# Patient Record
Sex: Female | Born: 1945 | Race: White | Hispanic: No | Marital: Married | Smoking: Former smoker
Health system: Southern US, Community
[De-identification: ages and names within clinical notes are randomized; demographics above are authoritative.]

## PROBLEM LIST (undated history)

## (undated) DIAGNOSIS — E119 Type 2 diabetes mellitus without complications: Secondary | ICD-10-CM

## (undated) DIAGNOSIS — I10 Essential (primary) hypertension: Secondary | ICD-10-CM

## (undated) DIAGNOSIS — Z9289 Personal history of other medical treatment: Secondary | ICD-10-CM

## (undated) DIAGNOSIS — R079 Chest pain, unspecified: Secondary | ICD-10-CM

## (undated) DIAGNOSIS — I447 Left bundle-branch block, unspecified: Secondary | ICD-10-CM

## (undated) DIAGNOSIS — E785 Hyperlipidemia, unspecified: Secondary | ICD-10-CM

## (undated) HISTORY — DX: Essential (primary) hypertension: I10

## (undated) HISTORY — DX: Type 2 diabetes mellitus without complications: E11.9

## (undated) HISTORY — PX: OTHER SURGICAL HISTORY: SHX169

## (undated) HISTORY — DX: Hyperlipidemia, unspecified: E78.5

---

## 2004-11-02 ENCOUNTER — Ambulatory Visit: Payer: Self-pay | Admitting: Internal Medicine

## 2005-05-23 ENCOUNTER — Ambulatory Visit: Payer: Self-pay | Admitting: Internal Medicine

## 2006-04-21 ENCOUNTER — Ambulatory Visit: Payer: Self-pay | Admitting: Gastroenterology

## 2006-05-26 ENCOUNTER — Ambulatory Visit: Payer: Self-pay | Admitting: Internal Medicine

## 2007-05-20 ENCOUNTER — Ambulatory Visit: Payer: Self-pay | Admitting: Cardiovascular Disease

## 2007-05-21 ENCOUNTER — Ambulatory Visit: Payer: Self-pay | Admitting: Cardiovascular Disease

## 2009-07-26 ENCOUNTER — Ambulatory Visit: Payer: Self-pay | Admitting: Internal Medicine

## 2010-08-17 ENCOUNTER — Ambulatory Visit: Payer: Self-pay | Admitting: Internal Medicine

## 2011-07-12 ENCOUNTER — Ambulatory Visit: Payer: Self-pay | Admitting: Dermatology

## 2011-08-20 ENCOUNTER — Ambulatory Visit: Payer: Self-pay | Admitting: Internal Medicine

## 2012-08-25 ENCOUNTER — Ambulatory Visit: Payer: Self-pay | Admitting: Internal Medicine

## 2013-06-23 ENCOUNTER — Encounter: Payer: Self-pay | Admitting: Cardiology

## 2013-06-24 NOTE — Progress Notes (Deleted)
   Patient ID: [name], female    DOB: 1946-04-28, 67 y.o.   MRN: [id number]  HPI    Review of Systems    Physical Exam

## 2013-06-24 NOTE — Progress Notes (Signed)
Patient cancelled, erroneous encounter This encounter was created in error - please disregard.

## 2013-07-09 ENCOUNTER — Other Ambulatory Visit: Payer: Medicare Other | Admitting: Cardiology

## 2013-07-27 ENCOUNTER — Other Ambulatory Visit (INDEPENDENT_AMBULATORY_CARE_PROVIDER_SITE_OTHER): Payer: Medicare Other

## 2013-07-27 ENCOUNTER — Other Ambulatory Visit: Payer: Self-pay

## 2013-07-27 DIAGNOSIS — R0602 Shortness of breath: Secondary | ICD-10-CM

## 2013-07-27 DIAGNOSIS — R079 Chest pain, unspecified: Secondary | ICD-10-CM

## 2013-07-29 ENCOUNTER — Telehealth: Payer: Self-pay | Admitting: Cardiovascular Disease

## 2013-08-02 NOTE — Telephone Encounter (Signed)
Echo Report Faxed 10/23 Dr . Dario Guardian office

## 2013-08-03 ENCOUNTER — Telehealth: Payer: Self-pay

## 2013-08-03 NOTE — Telephone Encounter (Signed)
Message copied by Marilynne Halsted on Tue Aug 03, 2013 11:37 AM ------      Message from: [name]      Created: Mon Aug 02, 2013  5:55 PM       Essentially normal echocardiogram, mild abnormalities       nothing serious ------

## 2013-08-04 NOTE — Telephone Encounter (Signed)
Message copied by Marilynne Halsted on Wed Aug 04, 2013  1:58 PM ------      Message from: Antonieta Iba      Created: Mon Aug 02, 2013  5:55 PM       Essentially normal echocardiogram, mild abnormalities       nothing serious ------

## 2013-08-04 NOTE — Telephone Encounter (Signed)
lmom 

## 2014-09-27 ENCOUNTER — Ambulatory Visit: Payer: Self-pay | Admitting: Internal Medicine

## 2015-01-31 ENCOUNTER — Ambulatory Visit
Admit: 2015-01-31 | Disposition: A | Discharge status: Home / Self Care | Payer: Self-pay | Attending: Internal Medicine | Admitting: Internal Medicine

## 2015-11-06 ENCOUNTER — Encounter: Payer: Medicare HMO | Attending: Surgery | Admitting: Surgery

## 2015-11-06 DIAGNOSIS — G629 Polyneuropathy, unspecified: Secondary | ICD-10-CM | POA: Insufficient documentation

## 2015-11-06 DIAGNOSIS — E785 Hyperlipidemia, unspecified: Secondary | ICD-10-CM | POA: Diagnosis not present

## 2015-11-06 DIAGNOSIS — E559 Vitamin D deficiency, unspecified: Secondary | ICD-10-CM | POA: Diagnosis not present

## 2015-11-06 DIAGNOSIS — I501 Left ventricular failure: Secondary | ICD-10-CM | POA: Diagnosis not present

## 2015-11-06 DIAGNOSIS — E1165 Type 2 diabetes mellitus with hyperglycemia: Secondary | ICD-10-CM | POA: Insufficient documentation

## 2015-11-06 DIAGNOSIS — I10 Essential (primary) hypertension: Secondary | ICD-10-CM | POA: Insufficient documentation

## 2015-11-06 DIAGNOSIS — I87311 Chronic venous hypertension (idiopathic) with ulcer of right lower extremity: Secondary | ICD-10-CM | POA: Insufficient documentation

## 2015-11-06 DIAGNOSIS — Z87891 Personal history of nicotine dependence: Secondary | ICD-10-CM | POA: Insufficient documentation

## 2015-11-06 DIAGNOSIS — E11622 Type 2 diabetes mellitus with other skin ulcer: Secondary | ICD-10-CM | POA: Insufficient documentation

## 2015-11-06 DIAGNOSIS — L97312 Non-pressure chronic ulcer of right ankle with fat layer exposed: Secondary | ICD-10-CM | POA: Insufficient documentation

## 2015-11-07 NOTE — Progress Notes (Signed)
[name], [name] ([id number]) Visit Report for 11/06/2015 Allergy List Details Patient Name: [name], [name]. Date of Service: 11/06/2015 9:30 AM Medical Record Number: [id number] Patient Account Number: [id number] Date of Birth/Sex: 09/01/1946 (70 y.o. Female) Treating RN: Leonard Downing Primary Care Physician: Sherrie Mustache Other Clinician: Referring Physician: Sherrie Mustache Treating Physician/Extender: Rudene Re in Treatment: 0 Allergies Active Allergies no known allergies Allergy Notes Electronic Signature(s) Signed: 11/06/2015 4:43:04 PM By: Lucrezia Starch, RN, Sendra Entered By: Lucrezia Starch RN, Sendra on 11/06/2015 09:24:39 Nierman, [name] ([id number]) -------------------------------------------------------------------------------- Arrival Information Details Patient Name: [name] [name]. Date of Service: 11/06/2015 9:30 AM Medical Record Number: [id number] Patient Account Number: [id number] Date of Birth/Sex: 1946-04-25 (70 y.o. Female) Treating RN: Leonard Downing Primary Care Physician: Sherrie Mustache Other Clinician: Referring Physician: Sherrie Mustache Treating Physician/Extender: Rudene Re in Treatment: 0 Visit Information Patient Arrived: Ambulatory Arrival Time: 09:37 Accompanied By: self Transfer Assistance: None Patient Identification Verified: Yes Secondary Verification Process Yes Completed: Patient Requires Transmission- No Based Precautions: Patient Has Alerts: Yes Patient Alerts: 1/30 ABI (R) 0.93 Electronic Signature(s) Signed: 11/06/2015 4:43:04 PM By: Lucrezia Starch, RN, Sendra Entered By: Lucrezia Starch RN, Sendra on 11/06/2015 10:08:38 Stevenson Clinch ([id number]) -------------------------------------------------------------------------------- Clinic Level of Care Assessment Details Patient Name: [name] [name]. Date of Service: 11/06/2015 9:30 AM Medical Record Number: [id number] Patient Account Number: [id number] Date of Birth/Sex: April 30, 1946 (70  y.o. Female) Treating RN: Leonard Downing Primary Care Physician: Sherrie Mustache Other Clinician: Referring Physician: Sherrie Mustache Treating Physician/Extender: Rudene Re in Treatment: 0 Clinic Level of Care Assessment Items TOOL 1 Quantity Score X - Use when EandM and Procedure is performed on INITIAL visit 1 0 ASSESSMENTS - Nursing Assessment / Reassessment X - General Physical Exam (combine w/ comprehensive assessment (listed just 1 20 below) when performed on new pt. evals) X - Comprehensive Assessment (HX, ROS, Risk Assessments, Wounds Hx, etc.) 1 25 ASSESSMENTS - Wound and Skin Assessment / Reassessment []  - Dermatologic / Skin Assessment (not related to wound area) 0 ASSESSMENTS - Ostomy and/or Continence Assessment and Care []  - Incontinence Assessment and Management 0 []  - Ostomy Care Assessment and Management (repouching, etc.) 0 PROCESS - Coordination of Care X - Simple Patient / Family Education for ongoing care 1 15 []  - Complex (extensive) Patient / Family Education for ongoing care 0 X - Staff obtains Chiropractor, Records, Test Results / Process Orders 1 10 X - Staff telephones HHA, Nursing Homes / Clarify orders / etc 1 10 []  - Routine Transfer to another Facility (non-emergent condition) 0 []  - Routine Hospital Admission (non-emergent condition) 0 []  - New Admissions / Manufacturing engineer / Ordering NPWT, Apligraf, etc. 0 []  - Emergency Hospital Admission (emergent condition) 0 PROCESS - Special Needs []  - Pediatric / Minor Patient Management 0 []  - Isolation Patient Management 0 [name], [name]. ([id number]) []  - Hearing / Language / Visual special needs 0 []  - Assessment of Community assistance (transportation, D/C planning, etc.) 0 []  - Additional assistance / Altered mentation 0 []  - Support Surface(s) Assessment (bed, cushion, seat, etc.) 0 INTERVENTIONS - Miscellaneous []  - External ear exam 0 []  - Patient Transfer (multiple staff / Water quality scientist / Similar devices) 0 []  - Simple Staple / Suture removal (25 or less) 0 []  - Complex Staple / Suture removal (26 or more) 0 []  - Hypo/Hyperglycemic Management (do not check if billed separately) 0 X - Ankle / Brachial Index (ABI) - do not check if billed separately 1 15 Has the patient  been seen at the hospital within the last three years: Yes Total Score: 95 Level Of Care: New/Established - Level 3 Electronic Signature(s) Signed: 11/06/2015 4:43:04 PM By: Lucrezia Starch, RN, Sendra Entered By: Lucrezia Starch RN, Sendra on 11/06/2015 11:34:39 [name], [name] ([id number]) -------------------------------------------------------------------------------- Encounter Discharge Information Details Patient Name: [name] [name]. Date of Service: 11/06/2015 9:30 AM Medical Record Number: [id number] Patient Account Number: [id number] Date of Birth/Sex: April 21, 1946 (70 y.o. Female) Treating RN: Leonard Downing Primary Care Physician: Sherrie Mustache Other Clinician: Referring Physician: Sherrie Mustache Treating Physician/Extender: Rudene Re in Treatment: 0 Encounter Discharge Information Items Discharge Pain Level: 4 Discharge Condition: Stable Ambulatory Status: Ambulatory Discharge Destination: Home Transportation: Private Auto Accompanied By: self Schedule Follow-up Appointment: Yes Medication Reconciliation completed and provided to Patient/Care No Tyneshia Stivers: Provided on Clinical Summary of Care: 11/06/2015 Form Type Recipient Paper Patient SP Electronic Signature(s) Signed: 11/06/2015 4:43:04 PM By: Lucrezia Starch RN, Sendra Previous Signature: 11/06/2015 10:37:37 AM Version By: Gwenlyn Perking Entered By: Lucrezia Starch RN, Sendra on 11/06/2015 10:43:28 [name], [name] ([id number]) -------------------------------------------------------------------------------- Lower Extremity Assessment Details Patient Name: [name] [name]. Date of Service: 11/06/2015 9:30 AM Medical Record Number:  [id number] Patient Account Number: [id number] Date of Birth/Sex: August 24, 1946 (70 y.o. Female) Treating RN: Leonard Downing Primary Care Physician: Sherrie Mustache Other Clinician: Referring Physician: Sherrie Mustache Treating Physician/Extender: Rudene Re in Treatment: 0 Edema Assessment Assessed: [Left: No] [Right: No] Edema: [Left: Ye] [Right: s] Calf Left: Right: Point of Measurement: 36 cm From Medial Instep cm 41 cm Ankle Left: Right: Point of Measurement: 10 cm From Medial Instep cm 22.5 cm Vascular Assessment Pulses: Posterior Tibial Palpable: [Right:Yes] Dorsalis Pedis Palpable: [Right:Yes] Extremity colors, hair growth, and conditions: Extremity Color: [Right:Mottled] Hair Growth on Extremity: [Right:Yes] Temperature of Extremity: [Right:Warm] Capillary Refill: [Right:< 3 seconds] Dependent Rubor: [Right:No] Blanched when Elevated: [Right:No] Lipodermatosclerosis: [Right:No] Blood Pressure: Brachial: [Right:150] Dorsalis Pedis: [Left:Dorsalis Pedis: 140] Ankle: Posterior Tibial: [Left:Posterior Tibial:] [Right:0.93] Toe Nail Assessment Left: Right: Thick: No Discolored: No Deformed: No [name], [name]. ([id number]) Improper Length and Hygiene: No Electronic Signature(s) Signed: 11/06/2015 4:43:04 PM By: Lucrezia Starch, RN, Sendra Entered By: Lucrezia Starch RN, Sendra on 11/06/2015 10:17:34 [name], [name] ([id number]) -------------------------------------------------------------------------------- Multi Wound Chart Details Patient Name: [name] [name]. Date of Service: 11/06/2015 9:30 AM Medical Record Number: [id number] Patient Account Number: [id number] Date of Birth/Sex: Jul 17, 1946 (70 y.o. Female) Treating RN: Leonard Downing Primary Care Physician: Sherrie Mustache Other Clinician: Referring Physician: Sherrie Mustache Treating Physician/Extender: Rudene Re in Treatment: 0 Vital Signs Height(in): 70 Pulse(bpm): 87 Weight(lbs): 188 Blood  Pressure 150/62 (mmHg): Body Mass Index(BMI): 27 Temperature(F): 97.8 Respiratory Rate (breaths/min): Photos: [1:No Photos] [N/[name]:N/[name]] Wound Location: [1:Lower Leg - Lateral] [N/[name]:N/[name]] Wounding Event: [1:Gradually Appeared] [N/[name]:N/[name]] Primary Etiology: [1:Venous Leg Ulcer] [N/[name]:N/[name]] Secondary Etiology: [1:Diabetic Wound/Ulcer of the Lower Extremity] [N/[name]:N/[name]] Comorbid History: [1:Hypertension, Type II Diabetes, Neuropathy] [N/[name]:N/[name]] Date Acquired: [1:04/07/2015] [N/[name]:N/[name]] Weeks of Treatment: [1:0] [N/[name]:N/[name]] Wound Status: [1:Open] [N/[name]:N/[name]] Measurements L x W x D 1x0.5x0.1 [N/[name]:N/[name]] (cm) Area (cm) : [1:0.393] [N/[name]:N/[name]] Volume (cm) : [1:0.039] [N/[name]:N/[name]] Classification: [1:Partial Thickness] [N/[name]:N/[name]] HBO Classification: [1:Grade 1] [N/[name]:N/[name]] Exudate Amount: [1:Medium] [N/[name]:N/[name]] Exudate Type: [1:Serosanguineous] [N/[name]:N/[name]] Exudate Color: [1:red, brown] [N/[name]:N/[name]] Wound Margin: [1:Flat and Intact] [N/[name]:N/[name]] Granulation Amount: [1:Small (1-33%)] [N/[name]:N/[name]] Granulation Quality: [1:Red] [N/[name]:N/[name]] Necrotic Amount: [1:Large (67-100%)] [N/[name]:N/[name]] Exposed Structures: [1:Fascia: No Fat: No Tendon: No Muscle: No Joint: No Bone: No] [N/[name]:N/[name]] Limited to Skin Breakdown Epithelialization: None N/[name] N/[name] Periwound Skin Texture: Edema: No N/[name] N/[name] Excoriation: No Induration: No Callus: No Crepitus: No Fluctuance:  No Friable: No Rash: No Scarring: No Periwound Skin Moist: Yes N/[name] N/[name] Moisture: Maceration: No Dry/Scaly: No Periwound Skin Color: Ecchymosis: Yes N/[name] N/[name] Erythema: Yes Atrophie Blanche: No Cyanosis: No Hemosiderin Staining: No Mottled: No Pallor: No Rubor: No Erythema Location: Circumferential N/[name] N/[name] Tenderness on Yes N/[name] N/[name] Palpation: Wound Preparation: Ulcer [name]: N/[name] N/[name] Rinsed/Irrigated with Saline Topical Anesthetic Applied: Other: lidocaine 4% Treatment Notes Electronic Signature(s) Signed: 11/06/2015 4:43:04 PM By: Lucrezia Starch, RN, Sendra Entered By:  Lucrezia Starch RN, Sendra on 11/06/2015 10:13:31 [name], [name] ([id number]) -------------------------------------------------------------------------------- Multi-Disciplinary Care Plan Details Patient Name: [name] [name]. Date of Service: 11/06/2015 9:30 AM Medical Record Number: [id number] Patient Account Number: [id number] Date of Birth/Sex: 1946/09/15 (70 y.o. Female) Treating RN: Leonard Downing Primary Care Physician: Sherrie Mustache Other Clinician: Referring Physician: Sherrie Mustache Treating Physician/Extender: Rudene Re in Treatment: 0 Active Inactive Orientation to the Wound Care Program Nursing Diagnoses: Knowledge deficit related to the wound healing center program Goals: Patient/caregiver will verbalize understanding of the Wound Healing Center Program Date Initiated: 11/06/2015 Goal Status: Active Interventions: Provide education on orientation to the wound center Notes: Venous Leg Ulcer Nursing Diagnoses: Potential for venous Insuffiency (use before diagnosis confirmed) Goals: Non-invasive venous studies are completed as ordered Date Initiated: 11/06/2015 Goal Status: Active Patient will maintain optimal edema control Date Initiated: 11/06/2015 Goal Status: Active Patient/caregiver will verbalize understanding of disease process and disease management Date Initiated: 11/06/2015 Goal Status: Active Verify adequate tissue perfusion prior to therapeutic compression application Date Initiated: 11/06/2015 Goal Status: Active Interventions: Assess peripheral edema status every visit. Compression as ordered [name], [name]. ([id number]) Provide education on venous insufficiency Treatment Activities: Non-invasive vascular studies : 11/06/2015 Test ordered outside of clinic : 11/06/2015 Therapeutic compression applied : 11/06/2015 Notes: Wound/Skin Impairment Nursing Diagnoses: Impaired tissue integrity Goals: Patient/caregiver will verbalize understanding of  skin care regimen Date Initiated: 11/06/2015 Goal Status: Active Ulcer/skin breakdown will have [name] volume reduction of 30% by week 4 Date Initiated: 11/06/2015 Goal Status: Active Ulcer/skin breakdown will have [name] volume reduction of 50% by week 8 Date Initiated: 11/06/2015 Goal Status: Active Ulcer/skin breakdown will have [name] volume reduction of 80% by week 12 Date Initiated: 11/06/2015 Goal Status: Active Ulcer/skin breakdown will heal within 14 weeks Date Initiated: 11/06/2015 Goal Status: Active Interventions: Assess patient/caregiver ability to perform ulcer/skin care regimen upon admission and as needed Assess ulceration(s) every visit Provide education on ulcer and skin care Notes: Electronic Signature(s) Signed: 11/06/2015 4:43:04 PM By: Lucrezia Starch, RN, Sendra Entered By: Lucrezia Starch RN, Sendra on 11/06/2015 10:13:17 [name], [name] ([id number]) -------------------------------------------------------------------------------- Pain Assessment Details Patient Name: [name] [name]. Date of Service: 11/06/2015 9:30 AM Medical Record Number: [id number] Patient Account Number: [id number] Date of Birth/Sex: 1945/11/03 (70 y.o. Female) Treating RN: Leonard Downing Primary Care Physician: Sherrie Mustache Other Clinician: Referring Physician: Sherrie Mustache Treating Physician/Extender: Rudene Re in Treatment: 0 Active Problems Location of Pain Severity and Description of Pain Patient Has Paino Yes Site Locations Pain Location: Pain in Ulcers With Dressing Change: Yes Duration of the Pain. Constant / Intermittento Intermittent Rate the pain. Current Pain Level: 10 Pain Management and Medication Current Pain Management: Electronic Signature(s) Signed: 11/06/2015 4:43:04 PM By: Lucrezia Starch, RN, Sendra Entered By: Lucrezia Starch RN, Sendra on 11/06/2015 09:37:48 [name], [name] ([id number]) -------------------------------------------------------------------------------- Patient/Caregiver  Education Details Patient Name: [name] [name]. Date of Service: 11/06/2015 9:30 AM Medical Record Number: [id number] Patient Account Number: [id number] Date of Birth/Gender: 12/08/1945 (70 y.o. Female) Treating RN: Leonard Downing Primary Care Physician: Dario Guardian,  [facility] Other Clinician: Referring Physician: Sherrie Mustache Treating Physician/Extender: Rudene Re in Treatment: 0 Education Assessment Education Provided To: Patient Education Topics Provided Venous: Handouts: Controlling Swelling with Compression Stockings , Other: AVV venous duplex Methods: Explain/Verbal Responses: State content correctly Welcome To The Wound Care Center: Handouts: Welcome To The Wound Care Center Methods: Explain/Verbal Responses: State content correctly Wound/Skin Impairment: Handouts: Caring for Your Ulcer, Skin Care Do's and Dont's Methods: Explain/Verbal Responses: State content correctly Electronic Signature(s) Signed: 11/06/2015 4:43:04 PM By: Lucrezia Starch, RN, Sendra Entered By: Lucrezia Starch RN, Sendra on 11/06/2015 10:43:59 [name], [name] ([id number]) -------------------------------------------------------------------------------- Wound Assessment Details Patient Name: [name] [name]. Date of Service: 11/06/2015 9:30 AM Medical Record Number: [id number] Patient Account Number: [id number] Date of Birth/Sex: 12-01-1945 (70 y.o. Female) Treating RN: Leonard Downing Primary Care Physician: Sherrie Mustache Other Clinician: Referring Physician: Sherrie Mustache Treating Physician/Extender: Rudene Re in Treatment: 0 Wound Status Wound Number: 1 Primary Etiology: Venous Leg Ulcer Wound Location: Lower Leg - Lateral Secondary Diabetic Wound/Ulcer of the Lower Etiology: Extremity Wounding Event: Gradually Appeared Wound Status: Open Date Acquired: 04/07/2015 Comorbid Hypertension, Type II Diabetes, Weeks Of Treatment: 0 History: Neuropathy Clustered Wound: No Photos Photo Uploaded  By: Lucrezia Starch, RN, Rosalio Macadamia on 11/06/2015 12:45:09 Wound Measurements Length: (cm) 1 Width: (cm) 0.5 Depth: (cm) 0.1 Area: (cm) 0.393 Volume: (cm) 0.039 % Reduction in Area: % Reduction in Volume: Epithelialization: None Tunneling: No Undermining: No Wound Description Classification: Partial Thickness Foul Odor Af Diabetic Severity (Wagner): Grade 1 Wound Margin: Flat and Intact Exudate Amount: Medium Exudate Type: Serosanguineous Exudate Color: red, brown [name]: No Wound Bed Granulation Amount: Small (1-33%) Exposed Structure Granulation Quality: Red Fascia Exposed: No Necrotic Amount: Large (67-100%) Fat Layer Exposed: No Kerins, [name]. ([id number]) Necrotic Quality: Adherent Slough Tendon Exposed: No Muscle Exposed: No Joint Exposed: No Bone Exposed: No Limited to Skin Breakdown Periwound Skin Texture Texture Color No Abnormalities Noted: No No Abnormalities Noted: No Callus: No Atrophie Blanche: No Crepitus: No Cyanosis: No Excoriation: No Ecchymosis: Yes Fluctuance: No Erythema: Yes Friable: No Erythema Location: Circumferential Induration: No Hemosiderin Staining: No Localized Edema: No Mottled: No Rash: No Pallor: No Scarring: No Rubor: No Moisture Temperature / Pain No Abnormalities Noted: No Tenderness on Palpation: Yes Dry / Scaly: No Maceration: No Moist: Yes Wound Preparation Ulcer [name]: Rinsed/Irrigated with Saline Topical Anesthetic Applied: Other: lidocaine 4%, Treatment Notes Wound #1 (Lateral Lower Leg) 1. Cleansed with: Clean wound with Normal Saline 2. Anesthetic Topical Lidocaine 4% cream to wound bed prior to debridement 4. Dressing Applied: Aquacel Ag 5. Secondary Dressing Applied Bordered Foam Dressing Notes AVV consult, obtain dermatology notes from Dr Adolphus Birchwood Electronic Signature(s) Signed: 11/06/2015 4:43:04 PM By: Lucrezia Starch RN, Sendra Entered By: Lucrezia Starch RN, Sendra on 11/06/2015 09:56:25 Ding,  [name] ([id number]) -------------------------------------------------------------------------------- Vitals Details Patient Name: [name] [name]. Date of Service: 11/06/2015 9:30 AM Medical Record Number: [id number] Patient Account Number: [id number] Date of Birth/Sex: 1945-12-20 (70 y.o. Female) Treating RN: Leonard Downing Primary Care Physician: Sherrie Mustache Other Clinician: Referring Physician: Sherrie Mustache Treating Physician/Extender: Rudene Re in Treatment: 0 Vital Signs Time Taken: 09:38 Temperature (F): 97.8 Height (in): 70 Pulse (bpm): 87 Source: Stated Blood Pressure (mmHg): 150/62 Weight (lbs): 188 Reference Range: 80 - 120 mg / dl Source: Stated Body Mass Index (BMI): 27 Notes Hemoglobin A1c checked on Friday, waiting on results Electronic Signature(s) Signed: 11/06/2015 4:43:04 PM By: Lucrezia Starch RN, Sendra Entered By: Lucrezia Starch RN, Sendra on 11/06/2015 09:41:04

## 2015-11-07 NOTE — Progress Notes (Addendum)
SHAKEYA, KERKMAN ([id number]) Visit Report for 11/06/2015 Chief Complaint Document Details Patient Name: [name], [name] Date of Service: 11/06/2015 9:30 AM Medical Record Number: [id number] Patient Account Number: [id number] Date of Birth/Sex: December 30, 1945 (70 y.o. Female) Treating RN: Leonard Downing Primary Care Physician: Sherrie Mustache Other Clinician: Referring Physician: Sherrie Mustache Treating Physician/Extender: Rudene Re in Treatment: 0 Information Obtained from: Patient Chief Complaint Patients presents for treatment of an open diabetic ulcer to the right lateral lower third of the leg which she's had for about 6 months Electronic Signature(s) Signed: 11/06/2015 10:53:32 AM By: Evlyn Kanner MD, FACS Entered By: Evlyn Kanner on 11/06/2015 10:53:32 [name], [name] ([id number]) -------------------------------------------------------------------------------- Debridement Details Patient Name: [name] A. Date of Service: 11/06/2015 9:30 AM Medical Record Number: [id number] Patient Account Number: [id number] Date of Birth/Sex: 03/01/1946 (70 y.o. Female) Treating RN: Leonard Downing Primary Care Physician: Sherrie Mustache Other Clinician: Referring Physician: Sherrie Mustache Treating Physician/Extender: Rudene Re in Treatment: 0 Debridement Performed for Wound #1 Lateral Lower Leg Assessment: Performed By: Physician Evlyn Kanner, MD Debridement: Debridement Pre-procedure Yes Verification/Time Out Taken: Start Time: 10:23 Pain Control: Lidocaine 4% Topical Solution Level: Skin/Subcutaneous Tissue Total Area Debrided (L x 1 (cm) x 0.5 (cm) = 0.5 (cm) W): Tissue and other Viable, Non-Viable, Fibrin/Slough, Skin, Subcutaneous material debrided: Instrument: Curette Bleeding: None End Time: 10:24 Procedural Pain: 4 Post Procedural Pain: 4 Response to Treatment: Procedure was tolerated well Post Debridement Measurements of Total Wound Length: (cm)  1 Width: (cm) 0.5 Depth: (cm) 0.1 Volume: (cm) 0.039 Post Procedure Diagnosis Same as Pre-procedure Electronic Signature(s) Signed: 11/06/2015 10:53:10 AM By: Evlyn Kanner MD, FACS Signed: 11/06/2015 4:43:04 PM By: Lucrezia Starch RN, Sendra Entered By: Evlyn Kanner on 11/06/2015 10:53:09 [name], [name] Kitchen ([id number]) -------------------------------------------------------------------------------- HPI Details Patient Name: [name] A. Date of Service: 11/06/2015 9:30 AM Medical Record Number: [id number] Patient Account Number: [id number] Date of Birth/Sex: 1946-03-28 (70 y.o. Female) Treating RN: Leonard Downing Primary Care Physician: Sherrie Mustache Other Clinician: Referring Physician: Sherrie Mustache Treating Physician/Extender: Rudene Re in Treatment: 0 History of Present Illness Location: right lateral lower leg Quality: Patient reports experiencing a dull pain to affected area(s). Severity: Patient states wound are getting worse. Duration: Patient has had the wound for > 6 months prior to seeking treatment at the wound center Timing: Pain in wound is Intermittent (comes and goes Context: The wound appeared gradually over time Modifying Factors: Other treatment(s) tried include: her dermatologist Dr. Adolphus Birchwood has been treating her with local care and some pentoxifylline Associated Signs and Symptoms: Patient reports having increase swelling. HPI Description: 70 year old patient with known history of uncontrolled type 2 diabetes mellitus also has a small ulcer on the right ankle and was referred to as by her PCP. 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 She has given up smoking over 15 years ago. the patient has been under the care of her dermatologist Dr. Adolphus Birchwood who has done a biopsy for her sometime in October  and said there was no cancer. We will try and get these reports from his office and the other workup and treatment he's been doing can be noted. Addendum: received the pathology report from Dr. 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 This atypia was considered reactive. Electronic Signature(s)  Signed: 11/06/2015 4:46:13 PM By: Evlyn Kanner MD, FACS Previous Signature: 11/06/2015 10:55:13 AM Version By: Evlyn Kanner MD, FACS Previous Signature: 11/06/2015 9:54:29 AM Version By: Evlyn Kanner MD, FACS Previous Signature: 11/06/2015 9:47:16 AM Version By: Evlyn Kanner MD, FACS Entered By: Evlyn Kanner on 11/06/2015 16:46:13 [name], [name] ([id number]) -------------------------------------------------------------------------------- Physical Exam Details Patient Name: [name] A. Date of Service: 11/06/2015 9:30 AM Medical Record Number: [id number] Patient Account Number: [id number] Date of Birth/Sex: 05-08-1946 (70 y.o. Female) Treating RN: Leonard Downing Primary Care Physician: Sherrie Mustache Other Clinician: Referring Physician: Sherrie Mustache Treating Physician/Extender: Rudene Re in Treatment: 0 Constitutional . Pulse regular. Respirations normal and unlabored. Afebrile. . Eyes Nonicteric. Reactive to light. Ears, Nose, Mouth, and Throat Lips, teeth, and gums WNL.[name] Kitchen Moist mucosa without lesions. Neck supple and nontender. No palpable supraclavicular or cervical adenopathy. Normal sized without goiter. Respiratory WNL. No retractions.. Cardiovascular Pedal Pulses WNL. ABI on the right lower extremity was 0.93. No clubbing, cyanosis or edema. Gastrointestinal (GI) Abdomen without masses or tenderness.. No liver or spleen enlargement or tenderness.. Lymphatic No adneopathy. No adenopathy. No  adenopathy. Musculoskeletal Adexa without tenderness or enlargement.. Digits and nails w/o clubbing, cyanosis, infection, petechiae, ischemia, or inflammatory conditions.. Integumentary (Hair, Skin) No suspicious lesions. No crepitus or fluctuance. No peri-wound warmth or erythema. No masses.[name] Kitchen Psychiatric Judgement and insight Intact.. No evidence of depression, anxiety, or agitation.. Notes she has an ulcerated area on the right lateral lower extremity about a few inches above her lateral malleolus. She has some raised skin and this appears to be a chronic skin condition which has previously been biopsied. Electronic Signature(s) Signed: 11/06/2015 10:56:11 AM By: Evlyn Kanner MD, FACS Entered By: Evlyn Kanner on 11/06/2015 10:56:11 [name], [name] ([id number]) -------------------------------------------------------------------------------- Physician Orders Details Patient Name: [name] A. Date of Service: 11/06/2015 9:30 AM Medical Record Number: [id number] Patient Account Number: [id number] Date of Birth/Sex: [birth year] (70 y.o. Female) Treating RN: Leonard Downing Primary Care Physician: Sherrie Mustache Other Clinician: Referring Physician: Sherrie Mustache Treating Physician/Extender: Rudene Re in Treatment: 0 Verbal / Phone Orders: Yes Clinician: Leonard Downing Read Back and Verified: No Diagnosis Coding Wound Cleansing Wound #1 Lateral Lower Leg o Cleanse wound with mild soap and water - antibacterial soap o May Shower, gently pat wound dry prior to applying new dressing. Primary Wound Dressing Wound #1 Lateral Lower Leg o Aquacel Ag Secondary Dressing Wound #1 Lateral Lower Leg o Boardered Foam Dressing Dressing Change Frequency Wound #1 Lateral Lower Leg o Change dressing every other day. Follow-up Appointments Wound #1 Lateral Lower Leg o Return Appointment in 1 week. Edema Control Wound #1 Lateral Lower Leg o Support Garment 20-30  mm/Hg pressure to: - patient to purchase Consults o Vascular - venous duplex at [facility] Notes obtain notes from Dr Adolphus Birchwood, dermatology Electronic Signature(s) Signed: 11/06/2015 4:26:31 PM By: Evlyn Kanner MD, FACS Signed: 11/06/2015 4:43:04 PM By: Lucrezia Starch RN, Laurence Ferrari, Alverta [name] Kitchen ([id number]) Entered By: Lucrezia Starch RN, Sendra on 11/06/2015 10:28:36 Mottram, [name] ([id number]) -------------------------------------------------------------------------------- Problem List Details Patient Name: [name] A. Date of Service: 11/06/2015 9:30 AM Medical Record Number: [id number] Patient Account Number: [id number] Date of Birth/Sex: [birth year] (70 y.o. Female) Treating RN: Leonard Downing Primary Care Physician: Sherrie Mustache Other Clinician: Referring Physician: Sherrie Mustache Treating Physician/Extender: Rudene Re in Treatment: 0 Active Problems ICD-10 Encounter Code Description Active Date Diagnosis E11.622 Type 2 diabetes mellitus with other skin ulcer 11/06/2015 Yes I87.311 Chronic venous hypertension (idiopathic) with ulcer of 11/06/2015 Yes right  lower extremity L97.312 Non-pressure chronic ulcer of right ankle with fat layer 11/06/2015 Yes exposed Inactive Problems Resolved Problems Electronic Signature(s) Signed: 11/06/2015 10:52:53 AM By: Evlyn Kanner MD, FACS Entered By: Evlyn Kanner on 11/06/2015 10:52:52 [place], [name] ([id number]) -------------------------------------------------------------------------------- Progress Note Details Patient Name: [name] A. Date of Service: 11/06/2015 9:30 AM Medical Record Number: [id number] Patient Account Number: [id number] Date of Birth/Sex: 1945/11/17 (70 y.o. Female) Treating RN: Leonard Downing Primary Care Physician: Sherrie Mustache Other Clinician: Referring Physician: Sherrie Mustache Treating Physician/Extender: Rudene Re in Treatment: 0 Subjective Chief Complaint Information obtained from  Patient Patients presents for treatment of an open diabetic ulcer to the right lateral lower third of the leg which she's had for about 6 months History of Present Illness (HPI) The following HPI elements were documented for the patient's wound: Location: right lateral lower leg Quality: Patient reports experiencing a dull pain to affected area(s). Severity: Patient states wound are getting worse. Duration: Patient has had the wound for > 6 months prior to seeking treatment at the wound center Timing: Pain in wound is Intermittent (comes and goes Context: The wound appeared gradually over time Modifying Factors: Other treatment(s) tried include: her dermatologist Dr. Adolphus Birchwood has been treating her with local care and some pentoxifylline Associated Signs and Symptoms: Patient reports having increase swelling. 70 year old patient with known history of uncontrolled type 2 diabetes mellitus also has a small ulcer on the right ankle and was referred to as by her PCP. 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 She has given up smoking over 15 years ago. the patient has been under the care of her dermatologist Dr. Adolphus Birchwood who has done a biopsy for her sometime in October and said there was no cancer. We will try and get these reports from his office and the other workup and treatment he's been doing can be noted. Addendum: received the pathology report from Dr. 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 This atypia was considered reactive. Patient History Information obtained from Patient, Chart. Allergies no known allergies Spickard, Areli A. ([id number]) Family History Cancer - Siblings,  Diabetes - Mother, Father, Siblings, Heart Disease - Father, Hypertension - Father, Stroke - Father, Mother, No family history of Kidney Disease, Lung Disease, Seizures, Thyroid Problems, Tuberculosis. Social History Former smoker - 10-15 years quit, Marital Status - Married, Alcohol Use - Never, Drug Use - No History, Caffeine Use - Daily. Medical History Eyes Denies history of Cataracts, Glaucoma, Optic Neuritis Ear/Nose/Mouth/Throat Denies history of Chronic sinus problems/congestion, Middle ear problems Hematologic/Lymphatic Denies history of Anemia, Hemophilia, Human Immunodeficiency Virus, Lymphedema, Sickle Cell Disease Respiratory Denies history of Aspiration, Asthma, Chronic Obstructive Pulmonary Disease (COPD), Pneumothorax, Sleep Apnea, Tuberculosis Cardiovascular Patient has history of Hypertension Denies history of Angina, Arrhythmia, Congestive Heart Failure, Coronary Artery Disease, Deep Vein Thrombosis, Hypotension, Myocardial Infarction, Peripheral Arterial Disease, Peripheral Venous Disease, Phlebitis, Vasculitis Gastrointestinal Denies history of Cirrhosis , Colitis, Crohn s, Hepatitis A, Hepatitis B, Hepatitis C Endocrine Patient has history of Type II Diabetes Genitourinary Denies history of End Stage Renal Disease Immunological Denies history of Lupus Erythematosus, Raynaud s, Scleroderma Integumentary (Skin) Denies history of History of Burn, History of pressure wounds Musculoskeletal Denies history of Gout, Rheumatoid Arthritis, Osteoarthritis, Osteomyelitis Neurologic Patient has history of  Neuropathy - peripheral Denies history of Dementia, Quadriplegia, Paraplegia, Seizure Disorder Oncologic Denies history of Received Chemotherapy, Received Radiation Psychiatric Denies history of Anorexia/bulimia, Confinement Anxiety Patient is treated with Oral Agents. Blood sugar is tested. Medical And Surgical History Notes Constitutional Symptoms (General  Health) vitamin d deficiency Respiratory nodule of lung [name], [name] ([id number]) Cardiovascular dyspnea 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, AVV Musculoskeletal arthritis Review of Systems (ROS) Eyes The patient has no complaints or symptoms. Ear/Nose/Mouth/Throat The patient has no complaints or symptoms. Hematologic/Lymphatic The patient has no complaints or symptoms. Respiratory The patient has no complaints or symptoms. Cardiovascular Complains or has symptoms of Chest pain - occassionally with Nitrogylcerin if needed. Gastrointestinal The patient has no complaints or symptoms. Endocrine The patient has no complaints or symptoms. Genitourinary The patient has no complaints or symptoms. Immunological The patient has no complaints or symptoms. Integumentary (Skin) Complains or has symptoms of Wounds - 1. Musculoskeletal The patient has no complaints or symptoms. Neurologic The patient has no complaints or symptoms. Oncologic The patient has no complaints or symptoms. Psychiatric The patient has no complaints or symptoms. Objective Constitutional Pulse regular. Respirations normal and unlabored. Afebrile. Vitals Time Taken: 9:38 AM, Height: 70 in, Source: Stated, Weight: 188 lbs, Source: Stated, BMI: 27, Temperature: 97.8 F, Pulse: 87 bpm, Blood Pressure: 150/62 mmHg. General Notes: Hemoglobin A1c checked on Friday, waiting on results [name], [name] ([id number]) Eyes Nonicteric. Reactive to light. Ears, Nose, Mouth, and Throat Lips, teeth, and gums WNL.[name] Kitchen Moist mucosa without lesions. Neck supple and nontender. No palpable supraclavicular or cervical adenopathy. Normal sized without goiter. Respiratory WNL. No retractions.. Cardiovascular Pedal Pulses WNL. ABI on the right lower extremity was 0.93. No clubbing, cyanosis or edema. Gastrointestinal (GI) Abdomen without masses or tenderness.. No liver or spleen enlargement or  tenderness.. Lymphatic No adneopathy. No adenopathy. No adenopathy. Musculoskeletal Adexa without tenderness or enlargement.. Digits and nails w/o clubbing, cyanosis, infection, petechiae, ischemia, or inflammatory conditions.[name] Kitchen Psychiatric Judgement and insight Intact.. No evidence of depression, anxiety, or agitation.. General Notes: she has an ulcerated area on the right lateral lower extremity about a few inches above her lateral malleolus. She has some raised skin and this appears to be a chronic skin condition which has previously been biopsied. Integumentary (Hair, Skin) No suspicious lesions. No crepitus or fluctuance. No peri-wound warmth or erythema. No masses.. Wound #1 status is Open. Original cause of wound was Gradually Appeared. The wound is located on the Lateral Lower Leg. The wound measures 1cm length x 0.5cm width x 0.1cm depth; 0.393cm^2 area and 0.039cm^3 volume. The wound is limited to skin breakdown. There is no tunneling or undermining noted. There is a medium amount of serosanguineous drainage noted. The wound margin is flat and intact. There is small (1-33%) red granulation within the wound bed. There is a large (67-100%) amount of necrotic tissue within the wound bed including Adherent Slough. The periwound skin appearance exhibited: Moist, Ecchymosis, Erythema. The periwound skin appearance did not exhibit: Callus, Crepitus, Excoriation, Fluctuance, Friable, Induration, Localized Edema, Rash, Scarring, Dry/Scaly, Maceration, Atrophie Blanche, Cyanosis, Hemosiderin Staining, Mottled, Pallor, Rubor. The surrounding wound skin color is noted with erythema which is circumferential. The periwound has tenderness on palpation. 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 In the past she has had a vascular workup done and possibly some intervention on the left lower extremity. She does have some stigmata of venous hypertension and varicose veins and I have recommended we get the venous duplex of her right lower extremity. I have also asked for the reports from her dermatology office including the pathology report. Local infiltrate with Aquacel Ag and a bordered foam and await her vascular workup prior to giving her compression. She has been urged to use compression stockings which she will get over-the-counter. Procedures Wound #1 Wound #1 is a Venous Leg Ulcer located on the Lateral Lower Leg . There was a Skin/Subcutaneous Tissue Debridement ([id number]) debridement with total area of 0.5 sq cm performed by Evlyn Kanner, MD. with the following instrument(s): Curette to remove Viable and Non-Viable tissue/material including Fibrin/Slough, Skin, and Subcutaneous after achieving pain control using Lidocaine 4% Topical Solution. A time out was conducted prior to the start of the procedure. There was no bleeding. The procedure was tolerated well with a pain level of 4 throughout and a pain level of 4 following the procedure. Post Debridement Measurements: 1cm length x 0.5cm width x 0.1cm depth; 0.039cm^3 volume. Post procedure Diagnosis Wound #1: Same as Pre-Procedure Plan Wound Cleansing: Wound #1 Lateral Lower Leg: [name], [name] ([id number]) Cleanse wound with mild soap and water - antibacterial soap May Shower, gently pat wound dry prior to applying new dressing. Primary Wound Dressing: Wound #1 Lateral Lower Leg: Aquacel Ag Secondary Dressing: Wound #1 Lateral Lower Leg: Boardered Foam Dressing Dressing Change Frequency: Wound #1 Lateral  Lower Leg: Change dressing every other day. Follow-up Appointments: Wound #1 Lateral Lower Leg: Return Appointment in 1 week. Edema Control: Wound #1 Lateral Lower Leg: Support Garment 20-30 mm/Hg pressure to: - patient to purchase Consults ordered were: Vascular - venous duplex at AVV General Notes: obtain notes from Dr Adolphus Birchwood, dermatology 70 year old patient whose had chronic dermatitis problems and has been treated by her dermatologist for over 6 months for this particular ulceration on her right lower extremity. In the past she has had a vascular workup done and possibly some intervention on the left lower extremity. She does have some stigmata of venous hypertension and varicose veins and I have recommended we get the venous duplex of her right lower extremity. I have also asked for the reports from her dermatology office including the pathology report. Local infiltrate with Aquacel Ag and a bordered foam and await her vascular workup prior to giving her compression. She has been urged to use compression stockings which she will get over-the-counter. Electronic Signature(s) Signed: 11/06/2015 4:46:27 PM By: Evlyn Kanner MD, FACS Previous Signature: 11/06/2015 10:57:58 AM Version By: Evlyn Kanner MD, FACS Entered By: Evlyn Kanner on 11/06/2015 16:46:27 [name], [name] ([id number]) -------------------------------------------------------------------------------- ROS/PFSH Details Patient Name: [name] A. Date of Service: 11/06/2015 9:30 AM Medical Record Number: [id number] Patient Account Number: [id number] Date of Birth/Sex: 26-Feb-1946 (70 y.o. Female) Treating RN: Leonard Downing Primary Care Physician: Sherrie Mustache Other Clinician: Referring Physician: Sherrie Mustache Treating Physician/Extender: Rudene Re in Treatment: 0 Information Obtained From Patient Chart Wound History Cardiovascular Complaints and Symptoms: Positive for: Chest pain - occassionally  with Nitrogylcerin if needed Medical History: Positive for: Hypertension Negative for: Angina; Arrhythmia; Congestive Heart Failure; Coronary Artery Disease; Deep Vein Thrombosis; Hypotension; Myocardial Infarction; Peripheral Arterial Disease; Peripheral Venous Disease; Phlebitis; Vasculitis  Past Medical History Notes: dyspnea 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, AVV Integumentary (Skin) Complaints and Symptoms: Positive for: Wounds - 1 Medical History: Negative for: History of Burn; History of pressure wounds Constitutional Symptoms (General Health) Medical History: Past Medical History Notes: vitamin d deficiency Eyes Complaints and Symptoms: No Complaints or Symptoms Medical History: Negative for: Cataracts; Glaucoma; Optic Neuritis Ear/Nose/Mouth/Throat [name], [name] ([id number]) Complaints and Symptoms: No Complaints or Symptoms Medical History: Negative for: Chronic sinus problems/congestion; Middle ear problems Hematologic/Lymphatic Complaints and Symptoms: No Complaints or Symptoms Medical History: Negative for: Anemia; Hemophilia; Human Immunodeficiency Virus; Lymphedema; Sickle Cell Disease Respiratory Complaints and Symptoms: No Complaints or Symptoms Medical History: Negative for: Aspiration; Asthma; Chronic Obstructive Pulmonary Disease (COPD); Pneumothorax; Sleep Apnea; Tuberculosis Past Medical History Notes: nodule of lung Gastrointestinal Complaints and Symptoms: No Complaints or Symptoms Medical History: Negative for: Cirrhosis ; Colitis; Crohnos; Hepatitis A; Hepatitis B; Hepatitis C Endocrine Complaints and Symptoms: No Complaints or Symptoms Medical History: Positive for: Type II Diabetes Time with diabetes: 10 years Treated with: Oral agents Blood sugar tested every day: Yes Tested : Genitourinary Complaints and Symptoms: No Complaints or Symptoms Medical History: [name], [name] ([id number]) Negative for: End Stage Renal  Disease Immunological Complaints and Symptoms: No Complaints or Symptoms Medical History: Negative for: Lupus Erythematosus; Raynaudos; Scleroderma Musculoskeletal Complaints and Symptoms: No Complaints or Symptoms Medical History: Negative for: Gout; Rheumatoid Arthritis; Osteoarthritis; Osteomyelitis Past Medical History Notes: arthritis Neurologic Complaints and Symptoms: No Complaints or Symptoms Medical History: Positive for: Neuropathy - peripheral Negative for: Dementia; Quadriplegia; Paraplegia; Seizure Disorder Oncologic Complaints and Symptoms: No Complaints or Symptoms Medical History: Negative for: Received Chemotherapy; Received Radiation Psychiatric Complaints and Symptoms: No Complaints or Symptoms Medical History: Negative for: Anorexia/bulimia; Confinement Anxiety Immunizations Immunization Notes: plan on flu shot Family and Social History [name], [name] ([id number]) Cancer: Yes - Siblings; Diabetes: Yes - Mother, Father, Siblings; Heart Disease: Yes - Father; Hypertension: Yes - Father; Kidney Disease: No; Lung Disease: No; Seizures: No; Stroke: Yes - Father, Mother; Thyroid Problems: No; Tuberculosis: No; Former smoker - 10-15 years quit; Marital Status - Married; Alcohol Use: Never; Drug Use: No History; Caffeine Use: Daily; Financial Concerns: No; Food, Clothing or Shelter Needs: No; Support System Lacking: No; Transportation Concerns: No; Advanced Directives: No; Living Will: No; Medical Power of Attorney: No Psychologist, prison and probation services) Signed: 11/06/2015 4:26:31 PM By: Evlyn Kanner MD, FACS Signed: 11/06/2015 4:43:04 PM By: Lucrezia Starch RN, Sendra Previous Signature: 11/06/2015 9:46:19 AM Version By: Evlyn Kanner MD, FACS Entered By: Lucrezia Starch RN, Sendra on 11/06/2015 09:48:05 [name], [name] ([id number]) -------------------------------------------------------------------------------- SuperBill Details Patient Name: [name] A. Date of Service:  11/06/2015 Medical Record Number: [id number] Patient Account Number: [id number] Date of Birth/Sex: [birth year] (70 y.o. 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 layer exposed Facility Procedures CPT4 Code Description: 24401027 99213 - WOUND CARE VISIT-LEV 3 EST PT Modifier: Quantity: 1 CPT4 Code Description: 25366440 11042 - DEB SUBQ TISSUE 20 SQ CM/< ICD-10 Description Diagnosis E11.622 Type 2 diabetes mellitus with other skin ulcer I87.311 Chronic venous hypertension (idiopathic) with ulcer L97.312 Non-pressure chronic ulcer of right  ankle with fat l Modifier: of right lowe ayer exposed Quantity: 1 r extremity  Physician Procedures CPT4 Code Description: 0454098 99204 - WC PHYS LEVEL 4 - NEW PT ICD-10 Description Diagnosis E11.622 Type 2 diabetes mellitus with other skin ulcer I87.311 Chronic venous hypertension (idiopathic) with ulcer L97.312 Non-pressure chronic ulcer of right  ankle with fat l Modifier: of right lower ayer exposed Quantity: 1 extremity CPT4 Code Description: 1191478 11042 - WC PHYS SUBQ TISS 20 SQ CM ICD-10 Description Diagnosis E11.622 Type 2 diabetes mellitus with other skin ulcer I87.311 Chronic venous hypertension (idiopathic) with ulcer L97.312 Non-pressure chronic ulcer of right  ankle with fat l Martha, Eavan A. ([id number]) Modifier: of right lower ayer exposed Quantity: 1 extremity Electronic Signature(s) Signed: 11/06/2015 4:26:31 PM By: Evlyn Kanner MD, FACS Signed: 11/06/2015 4:43:04 PM By: Lucrezia Starch RN, Sendra Previous Signature: 11/06/2015 10:58:35 AM Version By:  Evlyn Kanner MD, FACS Entered By: Lucrezia Starch RN, Rosalio Macadamia on 11/06/2015 11:34:48

## 2015-11-07 NOTE — Progress Notes (Signed)
ASHLEN, KIGER ([id number]) Visit Report for 11/06/2015 Abuse/Suicide Risk Screen Details Patient Name: [name], [name] Date of Service: 11/06/2015 9:30 AM Medical Record Number: [id number] Patient Account Number: [id number] Date of Birth/Sex: 09-12-1946 (70 y.o. Female) Treating RN: Leonard Downing Primary Care Physician: Sherrie Mustache Other Clinician: Referring Physician: Sherrie Mustache Treating Physician/Extender: Rudene Re in Treatment: 0 Abuse/Suicide Risk Screen Items Answer ABUSE/SUICIDE RISK SCREEN: Has anyone close to you tried to hurt or harm you recentlyo No Do you feel uncomfortable with anyone in your familyo No Has anyone forced you do things that you didnot want to doo No Do you have any thoughts of harming yourselfo No Patient displays signs or symptoms of abuse and/or neglect. No Electronic Signature(s) Signed: 11/06/2015 4:43:04 PM By: Lucrezia Starch RN, Sendra Entered By: Lucrezia Starch RN, Sendra on 11/06/2015 09:48:11 Amason, [name] ([id number]) -------------------------------------------------------------------------------- Activities of Daily Living Details Patient Name: Marcia [name] Date of Service: 11/06/2015 9:30 AM Medical Record Number: [id number] Patient Account Number: [id number] Date of Birth/Sex: 03-03-1946 (70 y.o. Female) Treating RN: Leonard Downing Primary Care Physician: Sherrie Mustache Other Clinician: Referring Physician: Sherrie Mustache Treating Physician/Extender: Rudene Re in Treatment: 0 Activities of Daily Living Items Answer Activities of Daily Living (Please select one for each item) Drive Automobile Completely Able Take Medications Completely Able Use Telephone Completely Able Care for Appearance Completely Able Use Toilet Completely Able Bath / Shower Completely Able Dress Self Completely Able Feed Self Completely Able Walk Completely Able Get In / Out Bed Completely Able Housework Completely Able Prepare Meals  Completely Able Handle Money Completely Able Shop for Self Completely Able Electronic Signature(s) Signed: 11/06/2015 4:43:04 PM By: Lucrezia Starch RN, Sendra Entered By: Lucrezia Starch RN, Sendra on 11/06/2015 09:48:27 Carbo, [name] ([id number]) -------------------------------------------------------------------------------- Education Assessment Details Patient Name: Marcia [name] Date of Service: 11/06/2015 9:30 AM Medical Record Number: [id number] Patient Account Number: [id number] Date of Birth/Sex: 10-Oct-1945 (70 y.o. Female) Treating RN: Leonard Downing Primary Care Physician: Sherrie Mustache Other Clinician: Referring Physician: Sherrie Mustache Treating Physician/Extender: Rudene Re in Treatment: 0 Primary Learner Assessed: Patient Learning Preferences/Education Level/Primary Language Learning Preference: Explanation Highest Education Level: College or Above Preferred Language: English Cognitive Barrier Assessment/Beliefs Language Barrier: No Translator Needed: No Memory Deficit: No Emotional Barrier: No Cultural/Religious Beliefs Affecting Medical No Care: Physical Barrier Assessment Impaired Vision: Yes Glasses Knowledge/Comprehension Assessment Knowledge Level: High Comprehension Level: High Ability to understand written High instructions: Ability to understand verbal High instructions: Motivation Assessment Anxiety Level: Calm Cooperation: Cooperative Education Importance: Acknowledges Need Interest in Health Problems: Asks Questions Perception: Coherent Willingness to Engage in Self- High Management Activities: Readiness to Engage in Self- High Management Activities: Electronic Signature(s) Signed: 11/06/2015 4:43:04 PM By: Lucrezia Starch, RN, Laurence Ferrari, [name] Kitchen ([id number]) Entered By: Lucrezia Starch RN, Sendra on 11/06/2015 09:49:02 Pricer, [name] ([id number]) -------------------------------------------------------------------------------- Fall Risk  Assessment Details Patient Name: Marcia [name] Date of Service: 11/06/2015 9:30 AM Medical Record Number: [id number] Patient Account Number: [id number] Date of Birth/Sex: 1946-06-11 (70 y.o. Female) Treating RN: Leonard Downing Primary Care Physician: Sherrie Mustache Other Clinician: Referring Physician: Sherrie Mustache Treating Physician/Extender: Rudene Re in Treatment: 0 Fall Risk Assessment Items Have you had 2 or more falls in the last 12 monthso 0 Yes Have you had any fall that resulted in injury in the last 12 monthso 0 No FALL RISK ASSESSMENT: History of falling - immediate or within 3 months 0 No Secondary diagnosis 15 Yes Ambulatory aid None/bed rest/wheelchair/nurse 0 Yes Crutches/cane/walker 0 No Furniture 0  No IV Access/Saline Lock 0 No Gait/Training Normal/bed rest/immobile 0 Yes Weak 0 No Impaired 0 No Mental Status Oriented to own ability 0 Yes Electronic Signature(s) Signed: 11/06/2015 4:43:04 PM By: Lucrezia Starch, RN, Sendra Entered By: Lucrezia Starch RN, Sendra on 11/06/2015 09:49:28 Rondon, [name] ([id number]) -------------------------------------------------------------------------------- Nutrition Risk Assessment Details Patient Name: Marcia [name] Date of Service: 11/06/2015 9:30 AM Medical Record Number: [id number] Patient Account Number: [id number] Date of Birth/Sex: Aug 15, 1946 (70 y.o. Female) Treating RN: Leonard Downing Primary Care Physician: Sherrie Mustache Other Clinician: Referring Physician: Sherrie Mustache Treating Physician/Extender: Rudene Re in Treatment: 0 Height (in): 70 Weight (lbs): 188 Body Mass Index (BMI): 27 Nutrition Risk Assessment Items NUTRITION RISK SCREEN: I have an illness or condition that made me change the kind and/or 0 No amount of food I eat I eat fewer than two meals per day 0 No I eat few fruits and vegetables, or milk products 0 No I have three or more drinks of beer, liquor or wine almost every day 0  No I have tooth or mouth problems that make it hard for me to eat 0 No I don't always have enough money to buy the food I need 0 No I eat alone most of the time 0 No I take three or more different prescribed or over-the-counter drugs a 1 Yes day Without wanting to, I have lost or gained 10 pounds in the last six 0 No months I am not always physically able to shop, cook and/or feed myself 0 No Nutrition Protocols Good Risk Protocol Provide education on elevated blood sugars Moderate Risk Protocol 0 and impact on wound healing, as applicable Electronic Signature(s) Signed: 11/06/2015 4:43:04 PM By: Lucrezia Starch RN, Sendra Entered By: Lucrezia Starch RN, Sendra on 11/06/2015 09:49:43

## 2015-11-13 ENCOUNTER — Encounter: Payer: Medicare HMO | Attending: Surgery | Admitting: Surgery

## 2015-11-13 DIAGNOSIS — E559 Vitamin D deficiency, unspecified: Secondary | ICD-10-CM | POA: Insufficient documentation

## 2015-11-13 DIAGNOSIS — E11622 Type 2 diabetes mellitus with other skin ulcer: Secondary | ICD-10-CM | POA: Insufficient documentation

## 2015-11-13 DIAGNOSIS — L97312 Non-pressure chronic ulcer of right ankle with fat layer exposed: Secondary | ICD-10-CM | POA: Diagnosis not present

## 2015-11-13 DIAGNOSIS — E1140 Type 2 diabetes mellitus with diabetic neuropathy, unspecified: Secondary | ICD-10-CM | POA: Diagnosis not present

## 2015-11-13 DIAGNOSIS — E785 Hyperlipidemia, unspecified: Secondary | ICD-10-CM | POA: Diagnosis not present

## 2015-11-13 DIAGNOSIS — I10 Essential (primary) hypertension: Secondary | ICD-10-CM | POA: Diagnosis not present

## 2015-11-13 DIAGNOSIS — I87311 Chronic venous hypertension (idiopathic) with ulcer of right lower extremity: Secondary | ICD-10-CM | POA: Diagnosis not present

## 2015-11-13 DIAGNOSIS — Z87891 Personal history of nicotine dependence: Secondary | ICD-10-CM | POA: Insufficient documentation

## 2015-11-14 NOTE — Progress Notes (Signed)
ALANII, PERUSKI ([id number]) Visit Report for 11/13/2015 Chief Complaint Document Details Patient Name: [name], [name] A. Date of Service: 11/13/2015 12:45 PM Medical Record Number: [id number] Patient Account Number: [id number] Date of Birth/Sex: 07/11/1946 (70 y.o. Female) Treating RN: Leonard Downing Primary Care Physician: Sherrie Mustache Other Clinician: Referring Physician: Sherrie Mustache Treating Physician/Extender: Rudene Re in Treatment: 1 Information Obtained from: Patient Chief Complaint Patients presents for treatment of an open diabetic ulcer to the right lateral lower third of the leg which she's had for about 6 months Electronic Signature(s) Signed: 11/13/2015 1:31:39 PM By: Evlyn Kanner MD, FACS Entered By: Evlyn Kanner on 11/13/2015 13:31:38 Hulsebus, [name] ([id number]) -------------------------------------------------------------------------------- HPI Details Patient Name: Marcia [name] Date of Service: 11/13/2015 12:45 PM Medical Record Number: [id number] Patient Account Number: [id number] Date of Birth/Sex: Oct 27, 1945 (70 y.o. Female) Treating RN: Leonard Downing Primary Care Physician: Sherrie Mustache Other Clinician: Referring Physician: Sherrie Mustache Treating Physician/Extender: Rudene Re in Treatment: 1 History of Present Illness Location: right lateral lower leg Quality: Patient reports experiencing a dull pain to affected area(s). Severity: Patient states wound are getting worse. Duration: Patient has had the wound for > 6 months prior to seeking treatment at the wound center Timing: Pain in wound is Intermittent (comes and goes Context: The wound appeared gradually over time Modifying Factors: Other treatment(s) tried include: her dermatologist Dr. Adolphus Birchwood has been treating her with local care and some pentoxifylline Associated Signs and Symptoms: Patient reports having increase swelling. HPI Description: 70 year old patient with known  history of uncontrolled type 2 diabetes mellitus also has a small ulcer on the right ankle and was referred to as by her PCP. 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 She has given up smoking over 15 years ago. the patient has been under the care of her dermatologist Dr. Adolphus Birchwood who has done a biopsy for her sometime in October and said there was no cancer. We will try and get these reports from his office and the other workup and treatment he's been doing can be noted. Addendum: received the pathology report from Dr. 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 This atypia was considered reactive. Electronic Signature(s) Signed: 11/13/2015 1:31:59 PM By: Evlyn Kanner MD, FACS Entered By: Evlyn Kanner on 11/13/2015 13:31:59 Mizell, [name] ([id number]) -------------------------------------------------------------------------------- Physical Exam Details Patient Name: Marcia [name] Date of Service: 11/13/2015 12:45 PM Medical Record Number: [id number] Patient Account Number: [id number] Date of Birth/Sex: 03-Jun-1946 (70 y.o. Female) Treating RN: Leonard Downing Primary Care Physician: Sherrie Mustache Other Clinician: Referring Physician: Sherrie Mustache Treating Physician/Extender: Rudene Re in Treatment: 1 Constitutional . Pulse regular. Respirations normal and unlabored. Afebrile. . Eyes Nonicteric. Reactive to light. Ears, Nose, Mouth, and Throat Lips, teeth, and gums WNL.[name] Kitchen Moist mucosa without lesions. Neck supple and nontender. No palpable supraclavicular or cervical adenopathy. Normal sized without goiter. Respiratory WNL. No retractions.. Cardiovascular Pedal  Pulses WNL. No clubbing, cyanosis or edema. Lymphatic No adneopathy. No adenopathy. No adenopathy. Musculoskeletal Adexa without tenderness or enlargement.. Digits and nails w/o clubbing, cyanosis, infection, petechiae, ischemia, or inflammatory conditions.. Integumentary (Hair, Skin) No suspicious lesions. No crepitus or fluctuance. No peri-wound warmth or erythema. No masses.[name] Kitchen Psychiatric Judgement and insight Intact.. No evidence of depression, anxiety, or agitation.. Notes The  ulcerated area on the right lateral lower extremity is now fairly superficial and the overall situation looks much cleaner than before. Electronic Signature(s) Signed: 11/13/2015 1:32:34 PM By: Evlyn Kanner MD, FACS Entered By: Evlyn Kanner on 11/13/2015 13:32:34 Engram, [name] ([id number]) -------------------------------------------------------------------------------- Physician Orders Details Patient Name: Marcia [name] Date of Service: 11/13/2015 12:45 PM Medical Record Number: [id number] Patient Account Number: [id number] Date of Birth/Sex: 08/05/1946 (70 y.o. Female) Treating RN: Leonard Downing Primary Care Physician: Sherrie Mustache Other Clinician: Referring Physician: Sherrie Mustache Treating Physician/Extender: Rudene Re in Treatment: 1 Verbal / Phone Orders: Yes Clinician: Leonard Downing Read Back and Verified: Yes Diagnosis Coding Wound Cleansing Wound #1 Lateral Lower Leg o Cleanse wound with mild soap and water - antibacterial soap o May Shower, gently pat wound dry prior to applying new dressing. Skin Barriers/Peri-Wound Care Wound #1 Lateral Lower Leg o Barrier cream Primary Wound Dressing Wound #1 Lateral Lower Leg o Aquacel Ag Secondary Dressing Wound #1 Lateral Lower Leg o Gauze and Kerlix/Conform Dressing Change Frequency Wound #1 Lateral Lower Leg o Change dressing every other day. Follow-up Appointments Wound #1 Lateral Lower Leg o Return  Appointment in 1 week. Edema Control Wound #1 Lateral Lower Leg o Support Garment 20-30 mm/Hg pressure to: - patient to purchase Electronic Signature(s) Signed: 11/13/2015 4:17:59 PM By: Evlyn Kanner MD, FACS Signed: 11/13/2015 4:31:21 PM By: Lucrezia Starch RN, Sendra Entered By: Lucrezia Starch RN, Sendra on 11/13/2015 13:15:36 Pulice, [name] ([id number]) Leveque, [name] ([id number]) -------------------------------------------------------------------------------- Problem List Details Patient Name: Marcia [name] Date of Service: 11/13/2015 12:45 PM Medical Record Number: [id number] Patient Account Number: [id number] Date of Birth/Sex: [birth year] (70 y.o. Female) Treating RN: Leonard Downing Primary Care Physician: Sherrie Mustache Other Clinician: Referring Physician: Sherrie Mustache Treating Physician/Extender: Rudene Re in Treatment: 1 Active Problems ICD-10 Encounter Code Description Active Date Diagnosis E11.622 Type 2 diabetes mellitus with other skin ulcer 11/06/2015 Yes I87.311 Chronic venous hypertension (idiopathic) with ulcer of 11/06/2015 Yes right lower extremity L97.312 Non-pressure chronic ulcer of right ankle with fat layer 11/06/2015 Yes exposed Inactive Problems Resolved Problems Electronic Signature(s) Signed: 11/13/2015 1:31:20 PM By: Evlyn Kanner MD, FACS Entered By: Evlyn Kanner on 11/13/2015 13:31:20 Butler, [name] ([id number]) -------------------------------------------------------------------------------- Progress Note Details Patient Name: Marcia [name] Date of Service: 11/13/2015 12:45 PM Medical Record Number: [id number] Patient Account Number: [id number] Date of Birth/Sex: [birth year] (70 y.o. Female) Treating RN: Leonard Downing Primary Care Physician: Sherrie Mustache Other Clinician: Referring Physician: Sherrie Mustache Treating Physician/Extender: Rudene Re in Treatment: 1 Subjective Chief Complaint Information obtained from  Patient Patients presents for treatment of an open diabetic ulcer to the right lateral lower third of the leg which she's had for about 6 months History of Present Illness (HPI) The following HPI elements were documented for the patient's wound: Location: right lateral lower leg Quality: Patient reports experiencing a dull pain to affected area(s). Severity: Patient states wound are getting worse. Duration: Patient has had the wound for > 6 months prior to seeking treatment at the wound center Timing: Pain in wound is Intermittent (comes and goes Context: The wound appeared gradually over time Modifying Factors: Other treatment(s) tried include: her dermatologist Dr. Adolphus Birchwood has been treating her with local care and some pentoxifylline Associated Signs and Symptoms: Patient reports having increase swelling. 70 year old patient with known history of uncontrolled type 2 diabetes mellitus also has a small ulcer on the right ankle and was referred to as by her PCP. 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 She has given up smoking over 15 years ago. the patient has been under the care of her dermatologist Dr. Adolphus Birchwood who has done a biopsy for her sometime in October and said there was no cancer. We will try and get these reports from his office and the other workup and treatment he's been doing can be noted. Addendum: received the pathology report from Dr. 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 This atypia was considered reactive. Objective Constitutional [name], [name] ([id number]) Pulse regular. Respirations normal and unlabored. Afebrile. Vitals Time Taken: 1:00 PM, Height:  70 in, Weight: 188 lbs, BMI: 27, Temperature: 97.8 F, Pulse: 87 bpm, Blood Pressure: 148/62 mmHg. Eyes Nonicteric. Reactive to light. Ears, Nose, Mouth, and Throat Lips, teeth, and gums WNL.[name] Kitchen Moist mucosa without lesions. Neck supple and nontender. No palpable supraclavicular or cervical adenopathy. Normal sized without goiter. Respiratory WNL. No retractions.. Cardiovascular Pedal Pulses WNL. No clubbing, cyanosis or edema. Lymphatic No adneopathy. No adenopathy. No adenopathy. Musculoskeletal Adexa without tenderness or enlargement.. Digits and nails w/o clubbing, cyanosis, infection, petechiae, ischemia, or inflammatory conditions.[name] Kitchen Psychiatric Judgement and insight Intact.. No evidence of depression, anxiety, or agitation.. General Notes: The ulcerated area on the right lateral lower extremity is now fairly superficial and the overall situation looks much cleaner than before. Integumentary (Hair, Skin) No suspicious lesions. No crepitus or fluctuance. No peri-wound warmth or erythema. No masses.. Wound #1 status is Open. Original cause of wound was Gradually Appeared. The wound is located on the Lateral Lower Leg. The wound measures 0.3cm length x 0.3cm width x 0.1cm depth; 0.071cm^2 area and 0.007cm^3 volume. The wound is limited to skin breakdown. There is no tunneling or undermining noted. There is a small amount of serosanguineous drainage noted. The wound margin is flat and intact. There is large (67-100%) red granulation within the wound bed. There is no necrotic tissue within the wound bed. The periwound skin appearance exhibited: Localized Edema, Dry/Scaly, Moist, Ecchymosis, Erythema. The periwound skin appearance did not exhibit: Callus, Crepitus, Excoriation, Fluctuance, Friable, Induration, Rash, Scarring, Maceration, Atrophie Blanche, Cyanosis, Hemosiderin Staining, Mottled, Pallor, Rubor. The surrounding wound skin color is noted with erythema which is  circumferential. The periwound has tenderness on palpation. 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 I have also discussed her pathology report which was received from her dermatologist's office.. Local care with Aquacel Ag and a light dressing with Kerlix and Coban and await her vascular workup prior to giving her compression. She has been urged to use compression stockings which she will get over-the-counter Plan Wound Cleansing: Wound #1 Lateral Lower Leg: Cleanse wound with mild soap and water - antibacterial soap May Shower, gently pat wound dry prior to applying new dressing. Skin Barriers/Peri-Wound Care: Wound #1 Lateral Lower Leg: Barrier cream Primary Wound Dressing: Wound #1 Lateral Lower Leg: Aquacel Ag Secondary Dressing: Wound #1 Lateral Lower Leg: Gauze and Kerlix/Conform Dressing Change Frequency: Wound #1 Lateral Lower  Leg: Change dressing every other day. Follow-up Appointments: Wound #1 Lateral Lower Leg: Return Appointment in 1 week. Edema Control: Wound #1 Lateral Lower Leg: [name], [name] ([id number]) Support Garment 20-30 mm/Hg pressure to: - patient to purchase I have recommended we get the venous duplex of her right lower extremity and this has been scheduled on February 17. I have also discussed her pathology report which was received from her dermatologist's office.. Local care with Aquacel Ag and a light dressing with Kerlix and Coban and await her vascular workup prior to giving her compression. She has been urged to use compression stockings which she will get over-the-counter Electronic  Signature(s) Signed: 11/13/2015 1:34:33 PM By: Evlyn Kanner MD, FACS Entered By: Evlyn Kanner on 11/13/2015 13:34:33 [name], [name] ([id number]) -------------------------------------------------------------------------------- SuperBill Details Patient Name: Marcia [name] Date of Service: 11/13/2015 Medical Record Number: [id number] Patient Account Number: [id number] Date of Birth/Sex: 1945/11/19 (70 y.o. Female) Treating RN: Leonard Downing Primary Care Physician: Sherrie Mustache Other Clinician: Referring Physician: Sherrie Mustache Treating Physician/Extender: Rudene Re in Treatment: 1 Diagnosis Coding ICD-10 Codes Code Description E11.622 Type 2 diabetes mellitus with other skin ulcer I87.311 Chronic venous hypertension (idiopathic) with ulcer of right lower extremity L97.312 Non-pressure chronic ulcer of right ankle with fat layer exposed Facility Procedures CPT4 Code: [id number] Description: 99213 - WOUND CARE VISIT-LEV 3 EST PT Modifier: Quantity: 1 Physician Procedures CPT4 Code Description: [id number] 99213 - WC PHYS LEVEL 3 - EST PT ICD-10 Description Diagnosis E11.622 Type 2 diabetes mellitus with other skin ulcer I87.311 Chronic venous hypertension (idiopathic) with ulcer o L97.312 Non-pressure chronic ulcer of right  ankle with fat la Modifier: f right lower yer exposed Quantity: 1 extremity Electronic Signature(s) Signed: 11/13/2015 4:17:59 PM By: Evlyn Kanner MD, FACS Signed: 11/13/2015 4:31:21 PM By: Lucrezia Starch RN, Sendra Previous Signature: 11/13/2015 1:34:44 PM Version By: Evlyn Kanner MD, FACS Entered By: Lucrezia Starch RN, Sendra on 11/13/2015 14:01:46

## 2015-11-14 NOTE — Progress Notes (Signed)
Marcia, [name] ([id number]) Visit Report for 11/13/2015 Arrival Information Details Patient Name: [name], [name] Date of Service: 11/13/2015 12:45 PM Medical Record Number: [id number] Patient Account Number: [id number] Date of Birth/Sex: [birth year] (70 y.o. Female) Treating RN: Leonard Downing Primary Care Physician: Sherrie Mustache Other Clinician: Referring Physician: Sherrie Mustache Treating Physician/Extender: Rudene Re in Treatment: 1 Visit Information History Since Last Visit All ordered tests and consults were completed: No Patient Arrived: Ambulatory Added or deleted any medications: No Arrival Time: 12:59 Any new allergies or adverse reactions: No Accompanied By: self Had a fall or experienced change in No Transfer Assistance: None activities of daily living that may affect Patient Requires Transmission-Based No risk of falls: Precautions: Signs or symptoms of abuse/neglect since last No Patient Has Alerts: Yes visito Hospitalized since last visit: No Has Dressing in Place as Prescribed: Yes Pain Present Now: No Electronic Signature(s) Signed: 11/13/2015 4:31:21 PM By: Lucrezia Starch, RN, Sendra Entered By: Lucrezia Starch RN, Sendra on 11/13/2015 13:00:08 Diffee, [name] ([id number]) -------------------------------------------------------------------------------- Clinic Level of Care Assessment Details Patient Name: Marcia [name] Date of Service: 11/13/2015 12:45 PM Medical Record Number: [id number] Patient Account Number: [id number] Date of Birth/Sex: 25-Jan-1946 (70 y.o. Female) Treating RN: Leonard Downing Primary Care Physician: Sherrie Mustache Other Clinician: Referring Physician: Sherrie Mustache Treating Physician/Extender: Rudene Re in Treatment: 1 Clinic Level of Care Assessment Items TOOL 4 Quantity Score X - Use when only an EandM is performed on FOLLOW-UP visit 1 0 ASSESSMENTS - Nursing Assessment / Reassessment X - Reassessment of Co-morbidities  (includes updates in patient status) 1 10 X - Reassessment of Adherence to Treatment Plan 1 5 ASSESSMENTS - Wound and Skin Assessment / Reassessment X - Simple Wound Assessment / Reassessment - one wound 1 5 []  - Complex Wound Assessment / Reassessment - multiple wounds 0 []  - Dermatologic / Skin Assessment (not related to wound area) 0 ASSESSMENTS - Focused Assessment X - Circumferential Edema Measurements - multi extremities 1 5 []  - Nutritional Assessment / Counseling / Intervention 0 X - Lower Extremity Assessment (monofilament, tuning fork, pulses) 1 5 []  - Peripheral Arterial Disease Assessment (using hand held doppler) 0 ASSESSMENTS - Ostomy and/or Continence Assessment and Care []  - Incontinence Assessment and Management 0 []  - Ostomy Care Assessment and Management (repouching, etc.) 0 PROCESS - Coordination of Care X - Simple Patient / Family Education for ongoing care 1 15 []  - Complex (extensive) Patient / Family Education for ongoing care 0 X - Staff obtains Chiropractor, Records, Test Results / Process Orders 1 10 X - Staff telephones HHA, Nursing Homes / Clarify orders / etc 1 10 []  - Routine Transfer to another Facility (non-emergent condition) 0 Breslin, Jerrilyn A. ([id number]) []  - Routine Hospital Admission (non-emergent condition) 0 []  - New Admissions / Manufacturing engineer / Ordering NPWT, Apligraf, etc. 0 []  - Emergency Hospital Admission (emergent condition) 0 X - Simple Discharge Coordination 1 10 []  - Complex (extensive) Discharge Coordination 0 PROCESS - Special Needs []  - Pediatric / Minor Patient Management 0 []  - Isolation Patient Management 0 []  - Hearing / Language / Visual special needs 0 []  - Assessment of Community assistance (transportation, D/C planning, etc.) 0 []  - Additional assistance / Altered mentation 0 []  - Support Surface(s) Assessment (bed, cushion, seat, etc.) 0 INTERVENTIONS - Wound Cleansing / Measurement X - Simple Wound Cleansing - one  wound 1 5 []  - Complex Wound Cleansing - multiple wounds 0 X - Wound Imaging (photographs - any number of  wounds) 1 5  - Wound Tracing (instead of photographs) 0 X - Simple Wound Measurement - one wound 1 5  - Complex Wound Measurement - multiple wounds 0 INTERVENTIONS - Wound Dressings X - Small Wound Dressing one or multiple wounds 1 10  - Medium Wound Dressing one or multiple wounds 0  - Large Wound Dressing one or multiple wounds 0 X - Application of Medications - topical 1 5  - Application of Medications - injection 0 INTERVENTIONS - Miscellaneous  - External ear exam 0 Wandel, Victoria A. ([id number])  - Specimen Collection (cultures, biopsies, blood, body fluids, etc.) 0  - Specimen(s) / Culture(s) sent or taken to Lab for analysis 0  - Patient Transfer (multiple staff / Michiel Sites Lift / Similar devices) 0  - Simple Staple / Suture removal (25 or less) 0  - Complex Staple / Suture removal (26 or more) 0  - Hypo / Hyperglycemic Management (close monitor of Blood Glucose) 0  - Ankle / Brachial Index (ABI) - do not check if billed separately 0 X - Vital Signs 1 5 Has the patient been seen at the hospital within the last three years: Yes Total Score: 110 Level Of Care: New/Established - Level 3 Electronic Signature(s) Signed: 11/13/2015 4:31:21 PM By: Lucrezia Starch, RN, Sendra Entered By: Lucrezia Starch RN, Sendra on 11/13/2015 14:01:27 Mcraney, [name] ([id number]) -------------------------------------------------------------------------------- Encounter Discharge Information Details Patient Name: Marcia [name] Date of Service: 11/13/2015 12:45 PM Medical Record Number: [id number] Patient Account Number: [id number] Date of Birth/Sex: 1946/07/11 (70 y.o. Female) Treating RN: Leonard Downing Primary Care Physician: Sherrie Mustache Other Clinician: Referring Physician: Sherrie Mustache Treating Physician/Extender: Rudene Re in Treatment: 1 Encounter Discharge  Information Items Discharge Pain Level: 0 Discharge Condition: Stable Ambulatory Status: Ambulatory Discharge Destination: Home Transportation: Private Auto Accompanied By: self Schedule Follow-up Appointment: Yes Medication Reconciliation completed and provided to Patient/Care Yes Damali Broadfoot: Provided on Clinical Summary of Care: 11/13/2015 Form Type Recipient Paper Patient SP Electronic Signature(s) Signed: 11/13/2015 1:26:05 PM By: Gwenlyn Perking Entered By: Gwenlyn Perking on 11/13/2015 13:26:05 Buening, Zakira AMarland Kitchen ([id number]) -------------------------------------------------------------------------------- Lower Extremity Assessment Details Patient Name: Marcia [name] Date of Service: 11/13/2015 12:45 PM Medical Record Number: [id number] Patient Account Number: [id number] Date of Birth/Sex: May 26, 1946 (70 y.o. Female) Treating RN: Leonard Downing Primary Care Physician: Sherrie Mustache Other Clinician: Referring Physician: Sherrie Mustache Treating Physician/Extender: Rudene Re in Treatment: 1 Edema Assessment Assessed: [Left: No] [Right: No] Edema: [Left: Ye] [Right: s] Calf Left: Right: Point of Measurement: 36 cm From Medial Instep cm 41.5 cm Ankle Left: Right: Point of Measurement: 10 cm From Medial Instep cm 23 cm Vascular Assessment Pulses: Posterior Tibial Dorsalis Pedis Palpable: [Right:Yes] Extremity colors, hair growth, and conditions: Extremity Color: [Right:Mottled] Hair Growth on Extremity: [Right:No] Temperature of Extremity: [Right:Warm] Capillary Refill: [Right:< 3 seconds] Toe Nail Assessment Left: Right: Thick: No Discolored: No Deformed: No Improper Length and Hygiene: No Electronic Signature(s) Signed: 11/13/2015 4:31:21 PM By: Lucrezia Starch, RN, Sendra Entered By: Lucrezia Starch RN, Sendra on 11/13/2015 13:05:29 Kreiter, [name] ([id number]) -------------------------------------------------------------------------------- Pain Assessment  Details Patient Name: Marcia [name] Date of Service: 11/13/2015 12:45 PM Medical Record Number: [id number] Patient Account Number: [id number] Date of Birth/Sex: 12-18-1945 (70 y.o. Female) Treating RN: Leonard Downing Primary Care Physician: Sherrie Mustache Other Clinician: Referring Physician: Sherrie Mustache Treating Physician/Extender: Rudene Re in Treatment: 1 Active Problems Location of Pain Severity and Description of Pain Patient Has Paino No Site Locations Rate the pain. Current Pain Level: 0 Pain Management  and Medication Current Pain Management: Electronic Signature(s) Signed: 11/13/2015 4:31:21 PM By: Lucrezia Starch, RN, Sendra Entered By: Lucrezia Starch RN, Sendra on 11/13/2015 13:00:16 Northup, [name] ([id number]) -------------------------------------------------------------------------------- Patient/Caregiver Education Details Patient Name: Marcia [name] Date of Service: 11/13/2015 12:45 PM Medical Record Number: [id number] Patient Account Number: [id number] Date of Birth/Gender: Jun 09, 1946 (70 y.o. Female) Treating RN: Leonard Downing Primary Care Physician: Sherrie Mustache Other Clinician: Referring Physician: Sherrie Mustache Treating Physician/Extender: Rudene Re in Treatment: 1 Education Assessment Education Provided To: Patient Education Topics Provided Venous: Handouts: Controlling Swelling with Compression Stockings Methods: Demonstration, Explain/Verbal Responses: State content correctly Electronic Signature(s) Signed: 11/13/2015 4:31:21 PM By: Lucrezia Starch, RN, Sendra Entered By: Lucrezia Starch RN, Sendra on 11/13/2015 13:25:03 Callaham, [name] ([id number]) -------------------------------------------------------------------------------- Wound Assessment Details Patient Name: Marcia [name] Date of Service: 11/13/2015 12:45 PM Medical Record Number: [id number] Patient Account Number: [id number] Date of Birth/Sex: 11-15-1945 (70 y.o. 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 Etiology: Lower Extremity Date Acquired: 04/07/2015 Wound Status: Open Weeks Of Treatment: 1 Comorbid Hypertension, Type II Diabetes, Clustered Wound: No History: Neuropathy Photos Photo Uploaded By: Lucrezia Starch RN, Rosalio Macadamia on 11/13/2015 16:25:48 Wound Measurements Length: (cm) 0.3 Width: (cm) 0.3 Depth: (cm) 0.1 Area: (cm) 0.071 Volume: (cm) 0.007 % Reduction in Area: 81.9% % Reduction in Volume: 82.1% Epithelialization: None Tunneling: No Undermining: No Wound Description Classification: Partial Thickness Diabetic Severity (Wagner): Grade 1 Wound Margin: Flat and Intact Exudate Amount: Small Exudate Type: Serosanguineous Exudate Color: red, brown Foul Odor After Cleansing: No Wound Bed Granulation Amount: Large (67-100%) Exposed Structure Granulation Quality: Red Fascia Exposed: No Necrotic Amount: None Present (0%) Fat Layer Exposed: No Philemon, Jaimya A. ([id number]) Tendon Exposed: No Muscle Exposed: No Joint Exposed: No Bone Exposed: No Limited to Skin Breakdown Periwound Skin Texture Texture Color No Abnormalities Noted: No No Abnormalities Noted: No Callus: No Atrophie Blanche: No Crepitus: No Cyanosis: No Excoriation: No Ecchymosis: Yes Fluctuance: No Erythema: Yes Friable: No Erythema Location: Circumferential Induration: No Hemosiderin Staining: No Localized Edema: Yes Mottled: No Rash: No Pallor: No Scarring: No Rubor: No Moisture Temperature / Pain No Abnormalities Noted: No Tenderness on Palpation: Yes Dry / Scaly: Yes Maceration: No Moist: Yes Wound Preparation Ulcer Cleansing:  Rinsed/Irrigated with Saline Topical Anesthetic Applied: Other: lidocaine 4%, Treatment Notes Wound #1 (Lateral Lower Leg) 1. Cleansed with: Clean wound with Normal Saline 2. Anesthetic Topical Lidocaine 4% cream to wound bed prior to debridement 3. Peri-wound Care: Barrier cream 4. Dressing Applied: Aquacel Ag 5. Secondary Dressing Applied Gauze and Kerlix/Conform Notes AVV venous duplex 11/24/15 Electronic Signature(s) Signed: 11/13/2015 4:31:21 PM By: Lucrezia Starch, RN, Sendra Entered By: Lucrezia Starch RN, Sendra on 11/13/2015 13:07:35 Nagy, [name] ([id number]) Scarpino, [name] ([id number]) -------------------------------------------------------------------------------- Vitals Details Patient Name: Marcia [name] Date of Service: 11/13/2015 12:45 PM Medical Record Number: [id number] Patient Account Number: [id number] Date of Birth/Sex: 08-21-1946 (70 y.o. Female) Treating RN: Leonard Downing Primary Care Physician: Sherrie Mustache Other Clinician: Referring Physician: Sherrie Mustache Treating Physician/Extender: Rudene Re in Treatment: 1 Vital Signs Time Taken: 13:00 Temperature (F): 97.8 Height (in): 70 Pulse (bpm): 87 Weight (lbs): 188 Blood Pressure (mmHg): 148/62 Body Mass Index (BMI): 27 Reference Range: 80 - 120 mg / dl Electronic Signature(s) Signed: 11/13/2015 4:31:21 PM By: Lucrezia Starch, RN,  Rosalio Macadamia Entered By: Lucrezia Starch, RN, Rosalio Macadamia on 11/13/2015 13:01:44

## 2015-11-20 ENCOUNTER — Encounter: Payer: Medicare HMO | Admitting: Surgery

## 2015-11-20 DIAGNOSIS — E11622 Type 2 diabetes mellitus with other skin ulcer: Secondary | ICD-10-CM | POA: Diagnosis not present

## 2015-11-21 NOTE — Progress Notes (Signed)
LEYSHA, DRILL ([id number]) Visit Report for 11/20/2015 Arrival Information Details Patient Name: [name], [name] Date of Service: 11/20/2015 12:45 PM Medical Record Number: [id number] Patient Account Number: [id number] Date of Birth/Sex: 28-Oct-1945 (70 y.o. Female) Treating RN: Leonard Downing Primary Care Physician: Sherrie Mustache Other Clinician: Referring Physician: Sherrie Mustache Treating Physician/Extender: Rudene Re in Treatment: 2 Visit Information History Since Last Visit All ordered tests and consults were completed: No Patient Arrived: Ambulatory Added or deleted any medications: No Arrival Time: 12:53 Any new allergies or adverse reactions: No Accompanied By: self Had a fall or experienced change in No Transfer Assistance: None activities of daily living that may affect Patient Identification Verified: Yes risk of falls: Secondary Verification Process Yes Signs or symptoms of abuse/neglect since last No Completed: visito Patient Requires Transmission-Based No Hospitalized since last visit: No Precautions: Has Dressing in Place as Prescribed: Yes Patient Has Alerts: Yes Pain Present Now: No Electronic Signature(s) Signed: 11/20/2015 4:28:54 PM By: Lucrezia Starch RN, Sendra Entered By: Lucrezia Starch RN, Sendra on 11/20/2015 12:54:15 Joo, [name] ([id number]) -------------------------------------------------------------------------------- Clinic Level of Care Assessment Details Patient Name: [name] A. Date of Service: 11/20/2015 12:45 PM Medical Record Number: [id number] Patient Account Number: [id number] Date of Birth/Sex: [birth year] (70 y.o. Female) Treating RN: Leonard Downing Primary Care Physician: Sherrie Mustache Other Clinician: Referring Physician: Sherrie Mustache Treating Physician/Extender: Rudene Re in Treatment: 2 Clinic Level of Care Assessment Items TOOL 4 Quantity Score X - Use when only an EandM is performed on FOLLOW-UP visit 1  0 ASSESSMENTS - Nursing Assessment / Reassessment X - Reassessment of Co-morbidities (includes updates in patient status) 1 10 X - Reassessment of Adherence to Treatment Plan 1 5 ASSESSMENTS - Wound and Skin Assessment / Reassessment X - Simple Wound Assessment / Reassessment - one wound 1 5 []  - Complex Wound Assessment / Reassessment - multiple wounds 0 []  - Dermatologic / Skin Assessment (not related to wound area) 0 ASSESSMENTS - Focused Assessment X - Circumferential Edema Measurements - multi extremities 1 5 []  - Nutritional Assessment / Counseling / Intervention 0 X - Lower Extremity Assessment (monofilament, tuning fork, pulses) 1 5 []  - Peripheral Arterial Disease Assessment (using hand held doppler) 0 ASSESSMENTS - Ostomy and/or Continence Assessment and Care []  - Incontinence Assessment and Management 0 []  - Ostomy Care Assessment and Management (repouching, etc.) 0 PROCESS - Coordination of Care X - Simple Patient / Family Education for ongoing care 1 15 []  - Complex (extensive) Patient / Family Education for ongoing care 0 X - Staff obtains Chiropractor, Records, Test Results / Process Orders 1 10 []  - Staff telephones HHA, Nursing Homes / Clarify orders / etc 0 []  - Routine Transfer to another Facility (non-emergent condition) 0 [name], [name] ([id number]) []  - Routine Hospital Admission (non-emergent condition) 0 []  - New Admissions / Manufacturing engineer / Ordering NPWT, Apligraf, etc. 0 []  - Emergency Hospital Admission (emergent condition) 0 X - Simple Discharge Coordination 1 10 []  - Complex (extensive) Discharge Coordination 0 PROCESS - Special Needs []  - Pediatric / Minor Patient Management 0 []  - Isolation Patient Management 0 []  - Hearing / Language / Visual special needs 0 []  - Assessment of Community assistance (transportation, D/C planning, etc.) 0 []  - Additional assistance / Altered mentation 0 []  - Support Surface(s) Assessment (bed, cushion, seat, etc.)  0 INTERVENTIONS - Wound Cleansing / Measurement X - Simple Wound Cleansing - one wound 1 5 []  - Complex Wound Cleansing - multiple wounds 0 X -  Wound Imaging (photographs - any number of wounds) 1 5 []  - Wound Tracing (instead of photographs) 0 X - Simple Wound Measurement - one wound 1 5 []  - Complex Wound Measurement - multiple wounds 0 INTERVENTIONS - Wound Dressings X - Small Wound Dressing one or multiple wounds 1 10 []  - Medium Wound Dressing one or multiple wounds 0 []  - Large Wound Dressing one or multiple wounds 0 []  - Application of Medications - topical 0 []  - Application of Medications - injection 0 INTERVENTIONS - Miscellaneous []  - External ear exam 0 [name], [name] ([id number]) []  - Specimen Collection (cultures, biopsies, blood, body fluids, etc.) 0 []  - Specimen(s) / Culture(s) sent or taken to Lab for analysis 0 []  - Patient Transfer (multiple staff / Michiel Sites Lift / Similar devices) 0 []  - Simple Staple / Suture removal (25 or less) 0 []  - Complex Staple / Suture removal (26 or more) 0 []  - Hypo / Hyperglycemic Management (close monitor of Blood Glucose) 0 []  - Ankle / Brachial Index (ABI) - do not check if billed separately 0 X - Vital Signs 1 5 Has the patient been seen at the hospital within the last three years: Yes Total Score: 95 Level Of Care: New/Established - Level 3 Electronic Signature(s) Signed: 11/20/2015 4:28:54 PM By: Lucrezia Starch, RN, Sendra Entered By: Lucrezia Starch RN, Sendra on 11/20/2015 14:43:36 [name], [name] ([id number]) -------------------------------------------------------------------------------- Encounter Discharge Information Details Patient Name: [name] A. Date of Service: 11/20/2015 12:45 PM Medical Record Number: [id number] Patient Account Number: [id number] Date of Birth/Sex: 02/26/1946 (70 y.o. Female) Treating RN: Leonard Downing Primary Care Physician: Sherrie Mustache Other Clinician: Referring Physician: Sherrie Mustache Treating  Physician/Extender: Rudene Re in Treatment: 2 Encounter Discharge Information Items Discharge Pain Level: 0 Discharge Condition: Stable Ambulatory Status: Ambulatory Discharge Destination: Home Private Transportation: Auto Accompanied By: self Schedule Follow-up Appointment: Yes Medication Reconciliation completed and Yes provided to Patient/Care Aime Carreras: Clinical Summary of Care: Electronic Signature(s) Signed: 11/20/2015 4:28:54 PM By: Lucrezia Starch RN, Sendra Entered By: Lucrezia Starch RN, Sendra on 11/20/2015 13:25:43 Rosetti, [name] ([id number]) -------------------------------------------------------------------------------- Lower Extremity Assessment Details Patient Name: [name] A. Date of Service: 11/20/2015 12:45 PM Medical Record Number: [id number] Patient Account Number: [id number] Date of Birth/Sex: Jun 02, 1946 (70 y.o. Female) Treating RN: Leonard Downing Primary Care Physician: Sherrie Mustache Other Clinician: Referring Physician: Sherrie Mustache Treating Physician/Extender: Rudene Re in Treatment: 2 Edema Assessment Assessed: [Left: No] [Right: No] E[Left: dema] [Right: :] Calf Left: Right: Point of Measurement: 36 cm From Medial Instep cm 41 cm Ankle Left: Right: Point of Measurement: 10 cm From Medial Instep cm 22 cm Vascular Assessment Pulses: Posterior Tibial Dorsalis Pedis Palpable: [Right:Yes] Extremity colors, hair growth, and conditions: Extremity Color: [Right:Mottled] Hair Growth on Extremity: [Right:Yes] Temperature of Extremity: [Right:Warm] Capillary Refill: [Right:< 3 seconds] Dependent Rubor: [Right:No] Blanched when Elevated: [Right:No] Lipodermatosclerosis: [Right:No] Toe Nail Assessment Left: Right: Thick: No Discolored: No Deformed: No Improper Length and Hygiene: No Electronic Signature(s) Signed: 11/20/2015 4:28:54 PM By: Lucrezia Starch, RN, Laurence Ferrari, Marcia AMarland Kitchen ([id number]) Entered By: Lucrezia Starch RN, Sendra on  11/20/2015 12:59:25 Dimperio, [name] ([id number]) -------------------------------------------------------------------------------- Pain Assessment Details Patient Name: [name] A. Date of Service: 11/20/2015 12:45 PM Medical Record Number: [id number] Patient Account Number: [id number] Date of Birth/Sex: 06/02/1946 (70 y.o. Female) Treating RN: Leonard Downing Primary Care Physician: Sherrie Mustache Other Clinician: Referring Physician: Sherrie Mustache Treating Physician/Extender: Rudene Re in Treatment: 2 Active Problems Location of Pain Severity and Description of Pain Patient Has Paino No  Site Locations Rate the pain. Current Pain Level: 1 Pain Management and Medication Current Pain Management: Electronic Signature(s) Signed: 11/20/2015 4:28:54 PM By: Lucrezia Starch RN, Sendra Entered By: Lucrezia Starch RN, Sendra on 11/20/2015 12:54:24 Minervini, [name] ([id number]) -------------------------------------------------------------------------------- Patient/Caregiver Education Details Patient Name: [name] A. Date of Service: 11/20/2015 12:45 PM Medical Record Number: [id number] Patient Account Number: [id number] Date of Birth/Gender: 11-Feb-1946 (70 y.o. Female) Treating RN: Leonard Downing Primary Care Physician: Sherrie Mustache Other Clinician: Referring Physician: Sherrie Mustache Treating Physician/Extender: Rudene Re in Treatment: 2 Education Assessment Education Provided To: Patient Education Topics Provided Wound/Skin Impairment: Handouts: Caring for Your Ulcer, Skin Care Do's and Dont's Methods: Explain/Verbal Responses: State content correctly Electronic Signature(s) Signed: 11/20/2015 4:28:54 PM By: Lucrezia Starch RN, Sendra Entered By: Lucrezia Starch RN, Sendra on 11/20/2015 13:25:52 Kneeland, [name] ([id number]) -------------------------------------------------------------------------------- Wound Assessment Details Patient Name: [name] A. Date of Service:  11/20/2015 12:45 PM Medical Record Number: [id number] Patient Account Number: [id number] Date of Birth/Sex: [birth year] (70 y.o. 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 Etiology: Lower Extremity Date Acquired: 04/07/2015 Wound Status: Open Weeks Of Treatment: 2 Comorbid Hypertension, Type II Diabetes, Clustered Wound: No History: Neuropathy Photos Photo Uploaded By: Alejandro Mulling on 11/20/2015 16:27:39 Wound Measurements Length: (cm) 0.3 Width: (cm) 0.3 Depth: (cm) 0.1 Area: (cm) 0.071 Volume: (cm) 0.007 % Reduction in Area: 81.9% % Reduction in Volume: 82.1% Epithelialization: None Tunneling: No Undermining: No Wound Description Classification: Partial Thickness Diabetic Severity (Wagner): Grade 1 Wound Margin: Flat and Intact Exudate Amount: Small Exudate Type: Serosanguineous Exudate Color: red, brown Foul Odor After Cleansing: No Wound Bed Granulation Amount: Large (67-100%) Exposed Structure Granulation Quality: Red Fascia Exposed: No Necrotic Amount: None Present (0%) Fat Layer Exposed: No [name], [name] ([id number]) Tendon Exposed: No Muscle Exposed: No Joint Exposed: No Bone Exposed: No Limited to Skin Breakdown Periwound Skin Texture Texture Color No Abnormalities Noted: No No Abnormalities Noted: No Callus: No Atrophie Blanche: No Crepitus: No Cyanosis: No Excoriation: No Ecchymosis: Yes Fluctuance: No Erythema: No Friable: No Hemosiderin Staining: No Induration: No Mottled: No Localized Edema: No Pallor: No Rash: No Rubor: No Scarring: No Temperature / Pain Moisture Tenderness on Palpation: Yes No  Abnormalities Noted: No Dry / Scaly: Yes Maceration: No Moist: No Wound Preparation Ulcer Cleansing: Rinsed/Irrigated with Saline Topical Anesthetic Applied: Other: lidocaine 4%, Treatment Notes Wound #1 (Lateral Lower Leg) 1. Cleansed with: Clean wound with Normal Saline Cleanse wound with antibacterial soap and water 2. Anesthetic Topical Lidocaine 4% cream to wound bed prior to debridement 3. Peri-wound Care: Antifungal powder Barrier cream 4. Dressing Applied: Aquacel Ag 5. Secondary Dressing Applied Gauze and Kerlix/Conform 7. Secured with Support Garment 20-30 mm/Hg pressure to: Notes AVV venous duplex 11/24/15 [name], [name] ([id number]) Electronic Signature(s) Signed: 11/20/2015 4:28:54 PM By: Lucrezia Starch RN, Sendra Entered By: Lucrezia Starch RN, Sendra on 11/20/2015 12:58:12 [name], [name] ([id number]) -------------------------------------------------------------------------------- Vitals Details Patient Name: [name] A. Date of Service: 11/20/2015 12:45 PM Medical Record Number: [id number] Patient Account Number: [id number] Date of Birth/Sex: 1946-03-30 (70 y.o. Female) Treating RN: Leonard Downing Primary Care Physician: Sherrie Mustache Other Clinician: Referring Physician: Sherrie Mustache Treating Physician/Extender: Rudene Re in Treatment: 2 Vital Signs Time Taken: 13:00 Temperature (F): 98.3 Height (in): 70 Pulse (bpm): 79  Weight (lbs): 188 Blood Pressure (mmHg): 150/62 Body Mass Index (BMI): 27 Reference Range: 80 - 120 mg / dl Electronic Signature(s) Signed: 11/20/2015 4:28:54 PM By: Lucrezia Starch RN, Sendra Entered By: Lucrezia Starch RN, Sendra on 11/20/2015 13:03:36

## 2015-11-21 NOTE — Progress Notes (Signed)
[name], [name] ([id number]) Visit Report for 11/20/2015 Chief Complaint Document Details Patient Name: [name], [name] Date of Service: 11/20/2015 12:45 PM Medical Record Number: [id number] Patient Account Number: [id number] Date of Birth/Sex: 30-May-1946 (70 y.o. Female) Treating RN: Leonard Downing Primary Care Physician: Sherrie Mustache Other Clinician: Referring Physician: Sherrie Mustache Treating Physician/Extender: Rudene Re in Treatment: 2 Information Obtained from: Patient Chief Complaint Patients presents for treatment of an open diabetic ulcer to the right lateral lower third of the leg which she's had for about 6 months Electronic Signature(s) Signed: 11/20/2015 1:29:52 PM By: Evlyn Kanner MD, FACS Entered By: Evlyn Kanner on 11/20/2015 13:29:52 [place], [name] ([id number]) -------------------------------------------------------------------------------- HPI Details Patient Name: Marcia [name] Date of Service: 11/20/2015 12:45 PM Medical Record Number: [id number] Patient Account Number: [id number] Date of Birth/Sex: 12/11/1945 (70 y.o. Female) Treating RN: Leonard Downing Primary Care Physician: Sherrie Mustache Other Clinician: Referring Physician: Sherrie Mustache Treating Physician/Extender: Rudene Re in Treatment: 2 History of Present Illness Location: right lateral lower leg Quality: Patient reports experiencing a dull pain to affected area(s). Severity: Patient states wound are getting worse. Duration: Patient has had the wound for > 6 months prior to seeking treatment at the wound center Timing: Pain in wound is Intermittent (comes and goes Context: The wound appeared gradually over time Modifying Factors: Other treatment(s) tried include: her dermatologist Dr. Adolphus Birchwood has been treating her with local care and some pentoxifylline Associated Signs and Symptoms: Patient reports having increase swelling. HPI Description: 70 year old patient with known  history of uncontrolled type 2 diabetes mellitus also has a small ulcer on the right ankle and was referred to as by her PCP. 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 She has given up smoking over 15 years ago. the patient has been under the care of her dermatologist Dr. Adolphus Birchwood who has done a biopsy for her sometime in October and said there was no cancer. We will try and get these reports from his office and the other workup and treatment he's been doing can be noted. Addendum: received the pathology report from Dr. 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 This atypia was considered reactive. Electronic Signature(s) Signed: 11/20/2015 1:29:58 PM By: Evlyn Kanner MD, FACS Entered By: Evlyn Kanner on 11/20/2015 13:29:58 [place], [name] ([id number]) -------------------------------------------------------------------------------- Physical Exam Details Patient Name: Marcia [name] Date of Service: 11/20/2015 12:45 PM Medical Record Number: [id number] Patient Account Number: [id number] Date of Birth/Sex: 1946/09/12 (70 y.o. Female) Treating RN: Leonard Downing Primary Care Physician: Sherrie Mustache Other Clinician: Referring Physician: Sherrie Mustache Treating Physician/Extender: Rudene Re in Treatment: 2 Constitutional . Pulse regular. Respirations normal and unlabored. Afebrile. . Eyes Nonicteric. Reactive to light. Ears, Nose, Mouth, and Throat Lips, teeth, and gums WNL.[name] Kitchen Moist mucosa without lesions. Neck supple and nontender. No palpable supraclavicular or cervical adenopathy. Normal sized without goiter. Respiratory WNL. No  retractions.. Cardiovascular Pedal Pulses WNL. No clubbing, cyanosis or edema. Lymphatic No adneopathy. No adenopathy. No adenopathy. Musculoskeletal Adexa without tenderness or enlargement.. Digits and nails w/o clubbing, cyanosis, infection, petechiae, ischemia, or inflammatory conditions.. Integumentary (Hair, Skin) No suspicious lesions. No crepitus or fluctuance. No peri-wound warmth or erythema. No masses.[name] Kitchen Psychiatric Judgement and insight Intact.. No evidence of depression, anxiety, or agitation.. Notes the  ulcerated area is now smaller and more superficial but surrounding that she has quite a bit of redness and the look of possibly fungal infection. Electronic Signature(s) Signed: 11/20/2015 1:30:30 PM By: Evlyn Kanner MD, FACS Entered By: Evlyn Kanner on 11/20/2015 13:30:30 [place], [name] ([id number]) -------------------------------------------------------------------------------- Physician Orders Details Patient Name: Marcia [name] Date of Service: 11/20/2015 12:45 PM Medical Record Number: [id number] Patient Account Number: [id number] Date of Birth/Sex: 1946/06/22 (70 y.o. Female) Treating RN: Leonard Downing Primary Care Physician: Sherrie Mustache Other Clinician: Referring Physician: Sherrie Mustache Treating Physician/Extender: Rudene Re in Treatment: 2 Verbal / Phone Orders: Yes Clinician: Leonard Downing Read Back and Verified: Yes Diagnosis Coding Wound Cleansing Wound #1 Lateral Lower Leg o Cleanse wound with mild soap and water - antibacterial soap o May Shower, gently pat wound dry prior to applying new dressing. Skin Barriers/Peri-Wound Care Wound #1 Lateral Lower Leg o Antifungal cream - lotrisone cream to area daily o Barrier cream - to periwound Primary Wound Dressing Wound #1 Lateral Lower Leg o Aquacel Ag Secondary Dressing Wound #1 Lateral Lower Leg o Gauze and Kerlix/Conform Dressing Change Frequency Wound #1 Lateral  Lower Leg o Change dressing every other day. Follow-up Appointments Wound #1 Lateral Lower Leg o Return Appointment in 1 week. Edema Control Wound #1 Lateral Lower Leg o Support Garment 20-30 mm/Hg pressure to: - patient to wear own compression Patient Medications Allergies: no known allergies Notifications Medication Indication Start End Lotrisone 11/20/2015 [name], [name] ([id number]) Notifications Medication Indication Start End DOSE topical 1 %-0.05 % cream - cream topical as directed daily Electronic Signature(s) Signed: 11/20/2015 1:29:30 PM By: Evlyn Kanner MD, FACS Entered By: Evlyn Kanner on 11/20/2015 13:29:30 [place], [name] ([id number]) -------------------------------------------------------------------------------- Prescription 11/20/2015 Patient Name: Marcia [name] Physician: Evlyn Kanner MD Date of Birth: 09-29-1946 NPI#: [id number] Sex: F DEA#: [id number] Phone #: [phone] License #: Patient Address: [facility] [address] [place], [place] [address] [facility] [address] [place], [place] [address] [phone] Allergies no known allergies Physician's Orders Antifungal cream - lotrisone cream to area daily Signature(s): Date(s): Electronic Signature(s) Signed: 11/20/2015 4:26:55 PM By: Evlyn Kanner MD, FACS Entered By: Evlyn Kanner on 11/20/2015 13:29:39 [place], [name] ([id number]) --------------------------------------------------------------------------------  Problem List Details Patient Name: Marcia [name] Date of Service: 11/20/2015 12:45 PM Medical Record Number: [id number] Patient Account Number: [id number] Date of Birth/Sex: October 18, 1945 (70 y.o. Female) Treating RN: Leonard Downing Primary Care Physician: Sherrie Mustache Other Clinician: Referring Physician: Sherrie Mustache Treating Physician/Extender: Rudene Re in Treatment: 2 Active  Problems ICD-10 Encounter Code Description Active Date Diagnosis E11.622 Type 2 diabetes mellitus with other skin ulcer 11/06/2015 Yes I87.311 Chronic venous hypertension (idiopathic) with ulcer of 11/06/2015 Yes right lower extremity L97.312 Non-pressure chronic ulcer of right ankle with fat layer 11/06/2015 Yes exposed Inactive Problems Resolved Problems Electronic Signature(s) Signed: 11/20/2015 1:28:17 PM By: Evlyn Kanner MD, FACS Entered By: Evlyn Kanner on 11/20/2015 13:28:17 [place], [name] ([id number]) -------------------------------------------------------------------------------- Progress Note Details Patient Name: Marcia [name] Date of Service: 11/20/2015 12:45 PM Medical Record Number: [id number] Patient Account Number: [id number] Date of Birth/Sex: July 28, 1946 (71 y.o. Female) Treating RN: Leonard Downing Primary Care Physician: Sherrie Mustache Other Clinician: Referring Physician: Sherrie Mustache Treating Physician/Extender: Rudene Re in Treatment: 2 Subjective Chief Complaint Information obtained from Patient Patients presents for treatment of an open diabetic ulcer to the right lateral lower third of the leg which she's had for about 6 months History  of Present Illness (HPI) The following HPI elements were documented for the patient's wound: Location: right lateral lower leg Quality: Patient reports experiencing a dull pain to affected area(s). Severity: Patient states wound are getting worse. Duration: Patient has had the wound for > 6 months prior to seeking treatment at the wound center Timing: Pain in wound is Intermittent (comes and goes Context: The wound appeared gradually over time Modifying Factors: Other treatment(s) tried include: her dermatologist Dr. Adolphus Birchwood has been treating her with local care and some pentoxifylline Associated Signs and Symptoms: Patient reports having increase swelling. 70 year old patient with known history of  uncontrolled type 2 diabetes mellitus also has a small ulcer on the right ankle and was referred to as by her PCP. 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 She has given up smoking over 15 years ago. the patient has been under the care of her dermatologist Dr. Adolphus Birchwood who has done a biopsy for her sometime in October and said there was no cancer. We will try and get these reports from his office and the other workup and treatment he's been doing can be noted. Addendum: received the pathology report from Dr. 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 This atypia was considered reactive. Objective Constitutional [name], [name] ([id number]) Pulse regular. Respirations normal and unlabored. Afebrile. Vitals Time Taken: 1:00 PM, Height: 70 in, Weight: 188 lbs, BMI: 27, Temperature: 98.3 F, Pulse: 79 bpm, Blood Pressure: 150/62 mmHg. Eyes Nonicteric. Reactive to light. Ears, Nose, Mouth, and Throat Lips, teeth, and gums WNL.[name] Kitchen Moist mucosa without lesions. Neck supple and nontender. No palpable supraclavicular or cervical adenopathy. Normal sized without goiter. Respiratory WNL. No retractions.. Cardiovascular Pedal Pulses WNL. No clubbing, cyanosis or edema. Lymphatic No adneopathy. No adenopathy. No adenopathy. Musculoskeletal Adexa without tenderness or enlargement.. Digits and nails w/o clubbing, cyanosis, infection, petechiae, ischemia, or inflammatory conditions.[name] Kitchen Psychiatric Judgement and insight Intact.. No evidence of depression, anxiety, or agitation.. General Notes: the ulcerated area is now smaller and more superficial but surrounding that she has quite a bit of  redness and the look of possibly fungal infection. Integumentary (Hair, Skin) No suspicious lesions. No crepitus or fluctuance. No peri-wound warmth or erythema. No masses.. Wound #1 status is Open. Original cause of wound was Gradually Appeared. The wound is located on the Lateral Lower Leg. The wound measures 0.3cm length x 0.3cm width x 0.1cm depth; 0.071cm^2 area and 0.007cm^3 volume. The wound is limited to skin breakdown. There is no tunneling or undermining noted. There is a small amount of serosanguineous drainage noted. The wound margin is flat and intact. There is large (67-100%) red granulation within the wound bed. There is no necrotic tissue within the wound bed. The periwound skin appearance exhibited: Dry/Scaly, Ecchymosis. The periwound skin appearance did not exhibit: Callus, Crepitus, Excoriation, Fluctuance, Friable, Induration, Localized Edema, Rash, Scarring, Maceration, Moist, Atrophie Blanche, Cyanosis, Hemosiderin Staining, Mottled, Pallor, Rubor, Erythema. The periwound has tenderness on palpation. 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 Skin Barriers/Peri-Wound Care: Wound #1 Lateral Lower Leg: Antifungal cream - lotrisone cream to area daily Barrier cream - to periwound Primary Wound Dressing: Wound #1 Lateral Lower Leg: Aquacel Ag Secondary Dressing: Wound #1 Lateral Lower Leg: Gauze and Kerlix/Conform Dressing Change Frequency: Wound #1 Lateral Lower Leg: Change dressing every other day. Follow-up Appointments: Wound #1 Lateral Lower  Leg: Return Appointment in 1 week. Edema Control: Wound #1 Lateral Lower Leg: Support Garment 20-30 mm/Hg pressure to: - patient to wear own compression The following medication(s) was prescribed: Lotrisone topical 1 %-0.05 % cream cream topical as directed daily starting 11/20/2015 [name], [name] ([id number]) Local care with Aquacel Ag and a light dressing with Kerlix and Coban and await her vascular workup prior to giving her compression. I have also recommended some Lotrisone ointment to be applied to the surrounding skin which may be a fungal infection. She has been urged to use compression stockings which she will get over-the-counter Electronic Signature(s) Signed: 11/20/2015 1:31:16 PM By: Evlyn Kanner MD, FACS Entered By: Evlyn Kanner on 11/20/2015 13:31:16 [place], [name] ([id number]) -------------------------------------------------------------------------------- SuperBill Details Patient Name: Marcia [name] Date of Service: 11/20/2015 Medical Record Number: [id number] Patient Account Number: [id number] Date of Birth/Sex: 12/01/1945 (70 y.o. Female) Treating RN: Leonard Downing Primary Care Physician: Sherrie Mustache Other Clinician: Referring Physician: Sherrie Mustache Treating Physician/Extender: Rudene Re in Treatment: 2 Diagnosis Coding ICD-10 Codes Code Description E11.622 Type 2 diabetes mellitus with other skin ulcer I87.311 Chronic venous hypertension (idiopathic) with ulcer of right lower extremity L97.312 Non-pressure chronic ulcer of right ankle with fat layer exposed Facility Procedures CPT4 Code: [id number] Description: 99213 - WOUND CARE VISIT-LEV 3 EST PT Modifier: Quantity: 1 Physician Procedures CPT4 Code Description: [id number] 99213 - WC PHYS LEVEL 3 - EST PT ICD-10 Description Diagnosis E11.622 Type 2 diabetes mellitus with other skin ulcer I87.311 Chronic venous hypertension (idiopathic) with ulcer o L97.312 Non-pressure chronic ulcer of right   ankle with fat la Modifier: f right lower yer exposed Quantity: 1 extremity Electronic Signature(s) Signed: 11/20/2015 4:26:55 PM By: Evlyn Kanner MD, FACS Signed: 11/20/2015 4:28:54 PM By: Lucrezia Starch RN, [place] Previous Signature: 11/20/2015 1:31:29 PM Version By: Evlyn Kanner MD, FACS Entered By: Lucrezia Starch RN, [place] on 11/20/2015 14:43:46

## 2015-11-27 ENCOUNTER — Encounter: Payer: Medicare HMO | Admitting: Surgery

## 2015-11-27 DIAGNOSIS — E11622 Type 2 diabetes mellitus with other skin ulcer: Secondary | ICD-10-CM | POA: Diagnosis not present

## 2015-11-28 NOTE — Progress Notes (Signed)
VERNEE, BAINES ([id number]) Visit Report for 11/27/2015 Arrival Information Details Patient Name: DIVYA, MUNSHI A. Date of Service: 11/27/2015 10:15 AM Medical Record Number: [id number] Patient Account Number: [id number] Date of Birth/Sex: 1946-05-29 (70 y.o. Female) Treating RN: Curtis Sites Primary Care Physician: Sherrie Mustache Other Clinician: Referring Physician: Sherrie Mustache Treating Physician/Extender: Rudene Re in Treatment: 3 Visit Information History Since Last Visit Added or deleted any medications: No Patient Arrived: Ambulatory Any new allergies or adverse reactions: No Arrival Time: 10:29 Had a fall or experienced change in No Accompanied By: self activities of daily living that may affect Transfer Assistance: None risk of falls: Patient Identification Verified: Yes Signs or symptoms of abuse/neglect since last No Secondary Verification Process Yes visito Completed: Hospitalized since last visit: No Patient Requires Transmission-Based No Pain Present Now: No Precautions: Patient Has Alerts: Yes Electronic Signature(s) Signed: 11/27/2015 5:40:21 PM By: Curtis Sites Entered By: Curtis Sites on 11/27/2015 10:30:09 Serda, [name] ([id number]) -------------------------------------------------------------------------------- Encounter Discharge Information Details Patient Name: [name] Date of Service: 11/27/2015 10:15 AM Medical Record Number: [id number] Patient Account Number: [id number] Date of Birth/Sex: 31-Dec-1945 (70 y.o. Female) Treating RN: Curtis Sites Primary Care Physician: Sherrie Mustache Other Clinician: Referring Physician: Sherrie Mustache Treating Physician/Extender: Rudene Re in Treatment: 3 Encounter Discharge Information Items Schedule Follow-up Appointment: No Medication Reconciliation completed No and provided to Patient/Care Zimri Brennen: Provided on Clinical Summary of Care: 11/27/2015 Form Type Recipient Paper  Patient SP Electronic Signature(s) Signed: 11/27/2015 10:47:50 AM By: Gwenlyn Perking Entered By: Gwenlyn Perking on 11/27/2015 10:47:50 Veley, [name] ([id number]) -------------------------------------------------------------------------------- Lower Extremity Assessment Details Patient Name: [name] Date of Service: 11/27/2015 10:15 AM Medical Record Number: [id number] Patient Account Number: [id number] Date of Birth/Sex: [birth year] (70 y.o. Female) Treating RN: Curtis Sites Primary Care Physician: Sherrie Mustache Other Clinician: Referring Physician: Sherrie Mustache Treating Physician/Extender: Rudene Re in Treatment: 3 Edema Assessment Assessed: [Left: No] [Right: No] Edema: [Left: Ye] [Right: s] Vascular Assessment Pulses: Posterior Tibial Dorsalis Pedis Palpable: [Right:Yes] Extremity colors, hair growth, and conditions: Extremity Color: [Right:Mottled] Hair Growth on Extremity: [Right:No] Temperature of Extremity: [Right:Warm] Capillary Refill: [Right:< 3 seconds] Electronic Signature(s) Signed: 11/27/2015 5:40:21 PM By: Curtis Sites Entered By: Curtis Sites on 11/27/2015 10:40:22 Saetern, Leylanie AMarland Kitchen ([id number]) -------------------------------------------------------------------------------- Multi Wound Chart Details Patient Name: [name] Date of Service: 11/27/2015 10:15 AM Medical Record Number: [id number] Patient Account Number: [id number] Date of Birth/Sex: [birth year] (70 y.o. Female) Treating RN: Curtis Sites Primary Care Physician: Sherrie Mustache Other Clinician: Referring Physician: Sherrie Mustache Treating Physician/Extender: Rudene Re in Treatment: 3 Vital Signs Height(in): 70 Pulse(bpm): 79 Weight(lbs): 188 Blood Pressure 127/60 (mmHg): Body Mass Index(BMI): 27 Temperature(F): 98.1 Respiratory Rate 18 (breaths/min): Photos: [1:No Photos] [N/A:N/A] Wound Location: [1:Lower Leg - Lateral] [N/A:N/A] Wounding Event:  [1:Gradually Appeared] [N/A:N/A] Primary Etiology: [1:Venous Leg Ulcer] [N/A:N/A] Secondary Etiology: [1:Diabetic Wound/Ulcer of the Lower Extremity] [N/A:N/A] Comorbid History: [1:Hypertension, Type II Diabetes, Neuropathy] [N/A:N/A] Date Acquired: [1:04/07/2015] [N/A:N/A] Weeks of Treatment: [1:3] [N/A:N/A] Wound Status: [1:Open] [N/A:N/A] Measurements L x W x D 0.1x0.1x0.1 [N/A:N/A] (cm) Area (cm) : [1:0.008] [N/A:N/A] Volume (cm) : [1:0.001] [N/A:N/A] % Reduction in Area: [1:98.00%] [N/A:N/A] % Reduction in Volume: 97.40% [N/A:N/A] Classification: [1:Partial Thickness] [N/A:N/A] HBO Classification: [1:Grade 1] [N/A:N/A] Exudate Amount: [1:Small] [N/A:N/A] Exudate Type: [1:Serosanguineous] [N/A:N/A] Exudate Color: [1:red, brown] [N/A:N/A] Wound Margin: [1:Flat and Intact] [N/A:N/A] Granulation Amount: [1:Medium (34-66%)] [N/A:N/A] Granulation Quality: [1:Red] [N/A:N/A] Necrotic Amount: [1:Medium (34-66%)] [N/A:N/A] Necrotic Tissue: [1:Eschar] [N/A:N/A] Exposed Structures: [1:Fascia: No Fat: No] [  N/A:N/A] Tendon: No Muscle: No Joint: No Bone: No Limited to Skin Breakdown Epithelialization: None N/A N/A Periwound Skin Texture: Edema: No N/A N/A Excoriation: No Induration: No Callus: No Crepitus: No Fluctuance: No Friable: No Rash: No Scarring: No Periwound Skin Dry/Scaly: Yes N/A N/A Moisture: Maceration: No Moist: No Periwound Skin Color: Ecchymosis: Yes N/A N/A Atrophie Blanche: No Cyanosis: No Erythema: No Hemosiderin Staining: No Mottled: No Pallor: No Rubor: No Tenderness on Yes N/A N/A Palpation: Wound Preparation: Ulcer Cleansing: N/A N/A Rinsed/Irrigated with Saline Topical Anesthetic Applied: Other: lidocaine 4% Treatment Notes Electronic Signature(s) Signed: 11/27/2015 5:40:21 PM By: Curtis Sites Entered By: Curtis Sites on 11/27/2015 10:40:47 Cumber, [name]  ([id number]) -------------------------------------------------------------------------------- Multi-Disciplinary Care Plan Details Patient Name: [name] Date of Service: 11/27/2015 10:15 AM Medical Record Number: [id number] Patient Account Number: [id number] Date of Birth/Sex: 12-16-1945 (70 y.o. Female) Treating RN: Curtis Sites Primary Care Physician: Sherrie Mustache Other Clinician: Referring Physician: Sherrie Mustache Treating Physician/Extender: Rudene Re in Treatment: 3 Active Inactive Orientation to the Wound Care Program Nursing Diagnoses: Knowledge deficit related to the wound healing center program Goals: Patient/caregiver will verbalize understanding of the Wound Healing Center Program Date Initiated: 11/06/2015 Goal Status: Active Interventions: Provide education on orientation to the wound center Notes: Venous Leg Ulcer Nursing Diagnoses: Potential for venous Insuffiency (use before diagnosis confirmed) Goals: Non-invasive venous studies are completed as ordered Date Initiated: 11/06/2015 Goal Status: Active Patient will maintain optimal edema control Date Initiated: 11/06/2015 Goal Status: Active Patient/caregiver will verbalize understanding of disease process and disease management Date Initiated: 11/06/2015 Goal Status: Active Verify adequate tissue perfusion prior to therapeutic compression application Date Initiated: 11/06/2015 Goal Status: Active Interventions: Assess peripheral edema status every visit. Compression as ordered Sieler, Kerrigan A. ([id number]) Provide education on venous insufficiency Treatment Activities: Non-invasive vascular studies : 11/27/2015 Test ordered outside of clinic : 11/27/2015 Therapeutic compression applied : 11/27/2015 Notes: Wound/Skin Impairment Nursing Diagnoses: Impaired tissue integrity Goals: Patient/caregiver will verbalize understanding of skin care regimen Date Initiated: 11/06/2015 Goal Status:  Active Ulcer/skin breakdown will have a volume reduction of 30% by week 4 Date Initiated: 11/06/2015 Goal Status: Active Ulcer/skin breakdown will have a volume reduction of 50% by week 8 Date Initiated: 11/06/2015 Goal Status: Active Ulcer/skin breakdown will have a volume reduction of 80% by week 12 Date Initiated: 11/06/2015 Goal Status: Active Ulcer/skin breakdown will heal within 14 weeks Date Initiated: 11/06/2015 Goal Status: Active Interventions: Assess patient/caregiver ability to perform ulcer/skin care regimen upon admission and as needed Assess ulceration(s) every visit Provide education on ulcer and skin care Notes: Electronic Signature(s) Signed: 11/27/2015 5:40:21 PM By: Curtis Sites Entered By: Curtis Sites on 11/27/2015 10:40:37 Mack, [name] ([id number]) -------------------------------------------------------------------------------- Wound Assessment Details Patient Name: [name] Date of Service: 11/27/2015 10:15 AM Medical Record Number: [id number] Patient Account Number: [id number] Date of Birth/Sex: 1946-02-23 (70 y.o. Female) Treating RN: Curtis Sites Primary Care Physician: Sherrie Mustache Other Clinician: Referring Physician: Sherrie Mustache Treating Physician/Extender: Rudene Re in Treatment: 3 Wound Status Wound Number: 1 Primary Etiology: Venous Leg Ulcer Wound Location: Lower Leg - Lateral Secondary Diabetic Wound/Ulcer of the Lower Etiology: Extremity Wounding Event: Gradually Appeared Wound Status: Open Date Acquired: 04/07/2015 Comorbid Hypertension, Type II Diabetes, Weeks Of Treatment: 3 History: Neuropathy Clustered Wound: No Photos Photo Uploaded By: Curtis Sites on 11/27/2015 12:44:30 Wound Measurements Length: (cm) 0.1 Width: (cm) 0.1 Depth: (cm) 0.1 Area: (cm) 0.008 Volume: (cm) 0.001 % Reduction in Area: 98% % Reduction in Volume: 97.4%  Epithelialization: None Tunneling: No Undermining: No Wound  Description Classification: Partial Thickness Foul Odor Af Diabetic Severity (Wagner): Grade 1 Wound Margin: Flat and Intact Exudate Amount: Small Exudate Type: Serosanguineous Exudate Color: red, brown ter Cleansing: No Wound Bed Granulation Amount: Medium (34-66%) Exposed Structure Granulation Quality: Red Fascia Exposed: No Necrotic Amount: Medium (34-66%) Fat Layer Exposed: No Reels, Aisley A. ([id number]) Necrotic Quality: Eschar Tendon Exposed: No Muscle Exposed: No Joint Exposed: No Bone Exposed: No Limited to Skin Breakdown Periwound Skin Texture Texture Color No Abnormalities Noted: No No Abnormalities Noted: No Callus: No Atrophie Blanche: No Crepitus: No Cyanosis: No Excoriation: No Ecchymosis: Yes Fluctuance: No Erythema: No Friable: No Hemosiderin Staining: No Induration: No Mottled: No Localized Edema: No Pallor: No Rash: No Rubor: No Scarring: No Temperature / Pain Moisture Tenderness on Palpation: Yes No Abnormalities Noted: No Dry / Scaly: Yes Maceration: No Moist: No Wound Preparation Ulcer Cleansing: Rinsed/Irrigated with Saline Topical Anesthetic Applied: Other: lidocaine 4%, Electronic Signature(s) Signed: 11/27/2015 5:40:21 PM By: Curtis Sites Entered By: Curtis Sites on 11/27/2015 10:39:26 Seamans, [name] ([id number]) -------------------------------------------------------------------------------- Vitals Details Patient Name: [name] Date of Service: 11/27/2015 10:15 AM Medical Record Number: [id number] Patient Account Number: [id number] Date of Birth/Sex: September 30, 1946 (70 y.o. Female) Treating RN: Curtis Sites Primary Care Physician: Sherrie Mustache Other Clinician: Referring Physician: Sherrie Mustache Treating Physician/Extender: Rudene Re in Treatment: 3 Vital Signs Time Taken: 10:30 Temperature (F): 98.1 Height (in): 70 Pulse (bpm): 79 Weight (lbs): 188 Respiratory Rate (breaths/min): 18 Body Mass Index  (BMI): 27 Blood Pressure (mmHg): 127/60 Reference Range: 80 - 120 mg / dl Electronic Signature(s) Signed: 11/27/2015 5:40:21 PM By: Curtis Sites Entered By: Curtis Sites on 11/27/2015 10:30:58

## 2015-11-28 NOTE — Progress Notes (Signed)
TAMICA, COVELL ([id number]) Visit Report for 11/27/2015 Chief Complaint Document Details Patient Name: [name], [name] Date of Service: 11/27/2015 10:15 AM Medical Record Number: [id number] Patient Account Number: [id number] Date of Birth/Sex: 04/20/1946 (70 y.o. Female) Treating RN: Curtis Sites Primary Care Physician: Sherrie Mustache Other Clinician: Referring Physician: Sherrie Mustache Treating Physician/Extender: Rudene Re in Treatment: 3 Information Obtained from: Patient Chief Complaint Patients presents for treatment of an open diabetic ulcer to the right lateral lower third of the leg which she's had for about 6 months Electronic Signature(s) Signed: 11/27/2015 10:45:48 AM By: Evlyn Kanner MD, FACS Entered By: Evlyn Kanner on 11/27/2015 10:45:48 [name], [name] ([id number]) -------------------------------------------------------------------------------- Debridement Details Patient Name: Marcia [name] Date of Service: 11/27/2015 10:15 AM Medical Record Number: [id number] Patient Account Number: [id number] Date of Birth/Sex: Mar 02, 1946 (70 y.o. Female) Treating RN: Curtis Sites Primary Care Physician: Sherrie Mustache Other Clinician: Referring Physician: Sherrie Mustache Treating Physician/Extender: Rudene Re in Treatment: 3 Debridement Performed for Wound #1 Lateral Lower Leg Assessment: Performed By: Physician Evlyn Kanner, MD Debridement: Debridement Pre-procedure Yes Verification/Time Out Taken: Start Time: 10:40 Pain Control: Lidocaine 4% Topical Solution Level: Skin/Subcutaneous Tissue Total Area Debrided (L x 0.1 (cm) x 0.1 (cm) = 0.01 (cm) W): Tissue and other Viable, Non-Viable, Exudate, Fibrin/Slough, Skin, Subcutaneous material debrided: Instrument: Forceps Bleeding: None End Time: 10:41 Procedural Pain: 0 Post Procedural Pain: 0 Response to Treatment: Procedure was tolerated well Post Debridement Measurements of Total  Wound Length: (cm) 0.2 Width: (cm) 0.2 Depth: (cm) 0.1 Volume: (cm) 0.003 Post Procedure Diagnosis Same as Pre-procedure Electronic Signature(s) Signed: 11/27/2015 10:43:55 AM By: Evlyn Kanner MD, FACS Signed: 11/27/2015 5:40:21 PM By: Curtis Sites Entered By: Evlyn Kanner on 11/27/2015 10:43:55 [name], [name] Kitchen ([id number]) -------------------------------------------------------------------------------- HPI Details Patient Name: Marcia [name] Date of Service: 11/27/2015 10:15 AM Medical Record Number: [id number] Patient Account Number: [id number] Date of Birth/Sex: Nov 25, 1945 (70 y.o. Female) Treating RN: Curtis Sites Primary Care Physician: Sherrie Mustache Other Clinician: Referring Physician: Sherrie Mustache Treating Physician/Extender: Rudene Re in Treatment: 3 History of Present Illness Location: right lateral lower leg Quality: Patient reports experiencing a dull pain to affected area(s). Severity: Patient states wound are getting worse. Duration: Patient has had the wound for > 6 months prior to seeking treatment at the wound center Timing: Pain in wound is Intermittent (comes and goes Context: The wound appeared gradually over time Modifying Factors: Other treatment(s) tried include: her dermatologist Dr. Adolphus Birchwood has been treating her with local care and some pentoxifylline Associated Signs and Symptoms: Patient reports having increase swelling. HPI Description: 70 year old patient with known history of uncontrolled type 2 diabetes mellitus also has a small ulcer on the right ankle and was referred to as by her PCP. 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 She has given up smoking over 15 years ago. the patient has been under the care of her dermatologist Dr. Adolphus Birchwood who has done a biopsy for  her sometime in October and said there was no cancer. We will try and get these reports from his office and the other workup and treatment he's been doing can be noted. Addendum: received the pathology report from Dr. 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 This atypia was considered reactive. 11/27/2015 --  she did have her venous workup done and saw Dr. dew and the official report is awaited. I understand from conversation with the patient that endovenous laser ablation will be planned for the future Electronic Signature(s) Signed: 11/27/2015 10:46:40 AM By: Evlyn Kanner MD, FACS Entered By: Evlyn Kanner on 11/27/2015 10:46:40 [name], [name] ([id number]) -------------------------------------------------------------------------------- Physical Exam Details Patient Name: Marcia [name] Date of Service: 11/27/2015 10:15 AM Medical Record Number: [id number] Patient Account Number: [id number] Date of Birth/Sex: Nov 07, 1945 (70 y.o. Female) Treating RN: Curtis Sites Primary Care Physician: Sherrie Mustache Other Clinician: Referring Physician: Sherrie Mustache Treating Physician/Extender: Rudene Re in Treatment: 3 Constitutional . Pulse regular. Respirations normal and unlabored. Afebrile. . Eyes Nonicteric. Reactive to light. Ears, Nose, Mouth, and Throat Lips, teeth, and gums WNL.[name] Kitchen Moist mucosa without lesions. Neck supple and nontender. No palpable supraclavicular or cervical adenopathy. Normal sized without goiter. Respiratory WNL. No retractions.. Cardiovascular Pedal Pulses WNL. No clubbing, cyanosis or edema. Chest Breasts symmetical and no nipple discharge.. Breast tissue WNL, no masses, lumps, or tenderness.. Lymphatic No adneopathy. No adenopathy. No adenopathy. Musculoskeletal Adexa without tenderness or  enlargement.. Digits and nails w/o clubbing, cyanosis, infection, petechiae, ischemia, or inflammatory conditions.. Integumentary (Hair, Skin) No suspicious lesions. No crepitus or fluctuance. No peri-wound warmth or erythema. No masses.[name] Kitchen Psychiatric Judgement and insight Intact.. No evidence of depression, anxiety, or agitation.. Notes the redness and inflammation has gone down markedly and this is possibly due to the antifungal treatment. The ulcerated area has some eschar and surrounding exudate and once this was removed subcutaneous debridement was done with a forcep and all the debris removed. Electronic Signature(s) Signed: 11/27/2015 10:47:34 AM By: Evlyn Kanner MD, FACS Entered By: Evlyn Kanner on 11/27/2015 10:47:33 [name], [name] ([id number]) -------------------------------------------------------------------------------- Physician Orders Details Patient Name: Marcia [name] Date of Service: 11/27/2015 10:15 AM Medical Record Number: [id number] Patient Account Number: [id number] Date of Birth/Sex: [birth year] (70 y.o. Female) Treating RN: Curtis Sites Primary Care Physician: Sherrie Mustache Other Clinician: Referring Physician: Sherrie Mustache Treating Physician/Extender: Rudene Re in Treatment: 3 Verbal / Phone Orders: Yes Clinician: Curtis Sites Read Back and Verified: Yes Diagnosis Coding Wound Cleansing Wound #1 Lateral Lower Leg o Cleanse wound with mild soap and water - antibacterial soap o May Shower, gently pat wound dry prior to applying new dressing. Skin Barriers/Peri-Wound Care Wound #1 Lateral Lower Leg o Antifungal cream - lotrisone cream to area daily o Barrier cream - to periwound Primary Wound Dressing Wound #1 Lateral Lower Leg o Aquacel Ag Secondary Dressing Wound #1 Lateral Lower Leg o Gauze and Kerlix/Conform Dressing Change Frequency Wound #1 Lateral Lower Leg o Change dressing every other day. Follow-up  Appointments Wound #1 Lateral Lower Leg o Return Appointment in 1 week. Edema Control Wound #1 Lateral Lower Leg o Support Garment 20-30 mm/Hg pressure to: - patient to wear own compression Electronic Signature(s) Signed: 11/27/2015 2:58:24 PM By: Evlyn Kanner MD, FACS Signed: 11/27/2015 5:40:21 PM By: Lyla Glassing, [name] ([id number]) Entered By: Curtis Sites on 11/27/2015 10:42:37 [name], [name] ([id number]) -------------------------------------------------------------------------------- Prescription 11/27/2015 Patient Name: Marcia [name] Physician: Evlyn Kanner MD Date of Birth: 06-20-1946 NPI#: [id number] Sex: F DEA#: [id number] Phone #: [phone] License #: Patient Address: [facility] [address] [place], [place] [address] [facility] [address] [place], [place] [address] [phone] Allergies no known allergies Physician's Orders Antifungal cream - lotrisone cream to area daily Signature(s): Date(s): Electronic Signature(s) Signed: 11/27/2015  2:58:24 PM By: Evlyn Kanner MD, FACS Signed: 11/27/2015 5:40:21 PM By: Curtis Sites Entered By: Curtis Sites on 11/27/2015 10:42:38 [name], [name] ([id number]) --------------------------------------------------------------------------------  Problem List Details Patient Name: Marcia [name] Date of Service: 11/27/2015 10:15 AM Medical Record Number: [id number] Patient Account Number: [id number] Date of Birth/Sex: 28-Oct-1945 (70 y.o. Female) Treating RN: Curtis Sites Primary Care Physician: Sherrie Mustache Other Clinician: Referring Physician: Sherrie Mustache Treating Physician/Extender: Rudene Re in Treatment: 3 Active Problems ICD-10 Encounter Code Description Active Date Diagnosis E11.622 Type 2 diabetes mellitus with other skin ulcer 11/06/2015 Yes I87.311 Chronic venous hypertension (idiopathic) with ulcer of 11/06/2015  Yes right lower extremity L97.312 Non-pressure chronic ulcer of right ankle with fat layer 11/06/2015 Yes exposed Inactive Problems Resolved Problems Electronic Signature(s) Signed: 11/27/2015 10:43:33 AM By: Evlyn Kanner MD, FACS Entered By: Evlyn Kanner on 11/27/2015 10:43:33 [name], [name] ([id number]) -------------------------------------------------------------------------------- Progress Note Details Patient Name: Marcia [name] Date of Service: 11/27/2015 10:15 AM Medical Record Number: [id number] Patient Account Number: [id number] Date of Birth/Sex: 05/06/1946 (70 y.o. Female) Treating RN: Curtis Sites Primary Care Physician: Sherrie Mustache Other Clinician: Referring Physician: Sherrie Mustache Treating Physician/Extender: Rudene Re in Treatment: 3 Subjective Chief Complaint Information obtained from Patient Patients presents for treatment of an open diabetic ulcer to the right lateral lower third of the leg which she's had for about 6 months History of Present Illness (HPI) The following HPI elements were documented for the patient's wound: Location: right lateral lower leg Quality: Patient reports experiencing a dull pain to affected area(s). Severity: Patient states wound are getting worse. Duration: Patient has had the wound for > 6 months prior to seeking treatment at the wound center Timing: Pain in wound is Intermittent (comes and goes Context: The wound appeared gradually over time Modifying Factors: Other treatment(s) tried include: her dermatologist Dr. Adolphus Birchwood has been treating her with local care and some pentoxifylline Associated Signs and Symptoms: Patient reports having increase swelling. 70 year old patient with known history of uncontrolled type 2 diabetes mellitus also has a small ulcer on the right ankle and was referred to as by her PCP. 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 She has given up smoking over 15 years ago. the patient has been under the care of her dermatologist Dr. Adolphus Birchwood who has done a biopsy for her sometime in October and said there was no cancer. We will try and get these reports from his office and the other workup and treatment he's been doing can be noted. Addendum: received the pathology report from Dr. 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 This atypia was considered reactive. 11/27/2015 -- she did have her venous workup done and saw Dr. dew and the official report is awaited. I understand from conversation with the patient that endovenous laser ablation will be planned for the future [name], [name] ([id number]) Objective Constitutional Pulse regular. Respirations normal and unlabored. Afebrile. Vitals Time Taken: 10:30 AM, Height: 70 in, Weight: 188 lbs, BMI: 27, Temperature: 98.1 F, Pulse: 79 bpm, Respiratory Rate: 18 breaths/min, Blood Pressure: 127/60 mmHg. Eyes Nonicteric. Reactive to light. Ears, Nose, Mouth, and Throat Lips, teeth, and gums WNL.[name] Kitchen Moist mucosa without lesions. Neck supple and nontender. No palpable supraclavicular or cervical adenopathy. Normal sized without  goiter. Respiratory WNL. No retractions.. Cardiovascular Pedal Pulses WNL. No clubbing, cyanosis or edema. Chest Breasts symmetical and no nipple discharge.. Breast tissue WNL, no masses, lumps, or tenderness.. Lymphatic No adneopathy. No adenopathy. No adenopathy. Musculoskeletal Adexa without tenderness or enlargement.. Digits and nails w/o clubbing, cyanosis, infection, petechiae, ischemia, or inflammatory conditions.[name] Kitchen Psychiatric Judgement and insight Intact.. No evidence of  depression, anxiety, or agitation.. General Notes: the redness and inflammation has gone down markedly and this is possibly due to the antifungal treatment. The ulcerated area has some eschar and surrounding exudate and once this was removed subcutaneous debridement was done with a forcep and all the debris removed. Integumentary (Hair, Skin) No suspicious lesions. No crepitus or fluctuance. No peri-wound warmth or erythema. No masses.. Wound #1 status is Open. Original cause of wound was Gradually Appeared. The wound is located on the Lateral Lower Leg. The wound measures 0.1cm length x 0.1cm width x 0.1cm depth; 0.008cm^2 area and 0.001cm^3 volume. The wound is limited to skin breakdown. There is no tunneling or undermining noted. There is a small amount of serosanguineous drainage noted. The wound margin is flat and intact. There is [name], [name] ([id number]) medium (34-66%) red granulation within the wound bed. There is a medium (34-66%) amount of necrotic tissue within the wound bed including Eschar. The periwound skin appearance exhibited: Dry/Scaly, Ecchymosis. The periwound skin appearance did not exhibit: Callus, Crepitus, Excoriation, Fluctuance, Friable, Induration, Localized Edema, Rash, Scarring, Maceration, Moist, Atrophie Blanche, Cyanosis, Hemosiderin Staining, Mottled, Pallor, Rubor, Erythema. The periwound has tenderness on palpation. Assessment Active Problems ICD-10 E11.622 - Type 2 diabetes mellitus with other skin ulcer I87.311 - Chronic venous hypertension (idiopathic) with ulcer of right lower extremity L97.312 - Non-pressure chronic ulcer of right ankle with fat layer exposed Procedures Wound #1 Wound #1 is a Venous Leg Ulcer located on the Lateral Lower Leg . There was a Skin/Subcutaneous Tissue Debridement ([id number]) debridement with total area of 0.01 sq cm performed by Evlyn Kanner, MD. with the following instrument(s): Forceps to remove Viable and  Non-Viable tissue/material including Exudate, Fibrin/Slough, Skin, and Subcutaneous after achieving pain control using Lidocaine 4% Topical Solution. A time out was conducted prior to the start of the procedure. There was no bleeding. The procedure was tolerated well with a pain level of 0 throughout and a pain level of 0 following the procedure. Post Debridement Measurements: 0.2cm length x 0.2cm width x 0.1cm depth; 0.003cm^3 volume. Post procedure Diagnosis Wound #1: Same as Pre-Procedure Plan Wound Cleansing: Wound #1 Lateral Lower Leg: Cleanse wound with mild soap and water - antibacterial soap May Shower, gently pat wound dry prior to applying new dressing. Skin Barriers/Peri-Wound Care: Wound #1 Lateral Lower Leg: Antifungal cream - lotrisone cream to area daily [name], [name] ([id number]) Barrier cream - to periwound Primary Wound Dressing: Wound #1 Lateral Lower Leg: Aquacel Ag Secondary Dressing: Wound #1 Lateral Lower Leg: Gauze and Kerlix/Conform Dressing Change Frequency: Wound #1 Lateral Lower Leg: Change dressing every other day. Follow-up Appointments: Wound #1 Lateral Lower Leg: Return Appointment in 1 week. Edema Control: Wound #1 Lateral Lower Leg: Support Garment 20-30 mm/Hg pressure to: - patient to wear own compression I have asked her to continue with silver alginate and she is wearing her compression stockings and hopefully will improve enough that we can discharge her next week. Electronic Signature(s) Signed: 11/27/2015 10:48:09 AM By: Evlyn Kanner MD, FACS Entered By: Evlyn Kanner on 11/27/2015 10:48:08 [name], [name] ([id number]) -------------------------------------------------------------------------------- SuperBill Details Patient Name: Glace,  Kenyonna A. Date of Service: 11/27/2015 Medical Record Number: [id number] Patient Account Number: [id number] Date of Birth/Sex: 05-17-1946 (70 y.o. Female) Treating RN: Curtis Sites Primary Care Physician:  Sherrie Mustache Other Clinician: Referring Physician: Sherrie Mustache Treating Physician/Extender: Rudene Re in Treatment: 3 Diagnosis Coding ICD-10 Codes Code Description E11.622 Type 2 diabetes mellitus with other skin ulcer I87.311 Chronic venous hypertension (idiopathic) with ulcer of right lower extremity L97.312 Non-pressure chronic ulcer of right ankle with fat layer exposed Facility Procedures CPT4 Code Description: [id number] 11042 - DEB SUBQ TISSUE 20 SQ CM/< ICD-10 Description Diagnosis E11.622 Type 2 diabetes mellitus with other skin ulcer I87.311 Chronic venous hypertension (idiopathic) with ulcer o L97.312 Non-pressure chronic ulcer of  right ankle with fat la Modifier: f right lowe yer exposed Quantity: 1 r extremity Physician Procedures CPT4 Code Description: [id number] 11042 - WC PHYS SUBQ TISS 20 SQ CM ICD-10 Description Diagnosis E11.622 Type 2 diabetes mellitus with other skin ulcer I87.311 Chronic venous hypertension (idiopathic) with ulcer o L97.312 Non-pressure chronic ulcer of  right ankle with fat la Modifier: f right lower yer exposed Quantity: 1 extremity Electronic Signature(s) Signed: 11/27/2015 10:48:17 AM By: Evlyn Kanner MD, FACS Entered By: Evlyn Kanner on 11/27/2015 10:48:17

## 2015-12-04 ENCOUNTER — Encounter: Payer: Medicare HMO | Admitting: Surgery

## 2015-12-04 DIAGNOSIS — E11622 Type 2 diabetes mellitus with other skin ulcer: Secondary | ICD-10-CM | POA: Diagnosis not present

## 2015-12-05 NOTE — Progress Notes (Signed)
REHAM, [name] ([id number]) Visit Report for 12/04/2015 Chief Complaint Document Details Patient Name: Marcia, [name] [name]. Date of Service: 12/04/2015 2:15 PM Medical Record Number: [id number] Patient Account Number: [id number] Date of Birth/Sex: 1946-04-10 (71 y.o. Female) Treating RN: Curtis Sites Primary Care Physician: Sherrie Mustache Other Clinician: Referring Physician: Sherrie Mustache Treating Physician/Extender: Rudene Re in Treatment: 4 Information Obtained from: Patient Chief Complaint Patients presents for treatment of an open diabetic ulcer to the right lateral lower third of the leg which she's had for about 6 months Electronic Signature(s) Signed: 12/04/2015 3:33:16 PM By: Evlyn Kanner MD, FACS Entered By: Evlyn Kanner on 12/04/2015 15:33:16 [place], [name] ([id number]) -------------------------------------------------------------------------------- HPI Details Patient Name: [name]. Date of Service: 12/04/2015 2:15 PM Medical Record Number: [id number] Patient Account Number: [id number] Date of Birth/Sex: 24-Apr-1946 (70 y.o. Female) Treating RN: Curtis Sites Primary Care Physician: Sherrie Mustache Other Clinician: Referring Physician: Sherrie Mustache Treating Physician/Extender: Rudene Re in Treatment: 4 History of Present Illness Location: right lateral lower leg Quality: Patient reports experiencing [name] to affected area(s). Severity: Patient states wound are getting worse. Duration: Patient has had the wound for > 6 months prior to seeking treatment at the wound center Timing: Pain in wound is Intermittent (comes and goes Context: The wound appeared gradually over time Modifying Factors: Other treatment(s) tried include: her dermatologist Dr. Adolphus Birchwood has been treating her with local care and some pentoxifylline Associated Signs and Symptoms: Patient reports having increase swelling. HPI Description: 70 year old patient with known history  of uncontrolled type 2 diabetes mellitus also has [name] on the right ankle and was referred to as by her PCP. 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 She has given up smoking over 15 years ago. the patient has been under the care of her dermatologist Dr. Adolphus Birchwood who has done [name] biopsy for her sometime in October and said there was no cancer. We will try and get these reports from his office and the other workup and treatment he's been doing can be noted. Addendum: received the pathology report from Dr. 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 This atypia was considered reactive. 11/27/2015 -- she did have her venous workup done and saw Dr. Wyn Quaker and the official report is awaited. I understand from conversation with the patient that endovenous laser ablation will be planned for the future 12/04/2015 -- I have received the note from the vascular office and she was seen by Dr. Wyn Quaker on 11/24/2015. The patient had [name] venous duplex which shows reflux within the residual right greater saphenous vein in the right lower leg as well as reflux and [name] large saphenous vein branches. He has recommended treatment of [name] venous disease with laser ablation to the residual right greater saphenous vein as well as the patient should have some sclerotherapy for the residual varicosities. He is going to schedule this in the near future. Electronic Signature(s) Signed: 12/04/2015 3:36:13 PM By: Evlyn Kanner MD, FACS Entered By: Evlyn Kanner on 12/04/2015 15:36:13 [name], [name] ([id number]) -------------------------------------------------------------------------------- Physical Exam  Details Patient Name: [name]. Date of Service: 12/04/2015 2:15 PM Medical Record Number: [id number] Patient Account Number: [id number] Date of Birth/Sex: [birth year] (70 y.o. Female) Treating RN: Curtis Sites Primary Care Physician:  Sherrie Mustache Other Clinician: Referring Physician: Sherrie Mustache Treating Physician/Extender: Rudene Re in Treatment: 4 Constitutional . Pulse regular. Respirations normal and unlabored. Afebrile. . Eyes Nonicteric. Reactive to light. Ears, Nose, Mouth, and Throat Lips, teeth, and gums WNL.[name] Kitchen Moist mucosa without lesions. Neck supple and nontender. No palpable supraclavicular or cervical adenopathy. Normal sized without goiter. Respiratory WNL. No retractions.. Cardiovascular Pedal Pulses WNL. No clubbing, cyanosis or edema. Chest Breasts symmetical and no nipple discharge.. Breast tissue WNL, no masses, lumps, or tenderness.. Lymphatic No adneopathy. No adenopathy. No adenopathy. Musculoskeletal Adexa without tenderness or enlargement.. Digits and nails w/o clubbing, cyanosis, infection, petechiae, ischemia, or inflammatory conditions.. Integumentary (Hair, Skin) No suspicious lesions. No crepitus or fluctuance. No peri-wound warmth or erythema. No masses.[name] Kitchen Psychiatric Judgement and insight Intact.. No evidence of depression, anxiety, or agitation.. Notes the inflammation is completely gone down and the ulcer is healed completely with no open ulceration visible. Electronic Signature(s) Signed: 12/04/2015 3:36:47 PM By: Evlyn Kanner MD, FACS Entered By: Evlyn Kanner on 12/04/2015 15:36:46 [name], [name] ([id number]) -------------------------------------------------------------------------------- Physician Orders Details Patient Name: [name]. Date of Service: 12/04/2015 2:15 PM Medical Record Number: [id number] Patient Account Number: [id number] Date of Birth/Sex: [birth year] (70 y.o. Female) Treating RN: Curtis Sites Primary Care Physician: Sherrie Mustache Other Clinician: Referring Physician: Sherrie Mustache Treating Physician/Extender: Rudene Re in Treatment: 4 Verbal / Phone Orders: Yes Clinician: Curtis Sites Read Back and Verified: Yes Diagnosis Coding Discharge From [facility] o Discharge from Wound Care Center - continue wearing compression hose from morning until bedtime unless showering Electronic Signature(s) Signed: 12/04/2015 4:45:18 PM By: Evlyn Kanner MD, FACS Signed: 12/04/2015 4:52:49 PM By: Curtis Sites Entered By: Curtis Sites on 12/04/2015 14:55:53 [name], Marcia AMarland Kitchen ([id number]) -------------------------------------------------------------------------------- Problem List Details Patient Name: [name]. Date of Service: 12/04/2015 2:15 PM Medical Record Number: [id number] Patient Account Number: [id number] Date of Birth/Sex: 1946/04/20 (70 y.o. Female) Treating RN: Curtis Sites Primary Care Physician: Sherrie Mustache Other Clinician: Referring Physician: Sherrie Mustache Treating Physician/Extender: Rudene Re in Treatment: 4 Active Problems ICD-10 Encounter Code Description Active Date Diagnosis E11.622 Type 2 diabetes mellitus with other skin ulcer 11/06/2015 Yes I87.311 Chronic venous hypertension (idiopathic) with ulcer of 11/06/2015 Yes right lower extremity L97.312 Non-pressure chronic ulcer of right ankle with fat layer 11/06/2015 Yes exposed Inactive Problems Resolved Problems Electronic Signature(s) Signed: 12/04/2015 3:33:11 PM By: Evlyn Kanner MD, FACS Entered By: Evlyn Kanner on 12/04/2015 15:33:11 [name], [name] ([id number]) -------------------------------------------------------------------------------- Progress Note Details Patient Name: [name]. Date of Service: 12/04/2015 2:15 PM Medical Record Number: [id number] Patient Account Number: [id number] Date of Birth/Sex: [birth year] (70 y.o. Female) Treating RN:  Curtis Sites Primary Care Physician: Sherrie Mustache Other Clinician: Referring Physician: Sherrie Mustache Treating Physician/Extender: Rudene Re in Treatment: 4 Subjective Chief Complaint Information obtained from Patient Patients presents for treatment of an open diabetic ulcer to the right lateral lower third of the leg which she's had for about 6 months History of Present Illness (HPI) The following HPI elements were documented for the patient's wound: Location: right lateral lower leg Quality: Patient reports experiencing [name] to affected area(s). Severity: Patient states wound are getting worse. Duration: Patient has had the wound for > 6 months prior to seeking treatment at the wound center Timing: Pain in wound is Intermittent (comes and goes Context: The wound appeared gradually over time Modifying Factors: Other treatment(s) tried include: her dermatologist Dr. Adolphus Birchwood has been treating her with local care and  some pentoxifylline Associated Signs and Symptoms: Patient reports having increase swelling. 70 year old patient with known history of uncontrolled type 2 diabetes mellitus also has [name] on the right ankle and was referred to as by her PCP. 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 She has given up smoking over 15 years ago. the patient has been under the care of her dermatologist Dr. Adolphus Birchwood who has done [name] biopsy for her sometime in October and said there was no cancer. We will try and get these reports from his office and the other workup and treatment he's been doing can be noted. Addendum: received the pathology report from Dr. Durene Cal office which shows on 07/13/2015 right ankle punch biopsy was done which shows ulcerating dermatitis with noninflammatory vasculopathy  consistent with atrophie blanche and squamous atypia. This atypia was considered reactive. 11/27/2015 -- she did have her venous workup done and saw Dr. Wyn Quaker and the official report is awaited. I understand from conversation with the patient that endovenous laser ablation will be planned for the future 12/04/2015 -- I have received the note from the vascular office and she was seen by Dr. Wyn Quaker on 11/24/2015. The patient had [name] venous duplex which shows reflux within the residual right greater saphenous vein in the right lower leg as well as reflux and [name] large saphenous vein branches. He has recommended treatment of [name] venous disease with laser ablation to the residual right greater saphenous vein as well as the patient should have some sclerotherapy for the residual varicosities. He is going to schedule this in the near future. Marcia, KRUG [name]. ([id number]) Objective Constitutional Pulse regular. Respirations normal and unlabored. Afebrile. Vitals Time Taken: 2:18 PM, Height: 70 in, Weight: 188 lbs, BMI: 27, Temperature: 98.5 F, Pulse: 72 bpm, Respiratory Rate: 18 breaths/min, Blood Pressure: 138/52 mmHg. Eyes Nonicteric. Reactive to light. Ears, Nose, Mouth, and Throat Lips, teeth, and gums WNL.[name] Kitchen Moist mucosa without lesions. Neck supple and nontender. No palpable supraclavicular or cervical adenopathy. Normal sized without goiter. Respiratory WNL. No retractions.. Cardiovascular Pedal Pulses WNL. No clubbing, cyanosis or edema. Chest Breasts symmetical and no nipple discharge.. Breast tissue WNL, no masses, lumps, or tenderness.. Lymphatic No adneopathy. No adenopathy. No adenopathy. Musculoskeletal Adexa without tenderness or enlargement.. Digits and nails w/o clubbing, cyanosis, infection, petechiae, ischemia, or inflammatory conditions.[name] Kitchen Psychiatric Judgement and insight Intact.. No evidence of depression, anxiety, or agitation.. General Notes: the inflammation is completely gone  down and the ulcer is healed completely with no open ulceration visible. Integumentary (Hair, Skin) No suspicious lesions. No crepitus or fluctuance. No peri-wound warmth or erythema. No masses.[name] Kitchen Marcia, EKE [name]. ([id number]) Wound #1 status is Healed - Epithelialized. Original cause of wound was Gradually Appeared. The wound is located on the Lateral Lower Leg. The wound measures 0cm length x 0cm width x 0cm depth; 0cm^2 area and 0cm^3 volume. The wound is limited to skin breakdown. There is no tunneling or undermining noted. There is [name] small amount of serosanguineous drainage noted. The wound margin is flat and intact. There is large (67-100%) red granulation within the wound bed. There is no necrotic tissue within the wound bed. The periwound skin appearance exhibited: Dry/Scaly, Ecchymosis. The periwound skin appearance did not exhibit: Callus, Crepitus, Excoriation, Fluctuance, Friable, Induration, Localized Edema, Rash, Scarring, Maceration, Moist, Atrophie Blanche, Cyanosis, Hemosiderin Staining, Mottled, Pallor, Rubor, Erythema. The periwound has tenderness  on palpation. 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 She has an appointment to see Dr. Wyn Quaker for endovenous ablation and sclerotherapy and she should keep this. She is discharged from the wound care services and will be seen back on an as-needed basis. Plan Discharge From [facility]: Discharge from Wound Care Center - continue wearing compression hose from morning until  bedtime unless showering [name], [name]. ([id number]) I have discussed with the patient that the wound is completely healed and that she should wear [name] protective foam pad over the newly formed scar and continue to wear her compression stockings of the 20-30 mm variety or day long except for the night. She has an appointment to see Dr. Wyn Quaker for endovenous ablation and sclerotherapy and she should keep this. She is discharged from the wound care services and will be seen back on an as-needed basis. Electronic Signature(s) Signed: 12/04/2015 3:37:50 PM By: Evlyn Kanner MD, FACS Entered By: Evlyn Kanner on 12/04/2015 15:37:49 [name], [name] ([id number]) -------------------------------------------------------------------------------- SuperBill Details Patient Name: [name]. Date of Service: 12/04/2015 Medical Record Number: [id number] Patient Account Number: [id number] Date of Birth/Sex: November 07, 1945 (70 y.o. Female) Treating RN: Curtis Sites Primary Care Physician: Sherrie Mustache Other Clinician: Referring Physician: Sherrie Mustache Treating Physician/Extender: Rudene Re in Treatment: 4 Diagnosis Coding ICD-10 Codes Code Description E11.622 Type 2 diabetes mellitus with other skin ulcer I87.311 Chronic venous hypertension (idiopathic) with ulcer of right lower extremity L97.312 Non-pressure chronic ulcer of right ankle with fat layer exposed Facility Procedures CPT4 Code: [id number] Description: [phone] - WOUND CARE VISIT-LEV 2 EST PT Modifier: Quantity: 1 Physician Procedures CPT4 Code Description: [id number] - WC PHYS LEVEL 3 - EST PT ICD-10 Description Diagnosis E11.622 Type 2 diabetes mellitus with other skin ulcer I87.311 Chronic venous hypertension (idiopathic) with ulcer o L97.312 Non-pressure chronic ulcer of right  ankle with fat la Modifier: f right lower yer exposed Quantity: 1 extremity Electronic Signature(s) Signed: 12/04/2015 3:38:00 PM By: Evlyn Kanner  MD, FACS Entered By: Evlyn Kanner on 12/04/2015 15:37:59

## 2015-12-05 NOTE — Progress Notes (Signed)
[name], [name] ([id number]) Visit Report for 12/04/2015 Arrival Information Details Patient Name: [name], [name]. Date of Service: 12/04/2015 2:15 PM Medical Record Number: [id number] Patient Account Number: [id number] Date of Birth/Sex: 01/27/1946 (70 y.o. Female) Treating RN: Curtis Sites Primary Care Physician: Sherrie Mustache Other Clinician: Referring Physician: Sherrie Mustache Treating Physician/Extender: Rudene Re in Treatment: 4 Visit Information History Since Last Visit Added or deleted any medications: No Patient Arrived: Ambulatory Any new allergies or adverse reactions: No Arrival Time: 14:16 Had [name] fall or experienced change in No Accompanied By: self activities of daily living that may affect Transfer Assistance: None risk of falls: Patient Identification Verified: Yes Signs or symptoms of abuse/neglect since last No Secondary Verification Process Yes visito Completed: Hospitalized since last visit: No Patient Requires Transmission-Based No Pain Present Now: No Precautions: Patient Has Alerts: Yes Electronic Signature(s) Signed: 12/04/2015 4:52:49 PM By: Curtis Sites Entered By: Curtis Sites on 12/04/2015 14:17:30 [name], [name] ([id number]) -------------------------------------------------------------------------------- Clinic Level of Care Assessment Details Patient Name: Marcia Bray [name]. Date of Service: 12/04/2015 2:15 PM Medical Record Number: [id number] Patient Account Number: [id number] Date of Birth/Sex: 1945-12-22 (70 y.o. Female) Treating RN: Curtis Sites Primary Care Physician: Sherrie Mustache Other Clinician: Referring Physician: Sherrie Mustache Treating Physician/Extender: Rudene Re in Treatment: 4 Clinic Level of Care Assessment Items TOOL 4 Quantity Score []  - Use when only an EandM is performed on FOLLOW-UP visit 0 ASSESSMENTS - Nursing Assessment / Reassessment X - Reassessment of Co-morbidities (includes updates in  patient status) 1 10 X - Reassessment of Adherence to Treatment Plan 1 5 ASSESSMENTS - Wound and Skin Assessment / Reassessment X - Simple Wound Assessment / Reassessment - one wound 1 5 []  - Complex Wound Assessment / Reassessment - multiple wounds 0 []  - Dermatologic / Skin Assessment (not related to wound area) 0 ASSESSMENTS - Focused Assessment []  - Circumferential Edema Measurements - multi extremities 0 []  - Nutritional Assessment / Counseling / Intervention 0 X - Lower Extremity Assessment (monofilament, tuning fork, pulses) 1 5 []  - Peripheral Arterial Disease Assessment (using hand held doppler) 0 ASSESSMENTS - Ostomy and/or Continence Assessment and Care []  - Incontinence Assessment and Management 0 []  - Ostomy Care Assessment and Management (repouching, etc.) 0 PROCESS - Coordination of Care X - Simple Patient / Family Education for ongoing care 1 15 []  - Complex (extensive) Patient / Family Education for ongoing care 0 []  - Staff obtains Chiropractor, Records, Test Results / Process Orders 0 []  - Staff telephones HHA, Nursing Homes / Clarify orders / etc 0 []  - Routine Transfer to another Facility (non-emergent condition) 0 [name], [name]. ([id number]) []  - Routine Hospital Admission (non-emergent condition) 0 []  - New Admissions / Manufacturing engineer / Ordering NPWT, Apligraf, etc. 0 []  - Emergency Hospital Admission (emergent condition) 0 X - Simple Discharge Coordination 1 10 []  - Complex (extensive) Discharge Coordination 0 PROCESS - Special Needs []  - Pediatric / Minor Patient Management 0 []  - Isolation Patient Management 0 []  - Hearing / Language / Visual special needs 0 []  - Assessment of Community assistance (transportation, D/C planning, etc.) 0 []  - Additional assistance / Altered mentation 0 []  - Support Surface(s) Assessment (bed, cushion, seat, etc.) 0 INTERVENTIONS - Wound Cleansing / Measurement X - Simple Wound Cleansing - one wound 1 5 []  - Complex  Wound Cleansing - multiple wounds 0 X - Wound Imaging (photographs - any number of wounds) 1 5 []  - Wound Tracing (instead of photographs) 0 X -  Simple Wound Measurement - one wound 1 5  - Complex Wound Measurement - multiple wounds 0 INTERVENTIONS - Wound Dressings  - Small Wound Dressing one or multiple wounds 0  - Medium Wound Dressing one or multiple wounds 0  - Large Wound Dressing one or multiple wounds 0  - Application of Medications - topical 0  - Application of Medications - injection 0 INTERVENTIONS - Miscellaneous  - External ear exam 0 [name], [name]. ([id number])  - Specimen Collection (cultures, biopsies, blood, body fluids, etc.) 0  - Specimen(s) / Culture(s) sent or taken to Lab for analysis 0  - Patient Transfer (multiple staff / Michiel Sites Lift / Similar devices) 0  - Simple Staple / Suture removal (25 or less) 0  - Complex Staple / Suture removal (26 or more) 0  - Hypo / Hyperglycemic Management (close monitor of Blood Glucose) 0  - Ankle / Brachial Index (ABI) - do not check if billed separately 0 X - Vital Signs 1 5 Has the patient been seen at the hospital within the last three years: Yes Total Score: 70 Level Of Care: New/Established - Level 2 Electronic Signature(s) Signed: 12/04/2015 4:52:49 PM By: Curtis Sites Entered By: Curtis Sites on 12/04/2015 14:56:29 [name], [name] ([id number]) -------------------------------------------------------------------------------- Encounter Discharge Information Details Patient Name: Marcia Bray [name]. Date of Service: 12/04/2015 2:15 PM Medical Record Number: [id number] Patient Account Number: [id number] Date of Birth/Sex: June 02, 1946 (70 y.o. Female) Treating RN: Curtis Sites Primary Care Physician: Sherrie Mustache Other Clinician: Referring Physician: Sherrie Mustache Treating Physician/Extender: Rudene Re in Treatment: 4 Encounter Discharge Information Items Discharge Pain Level:  0 Discharge Condition: Stable Ambulatory Status: Ambulatory Discharge Destination: Home Transportation: Private Auto Accompanied By: self Schedule Follow-up Appointment: No Medication Reconciliation completed and provided to Patient/Care No Ilma Achee: Provided on Clinical Summary of Care: 12/04/2015 Form Type Recipient Paper Patient SP Electronic Signature(s) Signed: 12/04/2015 3:04:03 PM By: Gwenlyn Perking Entered By: Gwenlyn Perking on 12/04/2015 15:04:03 [name], Marcia AMarland Kitchen ([id number]) -------------------------------------------------------------------------------- Lower Extremity Assessment Details Patient Name: Marcia Bray [name]. Date of Service: 12/04/2015 2:15 PM Medical Record Number: [id number] Patient Account Number: [id number] Date of Birth/Sex: Nov 22, 1945 (70 y.o. Female) Treating RN: Curtis Sites Primary Care Physician: Sherrie Mustache Other Clinician: Referring Physician: Sherrie Mustache Treating Physician/Extender: Rudene Re in Treatment: 4 Edema Assessment Assessed: [Left: No] [Right: No] Edema: [Left: Ye] [Right: s] Vascular Assessment Pulses: Posterior Tibial Dorsalis Pedis Palpable: [Right:Yes] Extremity colors, hair growth, and conditions: Extremity Color: [Right:Normal] Hair Growth on Extremity: [Right:No] Temperature of Extremity: [Right:Warm] Capillary Refill: [Right:< 3 seconds] Electronic Signature(s) Signed: 12/04/2015 4:52:49 PM By: Curtis Sites Entered By: Curtis Sites on 12/04/2015 14:21:27 [name], Marcia AMarland Kitchen ([id number]) -------------------------------------------------------------------------------- Multi Wound Chart Details Patient Name: Marcia Bray [name]. Date of Service: 12/04/2015 2:15 PM Medical Record Number: [id number] Patient Account Number: [id number] Date of Birth/Sex: [birth year] (70 y.o. Female) Treating RN: Curtis Sites Primary Care Physician: Sherrie Mustache Other Clinician: Referring Physician: Sherrie Mustache Treating  Physician/Extender: Rudene Re in Treatment: 4 Vital Signs Height(in): 70 Pulse(bpm): 72 Weight(lbs): 188 Blood Pressure 138/52 (mmHg): Body Mass Index(BMI): 27 Temperature(F): 98.5 Respiratory Rate 18 (breaths/min): Photos: [1:No Photos] [N/[name]:N/[name]] Wound Location: [1:Lateral Lower Leg] [N/[name]:N/[name]] Wounding Event: [1:Gradually Appeared] [N/[name]:N/[name]] Primary Etiology: [1:Venous Leg Ulcer] [N/[name]:N/[name]] Secondary Etiology: [1:Diabetic Wound/Ulcer of the Lower Extremity] [N/[name]:N/[name]] Comorbid History: [1:Hypertension, Type II Diabetes, Neuropathy] [N/[name]:N/[name]] Date Acquired: [1:04/07/2015] [N/[name]:N/[name]] Weeks of Treatment: [1:4] [N/[name]:N/[name]] Wound Status: [1:Healed - Epithelialized] [N/[name]:N/[name]] Measurements L x W x D 0x0x0 [N/[name]:N/[name]] (cm) Area (cm) : [  1:0] [N/[name]:N/[name]] Volume (cm) : [1:0] [N/[name]:N/[name]] % Reduction in Area: [1:100.00%] [N/[name]:N/[name]] % Reduction in Volume: 100.00% [N/[name]:N/[name]] Classification: [1:Partial Thickness] [N/[name]:N/[name]] HBO Classification: [1:Grade 1] [N/[name]:N/[name]] Exudate Amount: [1:Small] [N/[name]:N/[name]] Exudate Type: [1:Serosanguineous] [N/[name]:N/[name]] Exudate Color: [1:red, brown] [N/[name]:N/[name]] Wound Margin: [1:Flat and Intact] [N/[name]:N/[name]] Granulation Amount: [1:Large (67-100%)] [N/[name]:N/[name]] Granulation Quality: [1:Red] [N/[name]:N/[name]] Necrotic Amount: [1:None Present (0%)] [N/[name]:N/[name]] Exposed Structures: [1:Fascia: No Fat: No Tendon: No Muscle: No] [N/[name]:N/[name]] Joint: No Bone: No Limited to Skin Breakdown Epithelialization: Large (67-100%) N/[name] N/[name] Periwound Skin Texture: Edema: No N/[name] N/[name] Excoriation: No Induration: No Callus: No Crepitus: No Fluctuance: No Friable: No Rash: No Scarring: No Periwound Skin Dry/Scaly: Yes N/[name] N/[name] Moisture: Maceration: No Moist: No Periwound Skin Color: Ecchymosis: Yes N/[name] N/[name] Atrophie Blanche: No Cyanosis: No Erythema: No Hemosiderin Staining: No Mottled: No Pallor: No Rubor: No Tenderness on Yes N/[name] N/[name] Palpation: Wound Preparation: Ulcer Cleansing: N/[name]  N/[name] Rinsed/Irrigated with Saline Topical Anesthetic Applied: Other: lidocaine 4% Treatment Notes Electronic Signature(s) Signed: 12/04/2015 4:52:49 PM By: Curtis Sites Entered By: Curtis Sites on 12/04/2015 14:55:23 [name], [name] ([id number]) -------------------------------------------------------------------------------- Multi-Disciplinary Care Plan Details Patient Name: Marcia Bray [name]. Date of Service: 12/04/2015 2:15 PM Medical Record Number: [id number] Patient Account Number: [id number] Date of Birth/Sex: 1946/01/04 (70 y.o. Female) Treating RN: Curtis Sites Primary Care Physician: Sherrie Mustache Other Clinician: Referring Physician: Sherrie Mustache Treating Physician/Extender: Rudene Re in Treatment: 4 Active Inactive Electronic Signature(s) Signed: 12/04/2015 4:52:49 PM By: Curtis Sites Entered By: Curtis Sites on 12/04/2015 14:55:17 [name], [name] ([id number]) -------------------------------------------------------------------------------- Patient/Caregiver Education Details Patient Name: Marcia Bray [name]. Date of Service: 12/04/2015 2:15 PM Medical Record Number: [id number] Patient Account Number: [id number] Date of Birth/Gender: [birth year] (70 y.o. Female) Treating RN: Curtis Sites Primary Care Physician: Sherrie Mustache Other Clinician: Referring Physician: Sherrie Mustache Treating Physician/Extender: Rudene Re in Treatment: 4 Education Assessment Education Provided To: Patient Education Topics Provided Basic Hygiene: Handouts: Other: skin care of newly healed ulcer site Methods: Demonstration, Explain/Verbal Responses: State content correctly Electronic Signature(s) Signed: 12/04/2015 4:52:49 PM By: Curtis Sites Entered By: Curtis Sites on 12/04/2015 15:02:04 [name], [name]. ([id number]) -------------------------------------------------------------------------------- Wound Assessment Details Patient Name: Marcia Bray [name]. Date of  Service: 12/04/2015 2:15 PM Medical Record Number: [id number] Patient Account Number: [id number] Date of Birth/Sex: 11-Feb-1946 (70 y.o. Female) Treating RN: Curtis Sites Primary Care Physician: Sherrie Mustache Other Clinician: Referring Physician: Sherrie Mustache Treating Physician/Extender: Rudene Re in Treatment: 4 Wound Status Wound Number: 1 Primary Etiology: Venous Leg Ulcer Wound Location: Lateral Lower Leg Secondary Diabetic Wound/Ulcer of the Lower Etiology: Extremity Wounding Event: Gradually Appeared Wound Status: Healed - Epithelialized Date Acquired: 04/07/2015 Comorbid Hypertension, Type II Diabetes, Weeks Of Treatment: 4 History: Neuropathy Clustered Wound: No Photos Photo Uploaded By: Curtis Sites on 12/04/2015 15:11:17 Wound Measurements Length: (cm) 0 % Reduction Width: (cm) 0 % Reduction Depth: (cm) 0 Epitheliali Area: (cm) 0 Tunneling: Volume: (cm) 0 Underminin in Area: 100% in Volume: 100% zation: Large (67-100%) No g: No Wound Description Classification: Partial Thickness Foul Odor Diabetic Severity (Wagner): Grade 1 Wound Margin: Flat and Intact Exudate Amount: Small Exudate Type: Serosanguineous Exudate Color: red, brown After Cleansing: No Wound Bed Granulation Amount: Large (67-100%) Exposed Structure Granulation Quality: Red Fascia Exposed: No Necrotic Amount: None Present (0%) Fat Layer Exposed: No [name], [name]. ([id number]) Tendon Exposed: No Muscle Exposed: No Joint Exposed: No Bone Exposed: No Limited to Skin Breakdown Periwound Skin Texture Texture Color No Abnormalities Noted: No No Abnormalities Noted: No Callus: No Atrophie  Blanche: No Crepitus: No Cyanosis: No Excoriation: No Ecchymosis: Yes Fluctuance: No Erythema: No Friable: No Hemosiderin Staining: No Induration: No Mottled: No Localized Edema: No Pallor: No Rash: No Rubor: No Scarring: No Temperature / Pain Moisture Tenderness on Palpation:  Yes No Abnormalities Noted: No Dry / Scaly: Yes Maceration: No Moist: No Wound Preparation Ulcer Cleansing: Rinsed/Irrigated with Saline Topical Anesthetic Applied: Other: lidocaine 4%, Electronic Signature(s) Signed: 12/04/2015 4:52:49 PM By: Curtis Sites Entered By: Curtis Sites on 12/04/2015 14:54:57 [name], [name] ([id number]) -------------------------------------------------------------------------------- Vitals Details Patient Name: Marcia Bray [name]. Date of Service: 12/04/2015 2:15 PM Medical Record Number: [id number] Patient Account Number: [id number] Date of Birth/Sex: Oct 06, 1946 (70 y.o. Female) Treating RN: Curtis Sites Primary Care Physician: Sherrie Mustache Other Clinician: Referring Physician: Sherrie Mustache Treating Physician/Extender: Rudene Re in Treatment: 4 Vital Signs Time Taken: 14:18 Temperature (F): 98.5 Height (in): 70 Pulse (bpm): 72 Weight (lbs): 188 Respiratory Rate (breaths/min): 18 Body Mass Index (BMI): 27 Blood Pressure (mmHg): 138/52 Reference Range: 80 - 120 mg / dl Electronic Signature(s) Signed: 12/04/2015 4:52:49 PM By: Curtis Sites Entered By: Curtis Sites on 12/04/2015 14:18:59

## 2015-12-22 ENCOUNTER — Other Ambulatory Visit: Payer: Self-pay | Admitting: Internal Medicine

## 2015-12-22 DIAGNOSIS — Z1231 Encounter for screening mammogram for malignant neoplasm of breast: Secondary | ICD-10-CM

## 2015-12-28 ENCOUNTER — Other Ambulatory Visit: Payer: Self-pay | Admitting: Internal Medicine

## 2015-12-28 ENCOUNTER — Ambulatory Visit
Admission: RE | Admit: 2015-12-28 | Discharge: 2015-12-28 | Disposition: A | Discharge status: Home / Self Care | Payer: Medicare HMO | Source: Ambulatory Visit | Attending: Internal Medicine | Admitting: Internal Medicine

## 2015-12-28 DIAGNOSIS — Z1231 Encounter for screening mammogram for malignant neoplasm of breast: Secondary | ICD-10-CM

## 2016-10-22 ENCOUNTER — Ambulatory Visit (INDEPENDENT_AMBULATORY_CARE_PROVIDER_SITE_OTHER): Payer: Medicare HMO | Admitting: Vascular Surgery

## 2016-10-22 ENCOUNTER — Encounter (INDEPENDENT_AMBULATORY_CARE_PROVIDER_SITE_OTHER): Payer: Self-pay | Admitting: Vascular Surgery

## 2016-10-22 VITALS — BP 140/70 | HR 80 | Resp 16 | Ht 70.0 in | Wt 190.0 lb

## 2016-10-22 DIAGNOSIS — E119 Type 2 diabetes mellitus without complications: Secondary | ICD-10-CM | POA: Insufficient documentation

## 2016-10-22 DIAGNOSIS — M7989 Other specified soft tissue disorders: Secondary | ICD-10-CM | POA: Diagnosis not present

## 2016-10-22 DIAGNOSIS — M79604 Pain in right leg: Secondary | ICD-10-CM

## 2016-10-22 DIAGNOSIS — E118 Type 2 diabetes mellitus with unspecified complications: Secondary | ICD-10-CM | POA: Diagnosis not present

## 2016-10-22 DIAGNOSIS — I872 Venous insufficiency (chronic) (peripheral): Secondary | ICD-10-CM | POA: Diagnosis not present

## 2016-10-22 DIAGNOSIS — I10 Essential (primary) hypertension: Secondary | ICD-10-CM | POA: Insufficient documentation

## 2016-10-22 DIAGNOSIS — M79605 Pain in left leg: Secondary | ICD-10-CM

## 2016-10-22 DIAGNOSIS — M79609 Pain in unspecified limb: Secondary | ICD-10-CM | POA: Insufficient documentation

## 2016-10-22 NOTE — Assessment & Plan Note (Signed)
blood glucose control important in reducing the progression of atherosclerotic disease. Also, involved in wound healing. On appropriate medications.  

## 2016-10-22 NOTE — Assessment & Plan Note (Signed)
blood pressure control important in reducing the progression of atherosclerotic disease. On appropriate oral medications.  

## 2016-10-22 NOTE — Assessment & Plan Note (Signed)
The patient has a previous history of venous ablation on the right lower extremity. She has worsening varicosities on the right leg as well as the pain and swelling in the legs. It is possible that there are other issues such as lymphedema or autoimmune diseases, but given her previous venous history of think it is important to assess this with a venous reflux study. I discussed the pathophysiology and natural history of venous disease. I will see her back following her study. 

## 2016-10-22 NOTE — Assessment & Plan Note (Signed)
The patient has a previous history of venous ablation on the right lower extremity. She has worsening varicosities on the right leg as well as the pain and swelling in the legs. It is possible that there are other issues such as lymphedema or autoimmune diseases, but given her previous venous history of think it is important to assess this with a venous reflux study. I discussed the pathophysiology and natural history of venous disease. I will see her back following her study.

## 2016-10-22 NOTE — Assessment & Plan Note (Signed)
Likely multifactorial. Suspect there is a component of neuropathy as well as possible autoimmune/dermatologic disease that is very painful. Venous insufficiency may be contributing. See workup as planned above.

## 2016-10-22 NOTE — Progress Notes (Addendum)
MRN : [id number]  [name] is a 71 y.o. ([birth year]) female who presents with chief complaint of  Chief Complaint  Patient presents with  . Follow-up  .  History of Present Illness: Patient returns today in follow up of Lower extremity swelling, venous disease, and a worsening rash on the lower leg. Both lower extremities are affected and both have a noticeable, red, raised rash in the posterior and lateral portions of the lower leg. This has been steadily progressing despite care from a dermatologist. It is painful but she also has neuropathy. Neurontin has helped her neuropathy. Swelling is associated factor in his also been steadily progressing. She has previously had venous intervention on the right leg in the past but has not had this checked in some time. She does not have a history of DVT or superficial thrombophlebitis to her knowledge. She denies fever, chills, chest pain or shortness of breath.  Current Outpatient Prescriptions  Medication Sig Dispense Refill  . amLODipine-benazepril (LOTREL) 10-20 MG capsule Take by mouth.    [name] Kitchen aspirin EC 81 MG tablet Take 81 mg by mouth daily.    . benazepril (LOTENSIN) 20 MG tablet Take 20 mg by mouth daily.  0  . glipiZIDE (GLUCOTROL XL) 5 MG 24 hr tablet Take 5 mg by mouth 2 (two) times daily.  0  . hydrochlorothiazide (HYDRODIURIL) 25 MG tablet Take 25 mg by mouth 2 (two) times daily.  1  . KOMBIGLYZE XR 2.02-999 MG TB24 TAKE 1 TABLET 2 TIMES A DAY ORALLY  4  . metoprolol succinate (TOPROL-XL) 50 MG 24 hr tablet TAKE 1 TABLET ONCE A DAY ORALLY  0  . pentoxifylline (TRENTAL) 400 MG CR tablet Take 400 mg by mouth.    . Vitamin D, Ergocalciferol, (DRISDOL) 50000 units CAPS capsule Take 50,000 Units by mouth once a week.  3   No current facility-administered medications for this visit.     Past Medical History:  Diagnosis Date  . Diabetes mellitus without complication (HCC)   . Hyperlipidemia   . Hypertension     History reviewed.  No pertinent surgical history.  Social History Social History  Substance Use Topics  . Smoking status: Former Games developer  . Smokeless tobacco: Never Used  . Alcohol use No  No IV drug use  Family History Family History  Problem Relation Age of Onset  . Breast cancer Sister 74  No bleeding disorders, clotting disorders, or autoimmune diseases  No Known Allergies   REVIEW OF SYSTEMS (Negative unless checked)  Constitutional: [] Weight loss  [] Fever  [] Chills Cardiac: [] Chest pain   [] Chest pressure   [] Palpitations   [] Shortness of breath when laying flat   [] Shortness of breath at rest   [] Shortness of breath with exertion. Vascular:  [] Pain in legs with walking   [] Pain in legs at rest   [] Pain in legs when laying flat   [] Claudication   [] Pain in feet when walking  [] Pain in feet at rest  [] Pain in feet when laying flat   [] History of DVT   [] Phlebitis   [x] Swelling in legs   [x] Varicose veins   [] Non-healing ulcers Pulmonary:   [] Uses home oxygen   [] Productive cough   [] Hemoptysis   [] Wheeze  [] COPD   [] Asthma Neurologic:  [] Dizziness  [] Blackouts   [] Seizures   [] History of stroke   [] History of TIA  [] Aphasia   [] Temporary blindness   [] Dysphagia   [] Weakness or numbness in arms   [] Weakness or  numbness in legs Musculoskeletal:  [] Arthritis   [] Joint swelling   [] Joint pain   [] Low back pain Hematologic:  [] Easy bruising  [] Easy bleeding   [] Hypercoagulable state   [] Anemic   Gastrointestinal:  [] Blood in stool   [] Vomiting blood  [] Gastroesophageal reflux/heartburn   [] Abdominal pain Genitourinary:  [] Chronic kidney disease   [] Difficult urination  [] Frequent urination  [] Burning with urination   [] Hematuria Skin:  [] Rashes   [] Ulcers   [] Wounds Psychological:  [] History of anxiety   []  History of major depression.  Physical Examination  BP 140/70   Pulse 80   Resp 16   Ht 5\' 10"  (1.778 m)   Wt 190 lb (86.2 kg)   BMI 27.26 kg/m  Gen:  WD/WN, NAD Head: [place]/AT, No temporalis  wasting. Ear/Nose/Throat: Hearing grossly intact, nares w/o erythema or drainage, trachea midline Eyes: Conjunctiva clear. Sclera non-icteric Neck: Supple.  No JVD.  Pulmonary:  Good air movement, no use of accessory muscles.  Cardiac: RRR, normal S1, S2 Vascular:  Vessel Right Left  Radial Palpable Palpable  Ulnar Palpable Palpable  Brachial Palpable Palpable  Carotid Palpable, without bruit Palpable, without bruit  Aorta Not palpable N/A  Femoral Palpable Palpable  Popliteal Palpable Palpable  PT Palpable Palpable  DP Palpable Palpable   Gastrointestinal: soft, non-tender/non-distended. No guarding/reflex.  Musculoskeletal: M/S 5/5 throughout.  No deformity or atrophy. 1-2+ right lower extremity edema 1+ left lower extremity edema. Neurologic: Sensation grossly intact in extremities.  Symmetrical.  Speech is fluent.  Psychiatric: Judgment intact, Mood & affect appropriate for pt's clinical situation. Dermatologic: No ulcers noted.  No cellulitis or open wounds. A red somewhat raised rashes present more on the left posterior lower calf and lateral ankle area. Similar findings are seen on the right leg although the rash is not quite as large or red. Lymph : No Cervical, Axillary, or Inguinal lymphadenopathy.      Labs No results found for this or any previous visit (from the past 2160 hour(s)).  Radiology No results found.   Assessment/Plan  Essential hypertension, benign blood pressure control important in reducing the progression of atherosclerotic disease. On appropriate oral medications.   Diabetes (HCC) blood glucose control important in reducing the progression of atherosclerotic disease. Also, involved in wound healing. On appropriate medications.   Chronic venous insufficiency The patient has a previous history of venous ablation on the right lower extremity. She has worsening varicosities on the right leg as well as the pain and swelling in the legs. It is  possible that there are other issues such as lymphedema or autoimmune diseases, but given her previous venous history of think it is important to assess this with a venous reflux study. I discussed the pathophysiology and natural history of venous disease. I will see her back following her study.  Swelling of limb The patient has a previous history of venous ablation on the right lower extremity. She has worsening varicosities on the right leg as well as the pain and swelling in the legs. It is possible that there are other issues such as lymphedema or autoimmune diseases, but given her previous venous history of think it is important to assess this with a venous reflux study. I discussed the pathophysiology and natural history of venous disease. I will see her back following her study.  Pain in limb Likely multifactorial. Suspect there is a component of neuropathy as well as possible autoimmune/dermatologic disease that is very painful. Venous insufficiency may be contributing. See workup as  planned above.    Festus Barren, MD  10/22/2016 4:15 PM    This note was created with Dragon medical transcription system.  Any errors from dictation are purely unintentional

## 2016-10-22 NOTE — Patient Instructions (Signed)
Venous Stasis or Chronic Venous Insufficiency Chronic venous insufficiency, also called venous stasis, is a condition that affects the veins in the legs. The condition prevents blood from being pumped through these veins effectively. Blood may no longer be pumped effectively from the legs back to the heart. This condition can range from mild to severe. With proper treatment, you should be able to continue with an active life. CAUSES  Chronic venous insufficiency occurs when the vein walls become stretched, weakened, or damaged or when valves within the vein are damaged. Some common causes of this include:  High blood pressure inside the veins (venous hypertension).  Increased blood pressure in the leg veins from long periods of sitting or standing.  A blood clot that blocks blood flow in a vein (deep vein thrombosis).  Inflammation of a superficial vein (phlebitis) that causes a blood clot to form. RISK FACTORS Various things can make you more likely to develop chronic venous insufficiency, including:  Family history of this condition.  Obesity.  Pregnancy.  Sedentary lifestyle.  Smoking.  Jobs requiring long periods of standing or sitting in one place.  Being a certain age. Women in their 40s and 50s and men in their 70s are more likely to develop this condition. SIGNS AND SYMPTOMS  Symptoms may include:   Varicose veins.  Skin breakdown or ulcers.  Reddened or discolored skin on the leg.  Brown, smooth, tight, and painful skin just above the ankle, usually on the inside surface (lipodermatosclerosis).  Swelling. DIAGNOSIS  To diagnose this condition, your health care provider will take a medical history and do a physical exam. The following tests may be ordered to confirm the diagnosis:  Duplex ultrasound-A procedure that produces a picture of a blood vessel and nearby organs and also provides information on blood flow through the blood vessel.  Plethysmography-A  procedure that tests blood flow.  A venogram, or venography-A procedure used to look at the veins using X-ray and dye. TREATMENT The goals of treatment are to help you return to an active life and to minimize pain or disability. Treatment will depend on the severity of the condition. Medical procedures may be needed for severe cases. Treatment options may include:   Use of compression stockings. These can help with symptoms and lower the chances of the problem getting worse, but they do not cure the problem.  Sclerotherapy-A procedure involving an injection of a material that "dissolves" the damaged veins. Other veins in the network of blood vessels take over the function of the damaged veins.  Surgery to remove the vein or cut off blood flow through the vein (vein stripping or laser ablation surgery).  Surgery to repair a valve. HOME CARE INSTRUCTIONS   Wear compression stockings as directed by your health care provider.  Only take over-the-counter or prescription medicines for pain, discomfort, or fever as directed by your health care provider.  Follow up with your health care provider as directed. SEEK MEDICAL CARE IF:   You have redness, swelling, or increasing pain in the affected area.  You see a red streak or line that extends up or down from the affected area.  You have a breakdown or loss of skin in the affected area, even if the breakdown is small.  You have an injury to the affected area. SEEK IMMEDIATE MEDICAL CARE IF:   You have an injury and open wound in the affected area.  Your pain is severe and does not improve with medicine.  You have   sudden numbness or weakness in the foot or ankle below the affected area, or you have trouble moving your foot or ankle.  You have a fever or persistent symptoms for more than 2-3 days.  You have a fever and your symptoms suddenly get worse. MAKE SURE YOU:   Understand these instructions.  Will watch your condition.  Will  get help right away if you are not doing well or get worse. This information is not intended to replace advice given to you by your health care provider. Make sure you discuss any questions you have with your health care provider. Document Released: 01/27/2007 Document Revised: 07/14/2013 Document Reviewed: 05/31/2013 Elsevier Interactive Patient Education  2017 Elsevier Inc.  

## 2016-10-31 ENCOUNTER — Ambulatory Visit (INDEPENDENT_AMBULATORY_CARE_PROVIDER_SITE_OTHER): Payer: Medicare HMO | Admitting: Podiatry

## 2016-10-31 ENCOUNTER — Encounter: Payer: Self-pay | Admitting: Podiatry

## 2016-10-31 VITALS — BP 128/65 | HR 80 | Resp 18

## 2016-10-31 DIAGNOSIS — I872 Venous insufficiency (chronic) (peripheral): Secondary | ICD-10-CM | POA: Diagnosis not present

## 2016-10-31 DIAGNOSIS — M2042 Other hammer toe(s) (acquired), left foot: Secondary | ICD-10-CM | POA: Diagnosis not present

## 2016-10-31 DIAGNOSIS — E1149 Type 2 diabetes mellitus with other diabetic neurological complication: Secondary | ICD-10-CM | POA: Diagnosis not present

## 2016-10-31 DIAGNOSIS — M2041 Other hammer toe(s) (acquired), right foot: Secondary | ICD-10-CM | POA: Diagnosis not present

## 2016-10-31 MED ORDER — HYDROCORTISONE 2.5 % EX CREA: TOPICAL_CREAM | Freq: Two times a day (BID) | CUTANEOUS | 0 refills | Status: DC

## 2016-10-31 NOTE — Progress Notes (Signed)
   Subjective:    Patient ID: [name], female    DOB: [birth year], 72 y.o.   MRN: [id number]  HPI  71 year old female presents the office today for concerns of neuropathy, burning pain to both of her feet as well as tingling. She said that she is currently on gabapentin 300 mg 3 times a day. She still gets the symptoms mostly night. She also has stasis dermatitis and she has a rash on her leg at this time she is inquiring about possible treatment options. She also has a hammertoe which gets occasionally painful with pressure in shoes. She is requesting diabetic shoes today. She denies any recent injury or trauma. She has no other complaints today.    Review of Systems  All other systems reviewed and are negative.      Objective:   Physical Exam General: AAO x3, NAD  Dermatological: Venous stasis dermatitis rashes present bilateral legs. Skin is warm, dry and supple bilateral.  There are no open sores, no preulcerative lesions, no rash or signs of infection present.  Vascular: Dorsalis Pedis artery and Posterior Tibial artery pedal pulses are 2/4 bilateral with immedate capillary fill time. There is no pain with calf compression, swelling, warmth, erythema.   Neruologic: Sensation decreased with Dorann Ou monofilament, decreased vibratory sensation.  Musculoskeletal: Hammertoes are present. There is no area pinpoint bony tenderness or pain the vibratory sensation bilaterally. Muscular strength 5/5 in all groups tested bilateral.  Gait: Unassisted, Nonantalgic.      Assessment & Plan:  71 year old female with stasis dermatitis, hammertoe, diabetic neuropathy requesting diabetic shoes -Treatment options discussed including all alternatives, risks, and complications -Etiology of symptoms were discussed -Given her digital deformities as well as neuropathy I do believe that she would benefit from diabetic shoes. I completed paperwork today for precertification. -Offloading pads  for the hammertoes -Order compound cream through [facility] to add in conjunction with the gabapentin for neuropathy. Also discussed capsaicin cream. -Hydrocortisone cream for stasis dermatitis. If no improvement will switch Topicort   Ovid Curd, DPM

## 2016-11-13 ENCOUNTER — Ambulatory Visit (INDEPENDENT_AMBULATORY_CARE_PROVIDER_SITE_OTHER): Payer: Self-pay | Admitting: *Deleted

## 2016-11-13 DIAGNOSIS — E1149 Type 2 diabetes mellitus with other diabetic neurological complication: Secondary | ICD-10-CM

## 2016-11-13 DIAGNOSIS — M2041 Other hammer toe(s) (acquired), right foot: Secondary | ICD-10-CM

## 2016-11-13 DIAGNOSIS — I872 Venous insufficiency (chronic) (peripheral): Secondary | ICD-10-CM

## 2016-11-13 DIAGNOSIS — M2042 Other hammer toe(s) (acquired), left foot: Secondary | ICD-10-CM

## 2016-11-13 NOTE — Progress Notes (Signed)
Measured for diabetic shoes and insoles today. Will place order and notify pt on arrival. 

## 2016-12-10 ENCOUNTER — Ambulatory Visit (INDEPENDENT_AMBULATORY_CARE_PROVIDER_SITE_OTHER): Payer: Medicare HMO | Admitting: Vascular Surgery

## 2016-12-10 ENCOUNTER — Encounter (INDEPENDENT_AMBULATORY_CARE_PROVIDER_SITE_OTHER): Payer: Medicare HMO

## 2016-12-11 ENCOUNTER — Ambulatory Visit: Payer: Medicare HMO

## 2017-01-09 ENCOUNTER — Ambulatory Visit (INDEPENDENT_AMBULATORY_CARE_PROVIDER_SITE_OTHER): Payer: Medicare HMO | Admitting: Podiatry

## 2017-01-09 DIAGNOSIS — M2041 Other hammer toe(s) (acquired), right foot: Secondary | ICD-10-CM | POA: Diagnosis not present

## 2017-01-09 DIAGNOSIS — I872 Venous insufficiency (chronic) (peripheral): Secondary | ICD-10-CM

## 2017-01-09 DIAGNOSIS — M2042 Other hammer toe(s) (acquired), left foot: Secondary | ICD-10-CM

## 2017-01-09 DIAGNOSIS — E1149 Type 2 diabetes mellitus with other diabetic neurological complication: Secondary | ICD-10-CM | POA: Diagnosis not present

## 2017-01-09 NOTE — Progress Notes (Signed)
Ms [name] presents at [facility] to p/up diabetic shoes and custom inserts.   Ms. [name] donned the shoes with the inserts and found the fit to be acceptable.  There weren't any gaps, slippage, or areas of concern due to pressure.  Ms. [name] was advised to f/up if she noticed any skin irritation or other concerns.   She seemed pleased with the shoes and their fit and function.  Dr. Ardelle Anton...please drop [id number] x2 and [id number] x 6  Thanks.

## 2017-01-13 ENCOUNTER — Other Ambulatory Visit: Payer: Self-pay | Admitting: Internal Medicine

## 2017-01-13 DIAGNOSIS — Z1231 Encounter for screening mammogram for malignant neoplasm of breast: Secondary | ICD-10-CM

## 2017-01-27 ENCOUNTER — Ambulatory Visit (INDEPENDENT_AMBULATORY_CARE_PROVIDER_SITE_OTHER): Payer: Medicare HMO | Admitting: Podiatry

## 2017-01-27 DIAGNOSIS — M79676 Pain in unspecified toe(s): Secondary | ICD-10-CM

## 2017-01-27 DIAGNOSIS — E1149 Type 2 diabetes mellitus with other diabetic neurological complication: Secondary | ICD-10-CM

## 2017-01-27 DIAGNOSIS — B351 Tinea unguium: Secondary | ICD-10-CM

## 2017-01-27 NOTE — Progress Notes (Signed)
Complaint:  Visit Type: Patient returns to my office for continued preventative foot care services. Complaint: Patient states" my nails have grown long and thick and become painful to walk and wear shoes" Patient has been diagnosed with DM with neuropathy. The patient presents for preventative foot care services.   Podiatric Exam: Vascular: dorsalis pedis and posterior tibial pulses are palpable bilateral. Capillary return is immediate. Temperature gradient is WNL. Skin turgor WNL  Sensorium: Diminished  Semmes Weinstein monofilament test. Normal tactile sensation bilaterally. Nail Exam: Pt has thick disfigured discolored nails with subungual debris noted bilateral entire nail hallux through fifth toenails Ulcer Exam: There is no evidence of ulcer or pre-ulcerative changes or infection. Orthopedic Exam: Muscle tone and strength are WNL. No limitations in general ROM. No crepitus or effusions noted. Hammer toes  B/L. Skin: No Porokeratosis. No infection or ulcers  Diagnosis:  Onychomycosis, , Pain in right toe, pain in left toes  Treatment & Plan Procedures and Treatment: Consent by patient was obtained for treatment procedures. The patient understood the discussion of treatment and procedures well. All questions were answered thoroughly reviewed. Debridement of mycotic and hypertrophic toenails, 1 through 5 bilateral and clearing of subungual debris. No ulceration, no infection noted.  Return Visit-Office Procedure: Patient instructed to return to the office for a follow up visit 3 months for continued evaluation and treatment.    Helane Gunther DPM

## 2017-02-03 ENCOUNTER — Encounter (INDEPENDENT_AMBULATORY_CARE_PROVIDER_SITE_OTHER): Payer: Medicare HMO

## 2017-02-03 ENCOUNTER — Ambulatory Visit (INDEPENDENT_AMBULATORY_CARE_PROVIDER_SITE_OTHER): Payer: Medicare HMO | Admitting: Vascular Surgery

## 2017-02-04 ENCOUNTER — Ambulatory Visit
Admission: RE | Admit: 2017-02-04 | Discharge: 2017-02-04 | Disposition: A | Discharge status: Home / Self Care | Payer: Medicare HMO | Source: Ambulatory Visit | Attending: Internal Medicine | Admitting: Internal Medicine

## 2017-02-04 DIAGNOSIS — Z1231 Encounter for screening mammogram for malignant neoplasm of breast: Secondary | ICD-10-CM | POA: Insufficient documentation

## 2017-10-21 ENCOUNTER — Encounter (INDEPENDENT_AMBULATORY_CARE_PROVIDER_SITE_OTHER): Payer: Self-pay

## 2017-10-21 ENCOUNTER — Encounter: Payer: Self-pay | Admitting: Gastroenterology

## 2017-10-21 ENCOUNTER — Ambulatory Visit: Payer: Medicare HMO | Admitting: Gastroenterology

## 2017-10-21 VITALS — BP 137/65 | HR 73 | Ht 70.0 in | Wt 194.2 lb

## 2017-10-21 DIAGNOSIS — R748 Abnormal levels of other serum enzymes: Secondary | ICD-10-CM | POA: Diagnosis not present

## 2017-10-21 NOTE — Progress Notes (Signed)
Gastroenterology Consultation  Referring Provider:     Sherrie Mustache, MD Primary Care Physician:  Sherrie Mustache, MD Primary Gastroenterologist:  Dr. Servando Snare     Reason for Consultation:     Abnormal liver enzymes        HPI:   [name] is a 72 y.o. y/o female referred for consultation & management of Abnormal liver enzymes by Dr. Sherrie Mustache, MD.  This patient is here with a history of fatty liver and a nodular liver consistent with possible cirrhosis.  The patient denies a history of having abnormal liver enzymes.  She does report that her cholesterol has been high and her diabetes has not been well controlled.  She denies any abdominal pain nausea vomiting fevers or chills.  The patient also denies any high risk activity for abnormal liver enzymes.  She also denies ever being jaundiced. The patient has not had a workup for abnormal liver enzymes in the past. It appears that her last colonoscopy was approximately 11 years ago.  Past Medical History:  Diagnosis Date  . Diabetes mellitus without complication (HCC)   . Hyperlipidemia   . Hypertension     History reviewed. No pertinent surgical history.  Prior to Admission medications   Medication Sig Start Date End Date Taking? Authorizing Provider  amLODipine-benazepril (LOTREL) 10-20 MG capsule Take by mouth.   Yes [provider]  aspirin EC 81 MG tablet Take 81 mg by mouth daily.   Yes [provider]  benazepril (LOTENSIN) 20 MG tablet Take 20 mg by mouth daily. 07/29/16  Yes [provider]  gabapentin (NEURONTIN) 300 MG capsule Take 300 mg by mouth.   Yes [provider]  glipiZIDE (GLUCOTROL XL) 5 MG 24 hr tablet Take 5 mg by mouth 2 (two) times daily. 10/15/16  Yes [provider]  hydrochlorothiazide (HYDRODIURIL) 25 MG tablet Take 25 mg by mouth 2 (two) times daily. 10/15/16  Yes [provider]  hydrocortisone 2.5 % cream Apply topically 2 (two) times daily. 10/31/16   Yes Vivi Barrack, DPM  KOMBIGLYZE XR 2.02-999 MG TB24 TAKE 1 TABLET 2 TIMES A DAY ORALLY 10/16/16  Yes [provider]  metoprolol succinate (TOPROL-XL) 50 MG 24 hr tablet TAKE 1 TABLET ONCE A DAY ORALLY 10/01/16  Yes [provider]  Vitamin D, Ergocalciferol, (DRISDOL) 50000 units CAPS capsule Take 50,000 Units by mouth once a week. 10/16/16  Yes [provider]  pentoxifylline (TRENTAL) 400 MG CR tablet Take 400 mg by mouth.    [provider]    Family History  Problem Relation Age of Onset  . Breast cancer Sister 6  . Breast cancer Maternal Aunt      Social History   Tobacco Use  . Smoking status: Former Games developer  . Smokeless tobacco: Never Used  Substance Use Topics  . Alcohol use: No  . Drug use: No    Allergies as of 10/21/2017  . (No Known Allergies)    Review of Systems:    All systems reviewed and negative except where noted in HPI.   Physical Exam:  BP 137/65   Pulse 73   Ht 5\' 10"  (1.778 m)   Wt 194 lb 3.2 oz (88.1 kg)   BMI 27.86 kg/m  No LMP recorded. Patient is postmenopausal. Psych:  Alert and cooperative. Normal mood and affect. General:   Alert,  Well-developed, well-nourished, pleasant and cooperative in NAD Head:  Normocephalic and atraumatic. Eyes:  Sclera clear,  no icterus.   Conjunctiva pink. Ears:  Normal auditory acuity. Nose:  No deformity, discharge, or lesions. Mouth:  No deformity or lesions,oropharynx pink & moist. Neck:  Supple; no masses or thyromegaly. Lungs:  Respirations even and unlabored.  Clear throughout to auscultation.   No wheezes, crackles, or rhonchi. No acute distress. Heart:  Regular rate and rhythm; no murmurs, clicks, rubs, or gallops. Abdomen:  Normal bowel sounds.  No bruits.  Soft, non-tender and non-distended without masses, hepatosplenomegaly or hernias noted.  No guarding or rebound tenderness.  Negative Carnett sign.   Rectal:  Deferred.  Msk:  Symmetrical without gross  deformities.  Good, equal movement & strength bilaterally. Pulses:  Normal pulses noted. Extremities:  No clubbing or edema.  No cyanosis. Neurologic:  Alert and oriented x3;  grossly normal neurologically. Skin:  Intact without significant lesions or rashes.  No jaundice. Lymph Nodes:  No significant cervical adenopathy. Psych:  Alert and cooperative. Normal mood and affect.  Imaging Studies: No results found.  Assessment and Plan:   MERCEDIES [name] is a 72 y.o. y/o female with a finding of abnormal liver enzymes. The patient's AST was 43 with her ALT being 68. The patient triglycerides are also high.  The patient will have her labs sent off for other possible causes of abnormal liver enzymes including iron studies repeat liver function tests ferritin a and a acute hepatitis panel ceruloplasmin Alpha I antitrypsin a's and alpha-fetoprotein.  She will also be set up for a right upper quadrant ultrasound.  The patient will be informed of the results of her labs.  The patient has been explained the plan and agrees with it.  Midge Minium, MD. Clementeen Graham   Note: This dictation was prepared with Dragon dictation along with smaller phrase technology. Any transcriptional errors that result from this process are unintentional.

## 2017-10-21 NOTE — Patient Instructions (Signed)
You are scheduled for a RUQ abdominal US at [facility] on Friday, January 18th @ 9:00am. Please arrive at the medical mall registration desk at 8:45am. You cannot have anything to eat or drink 6 hours prior.   If you need to reschedule this appointment for any reason, please contact central scheduling at [phone].

## 2017-10-24 ENCOUNTER — Ambulatory Visit: Payer: Medicare HMO

## 2017-10-28 ENCOUNTER — Other Ambulatory Visit
Admission: RE | Admit: 2017-10-28 | Discharge: 2017-10-28 | Disposition: A | Discharge status: Home / Self Care | Payer: Medicare HMO | Source: Ambulatory Visit | Attending: Gastroenterology | Admitting: Gastroenterology

## 2017-10-28 ENCOUNTER — Ambulatory Visit
Admission: RE | Admit: 2017-10-28 | Discharge: 2017-10-28 | Disposition: A | Discharge status: Home / Self Care | Payer: Medicare HMO | Source: Ambulatory Visit | Attending: Gastroenterology | Admitting: Gastroenterology

## 2017-10-28 DIAGNOSIS — R748 Abnormal levels of other serum enzymes: Secondary | ICD-10-CM

## 2017-10-28 LAB — HEPATIC FUNCTION PANEL
ALBUMIN: 4.4 g/dL (ref 3.5–5.0)
ALK PHOS: 49 U/L (ref 38–126)
ALT: 56 U/L — AB (ref 14–54)
AST: 36 U/L (ref 15–41)
BILIRUBIN TOTAL: 0.7 mg/dL (ref 0.3–1.2)
Bilirubin, Direct: <0.1 mg/dL — ABNORMAL LOW (ref 0.1–0.5)
TOTAL PROTEIN: 7.4 g/dL (ref 6.5–8.1)

## 2017-10-28 LAB — IRON AND TIBC
Iron: 87 ug/dL (ref 28–170)
Saturation Ratios: 25 % (ref 10.4–31.8)
TIBC: 352 ug/dL (ref 250–450)
UIBC: 265 ug/dL

## 2017-10-28 LAB — FERRITIN: FERRITIN: 103 ng/mL (ref 11–307)

## 2017-10-29 LAB — AFP TUMOR MARKER: AFP, Serum, Tumor Marker: 4.7 ng/mL (ref 0.0–8.3)

## 2017-10-29 LAB — ANTI-SMOOTH MUSCLE ANTIBODY, IGG: F-Actin IgG: 10 Units (ref 0–19)

## 2017-10-29 LAB — HEPATITIS A ANTIBODY, TOTAL: Hep A Total Ab: NEGATIVE

## 2017-10-29 LAB — ALPHA-1-ANTITRYPSIN: A-1 Antitrypsin, Ser: 117 mg/dL (ref 90–200)

## 2017-10-29 LAB — MITOCHONDRIAL ANTIBODIES

## 2017-10-29 LAB — HEPATITIS B SURFACE ANTIBODY,QUALITATIVE: Hep B S Ab: NONREACTIVE

## 2017-10-29 LAB — CERULOPLASMIN: CERULOPLASMIN: 23.8 mg/dL (ref 19.0–39.0)

## 2017-10-29 LAB — HEPATITIS B SURFACE ANTIGEN: Hepatitis B Surface Ag: NEGATIVE

## 2017-10-31 ENCOUNTER — Telehealth: Payer: Self-pay

## 2017-10-31 NOTE — Telephone Encounter (Signed)
-----   Message from Midge Minium, MD sent at 10/28/2017 12:44 PM EST ----- Let the patient know that her ultrasound showed fatty liver and her liver enzymes are coming down. The patient should have her liver enzymes checked again in 3 months.

## 2017-10-31 NOTE — Telephone Encounter (Signed)
Pt notified of labs and US results.  

## 2017-10-31 NOTE — Telephone Encounter (Signed)
-----   Message from Marcia Minium, MD sent at 10/30/2017  6:09 PM EST ----- Let the patient know that the liver enzymes are better and that she needs a vaccination for hepatitis A and hepatitis B because she is not immune.  The patient should have her liver enzymes checked again in 3 months.

## 2018-02-13 ENCOUNTER — Other Ambulatory Visit: Payer: Self-pay | Admitting: Internal Medicine

## 2018-02-13 DIAGNOSIS — Z1231 Encounter for screening mammogram for malignant neoplasm of breast: Secondary | ICD-10-CM

## 2018-02-20 ENCOUNTER — Telehealth: Payer: Self-pay

## 2018-02-20 NOTE — Telephone Encounter (Signed)
Left vm letting pt know she is due for her repeat hepatic function.

## 2018-02-20 NOTE — Telephone Encounter (Signed)
-----   Message from Rayann Heman, CMA sent at 10/31/2017  3:53 PM EST ----- Pt needs repeat LFT's.

## 2018-03-04 ENCOUNTER — Ambulatory Visit
Admission: RE | Admit: 2018-03-04 | Discharge: 2018-03-04 | Disposition: A | Discharge status: Home / Self Care | Payer: Medicare HMO | Source: Ambulatory Visit | Attending: Internal Medicine | Admitting: Internal Medicine

## 2018-03-04 DIAGNOSIS — Z1231 Encounter for screening mammogram for malignant neoplasm of breast: Secondary | ICD-10-CM | POA: Insufficient documentation

## 2018-05-05 ENCOUNTER — Encounter: Payer: Self-pay | Admitting: Emergency Medicine

## 2018-05-05 ENCOUNTER — Inpatient Hospital Stay
Admission: EM | Admit: 2018-05-05 | Discharge: 2018-05-08 | DRG: 602 | Disposition: A | Discharge status: Home Health | Payer: Medicare HMO | Attending: Internal Medicine | Admitting: Internal Medicine

## 2018-05-05 ENCOUNTER — Other Ambulatory Visit: Payer: Self-pay

## 2018-05-05 DIAGNOSIS — Z7982 Long term (current) use of aspirin: Secondary | ICD-10-CM | POA: Diagnosis not present

## 2018-05-05 DIAGNOSIS — E785 Hyperlipidemia, unspecified: Secondary | ICD-10-CM | POA: Diagnosis present

## 2018-05-05 DIAGNOSIS — Z87891 Personal history of nicotine dependence: Secondary | ICD-10-CM

## 2018-05-05 DIAGNOSIS — I872 Venous insufficiency (chronic) (peripheral): Secondary | ICD-10-CM | POA: Diagnosis not present

## 2018-05-05 DIAGNOSIS — Z79899 Other long term (current) drug therapy: Secondary | ICD-10-CM | POA: Diagnosis not present

## 2018-05-05 DIAGNOSIS — L03115 Cellulitis of right lower limb: Secondary | ICD-10-CM | POA: Diagnosis present

## 2018-05-05 DIAGNOSIS — I10 Essential (primary) hypertension: Secondary | ICD-10-CM | POA: Diagnosis present

## 2018-05-05 DIAGNOSIS — L309 Dermatitis, unspecified: Secondary | ICD-10-CM | POA: Diagnosis present

## 2018-05-05 DIAGNOSIS — L0390 Cellulitis, unspecified: Secondary | ICD-10-CM

## 2018-05-05 DIAGNOSIS — Z7984 Long term (current) use of oral hypoglycemic drugs: Secondary | ICD-10-CM

## 2018-05-05 DIAGNOSIS — E1140 Type 2 diabetes mellitus with diabetic neuropathy, unspecified: Secondary | ICD-10-CM | POA: Diagnosis present

## 2018-05-05 DIAGNOSIS — I878 Other specified disorders of veins: Secondary | ICD-10-CM | POA: Diagnosis present

## 2018-05-05 DIAGNOSIS — L89513 Pressure ulcer of right ankle, stage 3: Secondary | ICD-10-CM | POA: Diagnosis present

## 2018-05-05 DIAGNOSIS — L97909 Non-pressure chronic ulcer of unspecified part of unspecified lower leg with unspecified severity: Secondary | ICD-10-CM

## 2018-05-05 DIAGNOSIS — M869 Osteomyelitis, unspecified: Secondary | ICD-10-CM

## 2018-05-05 DIAGNOSIS — M7989 Other specified soft tissue disorders: Secondary | ICD-10-CM | POA: Diagnosis present

## 2018-05-05 LAB — URINALYSIS, COMPLETE (UACMP) WITH MICROSCOPIC
BILIRUBIN URINE: NEGATIVE
Bacteria, UA: NONE SEEN
Glucose, UA: NEGATIVE mg/dL
Hgb urine dipstick: NEGATIVE
KETONES UR: NEGATIVE mg/dL
LEUKOCYTES UA: NEGATIVE
NITRITE: NEGATIVE
PH: 5 (ref 5.0–8.0)
Protein, ur: NEGATIVE mg/dL
SPECIFIC GRAVITY, URINE: 1.009 (ref 1.005–1.030)

## 2018-05-05 LAB — CBC
HEMATOCRIT: 38.1 % (ref 35.0–47.0)
Hemoglobin: 13.2 g/dL (ref 12.0–16.0)
MCH: 30.2 pg (ref 26.0–34.0)
MCHC: 34.8 g/dL (ref 32.0–36.0)
MCV: 86.8 fL (ref 80.0–100.0)
PLATELETS: 299 10*3/uL (ref 150–440)
RBC: 4.39 MIL/uL (ref 3.80–5.20)
RDW: 13 % (ref 11.5–14.5)
WBC: 9.1 10*3/uL (ref 3.6–11.0)

## 2018-05-05 LAB — BASIC METABOLIC PANEL
Anion gap: 11 (ref 5–15)
BUN: 18 mg/dL (ref 8–23)
CHLORIDE: 101 mmol/L (ref 98–111)
CO2: 26 mmol/L (ref 22–32)
CREATININE: 0.98 mg/dL (ref 0.44–1.00)
Calcium: 9.7 mg/dL (ref 8.9–10.3)
GFR, EST NON AFRICAN AMERICAN: 55 mL/min — AB (ref >60)
Glucose, Bld: 144 mg/dL — ABNORMAL HIGH (ref 70–99)
POTASSIUM: 3.7 mmol/L (ref 3.5–5.1)
SODIUM: 138 mmol/L (ref 135–145)

## 2018-05-05 LAB — HEMOGLOBIN A1C
HEMOGLOBIN A1C: 6.9 % — AB (ref 4.8–5.6)
Mean Plasma Glucose: 151.33 mg/dL

## 2018-05-05 LAB — GLUCOSE, CAPILLARY
GLUCOSE-CAPILLARY: 138 mg/dL — AB (ref 70–99)
Glucose-Capillary: 130 mg/dL — ABNORMAL HIGH (ref 70–99)
Glucose-Capillary: 140 mg/dL — ABNORMAL HIGH (ref 70–99)

## 2018-05-05 MED ORDER — AMLODIPINE BESYLATE 10 MG PO TABS
10.0000 mg | ORAL_TABLET | Freq: Every day | ORAL | Status: DC
  Administered 2018-05-06 – 2018-05-08 (×3): 10 mg via ORAL
  Filled 2018-05-05 (×3): qty 1

## 2018-05-05 MED ORDER — INSULIN ASPART 100 UNIT/ML ~~LOC~~ SOLN: 0.0000 [IU] | Freq: Every day | SUBCUTANEOUS | Status: DC

## 2018-05-05 MED ORDER — SODIUM CHLORIDE 0.9% FLUSH
3.0000 mL | INTRAVENOUS | Status: DC | PRN
  Administered 2018-05-05: 3 mL via INTRAVENOUS
  Filled 2018-05-05: qty 3

## 2018-05-05 MED ORDER — ONDANSETRON HCL 4 MG/2ML IJ SOLN
4.0000 mg | Freq: Once | INTRAMUSCULAR | Status: AC
  Administered 2018-05-05: 4 mg via INTRAVENOUS
  Filled 2018-05-05: qty 2

## 2018-05-05 MED ORDER — VANCOMYCIN HCL 10 G IV SOLR
1250.0000 mg | INTRAVENOUS | Status: DC
  Administered 2018-05-05 – 2018-05-07 (×4): 1250 mg via INTRAVENOUS
  Filled 2018-05-05 (×5): qty 1250

## 2018-05-05 MED ORDER — BISACODYL 5 MG PO TBEC: 5.0000 mg | DELAYED_RELEASE_TABLET | Freq: Every day | ORAL | Status: DC | PRN

## 2018-05-05 MED ORDER — FLEET ENEMA 7-19 GM/118ML RE ENEM: 1.0000 | ENEMA | Freq: Once | RECTAL | Status: DC | PRN

## 2018-05-05 MED ORDER — METOPROLOL SUCCINATE ER 50 MG PO TB24
50.0000 mg | ORAL_TABLET | Freq: Every day | ORAL | Status: DC
  Administered 2018-05-06 – 2018-05-08 (×3): 50 mg via ORAL
  Filled 2018-05-05 (×3): qty 1

## 2018-05-05 MED ORDER — SODIUM CHLORIDE 0.9 % IV SOLN: 250.0000 mL | INTRAVENOUS | Status: DC | PRN

## 2018-05-05 MED ORDER — ACETAMINOPHEN 650 MG RE SUPP: 650.0000 mg | Freq: Four times a day (QID) | RECTAL | Status: DC | PRN

## 2018-05-05 MED ORDER — ENOXAPARIN SODIUM 40 MG/0.4ML ~~LOC~~ SOLN
40.0000 mg | SUBCUTANEOUS | Status: DC
  Administered 2018-05-05 – 2018-05-07 (×3): 40 mg via SUBCUTANEOUS
  Filled 2018-05-05 (×3): qty 0.4

## 2018-05-05 MED ORDER — CELECOXIB 200 MG PO CAPS
200.0000 mg | ORAL_CAPSULE | Freq: Every day | ORAL | Status: DC
  Administered 2018-05-06 – 2018-05-08 (×3): 200 mg via ORAL
  Filled 2018-05-05 (×3): qty 1

## 2018-05-05 MED ORDER — ALBUTEROL SULFATE (2.5 MG/3ML) 0.083% IN NEBU: 2.5000 mg | INHALATION_SOLUTION | RESPIRATORY_TRACT | Status: DC | PRN

## 2018-05-05 MED ORDER — VANCOMYCIN HCL IN DEXTROSE 1-5 GM/200ML-% IV SOLN
1000.0000 mg | Freq: Once | INTRAVENOUS | Status: AC
  Administered 2018-05-05: 1000 mg via INTRAVENOUS
  Filled 2018-05-05: qty 200

## 2018-05-05 MED ORDER — VITAMIN D 1000 UNITS PO TABS
5000.0000 [IU] | ORAL_TABLET | Freq: Every day | ORAL | Status: DC
  Administered 2018-05-06 – 2018-05-08 (×3): 5000 [IU] via ORAL
  Filled 2018-05-05 (×3): qty 5

## 2018-05-05 MED ORDER — SODIUM CHLORIDE 0.9% FLUSH
3.0000 mL | Freq: Two times a day (BID) | INTRAVENOUS | Status: DC
  Administered 2018-05-05 – 2018-05-08 (×6): 3 mL via INTRAVENOUS

## 2018-05-05 MED ORDER — BENAZEPRIL HCL 20 MG PO TABS
20.0000 mg | ORAL_TABLET | Freq: Every day | ORAL | Status: DC
  Administered 2018-05-06 – 2018-05-08 (×3): 20 mg via ORAL
  Filled 2018-05-05 (×3): qty 1

## 2018-05-05 MED ORDER — KETOROLAC TROMETHAMINE 15 MG/ML IJ SOLN: 15.0000 mg | Freq: Four times a day (QID) | INTRAMUSCULAR | Status: DC | PRN

## 2018-05-05 MED ORDER — ASPIRIN EC 81 MG PO TBEC
81.0000 mg | DELAYED_RELEASE_TABLET | Freq: Every day | ORAL | Status: DC
  Administered 2018-05-06 – 2018-05-08 (×3): 81 mg via ORAL
  Filled 2018-05-05 (×3): qty 1

## 2018-05-05 MED ORDER — HYDROCODONE-ACETAMINOPHEN 5-325 MG PO TABS
1.0000 | ORAL_TABLET | ORAL | Status: DC | PRN
  Administered 2018-05-05: 2 via ORAL
  Administered 2018-05-05: 1 via ORAL
  Administered 2018-05-06 – 2018-05-08 (×7): 2 via ORAL
  Filled 2018-05-05 (×4): qty 2
  Filled 2018-05-05: qty 1
  Filled 2018-05-05 (×4): qty 2

## 2018-05-05 MED ORDER — COLLAGENASE 250 UNIT/GM EX OINT
TOPICAL_OINTMENT | Freq: Every day | CUTANEOUS | Status: DC
  Administered 2018-05-05 – 2018-05-08 (×4): via TOPICAL
  Filled 2018-05-05: qty 30

## 2018-05-05 MED ORDER — PIPERACILLIN-TAZOBACTAM 3.375 G IVPB
3.3750 g | Freq: Three times a day (TID) | INTRAVENOUS | Status: DC
  Administered 2018-05-05 – 2018-05-07 (×6): 3.375 g via INTRAVENOUS
  Filled 2018-05-05 (×6): qty 50

## 2018-05-05 MED ORDER — INSULIN ASPART 100 UNIT/ML ~~LOC~~ SOLN
0.0000 [IU] | Freq: Three times a day (TID) | SUBCUTANEOUS | Status: DC
  Administered 2018-05-06: 3 [IU] via SUBCUTANEOUS
  Administered 2018-05-06: 1 [IU] via SUBCUTANEOUS
  Administered 2018-05-06: 2 [IU] via SUBCUTANEOUS
  Administered 2018-05-07: 1 [IU] via SUBCUTANEOUS
  Administered 2018-05-07: 2 [IU] via SUBCUTANEOUS
  Administered 2018-05-07: 1 [IU] via SUBCUTANEOUS
  Administered 2018-05-08: 2 [IU] via SUBCUTANEOUS
  Filled 2018-05-05 (×5): qty 1

## 2018-05-05 MED ORDER — ACETAMINOPHEN 325 MG PO TABS: 650.0000 mg | ORAL_TABLET | Freq: Four times a day (QID) | ORAL | Status: DC | PRN

## 2018-05-05 MED ORDER — HYDROCHLOROTHIAZIDE 25 MG PO TABS
25.0000 mg | ORAL_TABLET | Freq: Two times a day (BID) | ORAL | Status: DC
  Administered 2018-05-06 – 2018-05-08 (×5): 25 mg via ORAL
  Filled 2018-05-05 (×5): qty 1

## 2018-05-05 MED ORDER — ONDANSETRON HCL 4 MG/2ML IJ SOLN: 4.0000 mg | Freq: Four times a day (QID) | INTRAMUSCULAR | Status: DC | PRN

## 2018-05-05 MED ORDER — ONDANSETRON HCL 4 MG PO TABS: 4.0000 mg | ORAL_TABLET | Freq: Four times a day (QID) | ORAL | Status: DC | PRN

## 2018-05-05 MED ORDER — SENNOSIDES-DOCUSATE SODIUM 8.6-50 MG PO TABS: 1.0000 | ORAL_TABLET | Freq: Every evening | ORAL | Status: DC | PRN

## 2018-05-05 MED ORDER — GABAPENTIN 300 MG PO CAPS
300.0000 mg | ORAL_CAPSULE | Freq: Three times a day (TID) | ORAL | Status: DC
  Administered 2018-05-05 – 2018-05-08 (×9): 300 mg via ORAL
  Filled 2018-05-05 (×9): qty 1

## 2018-05-05 MED ORDER — ROSUVASTATIN CALCIUM 10 MG PO TABS
10.0000 mg | ORAL_TABLET | Freq: Every day | ORAL | Status: DC
  Administered 2018-05-05 – 2018-05-07 (×3): 10 mg via ORAL
  Filled 2018-05-05 (×3): qty 1

## 2018-05-05 MED ORDER — MORPHINE SULFATE (PF) 4 MG/ML IV SOLN
4.0000 mg | Freq: Once | INTRAVENOUS | Status: AC
  Administered 2018-05-05: 4 mg via INTRAVENOUS
  Filled 2018-05-05: qty 1

## 2018-05-05 MED ORDER — PIPERACILLIN-TAZOBACTAM 3.375 G IVPB 30 MIN
3.3750 g | Freq: Once | INTRAVENOUS | Status: AC
  Administered 2018-05-05: 3.375 g via INTRAVENOUS
  Filled 2018-05-05: qty 50

## 2018-05-05 NOTE — Plan of Care (Signed)

## 2018-05-05 NOTE — ED Notes (Signed)
Inpt MD at bedside

## 2018-05-05 NOTE — H&P (Signed)
[facility] [place] [facility]   PATIENT NAME: [name]    MR#:  [id number]  DATE OF BIRTH:  01-Jan-1946  DATE OF ADMISSION:  05/05/2018  PRIMARY CARE PHYSICIAN: Sherrie Mustache, MD (Inactive)   REQUESTING/REFERRING PHYSICIAN: Dr. Pershing Proud.  CHIEF COMPLAINT:   Chief Complaint  Patient presents with  . Wound Infection    Right ankle and leg swelling, redness and tenderness for 2-3 weeks. HISTORY OF PRESENT ILLNESS:  [name]  is a 72 y.o. female with a known history of hypertension, diabetes and hyperlipidemia. history of dermatitis to the right lower extremity. Her right leg has been swelling, pain and ride for 2 to 3 weeks.  She was given Keflex by PCP without improvement.  Her symptoms has been worsening.  He also complains of right heel pain on walking. PAST MEDICAL HISTORY:   Past Medical History:  Diagnosis Date  . Diabetes mellitus without complication (HCC)   . Hyperlipidemia   . Hypertension     PAST SURGICAL HISTORY:  History reviewed. No pertinent surgical history.  SOCIAL HISTORY:   Social History   Tobacco Use  . Smoking status: Former Games developer  . Smokeless tobacco: Never Used  Substance Use Topics  . Alcohol use: No    FAMILY HISTORY:   Family History  Problem Relation Age of Onset  . Breast cancer Sister 34  . Breast cancer Maternal Aunt     DRUG ALLERGIES:  No Known Allergies  REVIEW OF SYSTEMS:   Review of Systems  Constitutional: Negative for chills, fever and malaise/fatigue.  HENT: Negative for sore throat.   Eyes: Negative for blurred vision and double vision.  Respiratory: Negative for cough, hemoptysis, shortness of breath, wheezing and stridor.   Cardiovascular: Negative for chest pain, palpitations, orthopnea and leg swelling.  Gastrointestinal: Negative for abdominal pain, blood in stool, diarrhea, melena, nausea and vomiting.  Genitourinary: Negative for dysuria, flank pain and hematuria.    Musculoskeletal: Negative for back pain and joint pain.        Right ankle and the leg swelling, erythema and tenderness. Right heel pain.  Skin: Negative for rash.  Neurological: Negative for dizziness, sensory change, focal weakness, seizures, loss of consciousness, weakness and headaches.  Endo/Heme/Allergies: Negative for polydipsia.  Psychiatric/Behavioral: Negative for depression. The patient is not nervous/anxious.     MEDICATIONS AT HOME:   Prior to Admission medications   Medication Sig Start Date End Date Taking? Authorizing Provider  amLODipine (NORVASC) 10 MG tablet Take 10 mg by mouth daily.   Yes [provider]  aspirin EC 81 MG tablet Take 81 mg by mouth daily.   Yes [provider]  benazepril (LOTENSIN) 20 MG tablet Take 20 mg by mouth daily. 07/29/16  Yes [provider]  celecoxib (CELEBREX) 200 MG capsule Take 200 mg by mouth daily.   Yes [provider]  Cholecalciferol (VITAMIN D-3) 5000 units TABS Take 5,000 Units by mouth daily.   Yes [provider]  gabapentin (NEURONTIN) 300 MG capsule Take 300 mg by mouth 3 (three) times daily.    Yes [provider]  glipiZIDE (GLUCOTROL XL) 5 MG 24 hr tablet Take 5 mg by mouth 2 (two) times daily. 10/15/16  Yes [provider]  hydrochlorothiazide (HYDRODIURIL) 25 MG tablet Take 25 mg by mouth 2 (two) times daily. 10/15/16  Yes [provider]  hydrocortisone 2.5 % cream Apply topically 2 (two) times daily. 10/31/16  Yes Vivi Barrack, DPM  KOMBIGLYZE XR 2.02-999 MG TB24 TAKE 1 TABLET 2 TIMES A DAY ORALLY 10/16/16  Yes [provider]  metoprolol succinate (TOPROL-XL) 50 MG 24 hr tablet TAKE 1 TABLET ONCE A DAY ORALLY 10/01/16  Yes [provider]  rosuvastatin (CRESTOR) 10 MG tablet Take 10 mg by mouth daily.   Yes [provider]      VITAL SIGNS:  Blood pressure (!) 125/58, pulse 65, temperature 97.8 F (36.6 C),  temperature source Oral, resp. rate 18, height 5\' 10"  (1.778 m), weight 190 lb (86.2 kg), SpO2 95 %.  PHYSICAL EXAMINATION:  Physical Exam  GENERAL:  72 y.o.-year-old patient lying in the bed with no acute distress.  EYES: Pupils equal, round, reactive to light and accommodation. No scleral icterus. Extraocular muscles intact.  HEENT: Head atraumatic, normocephalic. Oropharynx and nasopharynx clear.  NECK:  Supple, no jugular venous distention. No thyroid enlargement, no tenderness.  LUNGS: Normal breath sounds bilaterally, no wheezing, rales,rhonchi or crepitation. No use of accessory muscles of respiration.  CARDIOVASCULAR: S1, S2 normal. No murmurs, rubs, or gallops.  ABDOMEN: Soft, nontender, nondistended. Bowel sounds present. No organomegaly or mass.  EXTREMITIES: Right ankle and the leg swelling, erythema and tenderness.  Ulcer on right ankle with surrounding erythema, no cyanosis, or clubbing.  NEUROLOGIC: Cranial nerves II through XII are intact. Muscle strength 5/5 in all extremities. Sensation intact. Gait not checked.  PSYCHIATRIC: The patient is alert and oriented x 3.  SKIN: No obvious rash, lesion, or ulcer.   LABORATORY PANEL:   CBC Recent Labs  Lab 05/05/18 0954  WBC 9.1  HGB 13.2  HCT 38.1  PLT 299   ------------------------------------------------------------------------------------------------------------------  Chemistries  Recent Labs  Lab 05/05/18 0954  NA 138  K 3.7  CL 101  CO2 26  GLUCOSE 144*  BUN 18  CREATININE 0.98  CALCIUM 9.7   ------------------------------------------------------------------------------------------------------------------  Cardiac Enzymes No results for input(s): TROPONINI in the last 168 hours. ------------------------------------------------------------------------------------------------------------------  RADIOLOGY:  No results found.    IMPRESSION AND PLAN:   Right leg and ankle wound infection and  cellulitis. The patient will be admitted to medical floor. Continue vancomycin and Zosyn.  Hypertension.  Continue home hypertension medication.  Diabetes.  Hold home p.o. medication, start sliding scale, check hemoglobin A1c.  All the records are reviewed and case discussed with ED provider. Management plans discussed with the patient, her husband and they are in agreement.  CODE STATUS: Full code  TOTAL TIME TAKING CARE OF THIS PATIENT: 42 minutes.    Shaune Pollack M.D on 05/05/2018 at 11:33 AM  Between 7am to 6pm - Pager - [phone]  After 6pm go to [email or web address] - Social research officer, government  [facility] [place] [facility]  Office  [phone]  CC: Primary care physician; Sherrie Mustache, MD (Inactive)   Note: This dictation was prepared with Dragon dictation along with smaller phrase technology. Any transcriptional errors that result from this process are unin

## 2018-05-05 NOTE — Consult Note (Signed)
WOC Nurse wound consult note Reason for Consult: Consult requested for RLE.  Pt states the wound to her posterior calf has been present for several months.  She recently had increased edema and erythremia to right leg and foot and a full thickness wound developed. X-ray results are pending since she c/o pain to her right heel; there is no wound at this location. Wound type: Full thickness wound to inner ankle; 7X7X.2cm irregular shaped, 80% yellow, 20% red, small amt yellow drainage, no odor or fluctuance.  Right posterior calf with full thickness wound; 1X1X.1cm, 50% red, 50% yellow, small amt yellow drainage, no odor or fluctuance. Generalized edema and erythema surrounding. Dressing procedure/placement/frequency: Santyl ointment to provide enzymatic debridement of nonviable tissue.  Float heel to reduce pressure. Discussed plan of care with patient and she verbalized understanding. Please re-consult if further assistance is needed.  Thank-you,  Cammie Mcgee MSN, RN, CWOCN, [place], CNS [phone]

## 2018-05-05 NOTE — ED Notes (Signed)
Pt taken to inpt room with [facility] ED tech. ABCs intact. NAD.

## 2018-05-05 NOTE — Progress Notes (Signed)
Pt admitted to room. Oriented to safety precautions , bed in low position, call light in reach. Family at bedside.

## 2018-05-05 NOTE — ED Provider Notes (Signed)
[facility] Emergency Department Provider Note ____________________________________________   First MD Initiated Contact with Patient 05/05/18 1110     (approximate)  I have reviewed the triage vital signs and the nursing notes.   HISTORY  Chief Complaint Wound Infection  HPI [name] is a 72 y.o. female with a history of diabetes and hypertension as well as right leg cellulitis was presented to the emergency department today with increased pain to her right lower extremity.  She states that she has been on Keflex for erythema as well as suspected infection and has doses for both today and tomorrow.  However, the pain is increased.  Says that she also has felt that the skin is hardened to the left lower extremity.  No worsening of the erythema or swelling.  Patient states that she is a history of dermatitis to the right lower extremity which has become superinfected.  Past Medical History:  Diagnosis Date  . Diabetes mellitus without complication (HCC)   . Hyperlipidemia   . Hypertension     Patient Active Problem List   Diagnosis Date Noted  . Cellulitis of right leg 05/05/2018  . Essential hypertension, benign 10/22/2016  . Diabetes (HCC) 10/22/2016  . Chronic venous insufficiency 10/22/2016  . Swelling of limb 10/22/2016  . Pain in limb 10/22/2016    History reviewed. No pertinent surgical history.  Prior to Admission medications   Medication Sig Start Date End Date Taking? Authorizing Provider  amLODipine (NORVASC) 10 MG tablet Take 10 mg by mouth daily.   Yes [provider]  aspirin EC 81 MG tablet Take 81 mg by mouth daily.   Yes [provider]  benazepril (LOTENSIN) 20 MG tablet Take 20 mg by mouth daily. 07/29/16  Yes [provider]  celecoxib (CELEBREX) 200 MG capsule Take 200 mg by mouth daily.   Yes [provider]  Cholecalciferol (VITAMIN D-3) 5000 units TABS Take 5,000 Units by mouth daily.    Yes [provider]  gabapentin (NEURONTIN) 300 MG capsule Take 300 mg by mouth 3 (three) times daily.    Yes [provider]  glipiZIDE (GLUCOTROL XL) 5 MG 24 hr tablet Take 5 mg by mouth 2 (two) times daily. 10/15/16  Yes [provider]  hydrochlorothiazide (HYDRODIURIL) 25 MG tablet Take 25 mg by mouth 2 (two) times daily. 10/15/16  Yes [provider]  hydrocortisone 2.5 % cream Apply topically 2 (two) times daily. 10/31/16  Yes Vivi Barrack, DPM  KOMBIGLYZE XR 2.02-999 MG TB24 TAKE 1 TABLET 2 TIMES A DAY ORALLY 10/16/16  Yes [provider]  metoprolol succinate (TOPROL-XL) 50 MG 24 hr tablet TAKE 1 TABLET ONCE A DAY ORALLY 10/01/16  Yes [provider]  rosuvastatin (CRESTOR) 10 MG tablet Take 10 mg by mouth daily.   Yes [provider]    Allergies Patient has no known allergies.  Family History  Problem Relation Age of Onset  . Breast cancer Sister 68  . Breast cancer Maternal Aunt     Social History Social History   Tobacco Use  . Smoking status: Former Games developer  . Smokeless tobacco: Never Used  Substance Use Topics  . Alcohol use: No  . Drug use: No    Review of Systems  Constitutional: No fever/chills Eyes: No visual changes. ENT: No sore throat. Cardiovascular: Denies chest pain. Respiratory: Denies shortness of breath. Gastrointestinal: No abdominal pain.  No nausea, no vomiting.  No diarrhea.  No constipation.  Genitourinary: Negative for dysuria. Musculoskeletal: Negative for back pain. Skin: As above Neurological: Negative for headaches, focal weakness or numbness.   ____________________________________________   PHYSICAL EXAM:  VITAL SIGNS: ED Triage Vitals  Enc Vitals Group     BP 05/05/18 0925 (!) 151/67     Pulse Rate 05/05/18 0925 73     Resp 05/05/18 0925 18     Temp 05/05/18 0925 97.8 F (36.6 C)     Temp Source 05/05/18 0925 Oral     SpO2 05/05/18 0925 95 %     Weight  05/05/18 0926 190 lb (86.2 kg)     Height 05/05/18 0926 5\' 10"  (1.778 m)     Head Circumference --      Peak Flow --      Pain Score 05/05/18 0925 10     Pain Loc --      Pain Edu? --      Excl. in GC? --     Constitutional: Alert and oriented. Well appearing and in no acute distress. Eyes: Conjunctivae are normal.  Head: Atraumatic. Nose: No congestion/rhinnorhea. Mouth/Throat: Mucous membranes are moist.  Neck: No stridor.   Cardiovascular: Normal rate, regular rhythm. Grossly normal heart sounds.  Good peripheral circulation with equal and bilateral dorsalis pedis pulses. Respiratory: Normal respiratory effort.  No retractions. Lungs CTAB. Gastrointestinal: Soft and nontender. No distention. No CVA tenderness. Musculoskeletal: Right lower extremity is erythematous and warm from the proximal foot to the proximal calf.  There is ulceration laterally with a mild amount of exudate as well as medially.  The lateral ulceration is approximately 3 x 6 cm and the medial is approximately covering an area of 4 x 8 cm.  There appears to be granulation tissue that has filled in up to the level of the skin.  Mild tenderness to palpation.  Induration also palpated especially posterior on the calf. Neurologic:  Normal speech and language. No gross focal neurologic deficits are appreciated. Skin:  Skin is warm, dry and intact. No rash noted. Psychiatric: Mood and affect are normal. Speech and behavior are normal.  ____________________________________________   LABS (all labs ordered are listed, but only abnormal results are displayed)  Labs Reviewed  BASIC METABOLIC PANEL - Abnormal; Notable for the following components:      Result Value   Glucose, Bld 144 (*)    GFR calc non Af Amer 55 (*)    All other components within normal limits  CBC  URINALYSIS, COMPLETE (UACMP) WITH MICROSCOPIC  CBG MONITORING, ED    ____________________________________________  EKG   ____________________________________________  RADIOLOGY   ____________________________________________   PROCEDURES  Procedure(s) performed:   Procedures  Critical Care performed:   ____________________________________________   INITIAL IMPRESSION / ASSESSMENT AND PLAN / ED COURSE  Pertinent labs & imaging results that were available during my care of the patient were reviewed by me and considered in my medical decision making (see chart for details).  DDX: Dermatitis, cellulitis, DVT As part of my medical decision making, I reviewed the following data within the electronic MEDICAL RECORD NUMBEROutpatient records  ----------------------------------------- 11:28 AM on 05/05/2018 -----------------------------------------  Patient with reassuring lab work but likely with superinfection of her dermatitis.  She will be admitted to the hospital on IV antibiotics.  Appears to have failed Keflex.  She is understanding of the treatment plan as well as the diagnosis and willing to comply.  Signed out to Dr. Imogene Burn. ____________________________________________   FINAL CLINICAL IMPRESSION(S) / ED DIAGNOSES  Right lower  extremity cellulitis.  NEW MEDICATIONS STARTED DURING THIS VISIT:  New Prescriptions   No medications on file     Note:  This document was prepared using Dragon voice recognition software and may include unintentional dictation errors.     Myrna Blazer, MD 05/05/18 1128

## 2018-05-05 NOTE — Progress Notes (Signed)
Pharmacy Antibiotic Note  [name] is a 72 y.o. female admitted on 05/05/2018 with cellulitis.  Pharmacy has been consulted for Zosyn and vancomycin dosing.  Plan: 1. Zosyn 3.375 gm IV Q8H EI 2. Vancomycin 1 gm IV x 1 in ED followed in approximately 6 hours (stacked dosing) by vancomycin 1.25 gm IV Q18H, predicted trough 15 mcg/ml. Pharmacy will continue to follow and adjust as needed to maintain trough 10 to 15 mcg/ml.   Vd 52.7 L, Ke 0.054 hr-1, T1/2 12.8 hr  Height: 5\' 10"  (177.8 cm) Weight: 190 lb (86.2 kg) IBW/kg (Calculated) : 68.5  Temp (24hrs), Avg:97.8 F (36.6 C), Min:97.8 F (36.6 C), Max:97.8 F (36.6 C)  Recent Labs  Lab 05/05/18 0954  WBC 9.1  CREATININE 0.98    Estimated Creatinine Clearance: 60.1 mL/min (by C-G formula based on SCr of 0.98 mg/dL).    No Known Allergies  Antimicrobials this admission:   Dose adjustments this admission:   Microbiology results:  BCx:   UCx:    Sputum:    MRSA PCR:   Thank you for allowing pharmacy to be a part of this patient's care.  Carola Frost, Pharm.D., BCPS Clinical Pharmacist 05/05/2018 1:08 PM

## 2018-05-05 NOTE — ED Triage Notes (Addendum)
Pt arrives via POV with complaints of wounds on right foot and ankle. Lower right leg red, swollen and hot to touch. Has dermatitis. States it began on back of lower right leg with an itchy spot (dermatitis; hx of the same) and has gotten worse. Now has three areas: to medial right ankle, lateral distal lower leg and distal calf. Hx diabetes.  Dr. Adolphus Birchwood placed pt on Cephalexin for staph infection 7/22. Has also completed round of Doxycycline for infection. Next appointment was August 5.  Pulses palpable 1+.

## 2018-05-05 NOTE — ED Notes (Signed)
Corrie Dandy, RN notified patient en route to room 5.

## 2018-05-06 ENCOUNTER — Inpatient Hospital Stay: Payer: Medicare HMO

## 2018-05-06 LAB — BASIC METABOLIC PANEL
Anion gap: 7 (ref 5–15)
BUN: 19 mg/dL (ref 8–23)
CALCIUM: 9.1 mg/dL (ref 8.9–10.3)
CO2: 30 mmol/L (ref 22–32)
CREATININE: 1.06 mg/dL — AB (ref 0.44–1.00)
Chloride: 102 mmol/L (ref 98–111)
GFR calc non Af Amer: 50 mL/min — ABNORMAL LOW (ref >60)
GFR, EST AFRICAN AMERICAN: 58 mL/min — AB (ref >60)
Glucose, Bld: 141 mg/dL — ABNORMAL HIGH (ref 70–99)
Potassium: 3.6 mmol/L (ref 3.5–5.1)
SODIUM: 139 mmol/L (ref 135–145)

## 2018-05-06 LAB — GLUCOSE, CAPILLARY
GLUCOSE-CAPILLARY: 139 mg/dL — AB (ref 70–99)
GLUCOSE-CAPILLARY: 161 mg/dL — AB (ref 70–99)
Glucose-Capillary: 134 mg/dL — ABNORMAL HIGH (ref 70–99)
Glucose-Capillary: 174 mg/dL — ABNORMAL HIGH (ref 70–99)

## 2018-05-06 LAB — CBC
HCT: 35.1 % (ref 35.0–47.0)
Hemoglobin: 12.2 g/dL (ref 12.0–16.0)
MCH: 30.3 pg (ref 26.0–34.0)
MCHC: 34.8 g/dL (ref 32.0–36.0)
MCV: 87 fL (ref 80.0–100.0)
PLATELETS: 264 10*3/uL (ref 150–440)
RBC: 4.04 MIL/uL (ref 3.80–5.20)
RDW: 13.1 % (ref 11.5–14.5)
WBC: 6.4 10*3/uL (ref 3.6–11.0)

## 2018-05-06 NOTE — Progress Notes (Signed)
[facility]   PATIENT NAME: [name]    MR#:  [id number]  DATE OF BIRTH:  Apr 12, 1946  SUBJECTIVE:  CHIEF COMPLAINT:   Chief Complaint  Patient presents with  . Wound Infection  Patient seen and evaluated today No fever Decreased pain in the right leg   REVIEW OF SYSTEMS:    ROS  CONSTITUTIONAL: No documented fever. No fatigue, weakness. No weight gain, no weight loss.  EYES: No blurry or double vision.  ENT: No tinnitus. No postnasal drip. No redness of the oropharynx.  RESPIRATORY: No cough, no wheeze, no hemoptysis. No dyspnea.  CARDIOVASCULAR: No chest pain. No orthopnea. No palpitations. No syncope.  GASTROINTESTINAL: No nausea, no vomiting or diarrhea. No abdominal pain. No melena or hematochezia.  GENITOURINARY: No dysuria or hematuria.  ENDOCRINE: No polyuria or nocturia. No heat or cold intolerance.  HEMATOLOGY: No anemia. No bruising. No bleeding.  INTEGUMENTARY: No rashes. No lesions.  MUSCULOSKELETAL: No arthritis. No swelling. No gout.  Has right leg pain NEUROLOGIC: No numbness, tingling, or ataxia. No seizure-type activity.  PSYCHIATRIC: No anxiety. No insomnia. No ADD.   DRUG ALLERGIES:  No Known Allergies  VITALS:  Blood pressure (!) 150/61, pulse 70, temperature 98.5 F (36.9 C), temperature source Oral, resp. rate 18, height 5\' 10"  (1.778 m), weight 86.2 kg (190 lb), SpO2 94 %.  PHYSICAL EXAMINATION:   Physical Exam  GENERAL:  72 y.o.-year-old patient lying in the bed with no acute distress.  EYES: Pupils equal, round, reactive to light and accommodation. No scleral icterus. Extraocular muscles intact.  HEENT: Head atraumatic, normocephalic. Oropharynx and nasopharynx clear.  NECK:  Supple, no jugular venous distention. No thyroid enlargement, no tenderness.  LUNGS: Normal breath sounds bilaterally, no wheezing, rales, rhonchi. No use of accessory muscles of respiration.  CARDIOVASCULAR: S1, S2 normal. No  murmurs, rubs, or gallops.  ABDOMEN: Soft, nontender, nondistended. Bowel sounds present. No organomegaly or mass.  EXTREMITIES: No cyanosis, clubbing or edema b/l.    Bandage to right lower extremity. NEUROLOGIC: Cranial nerves II through XII are intact. No focal Motor or sensory deficits b/l.   PSYCHIATRIC: The patient is alert and oriented x 3.  SKIN: No obvious rash, lesion, or ulcer.   LABORATORY PANEL:   CBC Recent Labs  Lab 05/06/18 0303  WBC 6.4  HGB 12.2  HCT 35.1  PLT 264   ------------------------------------------------------------------------------------------------------------------ Chemistries  Recent Labs  Lab 05/06/18 0303  NA 139  K 3.6  CL 102  CO2 30  GLUCOSE 141*  BUN 19  CREATININE 1.06*  CALCIUM 9.1   ------------------------------------------------------------------------------------------------------------------  Cardiac Enzymes No results for input(s): TROPONINI in the last 168 hours. ------------------------------------------------------------------------------------------------------------------  RADIOLOGY:  Dg Foot 2 Views Right  Result Date: 05/06/2018 CLINICAL DATA:  Nonhealing diabetic ulcers.  Right foot pain. EXAM: RIGHT FOOT - 2 VIEW COMPARISON:  None. FINDINGS: There is no evidence of fracture or dislocation. There is no evidence of arthropathy or other focal bone abnormality. No lytic destruction is seen to suggest osteomyelitis. Soft tissues are unremarkable. IMPRESSION: No definite abnormality seen in the right foot. Electronically Signed   By: Lupita Raider, M.D.   On: 05/06/2018 09:14     ASSESSMENT AND PLAN:  72 year old female patient with history of diabetes mellitus type 2, hypertension, hyperlipidemia currently under hospitalist service for right lower extremity wound and infection  -Right lower extremity cellulitis with wound Continue broad-spectrum IV vancomycin and Zosyn antibiotics Follow-up cultures X-ray of the  right foot showed no bone invasion or osteomyelitis Wound care consult  -Type 2 diabetes mellitus Diabetic diet Sliding scale coverage with insulin  -DVT prophylaxis subcu Lovenox daily  -Hypertension stable Continue home meds   All the records are reviewed and case discussed with Care Management/Social Worker. Management plans discussed with the patient, family and they are in agreement.  CODE STATUS: Full code  DVT Prophylaxis: SCDs  TOTAL TIME TAKING CARE OF THIS PATIENT: 35 minutes.   POSSIBLE D/C IN 2 to 3 DAYS, DEPENDING ON CLINICAL CONDITION.  Ihor Austin M.D on 05/06/2018 at 12:56 PM  Between 7am to 6pm - Pager - [phone]  After 6pm go to [email or web address] - password [email or web address] [facility]  [facility]  Office  [phone]  CC: Primary care physician; Sherrie Mustache, MD (Inactive)  Note: This dictation was prepared with Dragon dictation along with smaller phrase technology. Any transcriptional errors that result from this process are unintentional.

## 2018-05-07 DIAGNOSIS — L03115 Cellulitis of right lower limb: Principal | ICD-10-CM

## 2018-05-07 DIAGNOSIS — I872 Venous insufficiency (chronic) (peripheral): Secondary | ICD-10-CM

## 2018-05-07 DIAGNOSIS — L97909 Non-pressure chronic ulcer of unspecified part of unspecified lower leg with unspecified severity: Secondary | ICD-10-CM

## 2018-05-07 LAB — GLUCOSE, CAPILLARY
GLUCOSE-CAPILLARY: 146 mg/dL — AB (ref 70–99)
GLUCOSE-CAPILLARY: 148 mg/dL — AB (ref 70–99)
Glucose-Capillary: 174 mg/dL — ABNORMAL HIGH (ref 70–99)
Glucose-Capillary: 147 mg/dL — ABNORMAL HIGH (ref 70–99)

## 2018-05-07 NOTE — Consult Note (Signed)
SURGICAL CONSULTATION NOTE (initial) - cpt: 37169  HISTORY OF PRESENT ILLNESS (HPI):  72 y.o. female presented to [facility] ED 2 days ago for evaluation of Right leg and ankle cellulitis. Patient reports long-standing chronic venous stasis edema with severe dermatitis. 6 months ago, patient first noted a superficial sloughing and "burning" medial supra-malleolar erythematous wound, which has since then worsened and become increasingly painful with recent worsening of surrounding cellulitis over the past week. She's also over the past several months similarly developed a similar wound of her posterior calf over the same period of time. Though patient states her RLE >>> LLE edema is substantial with weeping of her skin within an hour of standing or sitting despite what she describes to be saphenous venous ablation 10 years ago and subsequent vascular surgery management by Dr. Wyn Quaker and Dr. Gilda Crease (last seen by Dr. Wyn Quaker in 2018), she does not wear recommended compression stockings due to burning sensation to her skin with even light touch. She denies known history of DVT. Patient reports her lower extremity edema, erythema, and pain have all improved since admission with antibiotics and elevation of her RLE on 1 - 2 pillows and denies fever/chills, CP, or SOB.  Surgery is consulted by medical physician Dr. Tobi Bastos in this context for evaluation and management of RLE wounds and cellulitis.  PAST MEDICAL HISTORY (PMH):  Past Medical History:  Diagnosis Date  . Diabetes mellitus without complication (HCC)   . Hyperlipidemia   . Hypertension      PAST SURGICAL HISTORY (PSH):  History reviewed. No pertinent surgical history.   MEDICATIONS:  Prior to Admission medications   Medication Sig Start Date End Date Taking? Authorizing Provider  amLODipine (NORVASC) 10 MG tablet Take 10 mg by mouth daily.   Yes [provider]  aspirin EC 81 MG tablet Take 81 mg by mouth daily.   Yes [provider]  benazepril (LOTENSIN) 20 MG tablet Take 20 mg by mouth daily. 07/29/16  Yes [provider]  celecoxib (CELEBREX) 200 MG capsule Take 200 mg by mouth daily.   Yes [provider]  Cholecalciferol (VITAMIN D-3) 5000 units TABS Take 5,000 Units by mouth daily.   Yes [provider]  gabapentin (NEURONTIN) 300 MG capsule Take 300 mg by mouth 3 (three) times daily.    Yes [provider]  glipiZIDE (GLUCOTROL XL) 5 MG 24 hr tablet Take 5 mg by mouth 2 (two) times daily. 10/15/16  Yes [provider]  hydrochlorothiazide (HYDRODIURIL) 25 MG tablet Take 25 mg by mouth 2 (two) times daily. 10/15/16  Yes [provider]  hydrocortisone 2.5 % cream Apply topically 2 (two) times daily. 10/31/16  Yes Vivi Barrack, DPM  KOMBIGLYZE XR 2.02-999 MG TB24 TAKE 1 TABLET 2 TIMES A DAY ORALLY 10/16/16  Yes [provider]  metoprolol succinate (TOPROL-XL) 50 MG 24 hr tablet TAKE 1 TABLET ONCE A DAY ORALLY 10/01/16  Yes [provider]  rosuvastatin (CRESTOR) 10 MG tablet Take 10 mg by mouth daily.   Yes [provider]     ALLERGIES:  No Known Allergies   SOCIAL HISTORY:  Social History   Socioeconomic History  . Marital status: Married    Spouse name: Not on file  . Number of children: Not on file  . Years of education: Not on file  . Highest education level: Not on file  Occupational History  . Not on file  Social Needs  . Financial resource strain: Not  on file  . Food insecurity:    Worry: Not on file    Inability: Not on file  . Transportation needs:    Medical: Not on file    Non-medical: Not on file  Tobacco Use  . Smoking status: Former Games developer  . Smokeless tobacco: Never Used  Substance and Sexual Activity  . Alcohol use: No  . Drug use: No  . Sexual activity: Not on file  Lifestyle  . Physical activity:    Days per week: Not on file    Minutes per session: Not on file  . Stress: Not on file   Relationships  . Social connections:    Talks on phone: Not on file    Gets together: Not on file    Attends religious service: Not on file    Active member of club or organization: Not on file    Attends meetings of clubs or organizations: Not on file    Relationship status: Not on file  . Intimate partner violence:    Fear of current or ex partner: Not on file    Emotionally abused: Not on file    Physically abused: Not on file    Forced sexual activity: Not on file  Other Topics Concern  . Not on file  Social History Narrative  . Not on file    The patient currently resides (home / rehab facility / nursing home): Home The patient normally is (ambulatory / bedbound): Limited ambulation   FAMILY HISTORY:  Family History  Problem Relation Age of Onset  . Breast cancer Sister 46  . Breast cancer Maternal Aunt      REVIEW OF SYSTEMS:  Constitutional: denies weight loss, fever, chills, or sweats  Eyes: denies any other vision changes, history of eye injury  ENT: denies sore throat, hearing problems  Respiratory: denies shortness of breath, wheezing  Cardiovascular: denies chest pain, palpitations  Gastrointestinal: denies abdominal pain, N/V, or diarrhea Genitourinary: denies burning with urination or urinary frequency Musculoskeletal: denies any other joint pains or cramps  Skin: denies any other rashes or skin discolorations except as per HPI Neurological: denies any other headache, dizziness, weakness  Psychiatric: denies any other depression, anxiety   All other review of systems were negative   VITAL SIGNS:  Temp:  [97.5 F (36.4 C)-97.8 F (36.6 C)] 97.5 F (36.4 C) (08/01 1638) Pulse Rate:  [61-68] 62 (08/01 1638) Resp:  [18] 18 (08/01 1638) BP: (135-145)/(65-81) 135/81 (08/01 1638) SpO2:  [95 %-97 %] 97 % (08/01 1638)     Height: 5\' 10"  (177.8 cm) Weight: 190 lb (86.2 kg) BMI (Calculated): 27.26   INTAKE/OUTPUT:  This shift: No intake/output data recorded.   Last 2 shifts: @IOLAST2SHIFTS @   PHYSICAL EXAM:  Constitutional:  -- Normal body habitus  -- Awake, alert, and oriented x3, no apparent distress Eyes:  -- Pupils equally round and reactive to light  -- No scleral icterus, B/L no occular discharge Ear, nose, throat: -- Neck is FROM WNL -- No jugular venous distension  Pulmonary:  -- No wheezes or rhales -- Equal breath sounds bilaterally -- Breathing non-labored at rest Cardiovascular:  -- S1, S2 present  -- No pericardial rubs  Gastrointestinal:  -- Abdomen soft, nontender, non-distended, no guarding or rebound tenderness -- No abdominal masses appreciated, pulsatile or otherwise  Musculoskeletal and Integumentary:  -- Wounds or skin discoloration: superficial tender sloughing and erythematous wounds of Right medial supramalleolar ankle and posterior calf > heal with moderate RLE >>  LLE pitting edema, RLE elevated currently on only 1 pillow with telfa and kerlix gauze dressing wrap, no abscess -- Extremities: B/L UE and LE FROM, hands and feet warm  Neurologic:  -- Motor function: Intact and symmetric -- Sensation: Intact and symmetric Psychiatric:  -- Mood and affect WNL  Pulse/Doppler Exam: (p=palpable; d=doppler signals; 0=none)     Right   Left   Rad  p   p   Fem  p   p   DP  p   p   Labs:  CBC Latest Ref Rng & Units 05/06/2018 05/05/2018  WBC 3.6 - 11.0 K/uL 6.4 9.1  Hemoglobin 12.0 - 16.0 g/dL 04.5 40.9  Hematocrit 81.1 - 47.0 % 35.1 38.1  Platelets 150 - 440 K/uL 264 299   CMP Latest Ref Rng & Units 05/06/2018 05/05/2018 10/28/2017  Glucose 70 - 99 mg/dL 914(N) 829(F) -  BUN 8 - 23 mg/dL 19 18 -  Creatinine 6.21 - 1.00 mg/dL 3.08(M) 5.78 -  Sodium 135 - 145 mmol/L 139 138 -  Potassium 3.5 - 5.1 mmol/L 3.6 3.7 -  Chloride 98 - 111 mmol/L 102 101 -  CO2 22 - 32 mmol/L 30 26 -  Calcium 8.9 - 10.3 mg/dL 9.1 9.7 -  Total Protein 6.5 - 8.1 g/dL - - 7.4  Total Bilirubin 0.3 - 1.2 mg/dL - - 0.7  Alkaline Phos 38 -  126 U/L - - 49  AST 15 - 41 U/L - - 36  ALT 14 - 54 U/L - - 56(H)   Imaging studies:  Right Foot X-ray (05/06/2018) - personally reviewed and discussed with patient There is no evidence of fracture or dislocation. There is no evidence of arthropathy or other focal bone abnormality. No lytic destruction is seen to suggest osteomyelitis. Soft tissues are unremarkable.  Assessment/Plan: (ICD-10's: I66.2) 72 y.o. female with multifactorial wounds attributable in part to venous stasis complicated by pressure ulceration, neuropathy, and by pertinent comorbidities including chronic lower extremity edema attributable to venous stasis with DM, HTN, and HLD.   - no indication for surgical intervention at this time  - elevation of RLE above heart level and pressure-offloading   - antibiotics, Neurontin, and medical management of co-morbidities per medical team   - consider Unna boot with outpatient venous reflux study and vascular surgery follow-up  - DVT prophylaxis  All of the above findings and recommendations were discussed with the patient and her RN, and all of patient's questions were answered to her expressed satisfaction.  Thank you for the opportunity to participate in this patient's care.   -- Scherrie Gerlach Earlene Plater, MD, RPVI [place]: [facility] General Surgery - Partnering for exceptional care. Office: [phone]

## 2018-05-07 NOTE — Progress Notes (Signed)
[facility] - [place] at [facility]   PATIENT NAME: [name]    MR#:  [id number]  DATE OF BIRTH:  12-30-1945  SUBJECTIVE:  CHIEF COMPLAINT:   Chief Complaint  Patient presents with  . Wound Infection  Patient seen and evaluated today No fever Decreased pain in the right leg Decreased discharge from right leg wound   REVIEW OF SYSTEMS:    ROS  CONSTITUTIONAL: No documented fever. No fatigue, weakness. No weight gain, no weight loss.  EYES: No blurry or double vision.  ENT: No tinnitus. No postnasal drip. No redness of the oropharynx.  RESPIRATORY: No cough, no wheeze, no hemoptysis. No dyspnea.  CARDIOVASCULAR: No chest pain. No orthopnea. No palpitations. No syncope.  GASTROINTESTINAL: No nausea, no vomiting or diarrhea. No abdominal pain. No melena or hematochezia.  GENITOURINARY: No dysuria or hematuria.  ENDOCRINE: No polyuria or nocturia. No heat or cold intolerance.  HEMATOLOGY: No anemia. No bruising. No bleeding.  INTEGUMENTARY: No rashes. No lesions.  MUSCULOSKELETAL: No arthritis. No swelling. No gout.  Has right leg and heel pain NEUROLOGIC: No numbness, tingling, or ataxia. No seizure-type activity.  PSYCHIATRIC: No anxiety. No insomnia. No ADD.   DRUG ALLERGIES:  No Known Allergies  VITALS:  Blood pressure (!) 144/69, pulse 68, temperature 97.8 F (36.6 C), temperature source Oral, resp. rate 18, height 5\' 10"  (1.778 m), weight 190 lb (86.2 kg), SpO2 97 %.  PHYSICAL EXAMINATION:   Physical Exam  GENERAL:  72 y.o.-year-old patient lying in the bed with no acute distress.  EYES: Pupils equal, round, reactive to light and accommodation. No scleral icterus. Extraocular muscles intact.  HEENT: Head atraumatic, normocephalic. Oropharynx and nasopharynx clear.  NECK:  Supple, no jugular venous distention. No thyroid enlargement, no tenderness.  LUNGS: Normal breath sounds bilaterally, no wheezing, rales, rhonchi. No use of accessory muscles  of respiration.  CARDIOVASCULAR: S1, S2 normal. No murmurs, rubs, or gallops.  ABDOMEN: Soft, nontender, nondistended. Bowel sounds present. No organomegaly or mass.  EXTREMITIES: No cyanosis, clubbing or edema b/l.    Bandage to right lower extremity. NEUROLOGIC: Cranial nerves II through XII are intact. No focal Motor or sensory deficits b/l.   PSYCHIATRIC: The patient is alert and oriented x 3.  SKIN:  Wound type: Full thickness wound to inner ankle; 7X7X.2cm irregular shaped, 80% yellow, 20% red, small amt yellow drainage, no odor or fluctuance.  Right posterior calf with full thickness wound; 1X1X.1cm, 50% red, 50% yellow, small amt yellow drainage, no odor or fluctuance. Generalized edema and erythema surrounding.  LABORATORY PANEL:   CBC Recent Labs  Lab 05/06/18 0303  WBC 6.4  HGB 12.2  HCT 35.1  PLT 264   ------------------------------------------------------------------------------------------------------------------ Chemistries  Recent Labs  Lab 05/06/18 0303  NA 139  K 3.6  CL 102  CO2 30  GLUCOSE 141*  BUN 19  CREATININE 1.06*  CALCIUM 9.1   ------------------------------------------------------------------------------------------------------------------  Cardiac Enzymes No results for input(s): TROPONINI in the last 168 hours. ------------------------------------------------------------------------------------------------------------------  RADIOLOGY:  Dg Foot 2 Views Right  Result Date: 05/06/2018 CLINICAL DATA:  Nonhealing diabetic ulcers.  Right foot pain. EXAM: RIGHT FOOT - 2 VIEW COMPARISON:  None. FINDINGS: There is no evidence of fracture or dislocation. There is no evidence of arthropathy or other focal bone abnormality. No lytic destruction is seen to suggest osteomyelitis. Soft tissues are unremarkable. IMPRESSION: No definite abnormality seen in the right foot. Electronically Signed   By: Lupita Raider, M.D.   On: 05/06/2018  09:14      ASSESSMENT AND PLAN:  72 year old female patient with history of diabetes mellitus type 2, hypertension, hyperlipidemia currently under hospitalist service for right lower extremity wound and infection  -Right lower extremity cellulitis with wound Continue broad-spectrum IV vancomycin and Zosyn antibiotics Obtain wound culture report from dermatology office and taper abx X-ray of the right foot showed no bone invasion or osteomyelitis Wound care consult appreciated Follow recommendations Surgery consult for any debridement if needed  -Type 2 diabetes mellitus Diabetic diet Sliding scale coverage with insulin  -DVT prophylaxis subcu Lovenox daily  -Hypertension stable Continue home meds   All the records are reviewed and case discussed with Care Management/Social Worker. Management plans discussed with the patient, family and they are in agreement.  CODE STATUS: Full code  DVT Prophylaxis: SCDs  TOTAL TIME TAKING CARE OF THIS PATIENT: 32 minutes.   POSSIBLE D/C IN 1 DAYS, DEPENDING ON CLINICAL CONDITION.  Ihor Austin M.D on 05/07/2018 at 12:00 PM  Between 7am to 6pm - Pager - [phone]  After 6pm go to [email or web address] - password [email or web address] [facility]  [facility] [place] [facility]  Office  [phone]  CC: Primary care physician; Sherrie Mustache, MD (Inactive)  Note: This dictation was prepared with Dragon dictation along with smaller phrase technology. Any transcriptional errors that result from this process are unintentional.

## 2018-05-07 NOTE — Consult Note (Signed)
WOC consult requested for right ankle.  This was already performed on 7/30; please refer to previous consult note for assessment and measurements, and topical treatment orders have been provided for staff nurses to perform daily.  X-ray did not indicate fracture or abscess.  Pt was informed regarding wound care to the location after discharge. Please re-consult if further assistance is needed.  Thank-you,  Cammie Mcgee MSN, RN, CWOCN, [place], CNS [phone]

## 2018-05-08 LAB — VANCOMYCIN, TROUGH: VANCOMYCIN TR: 37 ug/mL — AB (ref 15–20)

## 2018-05-08 LAB — GLUCOSE, CAPILLARY
GLUCOSE-CAPILLARY: 151 mg/dL — AB (ref 70–99)
Glucose-Capillary: 187 mg/dL — ABNORMAL HIGH (ref 70–99)

## 2018-05-08 LAB — BASIC METABOLIC PANEL
Anion gap: 9 (ref 5–15)
BUN: 23 mg/dL (ref 8–23)
CALCIUM: 9.4 mg/dL (ref 8.9–10.3)
CO2: 27 mmol/L (ref 22–32)
CREATININE: 0.82 mg/dL (ref 0.44–1.00)
Chloride: 101 mmol/L (ref 98–111)
GFR calc non Af Amer: >60 mL/min (ref >60)
Glucose, Bld: 151 mg/dL — ABNORMAL HIGH (ref 70–99)
Potassium: 3.9 mmol/L (ref 3.5–5.1)
SODIUM: 137 mmol/L (ref 135–145)

## 2018-05-08 MED ORDER — HYDROCODONE-ACETAMINOPHEN 5-325 MG PO TABS: 1.0000 | ORAL_TABLET | ORAL | 0 refills | Status: AC | PRN

## 2018-05-08 MED ORDER — DOXYCYCLINE HYCLATE 50 MG PO CAPS: 100.0000 mg | ORAL_CAPSULE | Freq: Two times a day (BID) | ORAL | 0 refills | Status: AC

## 2018-05-08 NOTE — Discharge Planning (Signed)
Patient IV removed. RN assessment and VS revealed stability for DC to home.  Discharge papers given, explained and educated on self and wound care.  Informed of suggested FU appt and appt made.  Given paper signed pain script and anbx.  Wheeled to front and family transported home via car.

## 2018-05-08 NOTE — Progress Notes (Addendum)
Pharmacy Antibiotic Note  [name] is a 72 y.o. female admitted on 05/05/2018 with cellulitis.  Pharmacy has been consulted for Zosyn and vancomycin dosing.  Plan: 1. Zosyn 3.375 gm IV Q8H EI 2. Vancomycin 1 gm IV x 1 in ED followed in approximately 6 hours (stacked dosing) by vancomycin 1.25 gm IV Q18H, predicted trough 15 mcg/ml. Pharmacy will continue to follow and adjust as needed to maintain trough 10 to 15 mcg/ml.   Vd 52.7 L, Ke 0.054 hr-1, T1/2 12.8 hr  08/01 @ 2237 VT 37 was drawn 1 hour after the dose was being given, so this is more of a peak. Will continue current regimen and recheck a trough 08/02 @ 1600. Drawing BMP to assess renal function.  08/02 @ 0200 Scr 0.82 >> 1.06 >> 0.98. Renal function significantly improving. Will assess vanc regimen after trough draw 08/02 @ 1600.  Height: 5\' 10"  (177.8 cm) Weight: 190 lb (86.2 kg) IBW/kg (Calculated) : 68.5  Temp (24hrs), Avg:97.7 F (36.5 C), Min:97.5 F (36.4 C), Max:97.9 F (36.6 C)  Recent Labs  Lab 05/05/18 0954 05/06/18 0303 05/07/18 2237  WBC 9.1 6.4  --   CREATININE 0.98 1.06*  --   VANCOTROUGH  --   --  37*    Estimated Creatinine Clearance: 55.6 mL/min (A) (by C-G formula based on SCr of 1.06 mg/dL (H)).    No Known Allergies  Antimicrobials this admission:   Dose adjustments this admission:   Microbiology results:  BCx:   UCx:    Sputum:    MRSA PCR:   Thank you for allowing pharmacy to be a part of this patient's care.  Thomasene Ripple, Pharm.D., BCPS Clinical Pharmacist 05/08/2018 12:29 AM

## 2018-05-08 NOTE — Progress Notes (Signed)
Pharmacy Antibiotic Note  [name] is a 72 y.o. female admitted on 05/05/2018 with cellulitis.  No concern for osteomyelitis at this time. Per surgeon, no plan for surgical intervention. Pharmacy has been consulted for vancomycin dosing. Current vancomycin dose is 1250 mg IV q18h with goal trough 10-15 mcg/ml.  Plan: Vanc trough to be drawn at 1400 today. Patient's renal function is improving. Pharmacy will adjust regimen per level. Will follow up culture to de-escalate ABX therapy as appropriate.  Height: 5\' 10"  (177.8 cm) Weight: 190 lb (86.2 kg) IBW/kg (Calculated) : 68.5  Temp (24hrs), Avg:97.9 F (36.6 C), Min:97.5 F (36.4 C), Max:98.3 F (36.8 C)  Recent Labs  Lab 05/05/18 0954 05/06/18 0303 05/07/18 2237 05/08/18 0039  WBC 9.1 6.4  --   --   CREATININE 0.98 1.06*  --  0.82  VANCOTROUGH  --   --  37*  --     Estimated Creatinine Clearance: 71.8 mL/min (by C-G formula based on SCr of 0.82 mg/dL).    No Known Allergies  Antimicrobials this admission: Vanc 7/30 >>  Zosyn 7/30 >> 8/1  Dose adjustments this admission: 8/1 VT 37 drawn 1 hr after dose hung. Trough rescheduled for today at 1600  Microbiology results: 8/1 Culture: rare gram positive cocci  Thank you for allowing pharmacy to be a part of this patient's care.  Pricilla Riffle, PharmD Pharmacy Resident  05/08/2018 8:45 AM

## 2018-05-08 NOTE — Care Management Important Message (Signed)
Important Message  Patient Details  Name: [name] MRN: [id number] Date of Birth: 06-23-1946   Medicare Important Message Given:  Yes    Olegario Messier A Overton Boggus 05/08/2018, 11:02 AM

## 2018-05-08 NOTE — Progress Notes (Signed)
Advanced care plan. Purpose of the Encounter: CODE STATUS Parties in Attendance:Patient Patient's Decision Capacity:Good Subjective/Patient's story: Presented for right foot wound Objective/Medical story Has right leg and foot cellulitis Needs IV abx Goals of care determination:  Advance care directives and goals of care discused Patient wants everything done which includes cpr, intubation and ventilator if need arises CODE STATUS: Full code Time spent discussing advanced care planning: 16 minutes

## 2018-05-08 NOTE — Discharge Summary (Signed)
[facility] - [place] at [facility]   PATIENT NAME: [name]    MR#:  [id number]  DATE OF BIRTH:  03-21-1946  DATE OF ADMISSION:  05/05/2018 ADMITTING PHYSICIAN: Shaune Pollack, MD  DATE OF DISCHARGE: 05/08/2018  PRIMARY CARE PHYSICIAN: Sherrie Mustache, MD (Inactive)   ADMISSION DIAGNOSIS:  Cellulitis, unspecified cellulitis site [L03.90] Hypertension Hyperlipidemia Type 2 diabetes mellitus DISCHARGE DIAGNOSIS:  Active Problems:   Cellulitis of right leg Diabetes mellitus type 2 Hypertension Hyperlipidemia  SECONDARY DIAGNOSIS:   Past Medical History:  Diagnosis Date  . Diabetes mellitus without complication (HCC)   . Hyperlipidemia   . Hypertension      ADMITTING HISTORY [name]  is a 72 y.o. female with a known history of hypertension, diabetes and hyperlipidemia. history of dermatitis to the right lower extremity. Her right leg has been swelling, pain and ride for 2 to 3 weeks.  She was given Keflex by PCP without improvement.  Her symptoms has been worsening.  He also complains of right heel pain on walking.  HOSPITAL COURSE:  Patient admitted to medical floor and put on broad-spectrum IV antibiotics vancomycin and cefepime.  She had wound care consultation during the hospitalization.  Patient tolerated IV antibiotics well.  Her cellulitis in the right leg improved.  She was seen by surgery for right foot wound.  No surgical debridement or intervention recommended.  Patient will be discharged to home on oral doxycycline antibiotic.  She will be having an appointment with wound care clinic for further management as outpatient for right foot wound.  She was worked up with x-ray of the right foot which showed no bone involvement or osteomyelitis.  Her pain in the right heel right foot also improved.  CONSULTS OBTAINED:  Treatment Team:  Ancil Linsey, MD  DRUG ALLERGIES:  No Known Allergies  DISCHARGE MEDICATIONS:   Allergies as of 05/08/2018   No  Known Allergies     Medication List    TAKE these medications   amLODipine 10 MG tablet Commonly known as:  NORVASC Take 10 mg by mouth daily.   aspirin EC 81 MG tablet Take 81 mg by mouth daily.   benazepril 20 MG tablet Commonly known as:  LOTENSIN Take 20 mg by mouth daily.   celecoxib 200 MG capsule Commonly known as:  CELEBREX Take 200 mg by mouth daily.   doxycycline 50 MG capsule Commonly known as:  VIBRAMYCIN Take 2 capsules (100 mg total) by mouth 2 (two) times daily for 7 days.   gabapentin 300 MG capsule Commonly known as:  NEURONTIN Take 300 mg by mouth 3 (three) times daily.   glipiZIDE 5 MG 24 hr tablet Commonly known as:  GLUCOTROL XL Take 5 mg by mouth 2 (two) times daily.   hydrochlorothiazide 25 MG tablet Commonly known as:  HYDRODIURIL Take 25 mg by mouth 2 (two) times daily.   HYDROcodone-acetaminophen 5-325 MG tablet Commonly known as:  NORCO/VICODIN Take 1 tablet by mouth every 4 (four) hours as needed for up to 5 days for moderate pain.   hydrocortisone 2.5 % cream Apply topically 2 (two) times daily.   KOMBIGLYZE XR 2.02-999 MG Tb24 Generic drug:  Saxagliptin-Metformin TAKE 1 TABLET 2 TIMES A DAY ORALLY   metoprolol succinate 50 MG 24 hr tablet Commonly known as:  TOPROL-XL TAKE 1 TABLET ONCE A DAY ORALLY   rosuvastatin 10 MG tablet Commonly known as:  CRESTOR Take 10 mg by mouth daily.   Vitamin D-3 5000  units Tabs Take 5,000 Units by mouth daily.       Today  Patient seen today Decreased pain in the right foot Decreased redness swelling in the right foot Tolerating diet well No fever  VITAL SIGNS:  Blood pressure (!) 148/82, pulse 82, temperature 98.3 F (36.8 C), temperature source Oral, resp. rate 17, height 5\' 10"  (1.778 m), weight 190 lb (86.2 kg), SpO2 95 %.  I/O:    Intake/Output Summary (Last 24 hours) at 05/08/2018 1229 Last data filed at 05/07/2018 2040 Gross per 24 hour  Intake 1200 ml  Output -  Net 1200  ml    PHYSICAL EXAMINATION:  Physical Exam  GENERAL:  72 y.o.-year-old patient lying in the bed with no acute distress.  LUNGS: Normal breath sounds bilaterally, no wheezing, rales,rhonchi or crepitation. No use of accessory muscles of respiration.  CARDIOVASCULAR: S1, S2 normal. No murmurs, rubs, or gallops.  ABDOMEN: Soft, non-tender, non-distended. Bowel sounds present. No organomegaly or mass.  NEUROLOGIC: Moves all 4 extremities. PSYCHIATRIC: The patient is alert and oriented x 3.  SKIN: Right ankle wound  DATA REVIEW:   CBC Recent Labs  Lab 05/06/18 0303  WBC 6.4  HGB 12.2  HCT 35.1  PLT 264    Chemistries  Recent Labs  Lab 05/08/18 0039  NA 137  K 3.9  CL 101  CO2 27  GLUCOSE 151*  BUN 23  CREATININE 0.82  CALCIUM 9.4    Cardiac Enzymes No results for input(s): TROPONINI in the last 168 hours.  Microbiology Results  Results for orders placed or performed during the hospital encounter of 05/05/18  Aerobic Culture (superficial specimen)     Status: None (Preliminary result)   Collection Time: 05/07/18 11:38 AM  Result Value Ref Range Status   Specimen Description   Final    ANKLE Performed at [facility], [address], [place], [place] [address]    Special Requests   Final    Normal Performed at [facility], [address] [place], [place] [address]    Gram Stain   Final    RARE WBC PRESENT,BOTH PMN AND MONONUCLEAR RARE GRAM POSITIVE COCCI    Culture   Final    NO GROWTH < 24 HOURS Performed at [facility], [address] [place], [place] [address]    Report Status PENDING  Incomplete    RADIOLOGY:  No results found.  Follow up with PCP in 1 week.  Management plans discussed with the patient, family and they are in agreement.  CODE STATUS: Full code    Code Status Orders  (From admission, onward)        Start     Ordered   05/05/18 1133  Full code  Continuous     05/05/18 1132    Code Status  History    This patient has a current code status but no historical code status.      TOTAL TIME TAKING CARE OF THIS PATIENT ON DAY OF DISCHARGE: more than 34 minutes.   Ihor Austin M.D on 05/08/2018 at 12:29 PM  Between 7am to 6pm - Pager - [phone]  After 6pm go to [email or web address] - password [email or web address] [facility]  [facility] [place] [facility]  Office  [phone]  CC: Primary care physician; Sherrie Mustache, MD (Inactive)  Note: This dictation was prepared with Dragon dictation along with smaller phrase technology. Any transcriptional errors that result from this process are unintentional.

## 2018-05-10 LAB — AEROBIC CULTURE  (SUPERFICIAL SPECIMEN): CULTURE: NORMAL

## 2018-05-10 LAB — AEROBIC CULTURE W GRAM STAIN (SUPERFICIAL SPECIMEN): Special Requests: NORMAL

## 2018-05-11 DIAGNOSIS — I872 Venous insufficiency (chronic) (peripheral): Secondary | ICD-10-CM

## 2018-05-11 DIAGNOSIS — L97909 Non-pressure chronic ulcer of unspecified part of unspecified lower leg with unspecified severity: Secondary | ICD-10-CM

## 2018-05-12 ENCOUNTER — Encounter: Payer: Medicare HMO | Attending: Nurse Practitioner | Admitting: Nurse Practitioner

## 2018-05-12 DIAGNOSIS — Z7984 Long term (current) use of oral hypoglycemic drugs: Secondary | ICD-10-CM | POA: Insufficient documentation

## 2018-05-12 DIAGNOSIS — I10 Essential (primary) hypertension: Secondary | ICD-10-CM | POA: Diagnosis not present

## 2018-05-12 DIAGNOSIS — E1140 Type 2 diabetes mellitus with diabetic neuropathy, unspecified: Secondary | ICD-10-CM | POA: Insufficient documentation

## 2018-05-12 DIAGNOSIS — L97819 Non-pressure chronic ulcer of other part of right lower leg with unspecified severity: Secondary | ICD-10-CM | POA: Diagnosis not present

## 2018-05-12 DIAGNOSIS — L97212 Non-pressure chronic ulcer of right calf with fat layer exposed: Secondary | ICD-10-CM | POA: Insufficient documentation

## 2018-05-12 DIAGNOSIS — L97312 Non-pressure chronic ulcer of right ankle with fat layer exposed: Secondary | ICD-10-CM | POA: Insufficient documentation

## 2018-05-12 DIAGNOSIS — E11622 Type 2 diabetes mellitus with other skin ulcer: Secondary | ICD-10-CM | POA: Insufficient documentation

## 2018-05-12 DIAGNOSIS — L950 Livedoid vasculitis: Secondary | ICD-10-CM | POA: Insufficient documentation

## 2018-05-12 DIAGNOSIS — Z87891 Personal history of nicotine dependence: Secondary | ICD-10-CM | POA: Diagnosis not present

## 2018-05-19 ENCOUNTER — Encounter: Payer: Medicare HMO | Admitting: Nurse Practitioner

## 2018-05-19 DIAGNOSIS — L950 Livedoid vasculitis: Secondary | ICD-10-CM | POA: Diagnosis not present

## 2018-05-21 NOTE — Progress Notes (Signed)
[name], [name] ([id number]) Visit Report for 05/12/2018 Abuse/Suicide Risk Screen Details Patient Name: [name], [name] Date of Service: 05/12/2018 9:45 AM Medical Record Number: [id number] Patient Account Number: [id number] Date of Birth/Sex: 17-Jul-1946 (72 y.o. F) Treating RN: Rema Jasmine Primary Care Maegen Wigle: Sherrie Mustache Other Clinician: Referring Faria Casella: DASHER, DAVID Treating Tyronza Happe/Extender: Kathreen Cosier in Treatment: 0 Abuse/Suicide Risk Screen Items Answer ABUSE/SUICIDE RISK SCREEN: Has anyone close to you tried to hurt or harm you recentlyo No Do you feel uncomfortable with anyone in your familyo No Has anyone forced you do things that you didnot want to doo No Do you have any thoughts of harming yourselfo No Patient displays signs or symptoms of abuse and/or neglect. No Electronic Signature(s) Signed: 05/12/2018 11:39:26 AM By: Rema Jasmine Entered By: Rema Jasmine on 05/12/2018 10:12:05 [name], [name] -------------------------------------------------------------------------------- Activities of Daily Living Details Patient Name: Marcia [name] Date of Service: 05/12/2018 9:45 AM Medical Record Number: [id number] Patient Account Number: [id number] Date of Birth/Sex: Jul 31, 1946 (72 y.o. F) Treating RN: Rema Jasmine Primary Care Alexis Mizuno: Sherrie Mustache Other Clinician: Referring Kaveh Kissinger: Adolphus Birchwood, DAVID Treating Sheetal Lyall/Extender: Kathreen Cosier in Treatment: 0 Activities of Daily Living Items Answer Activities of Daily Living (Please select one for each item) Drive Automobile Completely Able Take Medications Completely Able Use Telephone Completely Able Care for Appearance Completely Able Use Toilet Completely Able Bath / Shower Completely Able Dress Self Completely Able Feed Self Completely Able Walk Completely Able Get In / Out Bed Completely Able Housework Completely Able Prepare Meals Completely Able Handle Money Completely Able Shop for Self  Completely Able Electronic Signature(s) Signed: 05/12/2018 11:39:26 AM By: Rema Jasmine Entered By: Rema Jasmine on 05/12/2018 10:12:38 [name], [name] ([id number]) -------------------------------------------------------------------------------- Education Assessment Details Patient Name: Marcia [name] Date of Service: 05/12/2018 9:45 AM Medical Record Number: [id number] Patient Account Number: [id number] Date of Birth/Sex: 26-Nov-1945 (72 y.o. F) Treating RN: Rema Jasmine Primary Care Tenasia Aull: Sherrie Mustache Other Clinician: Referring Micayla Brathwaite: Adolphus Birchwood, DAVID Treating Trayton Szabo/Extender: Kathreen Cosier in Treatment: 0 Learning Preferences/Education Level/Primary Language Learning Preference: Explanation, Demonstration Highest Education Level: High School Preferred Language: Chinese Cognitive Barrier Assessment/Beliefs Language Barrier: No Translator Needed: No Memory Deficit: No Emotional Barrier: No Cultural/Religious Beliefs Affecting Medical Care: No Physical Barrier Assessment Impaired Vision: Yes Glasses Impaired Hearing: Yes Lt ear Knowledge/Comprehension Assessment Knowledge Level: High Comprehension Level: High Ability to understand written High instructions: Ability to understand verbal High instructions: Motivation Assessment Anxiety Level: Calm Cooperation: Cooperative Education Importance: Acknowledges Need Interest in Health Problems: Asks Questions Perception: Coherent Willingness to Engage in Self- High Management Activities: Readiness to Engage in Self- High Management Activities: Electronic Signature(s) Signed: 05/12/2018 11:39:26 AM By: Rema Jasmine Entered By: Rema Jasmine on 05/12/2018 10:14:31

## 2018-05-22 NOTE — Progress Notes (Signed)
[name], [name] ([id number]) Visit Report for 05/12/2018 Chief Complaint Document Details Patient Name: [name], [name] Date of Service: 05/12/2018 9:45 AM Medical Record Number: [id number] Patient Account Number: [id number] Date of Birth/Sex: [birth year] (72 y.o. F) Treating RN: Phillis Haggis Primary Care Provider: Sherrie Mustache Other Clinician: Referring Provider: Adolphus Birchwood, DAVID Treating Provider/Extender: Kathreen Cosier in Treatment: 0 Information Obtained from: Patient Chief Complaint RLE wounds Electronic Signature(s) Signed: 05/12/2018 12:19:08 PM By: Bonnell Public Entered By: Bonnell Public on 05/12/2018 12:19:08 Pierron, [name] ([id number]) -------------------------------------------------------------------------------- Debridement Details Patient Name: Marcia [name] Date of Service: 05/12/2018 9:45 AM Medical Record Number: [id number] Patient Account Number: [id number] Date of Birth/Sex: Jul 24, 1946 (72 y.o. F) Treating RN: Phillis Haggis Primary Care Provider: Sherrie Mustache Other Clinician: Referring Provider: DASHER, DAVID Treating Provider/Extender: Kathreen Cosier in Treatment: 0 Debridement Performed for Wound #4 Right,Medial Malleolus Assessment: Performed By: Physician Bonnell Public, NP Debridement Type: Debridement Severity of Tissue Pre Fat layer exposed Debridement: Pre-procedure Verification/Time Yes - 10:45 Out Taken: Start Time: 10:45 Pain Control: Lidocaine 4% Topical Solution Total Area Debrided (L x W): 5.5 (cm) x 4.2 (cm) = 23.1 (cm) Tissue and other material Viable, Non-Viable, Skin: Dermis , Fibrin/Exudate debrided: Level: Skin/Dermis Debridement Description: Selective/Open Wound Instrument: Curette Bleeding: Minimum Hemostasis Achieved: Pressure End Time: 10:49 Procedural Pain: 0 Post Procedural Pain: 0 Response to Treatment: Procedure was tolerated well Level of Consciousness: Awake and Alert Post Debridement Measurements of Total  Wound Length: (cm) 5.5 Width: (cm) 4.2 Depth: (cm) 0.3 Volume: (cm) 5.443 Character of Wound/Ulcer Post Debridement: Requires Further Debridement Severity of Tissue Post Debridement: Fat layer exposed Post Procedure Diagnosis Same as Pre-procedure Electronic Signature(s) Signed: 05/12/2018 12:18:38 PM By: Bonnell Public Signed: 05/12/2018 3:52:40 PM By: Alejandro Mulling Entered By: Bonnell Public on 05/12/2018 12:18:38 Hagerman, [name] ([id number]) -------------------------------------------------------------------------------- Debridement Details Patient Name: Marcia [name] Date of Service: 05/12/2018 9:45 AM Medical Record Number: [id number] Patient Account Number: [id number] Date of Birth/Sex: 1946/09/01 (72 y.o. F) Treating RN: Phillis Haggis Primary Care Provider: Sherrie Mustache Other Clinician: Referring Provider: DASHER, DAVID Treating Provider/Extender: Kathreen Cosier in Treatment: 0 Debridement Performed for Wound #3 Right,Posterior Lower Leg Assessment: Performed By: Physician Bonnell Public, NP Debridement Type: Debridement Severity of Tissue Pre Fat layer exposed Debridement: Pre-procedure Verification/Time Yes - 10:45 Out Taken: Start Time: 10:49 Pain Control: Lidocaine 4% Topical Solution Total Area Debrided (L x W): 1.5 (cm) x 0.9 (cm) = 1.35 (cm) Tissue and other material Viable, Non-Viable, Skin: Dermis , Fibrin/Exudate debrided: Level: Skin/Dermis Debridement Description: Selective/Open Wound Instrument: Curette Bleeding: Minimum Hemostasis Achieved: Pressure End Time: 10:51 Procedural Pain: 0 Post Procedural Pain: 0 Response to Treatment: Procedure was tolerated well Level of Consciousness: Awake and Alert Post Debridement Measurements of Total Wound Length: (cm) 1.5 Width: (cm) 0.9 Depth: (cm) 0.2 Volume: (cm) 0.212 Character of Wound/Ulcer Post Debridement: Requires Further Debridement Severity of Tissue Post Debridement: Fat layer  exposed Post Procedure Diagnosis Same as Pre-procedure Electronic Signature(s) Signed: 05/12/2018 12:18:52 PM By: Bonnell Public Signed: 05/12/2018 3:52:40 PM By: Alejandro Mulling Entered By: Bonnell Public on 05/12/2018 12:18:51 [name], [name] ([id number]) -------------------------------------------------------------------------------- HPI Details Patient Name: Marcia [name] Date of Service: 05/12/2018 9:45 AM Medical Record Number: [id number] Patient Account Number: [id number] Date of Birth/Sex: 04/18/1946 (72 y.o. F) Treating RN: Phillis Haggis Primary Care Provider: Sherrie Mustache Other Clinician: Referring Provider: Adolphus Birchwood, DAVID Treating Provider/Extender: Kathreen Cosier in Treatment: 0 History of Present Illness HPI Description: 05/12/18-She is seen in initial evaluation for multiple  open areas to the right lower extremity. She was recently hospitalized (7/30-8/2) with cellulitis of the right leg, failed outpatient therapy with Keflex. While hospitalized she was treated with IV vancomycin and cefepime; right foot x-ray was negative for osteomyelitis. She admits to intermittent pain, improved since hospitalization. She was discharged on doxycycline which she should complete with the next 48 hours. 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 She has a remote history of following with vascular medicine for venous reflux, she does not tolerate wearing compression stockings d/t skin sensitivities and "burning" sensation. In office ABI are elevated but she has strongly palpable pulses. We will initiate hydrofera blue and ace wrap compression. She is a diabetic with A1c 6.9 while hospitalized. Electronic Signature(s) Signed: 05/19/2018 8:59:23 AM By: Bonnell Public Previous Signature: 05/12/2018 12:33:06 PM Version By: Bonnell Public Previous Signature: 05/12/2018 12:27:34 PM Version By: Bonnell Public Entered By: Bonnell Public on 05/19/2018 08:59:23 Touchette, [name] ([id number]) -------------------------------------------------------------------------------- Physician Orders Details Patient Name: Marcia [name] Date of Service: 05/12/2018 9:45 AM Medical Record Number: [id number] Patient Account Number: [id number] Date of Birth/Sex: Jun 06, 1946 (72 y.o. F) Treating RN: Phillis Haggis Primary Care Provider: Sherrie Mustache Other Clinician: Referring Provider: Adolphus Birchwood, DAVID Treating Provider/Extender: Kathreen Cosier in Treatment: 0 Verbal / Phone Orders: Yes Clinician: Pinkerton, Debi Read Back and Verified: Yes Diagnosis Coding Wound Cleansing Wound #2 Right,Lateral Lower Leg o Clean wound with Normal Saline. o Cleanse wound with mild soap and water o May Shower, gently pat wound dry prior to applying new dressing. Wound #3 Right,Posterior Lower Leg o Clean wound with Normal Saline. o Cleanse wound with mild soap and water o May Shower, gently pat wound dry prior to applying new dressing. Wound #4 Right,Medial Malleolus o Clean wound with Normal Saline. o Cleanse wound with mild soap and water o May Shower, gently pat wound dry prior to applying new dressing. Anesthetic (add to Medication List) Wound #2 Right,Lateral Lower Leg o Topical Lidocaine 4% cream applied to wound bed prior to debridement (In Clinic Only). Wound #3 Right,Posterior Lower Leg o Topical Lidocaine 4% cream applied to wound bed prior to debridement (In Clinic Only). Wound #4 Right,Medial Malleolus o Topical Lidocaine 4% cream applied to wound bed prior to debridement (In Clinic Only). Primary Wound Dressing Wound #2 Right,Lateral Lower Leg o Hydrafera Blue Ready Transfer Wound #3 Right,Posterior Lower Leg o Hydrafera Blue Ready Transfer Wound #4 Right,Medial Malleolus o Hydrafera Blue Ready Transfer Secondary Dressing Wound #2 Right,Lateral Lower Leg o ABD  pad o Conform/Kerlix Wound #3 Right,Posterior Lower Leg o ABD pad o Conform/Kerlix Schreiter, [name] ([id number]) Wound #4 Right,Medial Malleolus o ABD pad o Conform/Kerlix Dressing Change Frequency Wound #2 Right,Lateral Lower Leg o Change dressing every other day. Wound #3 Right,Posterior Lower Leg o Change dressing every other day. Wound #4 Right,Medial Malleolus o Change dressing every other day. Follow-up Appointments Wound #2 Right,Lateral Lower Leg o Return Appointment in 1 week. Wound #3 Right,Posterior Lower Leg o Return Appointment in 1 week. Wound #4 Right,Medial Malleolus o Return Appointment in 1 week. Edema Control Wound #2 Right,Lateral Lower Leg o Other: - ace wrap Wound #3 Right,Posterior Lower Leg o Other: - ace wrap Wound #4 Right,Medial Malleolus o Other: - ace wrap Additional Orders / Instructions Wound #2 Right,Lateral Lower Leg o Increase protein intake. Wound #3 Right,Posterior Lower Leg o Increase protein intake. Wound #4 Right,Medial Malleolus o Increase  protein intake. Patient Medications Allergies: No Known Allergies Notifications Medication Indication Start End lidocaine DOSE 1 - topical 4 % cream - 1 cream topical ammonium lactate 05/12/2018 DOSE topical 12 % lotion - lotion topical; apply 1-2 times to legs Stead, Treonna A. ([id number]) Electronic Signature(s) Signed: 05/12/2018 12:11:02 PM By: Bonnell Public Entered By: Bonnell Public on 05/12/2018 12:11:02 Marcoux, [name] ([id number]) -------------------------------------------------------------------------------- Prescription 05/12/2018 Patient Name: Marcia [name] Provider: Bonnell Public NP Date of Birth: 1946-05-11 NPI#: [id number] Sex: F DEA#: [id number] Phone #: [phone] License #: Patient Address: [facility] [address] [place] [facility] [place], [place] [address] [address] [place], [place] [address] [phone] Allergies No Known Allergies Medication Medication: [name]: Strength: Form: lidocaine 4 % topical cream topical 4% cream Class: TOPICAL LOCAL ANESTHETICS Dose: Frequency / Time: Indication: 1 1 cream topical Number of Refills: Number of Units: 0 Generic Substitution: Start Date: End Date: One Time Use: Substitution Permitted No Note to Pharmacy: Signature(s): Date(s): Electronic Signature(s) Signed: 05/12/2018 3:45:39 PM By: Bonnell Public Entered By: Bonnell Public on 05/12/2018 12:11:03 Delaine, [name] ([id number]) --------------------------------------------------------------------------------  Problem List Details Patient Name: Marcia [name] Date of Service: 05/12/2018 9:45 AM Medical Record Number: [id number] Patient Account Number: [id number] Date of Birth/Sex: 1946/04/24 (72 y.o. 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 05/12/2018 No Yes L97.212 Non-pressure chronic ulcer of right calf with fat layer exposed 05/12/2018 No Yes L97.312 Non-pressure chronic ulcer of right ankle with fat layer 05/12/2018 No Yes exposed E11.622 Type 2 diabetes mellitus with other skin ulcer 05/12/2018 No Yes Inactive Problems Resolved Problems Electronic Signature(s) Signed: 05/12/2018 12:28:27 PM By: Bonnell Public Previous Signature: 05/12/2018 12:14:03 PM Version By: Bonnell Public Entered By: Bonnell Public on 05/12/2018 12:28:27 Hoaglund, [name] ([id number]) -------------------------------------------------------------------------------- Progress Note Details Patient Name: Marcia [name] Date of Service: 05/12/2018 9:45 AM Medical Record Number: [id number] Patient Account Number: [id number] Date of  Birth/Sex: 1946-06-02 (72 y.o. F) Treating RN: Phillis Haggis Primary Care Provider: Sherrie Mustache Other Clinician: Referring Provider: DASHER, DAVID Treating Provider/Extender: Kathreen Cosier in Treatment: 0 Subjective Chief Complaint Information obtained from Patient RLE wounds History of Present Illness (HPI) 05/12/18-She is seen in initial evaluation for multiple open areas to the right lower extremity. She was recently hospitalized (7/30-8/2) with cellulitis of the right leg, failed outpatient therapy with Keflex. While hospitalized she was treated with IV vancomycin and cefepime; right foot x-ray was negative for osteomyelitis. She admits to intermittent pain, improved since hospitalization. She was discharged on doxycycline which she should complete with the next 48 hours. 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 She has a remote history of following with vascular medicine for venous reflux, she does not tolerate wearing compression stockings d/t skin sensitivities and "burning" sensation. In office ABI are elevated but she has strongly palpable pulses. We will initiate hydrofera blue and ace wrap compression. She is a diabetic with A1c 6.9 while hospitalized. Wound History Patient presents with 5 open wounds that have been present for approximately 6. Patient has been treating wounds in the following manner: abx. Laboratory tests have been performed in the last month. Patient reportedly has not tested positive for an antibiotic resistant organism. Patient  reportedly has not tested positive for osteomyelitis. Patient reportedly has had testing performed to evaluate circulation in the legs. Patient History Information obtained from Patient. Allergies No Known Allergies Family History Cancer - Siblings, Diabetes - Mother,Father,Siblings, Heart Disease - Father, Hypertension - Father,  Stroke - Father,Mother, No family history of Kidney Disease, Lung Disease, Seizures, Thyroid Problems, Tuberculosis. Social History Former smoker - 10-15 years quit, Marital Status - Married, Alcohol Use - Never, Drug Use - No History, Caffeine Use - Daily. Medical And Surgical History Notes Constitutional Symptoms (General Health) vitamin d deficiency Respiratory nodule of lung Cardiovascular dyspnea 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, [place] Musculoskeletal arthritis Seidman, [name] ([id number]) Review of Systems (ROS) Eyes Complains or has symptoms of Glasses / Contacts - glasses. Denies complaints or symptoms of Dry Eyes, Vision Changes. Ear/Nose/Mouth/Throat Denies complaints or symptoms of Difficult clearing ears, Sinusitis. Hematologic/Lymphatic Denies complaints or symptoms of Bleeding / Clotting Disorders, Human Immunodeficiency Virus. Respiratory Denies complaints or symptoms of Chronic or frequent coughs, Shortness of Breath. Cardiovascular Complains or has symptoms of LE edema. Denies complaints or symptoms of Chest pain. Gastrointestinal Denies complaints or symptoms of Frequent diarrhea, Nausea, Vomiting. Endocrine Denies complaints or symptoms of Hepatitis, Thyroid disease, Polydypsia (Excessive Thirst). Genitourinary Denies complaints or symptoms of Kidney failure/ Dialysis, Incontinence/dribbling. Immunological Complains or has symptoms of Itching - cream. Denies complaints or symptoms of Hives. Integumentary (Skin) Complains or has symptoms of Breakdown - rt foot, Swelling - bilat. Musculoskeletal Denies complaints or symptoms of Muscle Pain, Muscle Weakness. Neurologic Complains or has symptoms of Numbness/parasthesias - LEB. Denies complaints or symptoms of Focal/Weakness. Oncologic The patient has no complaints or symptoms. Psychiatric Denies complaints or symptoms of Anxiety, Claustrophobia. Objective Constitutional Vitals Time Taken: 9:50  AM, Height: 70 in, Source: Stated, Weight: 193 lbs, BMI: 27.7, Temperature: 97.9 F, Pulse: 67 bpm, Respiratory Rate: 16 breaths/min, Blood Pressure: 150/55 mmHg. Integumentary (Hair, Skin) Wound #2 status is Open. Original cause of wound was Gradually Appeared. The wound is located on the Right,Lateral Lower Leg. The wound measures 0.3cm length x 0.2cm width x 0.1cm depth; 0.047cm^2 area and 0.005cm^3 volume. There is no tunneling or undermining noted. There is a none present amount of drainage noted. There is large (67-100%) red granulation within the wound bed. There is no necrotic tissue within the wound bed. The periwound skin appearance had no abnormalities noted for texture. The periwound skin appearance exhibited: Hemosiderin Staining. Periwound temperature was noted as No Abnormality. Wound #3 status is Open. Original cause of wound was Gradually Appeared. The wound is located on the Right,Posterior Lower Leg. The wound measures 1.5cm length x 0.9cm width x 0.1cm depth; 1.06cm^2 area and 0.106cm^3 volume. There Vi, [name] ([id number]) is Fat Layer (Subcutaneous Tissue) Exposed exposed. There is no tunneling or undermining noted. There is a medium amount of serous drainage noted. The wound margin is flat and intact. There is no granulation within the wound bed. There is a large (67-100%) amount of necrotic tissue within the wound bed including Adherent Slough. The periwound skin appearance exhibited: Hemosiderin Staining. The periwound skin appearance did not exhibit: Callus, Crepitus, Excoriation, Induration, Rash, Scarring, Dry/Scaly, Maceration, Atrophie Blanche, Cyanosis, Ecchymosis, Mottled, Pallor, Rubor, Erythema. Wound #4 status is Open. Original cause of wound was Gradually Appeared. The wound is located on the Right,Medial Malleolus. The wound measures 5.5cm length x 4.2cm width x 0.2cm depth; 18.143cm^2 area and 3.629cm^3 volume. There is Fat Layer (Subcutaneous Tissue)  Exposed exposed. There is no tunneling  or undermining noted. There is a medium amount of serous drainage noted. The wound margin is flat and intact. There is no granulation within the wound bed. There is a large (67-100%) amount of necrotic tissue within the wound bed including Adherent Slough. The periwound skin appearance exhibited: Induration, Erythema. The periwound skin appearance did not exhibit: Callus, Crepitus, Excoriation, Rash, Scarring, Dry/Scaly, Maceration, Atrophie Blanche, Cyanosis, Ecchymosis, Hemosiderin Staining, Mottled, Pallor, Rubor. The surrounding wound skin color is noted with erythema which is circumferential. 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 There was a Selective/Open Wound Skin/Dermis Debridement with a total area of 1.35 sq cm performed by Bonnell Public, NP. With the following instrument(s): Curette to remove Viable and Non-Viable tissue/material. Material removed includes Skin: Dermis and Fibrin/Exudate and after achieving pain control using Lidocaine 4% Topical Solution. No specimens were taken. A time out was conducted at 10:45, prior to the start of the procedure. A Minimum amount of bleeding was controlled with Pressure. The procedure was tolerated well with a pain level of 0 throughout and a pain level of 0 following the procedure. Patient s Level of Consciousness post procedure was recorded as Awake and Alert. Post Debridement Measurements: 1.5cm length x 0.9cm width x 0.2cm depth; 0.212cm^3 volume. Character of Wound/Ulcer Post Debridement requires further  debridement. Severity of Tissue Post Debridement is: Fat layer exposed. Post procedure Diagnosis Wound #3: Same as Pre-Procedure Wound #4 Pre-procedure diagnosis of Wound #4 is a Diabetic Wound/Ulcer of the Lower Extremity located on the Right,Medial Malleolus .Severity of Tissue Pre Debridement is: Fat layer exposed. There was a Selective/Open Wound Skin/Dermis Debridement with a total area of 23.1 sq cm performed by Bonnell Public, NP. With the following instrument(s): Curette to remove Viable and Non-Viable tissue/material. Material removed includes Skin: Dermis and Fibrin/Exudate and after achieving pain control using Lidocaine 4% Topical Solution. No specimens were taken. A time out was conducted at 10:45, prior to the start of the procedure. A Minimum amount of bleeding was controlled with Pressure. The procedure was tolerated Correia, [name] ([id number]) well with a pain level of 0 throughout and a pain level of 0 following the procedure. Patient s Level of Consciousness post procedure was recorded as Awake and Alert. Post Debridement Measurements: 5.5cm length x 4.2cm width x 0.3cm depth; 5.443cm^3 volume. Character of Wound/Ulcer Post Debridement requires further debridement. Severity of Tissue Post Debridement is: Fat layer exposed. Post procedure Diagnosis Wound #4: Same as Pre-Procedure Plan Wound Cleansing: Wound #2 Right,Lateral Lower Leg: Clean wound with Normal Saline. Cleanse wound with mild soap and water May Shower, gently pat wound dry prior to applying new dressing. Wound #3 Right,Posterior Lower Leg: Clean wound with Normal Saline. Cleanse wound with mild soap and water May Shower, gently pat wound dry prior to applying new dressing. Wound #4 Right,Medial Malleolus: Clean wound with Normal Saline. Cleanse wound with mild soap and water May Shower, gently pat wound dry prior to applying new dressing. Anesthetic (add to Medication List): Wound #2 Right,Lateral Lower  Leg: Topical Lidocaine 4% cream applied to wound bed prior to debridement (In Clinic Only). Wound #3 Right,Posterior Lower Leg: Topical Lidocaine 4% cream applied to  wound bed prior to debridement (In Clinic Only). Wound #4 Right,Medial Malleolus: Topical Lidocaine 4% cream applied to wound bed prior to debridement (In Clinic Only). Primary Wound Dressing: Wound #2 Right,Lateral Lower Leg: Hydrafera Blue Ready Transfer Wound #3 Right,Posterior Lower Leg: Hydrafera Blue Ready Transfer Wound #4 Right,Medial Malleolus: Hydrafera Blue Ready Transfer Secondary Dressing: Wound #2 Right,Lateral Lower Leg: ABD pad Conform/Kerlix Wound #3 Right,Posterior Lower Leg: ABD pad Conform/Kerlix Wound #4 Right,Medial Malleolus: ABD pad Conform/Kerlix Dressing Change Frequency: Wound #2 Right,Lateral Lower Leg: Change dressing every other day. Wound #3 Right,Posterior Lower Leg: Change dressing every other day. Wound #4 Right,Medial Malleolus: Change dressing every other day. Follow-up Appointments: [name], [name] ([id number]) Wound #2 Right,Lateral Lower Leg: Return Appointment in 1 week. Wound #3 Right,Posterior Lower Leg: Return Appointment in 1 week. Wound #4 Right,Medial Malleolus: Return Appointment in 1 week. Edema Control: Wound #2 Right,Lateral Lower Leg: Other: - ace wrap Wound #3 Right,Posterior Lower Leg: Other: - ace wrap Wound #4 Right,Medial Malleolus: Other: - ace wrap Additional Orders / Instructions: Wound #2 Right,Lateral Lower Leg: Increase protein intake. Wound #3 Right,Posterior Lower Leg: Increase protein intake. Wound #4 Right,Medial Malleolus: Increase protein intake. The following medication(s) was prescribed: lidocaine topical 4 % cream 1 1 cream topical was prescribed at facility ammonium lactate topical 12 % lotion lotion topical; apply 1-2 times to legs starting 05/12/2018 Electronic Signature(s) Signed: 05/19/2018 8:59:40 AM By: Bonnell Public Previous  Signature: 05/12/2018 5:55:17 PM Version By: Bonnell Public Previous Signature: 05/12/2018 12:33:22 PM Version By: Bonnell Public Previous Signature: 05/12/2018 12:27:47 PM Version By: Bonnell Public Entered By: Bonnell Public on 05/19/2018 08:59:40 Copelin, [name] ([id number]) -------------------------------------------------------------------------------- ROS/PFSH Details Patient Name: Marcia [name] Date of Service: 05/12/2018 9:45 AM Medical Record Number: [id number] Patient Account Number: [id number] Date of Birth/Sex: [birth year] (72 y.o. F) Treating RN: Rema Jasmine Primary Care Provider: Sherrie Mustache Other Clinician: Referring Provider: DASHER, DAVID Treating Provider/Extender: Kathreen Cosier in Treatment: 0 Information Obtained From Patient Wound History Do you currently have one or more open woundso Yes How many open wounds do you currently haveo 5 Approximately how long have you had your woundso 6 How have you been treating your wound(s) until nowo abx Has your wound(s) ever healed and then re-openedo No Have you had any lab work done in the past montho Yes Who ordered the lab work doneo hospital Have you tested positive for an antibiotic resistant organism (MRSA, VRE)o No Have you tested positive for osteomyelitis (bone infection)o No Have you had any tests for circulation on your legso Yes Eyes Complaints and Symptoms: Positive for: Glasses / Contacts - glasses Negative for: Dry Eyes; Vision Changes Medical History: Negative for: Cataracts; Glaucoma; Optic Neuritis Ear/Nose/Mouth/Throat Complaints and Symptoms: Negative for: Difficult clearing ears; Sinusitis Medical History: Negative for: Chronic sinus problems/congestion; Middle ear problems Hematologic/Lymphatic Complaints and Symptoms: Negative for: Bleeding / Clotting Disorders; Human Immunodeficiency Virus Medical History: Negative for: Anemia; Hemophilia; Human Immunodeficiency Virus; Lymphedema; Sickle Cell  Disease Respiratory Complaints and Symptoms: Negative for: Chronic or frequent coughs; Shortness of Breath Medical History: Negative for: Aspiration; Asthma; Chronic Obstructive Pulmonary Disease (COPD); Pneumothorax; Sleep Apnea; Tuberculosis Past Medical History Notes: [name], [name] ([id number]) nodule of lung Cardiovascular Complaints and Symptoms: Positive for: LE edema Negative for: Chest pain Medical History: Positive for: Hypertension Negative for: Angina; Arrhythmia; Congestive Heart Failure; Coronary Artery Disease; Deep Vein Thrombosis; Hypotension; Myocardial Infarction; Peripheral Arterial Disease; Peripheral Venous Disease; Phlebitis; Vasculitis Past Medical History Notes: dyspnea hyperlipedema had vascular work  up in 2011, AVV Gastrointestinal Complaints and Symptoms: Negative for: Frequent diarrhea; Nausea; Vomiting Medical History: Negative for: Cirrhosis ; Colitis; Crohnos; Hepatitis A; Hepatitis B; Hepatitis C Endocrine Complaints and Symptoms: Negative for: Hepatitis; Thyroid disease; Polydypsia (Excessive Thirst) Medical History: Positive for: Type II Diabetes Time with diabetes: 10 years Treated with: Oral agents Blood sugar tested every day: Yes Tested : every mornimg Genitourinary Complaints and Symptoms: Negative for: Kidney failure/ Dialysis; Incontinence/dribbling Medical History: Negative for: End Stage Renal Disease Immunological Complaints and Symptoms: Positive for: Itching - cream Negative for: Hives Medical History: Negative for: Lupus Erythematosus; Raynaudos; Scleroderma Integumentary (Skin) Complaints and Symptoms: Positive for: Breakdown - rt foot; Swelling - bilat Medical History: [name], [name] ([id number]) Negative for: History of Burn; History of pressure wounds Musculoskeletal Complaints and Symptoms: Negative for: Muscle Pain; Muscle Weakness Medical History: Negative for: Gout; Rheumatoid Arthritis; Osteoarthritis;  Osteomyelitis Past Medical History Notes: arthritis Neurologic Complaints and Symptoms: Positive for: Numbness/parasthesias - LEB Negative for: Focal/Weakness Medical History: Positive for: Neuropathy - peripheral Negative for: Dementia; Quadriplegia; Paraplegia; Seizure Disorder Psychiatric Complaints and Symptoms: Negative for: Anxiety; Claustrophobia Medical History: Negative for: Anorexia/bulimia; Confinement Anxiety Constitutional Symptoms (General Health) Medical History: Past Medical History Notes: vitamin d deficiency Oncologic Complaints and Symptoms: No Complaints or Symptoms Medical History: Negative for: Received Chemotherapy; Received Radiation Immunizations Pneumococcal Vaccine: Received Pneumococcal Vaccination: Yes Tetanus Vaccine: Last tetanus shot: 05/12/2014 Immunization Notes: plan on flu shot Implantable Devices Family and Social History Cancer: Yes - Siblings; Diabetes: Yes - Mother,Father,Siblings; Heart Disease: Yes - Father; Hypertension: Yes - Father; Kidney Disease: No; Lung Disease: No; Seizures: No; Stroke: Yes - Father,Mother; Thyroid Problems: No; Tuberculosis: No; Former smoker - 10-15 years quit; Marital Status - Married; Alcohol Use: Never; Drug Use: No History; Caffeine Use: Daily; Financial Concerns: No; Food, Clothing or Shelter Needs: No; Support System Lacking: No; Transportation Concerns: No; [name], [name] ([id number]) Advanced Directives: No; Patient does not want information on Advanced Directives; Living Will: No; Medical Power of Attorney: No Electronic Signature(s) Signed: 05/12/2018 11:39:26 AM By: Rema Jasmine Signed: 05/12/2018 3:45:39 PM By: Bonnell Public Entered By: Rema Jasmine on 05/12/2018 10:11:18 Cowie, Keasha AMarland Kitchen ([id number]) -------------------------------------------------------------------------------- SuperBill Details Patient Name: Marcia [name] Date of Service: 05/12/2018 Medical Record Number: [id number] Patient Account  Number: [id number] Date of Birth/Sex: August 07, 1946 (72 y.o. 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 Facility Procedures CPT4 Code: 11031594 Description: 99213 - WOUND CARE VISIT-LEV 3 EST PT Modifier: Quantity: 1 CPT4 Code: [id number] Description: 97597 - DEBRIDE WOUND 1ST 20 SQ CM OR < ICD-10 Diagnosis Description L97.212 Non-pressure chronic ulcer of right calf with fat layer expo L97.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-pressure chronic ulcer of right ankle with fat layer exp Modifier: sed osed Quantity: 1 CPT4 Code: [id number] Description: 97598 - DEBRIDE WOUND EA ADDL 20 SQ CM ICD-10 Diagnosis Description L97.312 Non-pressure chronic ulcer of right ankle with fat layer exp L97.212 Non-pressure chronic ulcer of right calf with fat layer expo Modifier: osed sed Quantity: 1 Physician Procedures CPT4 Code: [id number] Description: 97597 - WC PHYS DEBR WO ANESTH 20 SQ CM ICD-10 Diagnosis Description L97.212 Non-pressure chronic ulcer of right calf with fat layer expo L97.312 Non-pressure chronic ulcer of right ankle with fat layer exp  Modifier: sed osed Quantity: 1 CPT4 Code: [id number] Description: 97598 - WC PHYS DEBR WO ANESTH EA ADD 20 CM ICD-10 Diagnosis Description L97.312 Non-pressure chronic ulcer of right ankle with fat layer exp L97.212 Non-pressure chronic ulcer of right calf with fat layer expo Modifier: osed sed Quantity: 1 Electronic Signature(s) Signed: 05/12/2018 12:28:04 PM By: Bonnell Public Entered By: Bonnell Public on 05/12/2018 12:28:04

## 2018-05-25 NOTE — Progress Notes (Signed)
PINK, TENENBAUM ([id number]) Visit Report for 05/12/2018 Allergy List Details Patient Name: MINIYA, DUFFORD A. Date of Service: 05/12/2018 9:45 AM Medical Record Number: [id number] Patient Account Number: [id number] Date of Birth/Sex: 1946/01/31 (72 y.o. F) Treating RN: Rema Jasmine Primary Care Marlisha Vanwyk: Sherrie Mustache Other Clinician: Referring Lawton Dollinger: Adolphus Birchwood, DAVID Treating Karen Huhta/Extender: Bonnell Public Weeks in Treatment: 0 Allergies Active Allergies No Known Allergies Allergy Notes Electronic Signature(s) Signed: 05/12/2018 11:39:26 AM By: Rema Jasmine Entered By: Rema Jasmine on 05/12/2018 09:59:15 Cheek, [name] ([id number]) -------------------------------------------------------------------------------- Arrival Information Details Patient Name: [name] Date of Service: 05/12/2018 9:45 AM Medical Record Number: [id number] Patient Account Number: [id number] Date of Birth/Sex: May 12, 1946 (72 y.o. F) Treating RN: Rema Jasmine Primary Care Wilburta Milbourn: Sherrie Mustache Other Clinician: Referring Dusan Lipford: DASHER, DAVID Treating Wafaa Deemer/Extender: Kathreen Cosier in Treatment: 0 Visit Information Patient Arrived: Ambulatory Arrival Time: 09:52 Accompanied By: self Transfer Assistance: None Patient Identification Verified: Yes Secondary Verification Process Completed: Yes History Since Last Visit Added or deleted any medications: No Any new allergies or adverse reactions: No Had a fall or experienced change in activities of daily living that may affect risk of falls: No Signs or symptoms of abuse/neglect since last visito No Hospitalized since last visit: No Implantable device outside of the clinic excluding cellular tissue based products placed in the center since last visit: No Has Dressing in Place as Prescribed: No Electronic Signature(s) Signed: 05/12/2018 11:39:26 AM By: Rema Jasmine Entered By: Rema Jasmine on 05/12/2018 09:53:14 Peddie, Rodnisha AMarland Kitchen  ([id number]) -------------------------------------------------------------------------------- Clinic Level of Care Assessment Details Patient Name: [name] Date of Service: 05/12/2018 9:45 AM Medical Record Number: [id number] Patient Account Number: [id number] Date of Birth/Sex: January 29, 1946 (72 y.o. F) Treating RN: Phillis Haggis Primary Care Ceciley Buist: Sherrie Mustache Other Clinician: Referring Radonna Bracher: Adolphus Birchwood, DAVID Treating Jaecion Dempster/Extender: Kathreen Cosier in Treatment: 0 Clinic Level of Care Assessment Items TOOL 1 Quantity Score X - Use when EandM and Procedure is performed on INITIAL visit 1 0 ASSESSMENTS - Nursing Assessment / Reassessment X - General Physical Exam (combine w/ comprehensive assessment (listed just below) when 1 20 performed on new pt. evals) X- 1 25 Comprehensive Assessment (HX, ROS, Risk Assessments, Wounds Hx, etc.) ASSESSMENTS - Wound and Skin Assessment / Reassessment []  - Dermatologic / Skin Assessment (not related to wound area) 0 ASSESSMENTS - Ostomy and/or Continence Assessment and Care []  - Incontinence Assessment and Management 0 []  - 0 Ostomy Care Assessment and Management (repouching, etc.) PROCESS - Coordination of Care X - Simple Patient / Family Education for ongoing care 1 15 []  - 0 Complex (extensive) Patient / Family Education for ongoing care []  - 0 Staff obtains Chiropractor, Records, Test Results / Process Orders []  - 0 Staff telephones HHA, Nursing Homes / Clarify orders / etc []  - 0 Routine Transfer to another Facility (non-emergent condition) []  - 0 Routine Hospital Admission (non-emergent condition) X- 1 15 New Admissions / Manufacturing engineer / Ordering NPWT, Apligraf, etc. []  - 0 Emergency Hospital Admission (emergent condition) PROCESS - Special Needs []  - Pediatric / Minor Patient Management 0 []  - 0 Isolation Patient Management []  - 0 Hearing / Language / Visual special needs []  - 0 Assessment of Community  assistance (transportation, D/C planning, etc.) []  - 0 Additional assistance / Altered mentation []  - 0 Support Surface(s) Assessment (bed, cushion, seat, etc.) Vezina, Lynnett A. ([id number]) INTERVENTIONS - Miscellaneous []  - External ear exam 0 []  - 0 Patient Transfer (multiple staff / Nurse, adult / Similar devices) []  -  0 Simple Staple / Suture removal (25 or less) []  - 0 Complex Staple / Suture removal (26 or more) []  - 0 Hypo/Hyperglycemic Management (do not check if billed separately) X- 1 15 Ankle / Brachial Index (ABI) - do not check if billed separately Has the patient been seen at the hospital within the last three years: Yes Total Score: 90 Level Of Care: New/Established - Level 3 Electronic Signature(s) Signed: 05/12/2018 3:52:40 PM By: Alejandro Mulling Entered By: Alejandro Mulling on 05/12/2018 11:18:31 Lewelling, [name] ([id number]) -------------------------------------------------------------------------------- Encounter Discharge Information Details Patient Name: [name] Date of Service: 05/12/2018 9:45 AM Medical Record Number: [id number] Patient Account Number: [id number] Date of Birth/Sex: 1946/05/22 (72 y.o. F) Treating RN: Renne Crigler Primary Care Cruzita Lipa: Sherrie Mustache Other Clinician: Referring Ishanvi Mcquitty: Adolphus Birchwood, DAVID Treating Larena Ohnemus/Extender: Kathreen Cosier in Treatment: 0 Encounter Discharge Information Items Discharge Condition: Stable Ambulatory Status: Ambulatory Discharge Destination: Home Transportation: Private Auto Schedule Follow-up Appointment: Yes Clinical Summary of Care: Electronic Signature(s) Signed: 05/12/2018 3:57:43 PM By: Renne Crigler Entered By: Renne Crigler on 05/12/2018 11:10:38 Wester, Louan AMarland Kitchen ([id number]) -------------------------------------------------------------------------------- Lower Extremity Assessment Details Patient Name: [name] Date of Service: 05/12/2018 9:45 AM Medical Record Number:  [id number] Patient Account Number: [id number] Date of Birth/Sex: [birth year] (72 y.o. F) Treating RN: Rema Jasmine Primary Care Montzerrat Brunell: Sherrie Mustache Other Clinician: Referring Teruo Stilley: DASHER, DAVID Treating Anhelica Fowers/Extender: Kathreen Cosier in Treatment: 0 Edema Assessment Assessed: [Left: No] [Right: No] [Left: Edema] [Right: :] Calf Left: Right: Point of Measurement: 34 cm From Medial Instep 41 cm 42.6 cm Ankle Left: Right: Point of Measurement: 12 cm From Medial Instep 21.3 cm 22 cm Vascular Assessment Claudication: Claudication Assessment [Left:None] [Right:None] Pulses: Dorsalis Pedis Palpable: [Left:Yes] [Right:Yes] Doppler Audible: [Left:Yes] [Right:Yes] Posterior Tibial Palpable: [Left:Yes] [Right:Yes] Doppler Audible: [Left:Yes] [Right:Yes] Popliteal Palpable: [Left:Yes] [Right:Yes] Doppler Audible: [Left:Yes] [Right:Yes] Extremity colors, hair growth, and conditions: Extremity Color: [Left:Normal] [Right:Normal] Hair Growth on Extremity: [Left:Yes] [Right:Yes] Temperature of Extremity: [Left:Warm] [Right:Warm] Capillary Refill: [Left:< 3 seconds] [Right:< 3 seconds] Blood Pressure: Brachial: [Left:138] [Right:140] Dorsalis Pedis: 152 [Left:Dorsalis Pedis: 180] Ankle: Posterior Tibial: 150 [Left:Posterior Tibial: 1.09] [Right:1.29] Toe Nail Assessment Left: Right: Thick: Yes Yes Discolored: No No Deformed: No No Improper Length and Hygiene: No Landrus, Danilynn A. ([id number]) Electronic Signature(s) Signed: 05/12/2018 11:39:26 AM By: Rema Jasmine Entered By: Rema Jasmine on 05/12/2018 10:30:09 Rocks, [name] ([id number]) -------------------------------------------------------------------------------- Multi Wound Chart Details Patient Name: [name] Date of Service: 05/12/2018 9:45 AM Medical Record Number: [id number] Patient Account Number: [id number] Date of Birth/Sex: 1946-03-10 (72 y.o. F) Treating RN: Phillis Haggis Primary Care Missie Gehrig: Sherrie Mustache Other Clinician: Referring Charleston Vierling: DASHER, DAVID Treating Ndea Kilroy/Extender: Kathreen Cosier in Treatment: 0 Vital Signs Height(in): 70 Pulse(bpm): 67 Weight(lbs): 193 Blood Pressure(mmHg): 150/55 Body Mass Index(BMI): 28 Temperature(F): 97.9 Respiratory Rate 16 (breaths/min): Photos: [2:No Photos] [3:No Photos] [4:No Photos] Wound Location: [2:Right Lower Leg - Medial] [3:Right Lower Leg - Posterior] [4:Right Malleolus - Medial] Wounding Event: [2:Gradually Appeared] [3:Gradually Appeared] [4:Gradually Appeared] 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] Comorbid History: [2:Hypertension, Type II Diabetes, Neuropathy] [3:Hypertension, Type II Diabetes, Neuropathy] [4:Hypertension, Type II Diabetes, Neuropathy] Date Acquired: [2:03/12/2018] [3:03/12/2018] [4:03/12/2018] Weeks of Treatment: [2:0] [3:0] [4:0] Wound Status: [2:Open] [3:Open] [4:Open] Clustered Wound: [2:No] [3:No] [4:Yes] Clustered Quantity: [2:N/A] [3:N/A] [4:5] Measurements L x W x D [2:0.3x0.2x0.1] [3:1.5x0.9x0.1] [4:5.5x4.2x0.2] (cm) Area (cm) : [2:0.047] [3:1.06] [4:18.143] Volume (cm) : [2:0.005] [3:0.106] [  4:3.629] % Reduction in Area: [2:0.00%] [3:N/A] [4:N/A] % Reduction in Volume: [2:0.00%] [3:N/A] [4:N/A] Classification: [2:Grade 1] [3:Grade 1] [4:Grade 1] Exudate Amount: [2:None Present] [3:N/A] [4:Medium] Exudate Type: [2:N/A] [3:N/A] [4:Serous] Exudate Color: [2:N/A] [3:N/A] [4:amber] Wound Margin: [2:N/A] [3:N/A] [4:Flat and Intact] Granulation Amount: [2:Large (67-100%)] [3:None Present (0%)] [4:None Present (0%)] Granulation Quality: [2:Red] [3:N/A] [4:N/A] Necrotic Amount: [2:None Present (0%)] [3:Large (67-100%)] [4:Large (67-100%)] Exposed Structures: [2:Fascia: No Fat Layer (Subcutaneous Tissue) Exposed: No Tendon: No Muscle: No Joint: No Bone: No] [3:Fat Layer  (Subcutaneous Tissue) Exposed: Yes Fascia: No Tendon: No Muscle: No Joint: No Bone: No] [4:Fat Layer (Subcutaneous Tissue) Exposed: Yes  Fascia: No Tendon: No Muscle: No Joint: No Bone: No] Periwound Skin Texture: [2:No Abnormalities Noted] [3:Excoriation: No Induration: No Callus: No Crepitus: No] [4:Induration: Yes Excoriation: No Callus: No Crepitus: No] Rash: No Rash: No Scarring: No Scarring: No Periwound Skin Moisture: No Abnormalities Noted Maceration: No Maceration: No Dry/Scaly: No Dry/Scaly: No Periwound Skin Color: Hemosiderin Staining: Yes Hemosiderin Staining: Yes Erythema: Yes Atrophie Blanche: No Atrophie Blanche: No Cyanosis: No Cyanosis: No Ecchymosis: No Ecchymosis: No Erythema: No Hemosiderin Staining: No Mottled: No Mottled: No Pallor: No Pallor: No Rubor: No Rubor: No Erythema Location: N/A N/A Circumferential Temperature: No Abnormality N/A N/A Tenderness on Palpation: No No No Wound Preparation: Ulcer Cleansing: Ulcer Cleansing: Ulcer Cleansing: Rinsed/Irrigated with Saline Rinsed/Irrigated with Saline Rinsed/Irrigated with Saline Topical Anesthetic Applied: Topical Anesthetic Applied: Topical Anesthetic Applied: Other: lidocain 4% Other: lidocaine 4% Other: lidocaine 4% Treatment Notes Electronic Signature(s) Signed: 05/12/2018 3:52:40 PM By: Alejandro Mulling Entered By: Alejandro Mulling on 05/12/2018 10:41:00 Halk, [name] ([id number]) -------------------------------------------------------------------------------- Multi-Disciplinary Care Plan Details Patient Name: [name] Date of Service: 05/12/2018 9:45 AM Medical Record Number: [id number] Patient Account Number: [id number] Date of Birth/Sex: Aug 03, 1946 (72 y.o. F) Treating RN: Phillis Haggis Primary Care Caileb Rhue: Sherrie Mustache Other Clinician: Referring Redith Drach: DASHER, DAVID Treating Kobe Jansma/Extender: Kathreen Cosier in Treatment: 0 Active Inactive ` Abuse / Safety /  Falls / Self Care Management Nursing Diagnoses: Potential for falls Goals: Patient will not experience any injury related to falls Date Initiated: 05/12/2018 Target Resolution Date: 08/15/2018 Goal Status: Active Interventions: Assess Activities of Daily Living upon admission and as needed Assess fall risk on admission and as needed Assess: immobility, friction, shearing, incontinence upon admission and as needed Assess impairment of mobility on admission and as needed per policy Assess personal safety and home safety (as indicated) on admission and as needed Notes: ` Nutrition Nursing Diagnoses: Imbalanced nutrition Impaired glucose control: actual or potential Goals: Patient/caregiver agrees to and verbalizes understanding of need to use nutritional supplements and/or vitamins as prescribed Date Initiated: 05/12/2018 Target Resolution Date: 09/12/2018 Goal Status: Active Patient/caregiver will maintain therapeutic glucose control Date Initiated: 05/12/2018 Target Resolution Date: 08/15/2018 Goal Status: Active Interventions: Assess patient nutrition upon admission and as needed per policy Provide education on elevated blood sugars and impact on wound healing Provide education on nutrition Notes: CASSADIE, PANKONIN ([id number]) Orientation to the Wound Care Program Nursing Diagnoses: Knowledge deficit related to the wound healing center program Goals: Patient/caregiver will verbalize understanding of the Wound Healing Center Program Date Initiated: 05/12/2018 Target Resolution Date: 06/13/2018 Goal Status: Active Interventions: Provide education on orientation to the wound center Notes: ` Wound/Skin Impairment Nursing Diagnoses: Impaired tissue integrity Knowledge deficit related to ulceration/compromised skin integrity Goals: Ulcer/skin breakdown will have a volume reduction of 80% by week 12 Date Initiated: 05/12/2018 Target Resolution Date: 09/05/2018 Goal Status:  Active  Interventions: Assess patient/caregiver ability to perform ulcer/skin care regimen upon admission and as needed Assess ulceration(s) every visit Notes: Electronic Signature(s) Signed: 05/12/2018 3:52:40 PM By: Alejandro Mulling Entered By: Alejandro Mulling on 05/12/2018 10:40:45 Kassim, Anhar AMarland Kitchen ([id number]) -------------------------------------------------------------------------------- Pain Assessment Details Patient Name: [name] Date of Service: 05/12/2018 9:45 AM Medical Record Number: [id number] Patient Account Number: [id number] Date of Birth/Sex: Jan 30, 1946 (72 y.o. F) Treating RN: Rema Jasmine Primary Care Taryll Reichenberger: Sherrie Mustache Other Clinician: Referring Langston Summerfield: Adolphus Birchwood, DAVID Treating Kimmie Doren/Extender: Kathreen Cosier in Treatment: 0 Active Problems Location of Pain Severity and Description of Pain Patient Has Paino No Site Locations Pain Management and Medication Current Pain Management: Goals for Pain Management pt denies pain Electronic Signature(s) Signed: 05/12/2018 11:39:26 AM By: Rema Jasmine Entered By: Rema Jasmine on 05/12/2018 09:54:22 Hengel, [name] ([id number]) -------------------------------------------------------------------------------- Patient/Caregiver Education Details Patient Name: [name] Date of Service: 05/12/2018 9:45 AM Medical Record Number: [id number] Patient Account Number: [id number] Date of Birth/Gender: 1946/05/21 (72 y.o. F) Treating RN: Renne Crigler Primary Care Physician: Sherrie Mustache Other Clinician: Referring Physician: Adolphus Birchwood, DAVID Treating Physician/Extender: Kathreen Cosier in Treatment: 0 Education Assessment Education Provided To: Patient Education Topics Provided Wound Debridement: Handouts: Wound Debridement Methods: Explain/Verbal Responses: State content correctly Wound/Skin Impairment: Handouts: Caring for Your Ulcer Methods: Explain/Verbal Responses: State content correctly Electronic  Signature(s) Signed: 05/12/2018 3:57:43 PM By: Renne Crigler Entered By: Renne Crigler on 05/12/2018 11:10:56 Ogletree, Indi AMarland Kitchen ([id number]) -------------------------------------------------------------------------------- Wound Assessment Details Patient Name: [name] Date of Service: 05/12/2018 9:45 AM Medical Record Number: [id number] Patient Account Number: [id number] Date of Birth/Sex: 06-Jan-1946 (72 y.o. F) Treating RN: Rema Jasmine Primary Care Dolton Shaker: Sherrie Mustache Other Clinician: Referring Tariq Pernell: DASHER, DAVID Treating Rebbecca Osuna/Extender: Kathreen Cosier in Treatment: 0 Wound Status Wound Number: 2 Primary Etiology: Diabetic Wound/Ulcer of the Lower Extremity Wound Location: Right Lower Leg - Medial Wound Status: Open Wounding Event: Gradually Appeared Comorbid Hypertension, Type II Diabetes, Date Acquired: 03/12/2018 History: Neuropathy Weeks Of Treatment: 0 Clustered Wound: No Photos Photo Uploaded By: Rema Jasmine on 05/12/2018 10:42:24 Wound Measurements Length: (cm) 0.3 % Reduc Width: (cm) 0.2 % Reduc Depth: (cm) 0.1 Tunneli Area: (cm) 0.047 Underm Volume: (cm) 0.005 tion in Area: 0% tion in Volume: 0% ng: No ining: No Wound Description Classification: Grade 1 Foul Od Exudate Amount: None Present Slough/ or After Cleansing: No Fibrino No Wound Bed Granulation Amount: Large (67-100%) Exposed Structure Granulation Quality: Red Fascia Exposed: No Necrotic Amount: None Present (0%) Fat Layer (Subcutaneous Tissue) Exposed: No Tendon Exposed: No Muscle Exposed: No Joint Exposed: No Bone Exposed: No Periwound Skin Texture Texture Color No Abnormalities Noted: Yes No Abnormalities Noted: No Mauch, Jeaneane A. ([id number]) Moisture Hemosiderin Staining: Yes No Abnormalities Noted: No Temperature / Pain Temperature: No Abnormality Wound Preparation Ulcer Cleansing: Rinsed/Irrigated with Saline Topical Anesthetic Applied: Other: lidocain  4%, Electronic Signature(s) Signed: 05/12/2018 11:39:26 AM By: Rema Jasmine Entered By: Rema Jasmine on 05/12/2018 10:19:34 Westerlund, Yeva AMarland Kitchen ([id number]) -------------------------------------------------------------------------------- Wound Assessment Details Patient Name: [name] Date of Service: 05/12/2018 9:45 AM Medical Record Number: [id number] Patient Account Number: [id number] Date of Birth/Sex: December 31, 1945 (72 y.o. F) Treating RN: Rema Jasmine Primary Care Neftali Thurow: Sherrie Mustache Other Clinician: Referring Colin Ellers: Adolphus Birchwood, DAVID Treating Miette Molenda/Extender: Kathreen Cosier in Treatment: 0 Wound Status Wound Number: 3 Primary Etiology: Vasculitis Wound Location: Right Lower Leg - Posterior Secondary Diabetic Wound/Ulcer of the Lower Etiology: Extremity Wounding Event: Gradually Appeared Wound Status: Open Date Acquired: 03/12/2018 Comorbid History: Hypertension, Type  II Diabetes, Weeks Of Treatment: 0 Neuropathy Clustered Wound: No Photos Wound Measurements Length: (cm) 1.5 Width: (cm) 0.9 Depth: (cm) 0.1 Area: (cm) 1.06 Volume: (cm) 0.106 % Reduction in Area: 0% % Reduction in Volume: 0% Tunneling: No Undermining: No Wound Description Full Thickness Without Exposed Support Classification: Structures Wound Margin: Flat and Intact Exudate Medium Amount: Exudate Type: Serous Exudate Color: amber Foul Odor After Cleansing: No Slough/Fibrino Yes Wound Bed Granulation Amount: None Present (0%) Exposed Structure Necrotic Amount: Large (67-100%) Fascia Exposed: No Necrotic Quality: Adherent Slough Fat Layer (Subcutaneous Tissue) Exposed: Yes Tendon Exposed: No Muscle Exposed: No Joint Exposed: No Bone Exposed: No Periwound Skin Texture Dardis, Reyanna A. ([id number]) Texture Color No Abnormalities Noted: No No Abnormalities Noted: No Callus: No Atrophie Blanche: No Crepitus: No Cyanosis: No Excoriation: No Ecchymosis: No Induration: No Erythema:  No Rash: No Hemosiderin Staining: Yes Scarring: No Mottled: No Pallor: No Moisture Rubor: No No Abnormalities Noted: No Dry / Scaly: No Maceration: No Wound Preparation Ulcer Cleansing: Rinsed/Irrigated with Saline Topical Anesthetic Applied: Other: lidocaine 4%, Treatment Notes Wound #3 (Right, Posterior Lower Leg) 1. Cleansed with: Clean wound with Normal Saline 2. Anesthetic Topical Lidocaine 4% cream to wound bed prior to debridement 3. Peri-wound Care: Moisturizing lotion 4. Dressing Applied: Hydrafera Blue 5. Secondary Dressing Applied ABD Pad Notes kerlix wrap and ace wrap on right lower leg Electronic Signature(s) Signed: 05/12/2018 4:31:52 PM By: Alejandro Mulling Signed: 05/14/2018 3:52:47 PM By: Rema Jasmine Previous Signature: 05/12/2018 3:25:58 PM Version By: Rema Jasmine Previous Signature: 05/12/2018 3:42:18 PM Version By: Elliot Gurney, BSN, RN, CWS, Kim RN, BSN Previous Signature: 05/12/2018 11:39:26 AM Version By: Rema Jasmine Entered By: Alejandro Mulling on 05/12/2018 16:31:51 Dicioccio, Shavelle AMarland Kitchen ([id number]) -------------------------------------------------------------------------------- Wound Assessment Details Patient Name: [name] Date of Service: 05/12/2018 9:45 AM Medical Record Number: [id number] Patient Account Number: [id number] Date of Birth/Sex: [birth year] (72 y.o. F) Treating RN: Rema Jasmine Primary Care Glennda Weatherholtz: Sherrie Mustache Other Clinician: Referring Matilde Markie: DASHER, DAVID Treating Donaldo Teegarden/Extender: Kathreen Cosier in Treatment: 0 Wound Status Wound Number: 4 Primary Etiology: Diabetic Wound/Ulcer of the Lower Extremity Wound Location: Right Malleolus - Medial Wound Status: Open Wounding Event: Gradually Appeared Comorbid Hypertension, Type II Diabetes, Date Acquired: 03/12/2018 History: Neuropathy Weeks Of Treatment: 0 Clustered Wound: Yes Photos Wound Measurements Length: (cm) 5.5 % Reductio Width: (cm) 4.2 % Reductio Depth: (cm) 0.2  Tunneling: Clustered Quantity: 5 Underminin Area: (cm) 18.143 Volume: (cm) 3.629 n in Area: 0% n in Volume: 0% No g: No Wound Description Classification: Grade 1 Foul Odor Wound Margin: Flat and Intact Slough/Fib Exudate Amount: Medium Exudate Type: Serous Exudate Color: amber After Cleansing: No rino Yes Wound Bed Granulation Amount: None Present (0%) Exposed Structure Necrotic Amount: Large (67-100%) Fascia Exposed: No Necrotic Quality: Adherent Slough Fat Layer (Subcutaneous Tissue) Exposed: Yes Tendon Exposed: No Muscle Exposed: No Joint Exposed: No Bone Exposed: No Periwound Skin Texture Tupy, Kristopher A. ([id number]) Texture Color No Abnormalities Noted: No No Abnormalities Noted: No Callus: No Atrophie Blanche: No Crepitus: No Cyanosis: No Excoriation: No Ecchymosis: No Induration: Yes Erythema: Yes Rash: No Erythema Location: Circumferential Scarring: No Hemosiderin Staining: No Mottled: No Moisture Pallor: No No Abnormalities Noted: No Rubor: No Dry / Scaly: No Maceration: No Wound Preparation Ulcer Cleansing: Rinsed/Irrigated with Saline Topical Anesthetic Applied: Other: lidocaine 4%, Electronic Signature(s) Signed: 05/12/2018 3:25:58 PM By: Rema Jasmine Signed: 05/12/2018 3:42:18 PM By: Elliot Gurney, BSN, RN, CWS, Kim RN, BSN Previous Signature: 05/12/2018 11:39:26 AM Version By: Rema Jasmine Entered  By: Elliot Gurney, BSN, RN, CWS, Kim on 05/12/2018 12:24:54 Sayler, [name] ([id number]) -------------------------------------------------------------------------------- Vitals Details Patient Name: [name] Date of Service: 05/12/2018 9:45 AM Medical Record Number: [id number] Patient Account Number: [id number] Date of Birth/Sex: 16-Feb-1946 (72 y.o. F) Treating RN: Rema Jasmine Primary Care Densel Kronick: Sherrie Mustache Other Clinician: Referring Bocephus Cali: DASHER, DAVID Treating Haik Mahoney/Extender: Kathreen Cosier in Treatment: 0 Vital Signs Time Taken:  09:50 Temperature (F): 97.9 Height (in): 70 Pulse (bpm): 67 Source: Stated Respiratory Rate (breaths/min): 16 Weight (lbs): 193 Blood Pressure (mmHg): 150/55 Body Mass Index (BMI): 27.7 Reference Range: 80 - 120 mg / dl Electronic Signature(s) Signed: 05/12/2018 11:39:26 AM By: Rema Jasmine Entered ByRema Jasmine on 05/12/2018 09:56:32

## 2018-05-26 ENCOUNTER — Encounter: Payer: Medicare HMO | Admitting: Physician Assistant

## 2018-05-26 DIAGNOSIS — L950 Livedoid vasculitis: Secondary | ICD-10-CM | POA: Diagnosis not present

## 2018-05-29 ENCOUNTER — Encounter: Payer: Medicare HMO | Admitting: Physician Assistant

## 2018-05-29 ENCOUNTER — Other Ambulatory Visit
Admission: RE | Admit: 2018-05-29 | Discharge: 2018-05-29 | Disposition: A | Discharge status: Home / Self Care | Payer: Medicare HMO | Source: Ambulatory Visit | Attending: Physician Assistant | Admitting: Physician Assistant

## 2018-05-29 DIAGNOSIS — L950 Livedoid vasculitis: Secondary | ICD-10-CM | POA: Diagnosis not present

## 2018-05-29 DIAGNOSIS — B999 Unspecified infectious disease: Secondary | ICD-10-CM | POA: Diagnosis present

## 2018-05-31 NOTE — Progress Notes (Signed)
CHENEY, GOSCH ([id number]) Visit Report for 05/19/2018 Arrival Information Details Patient Name: [name], [name] Date of Service: 05/19/2018 9:15 AM Medical Record Number: [id number] Patient Account Number: [id number] Date of Birth/Sex: 20-Feb-1946 (72 y.o. F) Treating RN: Curtis Sites Primary Care Aaro Meyers: Sherrie Mustache Other Clinician: Referring Kamren Heskett: DASHER, DAVID Treating Latera Mclin/Extender: Kathreen Cosier in Treatment: 1 Visit Information History Since Last Visit Added or deleted any medications: No Patient Arrived: Ambulatory Any new allergies or adverse reactions: No Arrival Time: 09:42 Had a fall or experienced change in No Accompanied By: self activities of daily living that may affect Transfer Assistance: None risk of falls: Patient Identification Verified: Yes Signs or symptoms of abuse/neglect since last visito No Secondary Verification Process Completed: Yes Hospitalized since last visit: No Implantable device outside of the clinic excluding No cellular tissue based products placed in the center since last visit: Has Dressing in Place as Prescribed: Yes Has Compression in Place as Prescribed: No Pain Present Now: Yes Electronic Signature(s) Signed: 05/19/2018 4:48:53 PM By: Curtis Sites Entered By: Curtis Sites on 05/19/2018 09:44:00 Kary, [name] ([id number]) -------------------------------------------------------------------------------- Encounter Discharge Information Details Patient Name: Marcia [name] Date of Service: 05/19/2018 9:15 AM Medical Record Number: [id number] Patient Account Number: [id number] Date of Birth/Sex: 10-05-1946 (72 y.o. F) Treating RN: Huel Coventry Primary Care Emillie Chasen: Sherrie Mustache Other Clinician: Referring Derriana Oser: Adolphus Birchwood, DAVID Treating Kambre Messner/Extender: Kathreen Cosier in Treatment: 1 Encounter Discharge Information Items Discharge Condition: Stable Ambulatory Status: Ambulatory Discharge Destination:  Home Transportation: Private Auto Schedule Follow-up Appointment: Yes Clinical Summary of Care: Electronic Signature(s) Signed: 05/19/2018 6:04:06 PM By: Elliot Gurney, BSN, RN, CWS, Kim RN, BSN Entered By: Elliot Gurney, BSN, RN, CWS, Kim on 05/19/2018 10:14:26 Hester, [name] ([id number]) -------------------------------------------------------------------------------- Lower Extremity Assessment Details Patient Name: Marcia [name] Date of Service: 05/19/2018 9:15 AM Medical Record Number: [id number] Patient Account Number: [id number] Date of Birth/Sex: 24-Oct-1945 (72 y.o. F) Treating RN: Curtis Sites Primary Care Dajane Valli: Sherrie Mustache Other Clinician: Referring Pollie Poma: DASHER, DAVID Treating Ashar Lewinski/Extender: Kathreen Cosier in Treatment: 1 Edema Assessment Assessed: [Left: No] [Right: No] [Left: Edema] [Right: :] Calf Left: Right: Point of Measurement: 34 cm From Medial Instep cm 41.3 cm Ankle Left: Right: Point of Measurement: 12 cm From Medial Instep cm 23.4 cm Vascular Assessment Pulses: Posterior Tibial Extremity colors, hair growth, and conditions: Extremity Color: [Right:Hyperpigmented] Hair Growth on Extremity: [Right:Yes] Temperature of Extremity: [Right:Warm] Capillary Refill: [Right:< 3 seconds] Toe Nail Assessment Left: Right: Thick: Yes Discolored: Yes Deformed: Yes Improper Length and Hygiene: No Electronic Signature(s) Signed: 05/19/2018 4:48:53 PM By: Curtis Sites Entered By: Curtis Sites on 05/19/2018 09:47:58 Melrose, Deanna AMarland Kitchen ([id number]) -------------------------------------------------------------------------------- Multi Wound Chart Details Patient Name: Marcia [name] Date of Service: 05/19/2018 9:15 AM Medical Record Number: [id number] Patient Account Number: [id number] Date of Birth/Sex: 05-29-1946 (72 y.o. F) Treating RN: Huel Coventry Primary Care Tranesha Lessner: Sherrie Mustache Other Clinician: Referring Ladanian Kelter: Adolphus Birchwood, DAVID Treating  Maxwell Martorano/Extender: Kathreen Cosier in Treatment: 1 Vital Signs Height(in): 70 Pulse(bpm): 69 Weight(lbs): 193 Blood Pressure(mmHg): 115/46 Body Mass Index(BMI): 28 Temperature(F): 98.4 Respiratory Rate 18 (breaths/min): Photos: [2:No Photos] [3:No Photos] [4:No Photos] Wound Location: [2:Right, Lateral Lower Leg] [3:Right Lower Leg - Posterior] [4:Right Malleolus - Medial] Wounding Event: [2:Gradually Appeared] [3:Gradually Appeared] [4:Gradually Appeared] Primary Etiology: [2:Diabetic Wound/Ulcer of the Lower Extremity] [3:Vasculitis] [4:Vasculitis] 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] Comorbid History: [2:N/A] [3:Hypertension, Type II Diabetes, Neuropathy] [4:Hypertension, Type II Diabetes, Neuropathy] Date Acquired: [2:03/12/2018] [  3:03/12/2018] [4:03/12/2018] Weeks of Treatment: [2:1] [3:1] [4:1] Wound Status: [2:Healed - Epithelialized] [3:Open] [4:Open] Clustered Wound: [2:No] [3:No] [4:Yes] Clustered Quantity: [2:N/A] [3:N/A] [4:5] Measurements L x W x D [2:0x0x0] [3:1.4x1.5x0.2] [4:9x4x0.2] (cm) Area (cm) : [2:0] [3:1.649] [4:28.274] Volume (cm) : [2:0] [3:0.33] [4:5.655] % Reduction in Area: [2:100.00%] [3:-55.60%] [4:-55.80%] % Reduction in Volume: [2:100.00%] [3:-211.30%] [4:-55.80%] Classification: [2:Grade 1] [3:Full Thickness Without Exposed Support Structures] [4:Full Thickness Without Exposed Support Structures] Exudate Amount: [2:N/A] [3:Medium] [4:Medium] Exudate Type: [2:N/A] [3:Serous] [4:Serous] Exudate Color: [2:N/A] [3:amber] [4:amber] Wound Margin: [2:N/A] [3:Flat and Intact] [4:Flat and Intact] Granulation Amount: [2:N/A] [3:Small (1-33%)] [4:Small (1-33%)] Granulation Quality: [2:N/A] [3:Pink] [4:Pink] Necrotic Amount: [2:N/A] [3:Large (67-100%)] [4:Large (67-100%)] Epithelialization: [2:N/A] [3:None] [4:None] Debridement: [2:N/A] [3:Debridement - Excisional]  [4:N/A] Pre-procedure [2:N/A] [3:09:56] [4:N/A] Verification/Time Out Taken: Pain Control: [2:N/A] [3:Other] [4:N/A] Tissue Debrided: [2:N/A] [3:Subcutaneous] [4:N/A] Level: [2:N/A] [3:Skin/Subcutaneous Tissue] [4:N/A] Debridement Area (sq cm): [2:N/A] [3:2.1] [4:N/A] Instrument: N/A Curette N/A Bleeding: N/A Minimum N/A Hemostasis Achieved: N/A Pressure N/A Procedural Pain: N/A 0 N/A Post Procedural Pain: N/A 0 N/A Debridement Treatment N/A Procedure was tolerated well N/A Response: Post Debridement N/A 1.4x1.5x0.2 N/A Measurements L x W x D (cm) Post Debridement Volume: N/A 0.33 N/A (cm) Periwound Skin Texture: No Abnormalities Noted Excoriation: No Induration: Yes Induration: No Excoriation: No Callus: No Callus: No Crepitus: No Crepitus: No Rash: No Rash: No Scarring: No Scarring: No Periwound Skin Moisture: No Abnormalities Noted Maceration: No Maceration: No Dry/Scaly: No Dry/Scaly: No Periwound Skin Color: No Abnormalities Noted Hemosiderin Staining: Yes Erythema: Yes Atrophie Blanche: No Atrophie Blanche: No Cyanosis: No Cyanosis: No Ecchymosis: No Ecchymosis: No Erythema: No Hemosiderin Staining: No Mottled: No Mottled: No Pallor: No Pallor: No Rubor: No Rubor: No Erythema Location: N/A N/A Circumferential Tenderness on Palpation: No Yes Yes Wound Preparation: N/A Ulcer Cleansing: Ulcer Cleansing: Rinsed/Irrigated with Saline Rinsed/Irrigated with Saline Topical Anesthetic Applied: Topical Anesthetic Applied: Other: lidocaine 4% Other: lidocaine 4% Procedures Performed: N/A Debridement N/A Treatment Notes Wound #3 (Right, Posterior Lower Leg) 1. Cleansed with: Clean wound with Normal Saline 2. Anesthetic Topical Lidocaine 4% cream to wound bed prior to debridement 4. Dressing Applied: Hydrafera Blue 5. Secondary Dressing Applied Bordered Foam Dressing Wound #4 (Right, Medial Malleolus) 1. Cleansed with: Clean wound with Normal  Saline 2. Anesthetic Topical Lidocaine 4% cream to wound bed prior to debridement 3. Peri-wound Care: Moisturizing lotion [name], [name] ([id number]) Other peri-wound care (specify in notes) 5. Secondary Dressing Applied ABD Pad Notes TCA to wound areas, kerlix wrap Electronic Signature(s) Signed: 05/19/2018 10:24:30 AM By: Bonnell Public Entered By: Bonnell Public on 05/19/2018 10:24:30 Mayabb, [name] ([id number]) -------------------------------------------------------------------------------- Multi-Disciplinary Care Plan Details Patient Name: Marcia [name] Date of Service: 05/19/2018 9:15 AM Medical Record Number: [id number] Patient Account Number: [id number] Date of Birth/Sex: 23-Mar-1946 (72 y.o. F) Treating RN: Huel Coventry Primary Care Collier Bohnet: Sherrie Mustache Other Clinician: Referring Othell Jaime: Adolphus Birchwood, DAVID Treating Ruble Buttler/Extender: Kathreen Cosier in Treatment: 1 Active Inactive ` Abuse / Safety / Falls / Self Care Management Nursing Diagnoses: Potential for falls Goals: Patient will not experience any injury related to falls Date Initiated: 05/12/2018 Target Resolution Date: 08/15/2018 Goal Status: Active Interventions: Assess Activities of Daily Living upon admission and as needed Assess fall risk on admission and as needed Assess: immobility, friction, shearing, incontinence upon admission and as needed Assess impairment of mobility on admission and as needed per policy Assess personal safety and home safety (as indicated) on admission and as needed Notes: ` Nutrition Nursing  Diagnoses: Imbalanced nutrition Impaired glucose control: actual or potential Goals: Patient/caregiver agrees to and verbalizes understanding of need to use nutritional supplements and/or vitamins as prescribed Date Initiated: 05/12/2018 Target Resolution Date: 09/12/2018 Goal Status: Active Patient/caregiver will maintain therapeutic glucose control Date Initiated: 05/12/2018 Target  Resolution Date: 08/15/2018 Goal Status: Active Interventions: Assess patient nutrition upon admission and as needed per policy Provide education on elevated blood sugars and impact on wound healing Provide education on nutrition Notes: [name], [name] ([id number]) Orientation to the Wound Care Program Nursing Diagnoses: Knowledge deficit related to the wound healing center program Goals: Patient/caregiver will verbalize understanding of the Wound Healing Center Program Date Initiated: 05/12/2018 Target Resolution Date: 06/13/2018 Goal Status: Active Interventions: Provide education on orientation to the wound center Notes: ` Wound/Skin Impairment Nursing Diagnoses: Impaired tissue integrity Knowledge deficit related to ulceration/compromised skin integrity Goals: Ulcer/skin breakdown will have a volume reduction of 80% by week 12 Date Initiated: 05/12/2018 Target Resolution Date: 09/05/2018 Goal Status: Active Interventions: Assess patient/caregiver ability to perform ulcer/skin care regimen upon admission and as needed Assess ulceration(s) every visit Notes: Electronic Signature(s) Signed: 05/19/2018 6:04:06 PM By: Elliot Gurney, BSN, RN, CWS, Kim RN, BSN Entered By: Elliot Gurney, BSN, RN, CWS, Kim on 05/19/2018 09:54:41 [name], [name] ([id number]) -------------------------------------------------------------------------------- Pain Assessment Details Patient Name: Marcia [name] Date of Service: 05/19/2018 9:15 AM Medical Record Number: [id number] Patient Account Number: [id number] Date of Birth/Sex: [birth year] (72 y.o. F) Treating RN: Curtis Sites Primary Care Dilyn Osoria: Sherrie Mustache Other Clinician: Referring Adyen Bifulco: Adolphus Birchwood, DAVID Treating Ryana Montecalvo/Extender: Kathreen Cosier in Treatment: 1 Active Problems Location of Pain Severity and Description of Pain Patient Has Paino Yes Site Locations Pain Location: Pain in Ulcers With Dressing Change: Yes Duration of the  Pain. Constant / Intermittento Constant Pain Management and Medication Current Pain Management: Electronic Signature(s) Signed: 05/19/2018 4:48:53 PM By: Curtis Sites Entered By: Curtis Sites on 05/19/2018 09:44:20 Feick, [name] ([id number]) -------------------------------------------------------------------------------- Patient/Caregiver Education Details Patient Name: Marcia [name] Date of Service: 05/19/2018 9:15 AM Medical Record Number: [id number] Patient Account Number: [id number] Date of Birth/Gender: 1946-09-22 (72 y.o. F) Treating RN: Huel Coventry Primary Care Physician: Sherrie Mustache Other Clinician: Referring Physician: Adolphus Birchwood, DAVID Treating Physician/Extender: Kathreen Cosier in Treatment: 1 Education Assessment Education Provided To: Patient Education Topics Provided Wound Debridement: Handouts: Wound Debridement Methods: Explain/Verbal Responses: State content correctly Wound/Skin Impairment: Handouts: Caring for Your Ulcer Methods: Explain/Verbal Responses: State content correctly Electronic Signature(s) Signed: 05/19/2018 6:04:06 PM By: Elliot Gurney, BSN, RN, CWS, Kim RN, BSN Entered By: Elliot Gurney, BSN, RN, CWS, Kim on 05/19/2018 10:14:42 [name], [name] ([id number]) -------------------------------------------------------------------------------- Wound Assessment Details Patient Name: Marcia [name] Date of Service: 05/19/2018 9:15 AM Medical Record Number: [id number] Patient Account Number: [id number] Date of Birth/Sex: Aug 26, 1946 (72 y.o. F) Treating RN: Curtis Sites Primary Care Abundio Teuscher: Sherrie Mustache Other Clinician: Referring Magdalen Cabana: DASHER, DAVID Treating Dakai Braithwaite/Extender: Kathreen Cosier in Treatment: 1 Wound Status Wound Number: 2 Primary Etiology: Diabetic Wound/Ulcer of the Lower Extremity Wound Location: Right, Lateral Lower Leg Secondary Fungal Wounding Event: Gradually Appeared Etiology: Date Acquired: 03/12/2018 Wound Status: Healed  - Epithelialized Weeks Of Treatment: 1 Clustered Wound: No Photos Photo Uploaded By: Curtis Sites on 05/19/2018 10:59:59 Wound Measurements Length: (cm) 0 % Width: (cm) 0 % Depth: (cm) 0 Area: (cm) 0 Volume: (cm) 0 Reduction in Area: 100% Reduction in Volume: 100% Wound Description Classification: Grade 1 Periwound Skin Texture Texture Color No Abnormalities Noted: No No Abnormalities Noted: No Moisture No Abnormalities Noted: No  Electronic Signature(s) Signed: 05/19/2018 4:48:53 PM By: Curtis Sites Entered By: Curtis Sites on 05/19/2018 09:50:44 Drakeford, [name] ([id number]) -------------------------------------------------------------------------------- Wound Assessment Details Patient Name: Marcia [name] Date of Service: 05/19/2018 9:15 AM Medical Record Number: [id number] Patient Account Number: [id number] Date of Birth/Sex: 08/03/1946 (72 y.o. F) Treating RN: Curtis Sites Primary Care Ronasia Isola: Sherrie Mustache Other Clinician: Referring Braylie Badami: DASHER, DAVID Treating Tej Murdaugh/Extender: Kathreen Cosier in Treatment: 1 Wound Status Wound Number: 3 Primary Etiology: Vasculitis Wound Location: Right Lower Leg - Posterior Secondary Diabetic Wound/Ulcer of the Lower Etiology: Extremity Wounding Event: Gradually Appeared Wound Status: Open Date Acquired: 03/12/2018 Comorbid History: Hypertension, Type II Diabetes, Weeks Of Treatment: 1 Neuropathy Clustered Wound: No Photos Photo Uploaded By: Curtis Sites on 05/19/2018 11:00:21 Wound Measurements Length: (cm) 1.4 Width: (cm) 1.5 Depth: (cm) 0.2 Area: (cm) 1.649 Volume: (cm) 0.33 % Reduction in Area: -55.6% % Reduction in Volume: -211.3% Epithelialization: None Tunneling: No Undermining: No Wound Description Full Thickness Without Exposed Support Classification: Structures Wound Margin: Flat and Intact Exudate Medium Amount: Exudate Type: Serous Exudate Color: amber Foul Odor After  Cleansing: No Slough/Fibrino Yes Wound Bed Granulation Amount: Small (1-33%) Exposed Structure Granulation Quality: Pink Fascia Exposed: No Necrotic Amount: Large (67-100%) Fat Layer (Subcutaneous Tissue) Exposed: Yes Necrotic Quality: Adherent Slough Tendon Exposed: No Muscle Exposed: No Joint Exposed: No Bone Exposed: No [name], [name] ([id number]) Periwound Skin Texture Texture Color No Abnormalities Noted: No No Abnormalities Noted: No Callus: No Atrophie Blanche: No Crepitus: No Cyanosis: No Excoriation: No Ecchymosis: No Induration: No Erythema: No Rash: No Hemosiderin Staining: Yes Scarring: No Mottled: No Pallor: No Moisture Rubor: No No Abnormalities Noted: No Dry / Scaly: No Temperature / Pain Maceration: No Tenderness on Palpation: Yes Wound Preparation Ulcer Cleansing: Rinsed/Irrigated with Saline Topical Anesthetic Applied: Other: lidocaine 4%, Treatment Notes Wound #3 (Right, Posterior Lower Leg) 1. Cleansed with: Clean wound with Normal Saline 2. Anesthetic Topical Lidocaine 4% cream to wound bed prior to debridement 4. Dressing Applied: Hydrafera Blue 5. Secondary Dressing Applied Bordered Foam Dressing Electronic Signature(s) Signed: 05/19/2018 4:48:53 PM By: Curtis Sites Entered By: Curtis Sites on 05/19/2018 09:51:01 Beiser, [name] ([id number]) -------------------------------------------------------------------------------- Wound Assessment Details Patient Name: Marcia [name] Date of Service: 05/19/2018 9:15 AM Medical Record Number: [id number] Patient Account Number: [id number] Date of Birth/Sex: 29-Sep-1946 (72 y.o. F) Treating RN: Curtis Sites Primary Care Takara Sermons: Sherrie Mustache Other Clinician: Referring Jalana Moore: DASHER, DAVID Treating Geryl Dohn/Extender: Kathreen Cosier in Treatment: 1 Wound Status Wound Number: 4 Primary Etiology: Vasculitis Wound Location: Right Malleolus - Medial Secondary Diabetic Wound/Ulcer of  the Lower Etiology: Extremity Wounding Event: Gradually Appeared Wound Status: Open Date Acquired: 03/12/2018 Comorbid History: Hypertension, Type II Diabetes, Weeks Of Treatment: 1 Neuropathy Clustered Wound: Yes Photos Photo Uploaded By: Curtis Sites on 05/19/2018 11:00:22 Wound Measurements Length: (cm) 9 Width: (cm) 4 Depth: (cm) 0.2 Clustered Quantity: 5 Area: (cm) 28.274 Volume: (cm) 5.655 % Reduction in Area: -55.8% % Reduction in Volume: -55.8% Epithelialization: None Tunneling: No Undermining: No Wound Description Full Thickness Without Exposed Support Classification: Structures Wound Margin: Flat and Intact Exudate Medium Amount: Exudate Type: Serous Exudate Color: amber Foul Odor After Cleansing: No Slough/Fibrino Yes Wound Bed Granulation Amount: Small (1-33%) Exposed Structure Granulation Quality: Pink Fascia Exposed: No Necrotic Amount: Large (67-100%) Fat Layer (Subcutaneous Tissue) Exposed: Yes Necrotic Quality: Adherent Slough Tendon Exposed: No Muscle Exposed: No Joint Exposed: No Corrigan, Alora A. ([id number]) Bone Exposed: No Periwound Skin Texture Texture Color No Abnormalities Noted: No No  Abnormalities Noted: No Callus: No Atrophie Blanche: No Crepitus: No Cyanosis: No Excoriation: No Ecchymosis: No Induration: Yes Erythema: Yes Rash: No Erythema Location: Circumferential Scarring: No Hemosiderin Staining: No Mottled: No Moisture Pallor: No No Abnormalities Noted: No Rubor: No Dry / Scaly: No Maceration: No Temperature / Pain Tenderness on Palpation: Yes Wound Preparation Ulcer Cleansing: Rinsed/Irrigated with Saline Topical Anesthetic Applied: Other: lidocaine 4%, Treatment Notes Wound #4 (Right, Medial Malleolus) 1. Cleansed with: Clean wound with Normal Saline 2. Anesthetic Topical Lidocaine 4% cream to wound bed prior to debridement 3. Peri-wound Care: Moisturizing lotion Other peri-wound care (specify in  notes) 5. Secondary Dressing Applied ABD Pad Notes TCA to wound areas, kerlix wrap Electronic Signature(s) Signed: 05/19/2018 4:48:53 PM By: Curtis Sites Entered By: Curtis Sites on 05/19/2018 09:51:30 Hahne, [name] ([id number]) -------------------------------------------------------------------------------- Vitals Details Patient Name: Marcia [name] Date of Service: 05/19/2018 9:15 AM Medical Record Number: [id number] Patient Account Number: [id number] Date of Birth/Sex: [birth year] (72 y.o. F) Treating RN: Curtis Sites Primary Care Shronda Boeh: Sherrie Mustache Other Clinician: Referring Kaleesi Guyton: DASHER, DAVID Treating Caddie Randle/Extender: Kathreen Cosier in Treatment: 1 Vital Signs Time Taken: 09:44 Temperature (F): 98.4 Height (in): 70 Pulse (bpm): 69 Weight (lbs): 193 Respiratory Rate (breaths/min): 18 Body Mass Index (BMI): 27.7 Blood Pressure (mmHg): 115/46 Reference Range: 80 - 120 mg / dl Electronic Signature(s) Signed: 05/19/2018 4:48:53 PM By: Curtis Sites Entered By: Curtis Sites on 05/19/2018 09:45:34

## 2018-05-31 NOTE — Progress Notes (Signed)
RAMAH, LANGHANS ([id number]) Visit Report for 05/19/2018 Chief Complaint Document Details Patient Name: [name], [name] Date of Service: 05/19/2018 9:15 AM Medical Record Number: [id number] Patient Account Number: [id number] Date of Birth/Sex: [birth year] (72 y.o. F) Treating RN: Phillis Haggis Primary Care Provider: Sherrie Mustache Other Clinician: Referring Provider: Adolphus Birchwood, DAVID Treating Provider/Extender: Kathreen Cosier in Treatment: 1 Information Obtained from: Patient Chief Complaint RLE wounds Electronic Signature(s) Signed: 05/19/2018 10:24:46 AM By: Bonnell Public Entered By: Bonnell Public on 05/19/2018 10:24:46 Kimmel, [name] ([id number]) -------------------------------------------------------------------------------- Debridement Details Patient Name: Marcia [name] Date of Service: 05/19/2018 9:15 AM Medical Record Number: [id number] Patient Account Number: [id number] Date of Birth/Sex: [birth year] (72 y.o. F) Treating RN: Huel Coventry Primary Care Provider: Sherrie Mustache Other Clinician: Referring Provider: Adolphus Birchwood, DAVID Treating Provider/Extender: Kathreen Cosier in Treatment: 1 Debridement Performed for Wound #3 Right,Posterior Lower Leg Assessment: Performed By: Physician Bonnell Public, NP Debridement Type: Debridement Severity of Tissue Pre Fat layer exposed Debridement: Pre-procedure Verification/Time Yes - 09:56 Out Taken: Start Time: 09:56 Pain Control: Other : lidocaine 4% Total Area Debrided (L x W): 1.4 (cm) x 1.5 (cm) = 2.1 (cm) Tissue and other material Viable, Non-Viable, Subcutaneous, Fibrin/Exudate debrided: Level: Skin/Subcutaneous Tissue Debridement Description: Excisional Instrument: Curette Bleeding: Minimum Hemostasis Achieved: Pressure End Time: 09:58 Procedural Pain: 0 Post Procedural Pain: 0 Response to Treatment: Procedure was tolerated well Level of Consciousness: Awake and Alert Post Debridement Measurements of Total  Wound Length: (cm) 1.4 Width: (cm) 1.5 Depth: (cm) 0.2 Volume: (cm) 0.33 Character of Wound/Ulcer Post Debridement: Stable Severity of Tissue Post Debridement: Fat layer exposed Post Procedure Diagnosis Same as Pre-procedure Electronic Signature(s) Signed: 05/19/2018 5:29:48 PM By: Bonnell Public Signed: 05/19/2018 6:04:06 PM By: Elliot Gurney, BSN, RN, CWS, Kim RN, BSN Entered By: Elliot Gurney, BSN, RN, CWS, Kim on 05/19/2018 09:57:32 Hawks, [name] ([id number]) -------------------------------------------------------------------------------- HPI Details Patient Name: Marcia [name] Date of Service: 05/19/2018 9:15 AM Medical Record Number: [id number] Patient Account Number: [id number] Date of Birth/Sex: May 08, 1946 (72 y.o. F) Treating RN: Phillis Haggis Primary Care Provider: Sherrie Mustache Other Clinician: Referring Provider: DASHER, DAVID Treating Provider/Extender: Kathreen Cosier in Treatment: 1 History of Present Illness HPI Description: 05/12/18-She is seen in initial evaluation for multiple open areas to the right lower extremity. She was recently hospitalized (7/30-8/2) with cellulitis of the right leg, failed outpatient therapy with Keflex. While hospitalized she was treated with IV vancomycin and cefepime; right foot x-ray was negative for osteomyelitis. She admits to intermittent pain, improved since hospitalization. She was discharged on doxycycline which she should complete with the next 48 hours. 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 She has a remote history of following with vascular medicine for venous reflux, she does not tolerate wearing compression stockings d/t skin sensitivities and "burning" sensation. In office ABI are elevated but she has strongly palpable pulses. We will initiate hydrofera blue and ace wrap compression. She is a diabetic with A1c 6.9 while  hospitalized. 05/19/18- She is seen in follow up evaluation for RLE wounds; the lateral area is healed. She did tolerate ace wrap for compression, but experienced increased xerosis. We will hold off on ace wrap and increase lachydrin to twice daily. She c/o pain to the medial cluster of wounds, there is no apparent infection and will initiate steroid cream. She will follow up next week Electronic Signature(s) Signed: 05/19/2018 10:27:01 AM By: Bonnell Public Entered By: Bonnell Public  on 05/19/2018 10:27:00 AZAYLEA, MAVES ([id number]) -------------------------------------------------------------------------------- Physician Orders Details Patient Name: [name], [name] Date of Service: 05/19/2018 9:15 AM Medical Record Number: [id number] Patient Account Number: [id number] Date of Birth/Sex: 10/27/1945 (72 y.o. F) Treating RN: Huel Coventry Primary Care Provider: Sherrie Mustache Other Clinician: Referring Provider: Adolphus Birchwood, DAVID Treating Provider/Extender: Kathreen Cosier in Treatment: 1 Verbal / Phone Orders: No Diagnosis Coding Wound Cleansing Wound #3 Right,Posterior Lower Leg o Clean wound with Normal Saline. o Cleanse wound with mild soap and water o May Shower, gently pat wound dry prior to applying new dressing. Wound #4 Right,Medial Malleolus o Clean wound with Normal Saline. o Cleanse wound with mild soap and water o May Shower, gently pat wound dry prior to applying new dressing. Anesthetic (add to Medication List) Wound #3 Right,Posterior Lower Leg o Topical Lidocaine 4% cream applied to wound bed prior to debridement (In Clinic Only). Wound #4 Right,Medial Malleolus o Topical Lidocaine 4% cream applied to wound bed prior to debridement (In Clinic Only). Skin Barriers/Peri-Wound Care Wound #3 Right,Posterior Lower Leg o Moisturizing lotion - Lac hydrin daily Wound #4 Right,Medial Malleolus o Moisturizing lotion - Lac hydrin daily Primary Wound  Dressing Wound #3 Right,Posterior Lower Leg o Hydrafera Blue Ready Transfer Wound #4 Right,Medial Malleolus o Other: - Steroid cream daily Secondary Dressing Wound #3 Right,Posterior Lower Leg o Boardered Foam Dressing Wound #4 Right,Medial Malleolus o ABD pad o Conform/Kerlix Dressing Change Frequency Wound #3 Right,Posterior Lower Leg o Change dressing every other day. ARNETT, GALINDEZ A. ([id number]) Wound #4 Right,Medial Malleolus o Change dressing every day. Follow-up Appointments Wound #3 Right,Posterior Lower Leg o Return Appointment in 1 week. Wound #4 Right,Medial Malleolus o Return Appointment in 1 week. Additional Orders / Instructions Wound #3 Right,Posterior Lower Leg o Increase protein intake. Wound #4 Right,Medial Malleolus o Increase protein intake. Patient Medications Allergies: No Known Allergies Notifications Medication Indication Start End Diprolene 05/20/2018 DOSE topical 0.05 % ointment - ointment topical; apply thin layer to right leg wound daily Electronic Signature(s) Signed: 05/19/2018 10:24:01 AM By: Bonnell Public Entered By: Bonnell Public on 05/19/2018 10:24:00 Keady, [name] ([id number]) -------------------------------------------------------------------------------- Problem List Details Patient Name: Marcia [name] Date of Service: 05/19/2018 9:15 AM Medical Record Number: [id number] Patient Account Number: [id number] Date of Birth/Sex: [birth year] (72 y.o. 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 Date of Service: 05/19/2018 9:15 AM Medical Record Number: [id number] Patient Account Number: [id number] Date of Birth/Sex: 1946/05/05 (72 y.o. F) Treating RN: Phillis Haggis Primary Care Provider: Sherrie Mustache Other Clinician: Referring Provider: DASHER, DAVID Treating Provider/Extender: Kathreen Cosier in Treatment: 1 Subjective Chief Complaint Information obtained from Patient RLE wounds History of Present Illness (HPI) 05/12/18-She is seen in initial evaluation for multiple open areas to the right lower extremity. She was recently hospitalized (7/30-8/2) with cellulitis of the right leg, failed outpatient therapy with Keflex. While hospitalized she  was treated with IV vancomycin and cefepime; right foot x-ray was negative for osteomyelitis. She admits to intermittent pain, improved since hospitalization. She was discharged on doxycycline which she should complete with the next 48 hours. 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 She has a remote history of following with vascular medicine for venous reflux, she does not tolerate wearing compression stockings d/t skin sensitivities and "burning" sensation. In office ABI are elevated but she has strongly palpable pulses. We will initiate hydrofera blue and ace wrap compression. She is a diabetic with A1c 6.9 while hospitalized. 05/19/18- She is seen in follow up evaluation for RLE wounds; the lateral area is healed. She did tolerate  ace wrap for compression, but experienced increased xerosis. We will hold off on ace wrap and increase lachydrin to twice daily. She c/o pain to the medial cluster of wounds, there is no apparent infection and will initiate steroid cream. She will follow up next week Objective Constitutional Vitals Time Taken: 9:44 AM, Height: 70 in, Weight: 193 lbs, BMI: 27.7, Temperature: 98.4 F, Pulse: 69 bpm, Respiratory Rate: 18 breaths/min, Blood Pressure: 115/46 mmHg. Integumentary (Hair, Skin) Wound #2 status is Healed - Epithelialized. Original cause of wound was Gradually Appeared. The wound is located on the Right,Lateral Lower Leg. The wound measures 0cm length x 0cm width x 0cm depth; 0cm^2 area and 0cm^3 volume. Wound #3 status is Open. Original cause of wound was Gradually Appeared. The wound is located on the Right,Posterior Lower Leg. The wound measures 1.4cm length x 1.5cm width x 0.2cm depth; 1.649cm^2 area and 0.33cm^3 volume. There is Fat Layer (Subcutaneous Tissue) Exposed exposed. There is no tunneling or undermining noted. There is a medium amount of serous drainage noted. The wound margin is flat and intact. There is small (1-33%) pink granulation within the wound bed. There is a large (67-100%) amount of necrotic tissue within the wound bed including Adherent Slough. The periwound skin appearance exhibited: Hemosiderin Staining. The periwound skin appearance did not exhibit: Callus, Crepitus, Excoriation, Induration, Rash, Scarring, Dry/Scaly, Maceration, Atrophie Blanche, Cyanosis, Ecchymosis, Mottled, Pallor, Rubor, Erythema. The periwound has tenderness on palpation. JAQULINE, CURL A. ([id number]) Wound #4 status is Open. Original cause of wound was Gradually Appeared. The wound is located on the Right,Medial Malleolus. The wound measures 9cm length x 4cm width x 0.2cm depth; 28.274cm^2 area and 5.655cm^3 volume. There is Fat Layer (Subcutaneous Tissue) Exposed exposed. There is no  tunneling or undermining noted. There is a medium amount of serous drainage noted. The wound margin is flat and intact. There is small (1-33%) pink granulation within the wound bed. There is a large (67-100%) amount of necrotic tissue within the wound bed including Adherent Slough. The periwound skin appearance exhibited: Induration, Erythema. The periwound skin appearance did not exhibit: Callus, Crepitus, Excoriation, Rash, Scarring, Dry/Scaly, Maceration, Atrophie Blanche, Cyanosis, Ecchymosis, Hemosiderin Staining, Mottled, Pallor, Rubor. The surrounding wound skin color is noted with erythema which is circumferential. The periwound has tenderness on palpation. 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 There was a Excisional Skin/Subcutaneous Tissue Debridement with a total area of 2.1 sq  cm performed by Bonnell Public, NP. With the following instrument(s): Curette to remove Viable and Non-Viable tissue/material. Material removed includes Subcutaneous Tissue and Fibrin/Exudate and after achieving pain control using Other (lidocaine 4%). No specimens were taken. A time out was conducted at 09:56, prior to the start of the procedure. A Minimum amount of bleeding was controlled with Pressure. The procedure was tolerated well with a pain level of 0 throughout and a pain level of 0 following the procedure. Patient s Level of Consciousness post procedure was recorded as Awake and Alert. Post Debridement Measurements: 1.4cm length x 1.5cm width x 0.2cm depth; 0.33cm^3 volume. Character of Wound/Ulcer Post Debridement is stable. Severity of  Tissue Post Debridement is: Fat layer exposed. Post procedure Diagnosis Wound #3: Same as Pre-Procedure Plan Wound Cleansing: Wound #3 Right,Posterior Lower Leg: Clean wound with Normal Saline. Cleanse wound with mild soap and water May Shower, gently pat wound dry prior to applying new dressing. Wound #4 Right,Medial Malleolus: Clean wound with Normal Saline. Cleanse wound with mild soap and water May Shower, gently pat wound dry prior to applying new dressing. GILLIE, FLEITES ([id number]) Anesthetic (add to Medication List): Wound #3 Right,Posterior Lower Leg: Topical Lidocaine 4% cream applied to wound bed prior to debridement (In Clinic Only). Wound #4 Right,Medial Malleolus: Topical Lidocaine 4% cream applied to wound bed prior to debridement (In Clinic Only). Skin Barriers/Peri-Wound Care: Wound #3 Right,Posterior Lower Leg: Moisturizing lotion - Lac hydrin daily Wound #4 Right,Medial Malleolus: Moisturizing lotion - Lac hydrin daily Primary Wound Dressing: Wound #3 Right,Posterior Lower Leg: Hydrafera Blue Ready Transfer Wound #4 Right,Medial Malleolus: Other: - Steroid cream daily Secondary Dressing: Wound #3 Right,Posterior Lower Leg: Boardered Foam Dressing Wound #4 Right,Medial Malleolus: ABD pad Conform/Kerlix Dressing Change Frequency: Wound #3 Right,Posterior Lower Leg: Change dressing every other day. Wound #4 Right,Medial Malleolus: Change dressing every day. Follow-up Appointments: Wound #3 Right,Posterior Lower Leg: Return Appointment in 1 week. Wound #4 Right,Medial Malleolus: Return Appointment in 1 week. Additional Orders / Instructions: Wound #3 Right,Posterior Lower Leg: Increase protein intake. Wound #4 Right,Medial Malleolus: Increase protein intake. The following medication(s) was prescribed: Diprolene topical 0.05 % ointment ointment topical; apply thin layer to right leg wound daily starting 05/20/2018 Electronic Signature(s) Signed:  05/19/2018 10:27:23 AM By: Bonnell Public Entered By: Bonnell Public on 05/19/2018 10:27:23 Steele, [name] ([id number]) -------------------------------------------------------------------------------- SuperBill Details Patient Name: Marcia [name] Date of Service: 05/19/2018 Medical Record Number: [id number] Patient Account Number: [id number] Date of Birth/Sex: 10-Mar-1946 (72 y.o. 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 E11.622 Type 2 diabetes mellitus with other skin ulcer Facility Procedures CPT4 Code: 08657846 Description: 11042 - DEB SUBQ TISSUE 20 SQ CM/< ICD-10 Diagnosis Description L97.212 Non-pressure chronic ulcer of right calf with fat layer exp Modifier: osed Quantity: 1 Physician Procedures CPT4 Code: [id number] Description: 11042 - WC PHYS SUBQ TISS 20 SQ CM ICD-10 Diagnosis Description L97.212 Non-pressure chronic ulcer of right calf with fat layer exp Modifier: osed Quantity: 1 Electronic Signature(s) Signed: 05/19/2018 10:27:40 AM By: Bonnell Public Entered By: Bonnell Public on 05/19/2018 10:27:39

## 2018-06-01 ENCOUNTER — Other Ambulatory Visit: Payer: Self-pay | Admitting: Physician Assistant

## 2018-06-01 DIAGNOSIS — L97312 Non-pressure chronic ulcer of right ankle with fat layer exposed: Secondary | ICD-10-CM

## 2018-06-01 DIAGNOSIS — L97212 Non-pressure chronic ulcer of right calf with fat layer exposed: Secondary | ICD-10-CM

## 2018-06-01 DIAGNOSIS — L950 Livedoid vasculitis: Secondary | ICD-10-CM

## 2018-06-01 LAB — AEROBIC CULTURE  (SUPERFICIAL SPECIMEN)

## 2018-06-01 LAB — AEROBIC CULTURE W GRAM STAIN (SUPERFICIAL SPECIMEN): Gram Stain: NONE SEEN

## 2018-06-02 ENCOUNTER — Encounter: Payer: Medicare HMO | Admitting: Physician Assistant

## 2018-06-02 DIAGNOSIS — L950 Livedoid vasculitis: Secondary | ICD-10-CM | POA: Diagnosis not present

## 2018-06-05 NOTE — Progress Notes (Signed)
Marcia, [name] ([id number]) Visit Report for 05/26/2018 Arrival Information Details Patient Name: [name], SIVERSON A. Date of Service: 05/26/2018 10:30 AM Medical Record Number: [id number] Patient Account Number: [id number] Date of Birth/Sex: 07/06/1946 (72 y.o. F) Treating RN: Curtis Sites Primary Care Kahlee Metivier: Sherrie Mustache Other Clinician: Referring Chandani Rogowski: DASHER, DAVID Treating Sanaya Gwilliam/Extender: Linwood Dibbles, HOYT Weeks in Treatment: 2 Visit Information History Since Last Visit Added or deleted any medications: No Patient Arrived: Ambulatory Any new allergies or adverse reactions: No Arrival Time: 10:58 Had a fall or experienced change in No Accompanied By: self activities of daily living that may affect Transfer Assistance: None risk of falls: Patient Identification Verified: Yes Signs or symptoms of abuse/neglect since last visito No Secondary Verification Process Completed: Yes Hospitalized since last visit: No Implantable device outside of the clinic excluding No cellular tissue based products placed in the center since last visit: Has Dressing in Place as Prescribed: Yes Pain Present Now: No Electronic Signature(s) Signed: 05/26/2018 5:07:08 PM By: Curtis Sites Entered By: Curtis Sites on 05/26/2018 10:59:08 Sakurai, [name] ([id number]) -------------------------------------------------------------------------------- Lower Extremity Assessment Details Patient Name: [name] Date of Service: 05/26/2018 10:30 AM Medical Record Number: [id number] Patient Account Number: [id number] Date of Birth/Sex: 1946/05/16 (72 y.o. F) Treating RN: Curtis Sites Primary Care Terralyn Matsumura: Sherrie Mustache Other Clinician: Referring Obbie Lewallen: DASHER, DAVID Treating Skyylar Kopf/Extender: Linwood Dibbles, HOYT Weeks in Treatment: 2 Edema Assessment Assessed: [Left: No] [Right: No] [Left: Edema] [Right: :] Calf Left: Right: Point of Measurement: 34 cm From Medial Instep cm 43.5  cm Ankle Left: Right: Point of Measurement: 12 cm From Medial Instep cm 23 cm Vascular Assessment Claudication: Claudication Assessment [Right:Rest Pain] Pulses: Dorsalis Pedis Palpable: [Right:Yes] Posterior Tibial Extremity colors, hair growth, and conditions: Extremity Color: [Right:Hyperpigmented] Hair Growth on Extremity: [Right:Yes] Temperature of Extremity: [Right:Warm] Capillary Refill: [Right:< 3 seconds] Toe Nail Assessment Left: Right: Thick: Yes Discolored: Yes Deformed: Yes Improper Length and Hygiene: Yes Electronic Signature(s) Signed: 05/26/2018 5:07:08 PM By: Curtis Sites Entered By: Curtis Sites on 05/26/2018 11:07:39 Weisheit, Kellene AMarland Kitchen ([id number]) -------------------------------------------------------------------------------- Multi Wound Chart Details Patient Name: [name] Date of Service: 05/26/2018 10:30 AM Medical Record Number: [id number] Patient Account Number: [id number] Date of Birth/Sex: 04-26-1946 (72 y.o. F) Treating RN: Phillis Haggis Primary Care Sherril Heyward: Sherrie Mustache Other Clinician: Referring Lannette Avellino: DASHER, DAVID Treating Arly Salminen/Extender: STONE III, HOYT Weeks in Treatment: 2 Vital Signs Height(in): 70 Pulse(bpm): 64 Weight(lbs): 193 Blood Pressure(mmHg): 135/66 Body Mass Index(BMI): 28 Temperature(F): 98.1 Respiratory Rate 18 (breaths/min): Photos: [3:No Photos] [4:No Photos] [N/A:N/A] Wound Location: [3:Right Lower Leg - Posterior] [4:Right Malleolus - Medial] [N/A:N/A] Wounding Event: [3:Gradually Appeared] [4:Gradually Appeared] [N/A:N/A] Primary Etiology: [3:Vasculitis] [4:Vasculitis] [N/A:N/A] 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] [N/A:N/A] Comorbid History: [3:Hypertension, Type II Diabetes, Neuropathy] [4:Hypertension, Type II Diabetes, Neuropathy] [N/A:N/A] Date Acquired: [3:03/12/2018] [4:03/12/2018] [N/A:N/A] Weeks of Treatment:  [3:2] [4:2] [N/A:N/A] Wound Status: [3:Open] [4:Open] [N/A:N/A] Clustered Wound: [3:No] [4:Yes] [N/A:N/A] Clustered Quantity: [3:N/A] [4:5] [N/A:N/A] Measurements L x W x D [3:2.3x1.6x0.2] [4:9.8x1.6x0.1] [N/A:N/A] (cm) Area (cm) : [3:2.89] [4:12.315] [N/A:N/A] Volume (cm) : [3:0.578] [4:1.232] [N/A:N/A] % Reduction in Area: [3:-172.60%] [4:32.10%] [N/A:N/A] % Reduction in Volume: [3:-445.30%] [4:66.10%] [N/A:N/A] Classification: [3:Full Thickness Without Exposed Support Structures] [4:Full Thickness Without Exposed Support Structures] [N/A:N/A] Exudate Amount: [3:Large] [4:Large] [N/A:N/A] Exudate Type: [3:Serous] [4:Serous] [N/A:N/A] Exudate Color: [3:amber] [4:amber] [N/A:N/A] Wound Margin: [3:Flat and Intact] [4:Flat and Intact] [N/A:N/A] Granulation Amount: [3:Large (67-100%)] [4:Small (1-33%)] [N/A:N/A] Granulation Quality: [3:Pink] [4:Pink] [N/A:N/A] Necrotic Amount: [  3:Small (1-33%)] [4:Large (67-100%)] [N/A:N/A] Exposed Structures: [3:Fat Layer (Subcutaneous Tissue) Exposed: Yes Fascia: No Tendon: No Muscle: No Joint: No Bone: No] [4:Fat Layer (Subcutaneous Tissue) Exposed: Yes Fascia: No Tendon: No Muscle: No Joint: No Bone: No] [N/A:N/A] Epithelialization: [3:Small (1-33%)] [4:Large (67-100%)] [N/A:N/A] Periwound Skin Texture: [N/A:N/A] Excoriation: No Induration: Yes Induration: No Excoriation: No Callus: No Callus: No Crepitus: No Crepitus: No Rash: No Rash: No Scarring: No Scarring: No Periwound Skin Moisture: Maceration: Yes Maceration: No N/A Dry/Scaly: No Dry/Scaly: No Periwound Skin Color: Hemosiderin Staining: Yes Erythema: Yes N/A Atrophie Blanche: No Atrophie Blanche: No Cyanosis: No Cyanosis: No Ecchymosis: No Ecchymosis: No Erythema: No Hemosiderin Staining: No Mottled: No Mottled: No Pallor: No Pallor: No Rubor: No Rubor: No Erythema Location: N/A Circumferential N/A Tenderness on Palpation: Yes Yes N/A Wound Preparation: Ulcer  Cleansing: Ulcer Cleansing: N/A Rinsed/Irrigated with Saline Rinsed/Irrigated with Saline Topical Anesthetic Applied: Topical Anesthetic Applied: Other: lidocaine 4% Other: lidocaine 4% Treatment Notes Electronic Signature(s) Signed: 05/27/2018 4:43:18 PM By: Alejandro Mulling Entered By: Alejandro Mulling on 05/26/2018 11:27:15 Mcerlean, [name] ([id number]) -------------------------------------------------------------------------------- Multi-Disciplinary Care Plan Details Patient Name: [name] Date of Service: 05/26/2018 10:30 AM Medical Record Number: [id number] Patient Account Number: [id number] Date of Birth/Sex: 1946-05-26 (72 y.o. F) Treating RN: Phillis Haggis Primary Care Caelynn Marshman: Sherrie Mustache Other Clinician: Referring Ulmer Degen: DASHER, DAVID Treating Margarita Croke/Extender: Linwood Dibbles, HOYT Weeks in Treatment: 2 Active Inactive ` Abuse / Safety / Falls / Self Care Management Nursing Diagnoses: Potential for falls Goals: Patient will not experience any injury related to falls Date Initiated: 05/12/2018 Target Resolution Date: 08/15/2018 Goal Status: Active Interventions: Assess Activities of Daily Living upon admission and as needed Assess fall risk on admission and as needed Assess: immobility, friction, shearing, incontinence upon admission and as needed Assess impairment of mobility on admission and as needed per policy Assess personal safety and home safety (as indicated) on admission and as needed Notes: ` Nutrition Nursing Diagnoses: Imbalanced nutrition Impaired glucose control: actual or potential Goals: Patient/caregiver agrees to and verbalizes understanding of need to use nutritional supplements and/or vitamins as prescribed Date Initiated: 05/12/2018 Target Resolution Date: 09/12/2018 Goal Status: Active Patient/caregiver will maintain therapeutic glucose control Date Initiated: 05/12/2018 Target Resolution Date: 08/15/2018 Goal Status:  Active Interventions: Assess patient nutrition upon admission and as needed per policy Provide education on elevated blood sugars and impact on wound healing Provide education on nutrition Notes: RASHON, REZEK ([id number]) Orientation to the Wound Care Program Nursing Diagnoses: Knowledge deficit related to the wound healing center program Goals: Patient/caregiver will verbalize understanding of the Wound Healing Center Program Date Initiated: 05/12/2018 Target Resolution Date: 06/13/2018 Goal Status: Active Interventions: Provide education on orientation to the wound center Notes: ` Wound/Skin Impairment Nursing Diagnoses: Impaired tissue integrity Knowledge deficit related to ulceration/compromised skin integrity Goals: Ulcer/skin breakdown will have a volume reduction of 80% by week 12 Date Initiated: 05/12/2018 Target Resolution Date: 09/05/2018 Goal Status: Active Interventions: Assess patient/caregiver ability to perform ulcer/skin care regimen upon admission and as needed Assess ulceration(s) every visit Notes: Electronic Signature(s) Signed: 05/27/2018 4:43:18 PM By: Alejandro Mulling Entered By: Alejandro Mulling on 05/26/2018 11:27:02 Figiel, [name] ([id number]) -------------------------------------------------------------------------------- Pain Assessment Details Patient Name: [name] Date of Service: 05/26/2018 10:30 AM Medical Record Number: [id number] Patient Account Number: [id number] Date of Birth/Sex: December 09, 1945 (72 y.o. F) Treating RN: Curtis Sites Primary Care Tahara Ruffini: Sherrie Mustache Other Clinician: Referring Sarabella Caprio: DASHER, DAVID Treating Cristela Stalder/Extender: STONE III, HOYT Weeks in Treatment: 2 Active Problems  Location of Pain Severity and Description of Pain Patient Has Paino Yes Site Locations Pain Location: Pain in Ulcers With Dressing Change: Yes Duration of the Pain. Constant / Intermittento Constant Pain Management and  Medication Current Pain Management: Electronic Signature(s) Signed: 05/26/2018 5:07:08 PM By: Curtis Sites Entered By: Curtis Sites on 05/26/2018 10:59:24 Nicholl, Jonel AMarland Kitchen ([id number]) -------------------------------------------------------------------------------- Wound Assessment Details Patient Name: [name] Date of Service: 05/26/2018 10:30 AM Medical Record Number: [id number] Patient Account Number: [id number] Date of Birth/Sex: 04-Feb-1946 (72 y.o. F) Treating RN: Curtis Sites Primary Care Oriana Horiuchi: Sherrie Mustache Other Clinician: Referring Glada Wickstrom: DASHER, DAVID Treating Jacquilyn Seldon/Extender: Linwood Dibbles, HOYT Weeks in Treatment: 2 Wound Status Wound Number: 3 Primary Etiology: Vasculitis Wound Location: Right Lower Leg - Posterior Secondary Diabetic Wound/Ulcer of the Lower Etiology: Extremity Wounding Event: Gradually Appeared Wound Status: Open Date Acquired: 03/12/2018 Comorbid History: Hypertension, Type II Diabetes, Weeks Of Treatment: 2 Neuropathy Clustered Wound: No Photos Photo Uploaded By: Curtis Sites on 05/26/2018 11:29:24 Wound Measurements Length: (cm) 2.3 Width: (cm) 1.6 Depth: (cm) 0.2 Area: (cm) 2.89 Volume: (cm) 0.578 % Reduction in Area: -172.6% % Reduction in Volume: -445.3% Epithelialization: Small (1-33%) Tunneling: No Undermining: No Wound Description Full Thickness Without Exposed Support Classification: Structures Wound Margin: Flat and Intact Exudate Large Amount: Exudate Type: Serous Exudate Color: amber Foul Odor After Cleansing: No Slough/Fibrino Yes Wound Bed Granulation Amount: Large (67-100%) Exposed Structure Granulation Quality: Pink Fascia Exposed: No Necrotic Amount: Small (1-33%) Fat Layer (Subcutaneous Tissue) Exposed: Yes Necrotic Quality: Adherent Slough Tendon Exposed: No Muscle Exposed: No Joint Exposed: No Bone Exposed: No Bardin, Oletta A. ([id number]) Periwound Skin Texture Texture Color No  Abnormalities Noted: No No Abnormalities Noted: No Callus: No Atrophie Blanche: No Crepitus: No Cyanosis: No Excoriation: No Ecchymosis: No Induration: No Erythema: No Rash: No Hemosiderin Staining: Yes Scarring: No Mottled: No Pallor: No Moisture Rubor: No No Abnormalities Noted: No Dry / Scaly: No Temperature / Pain Maceration: Yes Tenderness on Palpation: Yes Wound Preparation Ulcer Cleansing: Rinsed/Irrigated with Saline Topical Anesthetic Applied: Other: lidocaine 4%, Electronic Signature(s) Signed: 05/26/2018 5:07:08 PM By: Curtis Sites Entered By: Curtis Sites on 05/26/2018 11:04:55 Degrasse, Laurencia AMarland Kitchen ([id number]) -------------------------------------------------------------------------------- Wound Assessment Details Patient Name: [name] Date of Service: 05/26/2018 10:30 AM Medical Record Number: [id number] Patient Account Number: [id number] Date of Birth/Sex: 1946-01-02 (72 y.o. F) Treating RN: Curtis Sites Primary Care Lamere Lightner: Sherrie Mustache Other Clinician: Referring Izaiyah Kleinman: DASHER, DAVID Treating Tabytha Gradillas/Extender: Linwood Dibbles, HOYT Weeks in Treatment: 2 Wound Status Wound Number: 4 Primary Etiology: Vasculitis Wound Location: Right Malleolus - Medial Secondary Diabetic Wound/Ulcer of the Lower Etiology: Extremity Wounding Event: Gradually Appeared Wound Status: Open Date Acquired: 03/12/2018 Comorbid History: Hypertension, Type II Diabetes, Weeks Of Treatment: 2 Neuropathy Clustered Wound: Yes Photos Photo Uploaded By: Curtis Sites on 05/26/2018 11:29:25 Wound Measurements Length: (cm) 9.8 Width: (cm) 1.6 Depth: (cm) 0.1 Clustered Quantity: 5 Area: (cm) 12.315 Volume: (cm) 1.232 % Reduction in Area: 32.1% % Reduction in Volume: 66.1% Epithelialization: Large (67-100%) Tunneling: No Undermining: No Wound Description Full Thickness Without Exposed Support Classification: Structures Wound Margin: Flat and  Intact Exudate Large Amount: Exudate Type: Serous Exudate Color: amber Foul Odor After Cleansing: No Slough/Fibrino Yes Wound Bed Granulation Amount: Small (1-33%) Exposed Structure Granulation Quality: Pink Fascia Exposed: No Necrotic Amount: Large (67-100%) Fat Layer (Subcutaneous Tissue) Exposed: Yes Necrotic Quality: Adherent Slough Tendon Exposed: No Muscle Exposed: No Joint Exposed: No Linam, Thana A. ([id number]) Bone Exposed: No Periwound Skin Texture Texture Color No Abnormalities  Noted: No No Abnormalities Noted: No Callus: No Atrophie Blanche: No Crepitus: No Cyanosis: No Excoriation: No Ecchymosis: No Induration: Yes Erythema: Yes Rash: No Erythema Location: Circumferential Scarring: No Hemosiderin Staining: No Mottled: No Moisture Pallor: No No Abnormalities Noted: No Rubor: No Dry / Scaly: No Maceration: No Temperature / Pain Tenderness on Palpation: Yes Wound Preparation Ulcer Cleansing: Rinsed/Irrigated with Saline Topical Anesthetic Applied: Other: lidocaine 4%, Electronic Signature(s) Signed: 05/26/2018 5:07:08 PM By: Curtis Sites Entered By: Curtis Sites on 05/26/2018 11:06:01 [place]ella, Licet AMarland Kitchen ([id number]) -------------------------------------------------------------------------------- Vitals Details Patient Name: [name] Date of Service: 05/26/2018 10:30 AM Medical Record Number: [id number] Patient Account Number: [id number] Date of Birth/Sex: 12-22-1945 (72 y.o. F) Treating RN: Curtis Sites Primary Care Singleton Hickox: Sherrie Mustache Other Clinician: Referring Shawna Wearing: DASHER, DAVID Treating Dodi Leu/Extender: Linwood Dibbles, HOYT Weeks in Treatment: 2 Vital Signs Time Taken: 10:59 Temperature (F): 98.1 Height (in): 70 Pulse (bpm): 64 Weight (lbs): 193 Respiratory Rate (breaths/min): 18 Body Mass Index (BMI): 27.7 Blood Pressure (mmHg): 135/66 Reference Range: 80 - 120 mg / dl Electronic Signature(s) Signed: 05/26/2018  5:07:08 PM By: Curtis Sites Entered By: Curtis Sites on 05/26/2018 11:02:52

## 2018-06-05 NOTE — Progress Notes (Signed)
[name], [name] ([id number]) Visit Report for 05/26/2018 Chief Complaint Document Details Patient Name: [name], [name] Date of Service: 05/26/2018 10:30 AM Medical Record Number: [id number] Patient Account Number: [id number] Date of Birth/Sex: 1946-08-20 (72 y.o. F) Treating RN: Phillis Haggis Primary Care Provider: Sherrie Mustache Other Clinician: Referring Provider: Adolphus Birchwood, DAVID Treating Provider/Extender: Linwood Dibbles, HOYT Weeks in Treatment: 2 Information Obtained from: Patient Chief Complaint RLE wounds Electronic Signature(s) Signed: 05/27/2018 12:11:53 PM By: Lenda Kelp PA-C Entered By: Lenda Kelp on 05/26/2018 11:00:41 [name] Kitchen ([id number]) -------------------------------------------------------------------------------- HPI Details Patient Name: Marcia [name] Date of Service: 05/26/2018 10:30 AM Medical Record Number: [id number] Patient Account Number: [id number] Date of Birth/Sex: [birth year] (72 y.o. F) Treating RN: Phillis Haggis Primary Care Provider: Sherrie Mustache Other Clinician: Referring Provider: DASHER, DAVID Treating Provider/Extender: Linwood Dibbles, HOYT Weeks in Treatment: 2 History of Present Illness HPI Description: 05/12/18-She is seen in initial evaluation for multiple open areas to the right lower extremity. She was recently hospitalized (7/30-8/2) with cellulitis of the right leg, failed outpatient therapy with Keflex. While hospitalized she was treated with IV vancomycin and cefepime; right foot x-ray was negative for osteomyelitis. She admits to intermittent pain, improved since hospitalization. She was discharged on doxycycline which she should complete with the next 48 hours. 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 She has a remote history of following with vascular medicine for venous reflux, she does not tolerate wearing compression stockings d/t  skin sensitivities and "burning" sensation. In office ABI are elevated but she has strongly palpable pulses. We will initiate hydrofera blue and ace wrap compression. She is a diabetic with A1c 6.9 while hospitalized. 05/19/18- She is seen in follow up evaluation for RLE wounds; the lateral area is healed. She did tolerate ace wrap for compression, but experienced increased xerosis. We will hold off on ace wrap and increase lachydrin to twice daily. She c/o pain to the medial cluster of wounds, there is no apparent infection and will initiate steroid cream. She will follow up next week 05/26/18 on evaluation today patient appears to actually be having some issues currently with pain. She has been utilizing the dressings as previously recommended for the lower extremities. With that being said she does appear to have a little bit of a rash she states that anytime it he's in for anything else touches her skin that she will often develop a rash. She's not even sure that should be able to tolerate a compression wrap due to the fact that again she would be concerned about even the cotton layer being in contact with her skin which overtime will cause a burning sensation. She has seen dermatology concerning this unfortunately they really did not have a good answer for her as to why this is occurring. Fortunately there is no evidence of infection at this time. Electronic Signature(s) Signed: 05/27/2018 12:11:53 PM By: Lenda Kelp PA-C Entered By: Lenda Kelp on 05/27/2018 08:29:53 [name] ([id number]) -------------------------------------------------------------------------------- Physical Exam Details Patient Name: Marcia [name] Date of Service: 05/26/2018 10:30 AM Medical Record Number: [id number] Patient Account Number: [id number] Date of Birth/Sex: Dec 01, 1945 (72 y.o. F) Treating RN: Phillis Haggis Primary Care Provider: Sherrie Mustache Other Clinician: Referring Provider: DASHER,  DAVID Treating Provider/Extender: STONE III, HOYT Weeks in Treatment: 2 Constitutional Well-nourished and well-hydrated in no acute distress. Respiratory normal breathing without difficulty. clear to auscultation bilaterally. Cardiovascular regular rate  and rhythm with normal S1, S2. 2+ dorsalis pedis/posterior tibialis pulses. 1+ pitting edema of the bilateral lower extremities. Psychiatric this patient is able to make decisions and demonstrates good insight into disease process. Alert and Oriented x 3. pleasant and cooperative. Notes Patient continues to have issues with ulcerations again and the surface of the wounds really does not appear to be as good as I would like but again I think a big portion of this is the fluid management to control which is not optimal at this point. Electronic Signature(s) Signed: 05/27/2018 12:11:53 PM By: Lenda Kelp PA-C Entered By: Lenda Kelp on 05/27/2018 08:31:02 [name] ([id number]) -------------------------------------------------------------------------------- Physician Orders Details Patient Name: Marcia [name] Date of Service: 05/26/2018 10:30 AM Medical Record Number: [id number] Patient Account Number: [id number] Date of Birth/Sex: [birth year] (72 y.o. 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 o Cleanse wound with mild soap and  water Wound #4 Right,Medial Malleolus o Clean wound with Normal Saline. o Cleanse wound with mild soap and water Anesthetic (add to Medication List) Wound #3 Right,Posterior Lower Leg o Topical Lidocaine 4% cream applied to wound bed prior to debridement (In Clinic Only). Wound #4 Right,Medial Malleolus o Topical Lidocaine 4% cream applied to wound bed prior to debridement (In Clinic Only). Skin Barriers/Peri-Wound Care Wound #3 Right,Posterior Lower Leg o Triamcinolone Acetonide Ointment (TCA) Wound #4 Right,Medial Malleolus o Triamcinolone Acetonide Ointment (TCA) Primary Wound Dressing Wound #3 Right,Posterior Lower Leg o Silver Alginate Wound #4 Right,Medial Malleolus o Silver Alginate Secondary Dressing Wound #3 Right,Posterior Lower Leg o ABD pad o Dry Gauze Bensch, Noralee A. ([id number]) Wound #4 Right,Medial Malleolus o ABD pad o Dry Gauze Dressing Change Frequency Wound #3 Right,Posterior Lower Leg o Other: - 2 times week Wound #4 Right,Medial Malleolus o Other: - 2 times a week Follow-up Appointments Wound #3 Right,Posterior Lower Leg o Return Appointment in 1 week. o Return Appointment in: - Friday 05/29/18 nurse visit o Nurse Visit as needed Wound #4 Right,Medial Malleolus o Return Appointment in 1 week. o Return Appointment in: - Friday 05/29/18 nurse visit o Nurse Visit as needed Edema Control Wound #3 Right,Posterior Lower Leg o Unna Boot to Right Lower Extremity o Elevate legs to the level of the heart and pump ankles as often as possible Wound #4 Right,Medial Malleolus o Unna Boot to Right Lower Extremity o Elevate legs to the level of the heart and pump ankles as often as possible Additional Orders / Instructions Wound #3 Right,Posterior Lower Leg o Increase protein intake. Wound #4 Right,Medial Malleolus o Increase protein intake. Consults o Pain Clinic Patient Medications Allergies: No Known  Allergies Notifications Medication Indication Start End lidocaine DOSE 1 - topical 4 % cream - 1 cream topical Electronic Signature(s) Signed: 05/27/2018 12:11:53 PM By: Corinne Ports, Glessie A. ([id number]) Signed: 05/27/2018 4:43:18 PM By: Alejandro Mulling Entered By: Alejandro Mulling on 05/26/2018 11:42:34 Hanlon, Cassanda [name] Kitchen ([id number]) -------------------------------------------------------------------------------- Prescription 05/26/2018 Patient Name: Marcia [name] Provider: Linwood Dibbles, HOYT PA-C  Date of Birth: 06/22/1946 NPI#: [id number] Sex: F DEA#: [id number] Phone #: [phone] License #: Patient Address: [facility] [address] [facility] [place], [place] [address] [address] [place], [place] [address] [phone] Allergies No Known Allergies Medication Medication: [name]: Strength: Form: lidocaine 4 % topical cream topical 4% cream Class: TOPICAL LOCAL ANESTHETICS Dose: Frequency / Time: Indication: 1 1 cream topical Number of Refills: Number of Units: 0 Generic Substitution: Start Date: End Date: One Time Use: Substitution Permitted No Note to Pharmacy: Signature(s): Date(s): Electronic Signature(s) Signed: 05/27/2018 12:11:53 PM By: Lenda Kelp PA-C Signed: 05/27/2018 4:43:18 PM By: Alejandro Mulling Entered By: Alejandro Mulling on 05/26/2018 11:42:35 Diener, Shirl [name] Kitchen ([id number]) --------------------------------------------------------------------------------  Problem List Details Patient Name: Marcia [name] Date of Service: 05/26/2018 10:30 AM Medical Record Number: [id number] Patient Account Number: [id number] Date of Birth/Sex: 11/17/1945 (72 y.o. 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 05/12/2018 No Yes L97.212 Non-pressure chronic ulcer of right calf with fat layer exposed 05/12/2018 No Yes L97.312 Non-pressure chronic ulcer of right ankle with fat layer 05/12/2018 No Yes exposed E11.622 Type 2 diabetes mellitus with other skin ulcer 05/12/2018 No Yes Inactive Problems Resolved Problems Electronic Signature(s) Signed: 05/27/2018 12:11:53 PM By: Lenda Kelp PA-C Entered By: Lenda Kelp on 05/26/2018 11:00:36 Decelles, Kaelyn [name] Kitchen ([id number]) -------------------------------------------------------------------------------- Progress Note Details Patient Name: Marcia [name] Date of Service: 05/26/2018 10:30 AM Medical Record Number: [id number] Patient Account Number: [id number] Date of Birth/Sex: Feb 29, 1946 (72 y.o. F) Treating RN: Phillis Haggis Primary Care Provider: Sherrie Mustache Other Clinician: Referring Provider: DASHER, DAVID Treating Provider/Extender: Linwood Dibbles, HOYT Weeks in Treatment: 2 Subjective Chief Complaint Information obtained from Patient RLE wounds History of Present Illness (HPI) 05/12/18-She is seen in initial evaluation for multiple open areas to the right lower extremity. She was recently hospitalized (7/30-8/2) with cellulitis of the right leg, failed outpatient therapy with Keflex. While hospitalized she was treated with IV vancomycin and cefepime; right foot x-ray was negative for osteomyelitis. She admits to intermittent pain, improved since hospitalization. She was discharged on doxycycline which she should complete with the next 48 hours. 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 She has a remote history of following with vascular medicine for venous reflux, she does not tolerate wearing compression stockings d/t skin sensitivities and "burning" sensation. In office ABI  are elevated but she has strongly palpable pulses. We will initiate hydrofera blue and ace wrap compression. She is a diabetic with A1c 6.9 while hospitalized. 05/19/18- She is seen in follow up evaluation for RLE wounds; the lateral area is healed. She did tolerate ace wrap for compression, but experienced increased xerosis. We will hold off on ace wrap and increase lachydrin to twice daily. She c/o pain to the medial cluster of wounds, there is no apparent infection and will initiate steroid cream. She will follow up next week 05/26/18 on evaluation today patient appears to actually be having some issues currently with pain. She has been utilizing the dressings as previously recommended for the lower extremities. With that being said she does appear to have a little bit of a rash she states that anytime it he's in for anything else  touches her skin that she will often develop a rash. She's not even sure that should be able to tolerate a compression wrap due to the fact that again she would be concerned about even the cotton layer being in contact with her skin which overtime will cause a burning sensation. She has seen dermatology concerning this unfortunately they really did not have a good answer for her as to why this is occurring. Fortunately there is no evidence of infection at this time. Patient History Information obtained from Patient. Family History Cancer - Siblings, Diabetes - Mother,Father,Siblings, Heart Disease - Father, Hypertension - Father, Stroke - Father,Mother, No family history of Kidney Disease, Lung Disease, Seizures, Thyroid Problems, Tuberculosis. Social History Former smoker - 10-15 years quit, Marital Status - Married, Alcohol Use - Never, Drug Use - No History, Caffeine Use - Daily. Medical And Surgical History Notes Constitutional Symptoms (General Health) vitamin d deficiency Respiratory nodule of lung Cardiovascular dyspnea hyperlipedema had vascular work up  in 2011, [place] Musculoskeletal arthritis Driver, Angeligue A. ([id number]) Review of Systems (ROS) Constitutional Symptoms (General Health) Denies complaints or symptoms of Fever, Chills. Respiratory The patient has no complaints or symptoms. Cardiovascular Complains or has symptoms of LE edema. Psychiatric The patient has no complaints or symptoms. Objective Constitutional Well-nourished and well-hydrated in no acute distress. Vitals Time Taken: 10:59 AM, Height: 70 in, Weight: 193 lbs, BMI: 27.7, Temperature: 98.1 F, Pulse: 64 bpm, Respiratory Rate: 18 breaths/min, Blood Pressure: 135/66 mmHg. Respiratory normal breathing without difficulty. clear to auscultation bilaterally. Cardiovascular regular rate and rhythm with normal S1, S2. 2+ dorsalis pedis/posterior tibialis pulses. 1+ pitting edema of the bilateral lower extremities. Psychiatric this patient is able to make decisions and demonstrates good insight into disease process. Alert and Oriented x 3. pleasant and cooperative. General Notes: Patient continues to have issues with ulcerations again and the surface of the wounds really does not appear to be as good as I would like but again I think a big portion of this is the fluid management to control which is not optimal at this point. Integumentary (Hair, Skin) Wound #3 status is Open. Original cause of wound was Gradually Appeared. The wound is located on the Right,Posterior Lower Leg. The wound measures 2.3cm length x 1.6cm width x 0.2cm depth; 2.89cm^2 area and 0.578cm^3 volume. There is Fat Layer (Subcutaneous Tissue) Exposed exposed. There is no tunneling or undermining noted. There is a large amount of serous drainage noted. The wound margin is flat and intact. There is large (67-100%) pink granulation within the wound bed. There is a small (1-33%) amount of necrotic tissue within the wound bed including Adherent Slough. The periwound skin appearance exhibited: Maceration,  Hemosiderin Staining. The periwound skin appearance did not exhibit: Callus, Crepitus, Excoriation, Induration, Rash, Scarring, Dry/Scaly, Atrophie Blanche, Cyanosis, Ecchymosis, Mottled, Pallor, Rubor, Erythema. The periwound has tenderness on palpation. Wound #4 status is Open. Original cause of wound was Gradually Appeared. The wound is located on the Right,Medial Malleolus. The wound measures 9.8cm length x 1.6cm width x 0.1cm depth; 12.315cm^2 area and 1.232cm^3 volume. There is Fat Layer (Subcutaneous Tissue) Exposed exposed. There is no tunneling or undermining noted. There is a large amount of serous drainage noted. The wound margin is flat and intact. There is small (1-33%) pink granulation within the wound bed. There is a large (67-100%) amount of necrotic tissue within the wound bed including Adherent Slough. The periwound skin Parchment, Meline A. ([id number]) appearance exhibited: Induration, Erythema. The periwound skin appearance did  not exhibit: Callus, Crepitus, Excoriation, Rash, Scarring, Dry/Scaly, Maceration, Atrophie Blanche, Cyanosis, Ecchymosis, Hemosiderin Staining, Mottled, Pallor, Rubor. The surrounding wound skin color is noted with erythema which is circumferential. The periwound has tenderness on palpation. 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 Cleanse wound with mild soap and water Wound #4 Right,Medial Malleolus: Clean wound with Normal Saline. Cleanse wound with mild soap and water Anesthetic (add to Medication List): Wound #3 Right,Posterior Lower Leg: Topical Lidocaine 4% cream applied to wound bed prior to debridement (In Clinic Only). Wound #4 Right,Medial Malleolus: Topical  Lidocaine 4% cream applied to wound bed prior to debridement (In Clinic Only). Skin Barriers/Peri-Wound Care: Wound #3 Right,Posterior Lower Leg: Triamcinolone Acetonide Ointment (TCA) Wound #4 Right,Medial Malleolus: Triamcinolone Acetonide Ointment (TCA) Primary Wound Dressing: Wound #3 Right,Posterior Lower Leg: Silver Alginate Wound #4 Right,Medial Malleolus: Silver Alginate Secondary Dressing: Wound #3 Right,Posterior Lower Leg: ABD pad Dry Gauze Wound #4 Right,Medial Malleolus: ABD pad Dry Gauze Dressing Change Frequency: Wound #3 Right,Posterior Lower Leg: Other: - 2 times week Wound #4 Right,Medial Malleolus: Paulhus, Chele A. ([id number]) Other: - 2 times a week Follow-up Appointments: Wound #3 Right,Posterior Lower Leg: Return Appointment in 1 week. Return Appointment in: - Friday 05/29/18 nurse visit Nurse Visit as needed Wound #4 Right,Medial Malleolus: Return Appointment in 1 week. Return Appointment in: - Friday 05/29/18 nurse visit Nurse Visit as needed Edema Control: Wound #3 Right,Posterior Lower Leg: Unna Boot to Right Lower Extremity Elevate legs to the level of the heart and pump ankles as often as possible Wound #4 Right,Medial Malleolus: Unna Boot to Right Lower Extremity Elevate legs to the level of the heart and pump ankles as often as possible Additional Orders / Instructions: Wound #3 Right,Posterior Lower Leg: Increase protein intake. Wound #4 Right,Medial Malleolus: Increase protein intake. Consults ordered were: Pain Clinic The following medication(s) was prescribed: lidocaine topical 4 % cream 1 1 cream topical was prescribed at facility At this point my suggestion for the patient was that we do two things for her. Number one was that we see about initiating a compression wrap utilizing an [employer] since this may help with the [place] mine to suit some of her skin issues that she experiences. Secondly I am gonna make a referral as well to pain  management to see about short-term help with controlling her pain secondary to these ulcers. I explained again that we do not prescribe pain medications she has taken hydrocodone with good result as prescribed by her primary care provider unfortunately they are not wanting to continue this at this point according to the patient. For that reason I do think a pain management referral for short-term help with managing her pain while we hopefully get these ulcers healed rather rapidly would be of great benefit for her. She's in agreement that plan. We will subsequently see her back for reevaluation in one weeks time to see were things stand. Please see above for specific wound care orders. We will see patient for re-evaluation in 1 week(s) here in the clinic. If anything worsens or changes patient will contact our office for additional recommendations. Electronic Signature(s) Signed: 05/27/2018 12:11:53 PM By: Lenda Kelp PA-C Entered By: Lenda Kelp on 05/27/2018 08:31:58 Offord, Staphany [name] Kitchen ([id number]) -------------------------------------------------------------------------------- ROS/PFSH Details  Patient Name: LUCILE, HILLMANN A. Date of Service: 05/26/2018 10:30 AM Medical Record Number: [id number] Patient Account Number: [id number] Date of Birth/Sex: 1946-01-29 (72 y.o. F) Treating RN: Phillis Haggis Primary Care Provider: Sherrie Mustache Other Clinician: Referring Provider: DASHER, DAVID Treating Provider/Extender: Linwood Dibbles, HOYT Weeks in Treatment: 2 Information Obtained From Patient Wound History Do you currently have one or more open woundso Yes How many open wounds do you currently haveo 5 Approximately how long have you had your woundso 6 How have you been treating your wound(s) until nowo abx Has your wound(s) ever healed and then re-openedo No Have you had any lab work done in the past montho Yes Who ordered the lab work doneo hospital Have you tested positive for an antibiotic  resistant organism (MRSA, VRE)o No Have you tested positive for osteomyelitis (bone infection)o No Have you had any tests for circulation on your legso Yes Constitutional Symptoms (General Health) Complaints and Symptoms: Negative for: Fever; Chills Medical History: Past Medical History Notes: vitamin d deficiency Cardiovascular Complaints and Symptoms: Positive for: LE edema Medical History: Positive for: Hypertension Negative for: Angina; Arrhythmia; Congestive Heart Failure; Coronary Artery Disease; Deep Vein Thrombosis; Hypotension; Myocardial Infarction; Peripheral Arterial Disease; Peripheral Venous Disease; Phlebitis; Vasculitis Past Medical History Notes: dyspnea 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, [place] Eyes Medical History: Negative for: Cataracts; Glaucoma; Optic Neuritis Ear/Nose/Mouth/Throat Medical History: Negative for: Chronic sinus problems/congestion; Middle ear problems Hematologic/Lymphatic SANAM, MARMO A. ([id number]) Medical History: Negative for: Anemia; Hemophilia; Human Immunodeficiency Virus; Lymphedema; Sickle Cell Disease Respiratory Complaints and Symptoms: No Complaints or Symptoms Medical History: Negative for: Aspiration; Asthma; Chronic Obstructive Pulmonary Disease (COPD); Pneumothorax; Sleep Apnea; Tuberculosis Past Medical History Notes: nodule of lung Gastrointestinal Medical History: Negative for: Cirrhosis ; Colitis; Crohnos; Hepatitis A; Hepatitis B; Hepatitis C Endocrine Medical History: Positive for: Type II Diabetes Time with diabetes: 10 years Treated with: Oral agents Blood sugar tested every day: Yes Tested : every mornimg Genitourinary Medical History: Negative for: End Stage Renal Disease Immunological Medical History: Negative for: Lupus Erythematosus; Raynaudos; Scleroderma Integumentary (Skin) Medical History: Negative for: History of Burn; History of pressure wounds Musculoskeletal Medical  History: Negative for: Gout; Rheumatoid Arthritis; Osteoarthritis; Osteomyelitis Past Medical History Notes: arthritis Neurologic Medical History: Positive for: Neuropathy - peripheral Negative for: Dementia; Quadriplegia; Paraplegia; Seizure Disorder Oncologic TOY, SAMARIN ([id number]) Medical History: Negative for: Received Chemotherapy; Received Radiation Psychiatric Complaints and Symptoms: No Complaints or Symptoms Medical History: Negative for: Anorexia/bulimia; Confinement Anxiety Immunizations Pneumococcal Vaccine: Received Pneumococcal Vaccination: Yes Tetanus Vaccine: Last tetanus shot: 05/12/2014 Immunization Notes: plan on flu shot Implantable Devices Family and Social History Cancer: Yes - Siblings; Diabetes: Yes - Mother,Father,Siblings; Heart Disease: Yes - Father; Hypertension: Yes - Father; Kidney Disease: No; Lung Disease: No; Seizures: No; Stroke: Yes - Father,Mother; Thyroid Problems: No; Tuberculosis: No; Former smoker - 10-15 years quit; Marital Status - Married; Alcohol Use: Never; Drug Use: No History; Caffeine Use: Daily; Financial Concerns: No; Food, Clothing or Shelter Needs: No; Support System Lacking: No; Transportation Concerns: No; Advanced Directives: No; Patient does not want information on Advanced Directives; Living Will: No; Medical Power of Attorney: No Physician Affirmation I have reviewed and agree with the above information. Electronic Signature(s) Signed: 05/27/2018 12:11:53 PM By: Lenda Kelp PA-C Signed: 05/27/2018 4:43:18 PM By: Alejandro Mulling Entered By: Lenda Kelp on 05/27/2018 08:30:21 Lightle, Shawn [name] ([id number]) -------------------------------------------------------------------------------- SuperBill Details Patient Name: Marcia [name] Date of Service: 05/26/2018 Medical Record Number: [id number] Patient Account Number: [id number]  Date of Birth/Sex: 20-Oct-1945 (72 y.o. 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 mellitus with other skin ulcer Facility Procedures CPT4 Code: 77939030 Description: (Facility Use Only) 09233AQ - Joetta Manners BOOT RT Modifier: Quantity: 1 Physician Procedures CPT4 Code: 7622633 Description: 99214 - WC PHYS LEVEL 4 - EST PT ICD-10 Diagnosis Description 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

## 2018-06-06 ENCOUNTER — Ambulatory Visit
Admission: RE | Admit: 2018-06-06 | Discharge: 2018-06-06 | Disposition: A | Discharge status: Home / Self Care | Payer: Medicare HMO | Source: Ambulatory Visit | Attending: Physician Assistant | Admitting: Physician Assistant

## 2018-06-06 DIAGNOSIS — T148XXA Other injury of unspecified body region, initial encounter: Secondary | ICD-10-CM | POA: Insufficient documentation

## 2018-06-06 DIAGNOSIS — L97419 Non-pressure chronic ulcer of right heel and midfoot with unspecified severity: Secondary | ICD-10-CM | POA: Diagnosis not present

## 2018-06-06 DIAGNOSIS — L97312 Non-pressure chronic ulcer of right ankle with fat layer exposed: Secondary | ICD-10-CM

## 2018-06-06 DIAGNOSIS — L950 Livedoid vasculitis: Secondary | ICD-10-CM

## 2018-06-06 DIAGNOSIS — L97212 Non-pressure chronic ulcer of right calf with fat layer exposed: Secondary | ICD-10-CM

## 2018-06-06 DIAGNOSIS — X58XXXA Exposure to other specified factors, initial encounter: Secondary | ICD-10-CM | POA: Insufficient documentation

## 2018-06-06 NOTE — Progress Notes (Signed)
[name], [name] ([id number]) Visit Report for 05/29/2018 Arrival Information Details Patient Name: [name], [name] Date of Service: 05/29/2018 9:30 AM Medical Record Number: [id number] Patient Account Number: [id number] Date of Birth/Sex: [birth year] (72 y.o. F) Treating RN: Phillis Haggis Primary Care [name]: [name] Other Clinician: Referring [name] Treating Breeona Waid/Extender: [name] Weeks in Treatment: 2 Visit Information History Since Last Visit All ordered tests and consults were completed: No Patient Arrived: Ambulatory Added or deleted any medications: No Arrival Time: 09:49 Any new allergies or adverse reactions: No Accompanied By: self Had a fall or experienced change in No Transfer Assistance: None activities of daily living that may affect Patient Identification Verified: Yes risk of falls: Secondary Verification Process Completed: Yes Signs or symptoms of abuse/neglect since last visito No Patient Requires Transmission-Based No Hospitalized since last visit: No Precautions: Implantable device outside of the clinic excluding No Patient Has Alerts: No cellular tissue based products placed in the center since last visit: Has Dressing in Place [name] Prescribed: Yes Has Compression in Place [name] Prescribed: Yes Pain Present Now: Yes Electronic Signature(s) Signed: 05/29/2018 3:54:45 PM By: [name] Entered By: [name] on 05/29/2018 09:51:13 [name] ([id number]) -------------------------------------------------------------------------------- Clinic Level of Care Assessment Details Patient Name: Marcia [name] Date of Service: 05/29/2018 9:30 AM Medical Record Number: [id number] Patient Account Number: [id number] Date of Birth/Sex: [birth year] (72 y.o. F) Treating RN: [name] Primary Care Heydi Swango: [name] Other Clinician: Referring Braylinn Gulden: [name] Treating Arisha Gervais/Extender: [name] Weeks  in Treatment: 2 Clinic Level of Care Assessment Items TOOL 4 Quantity Score []  - Use when only an EandM is performed on FOLLOW-UP visit 0 ASSESSMENTS - Nursing Assessment / Reassessment X - Reassessment of Co-morbidities (includes updates in patient status) 1 10 X- 1 5 Reassessment of Adherence to Treatment Plan ASSESSMENTS - Wound and Skin Assessment / Reassessment []  - Simple Wound Assessment / Reassessment - one wound 0 X- 2 5 Complex Wound Assessment / Reassessment - multiple wounds []  - 0 Dermatologic / Skin Assessment (not related to wound area) ASSESSMENTS - Focused Assessment X - Circumferential Edema Measurements - multi extremities 1 5 []  - 0 Nutritional Assessment / Counseling / Intervention X- 1 5 Lower Extremity Assessment (monofilament, tuning fork, pulses) []  - 0 Peripheral Arterial Disease Assessment (using hand held doppler) ASSESSMENTS - Ostomy and/or Continence Assessment and Care []  - Incontinence Assessment and Management 0 []  - 0 Ostomy Care Assessment and Management (repouching, etc.) PROCESS - Coordination of Care X - Simple Patient / Family Education for ongoing care 1 15 []  - 0 Complex (extensive) Patient / Family Education for ongoing care X- 1 10 Staff obtains Chiropractor, Records, Test Results / Process Orders []  - 0 Staff telephones HHA, Nursing Homes / Clarify orders / etc []  - 0 Routine Transfer to another Facility (non-emergent condition) []  - 0 Routine Hospital Admission (non-emergent condition) []  - 0 New Admissions / Manufacturing engineer / Ordering NPWT, Apligraf, etc. []  - 0 Emergency Hospital Admission (emergent condition) X- 1 10 Simple Discharge Coordination [name], [name] ([id number]) []  - 0 Complex (extensive) Discharge Coordination PROCESS - Special Needs []  - Pediatric / Minor Patient Management 0 []  - 0 Isolation Patient Management []  - 0 Hearing / Language / Visual special needs []  - 0 Assessment of Community  assistance (transportation, D/C planning, etc.) []  - 0 Additional assistance / Altered mentation []  - 0 Support Surface(s) Assessment (bed, cushion, seat, etc.) INTERVENTIONS - Wound Cleansing / Measurement []  -  Simple Wound Cleansing - one wound 0 X- 2 5 Complex Wound Cleansing - multiple wounds X- 1 5 Wound Imaging (photographs - any number of wounds) []  - 0 Wound Tracing (instead of photographs) []  - 0 Simple Wound Measurement - one wound X- 2 5 Complex Wound Measurement - multiple wounds INTERVENTIONS - Wound Dressings []  - Small Wound Dressing one or multiple wounds 0 []  - 0 Medium Wound Dressing one or multiple wounds X- 1 20 Large Wound Dressing one or multiple wounds []  - 0 Application of Medications - topical []  - 0 Application of Medications - injection INTERVENTIONS - Miscellaneous []  - External ear exam 0 X- 1 5 Specimen Collection (cultures, biopsies, blood, body fluids, etc.) []  - 0 Specimen(s) / Culture(s) sent or taken to Lab for analysis []  - 0 Patient Transfer (multiple staff / Nurse, adult / Similar devices) []  - 0 Simple Staple / Suture removal (25 or less) []  - 0 Complex Staple / Suture removal (26 or more) []  - 0 Hypo / Hyperglycemic Management (close monitor of Blood Glucose) []  - 0 Ankle / Brachial Index (ABI) - do not check if billed separately X- 1 5 Vital Signs [name], [name] ([id number]) Has the patient been seen at the hospital within the last three years: Yes Total Score: 125 Level Of Care: New/Established - Level 4 Electronic Signature(s) Signed: 05/29/2018 4:18:40 PM By: [name] Entered By: [name] on 05/29/2018 10:31:36 [name] ([id number]) -------------------------------------------------------------------------------- Encounter Discharge Information Details Patient Name: Marcia [name] Date of Service: 05/29/2018 9:30 AM Medical Record Number: [id number] Patient Account Number: [id number] Date of Birth/Sex:  1945/12/20 (72 y.o. F) Treating RN: [name] Primary Care [name]: [name] Other Clinician: Referring Kaelea Gathright: [name] Treating Marcia Asher/Extender: [name] Weeks in Treatment: 2 Encounter Discharge Information Items Discharge Condition: Stable Ambulatory Status: Ambulatory Discharge Destination: Home Transportation: Private Auto Accompanied By: self Schedule Follow-up Appointment: Yes Clinical Summary of Care: Electronic Signature(s) Signed: 05/29/2018 3:42:45 PM By: [name] Entered By: [name] on 05/29/2018 10:59:06 Hanna, Shawn [name] ([id number]) -------------------------------------------------------------------------------- Lower Extremity Assessment Details Patient Name: Marcia [name] Date of Service: 05/29/2018 9:30 AM Medical Record Number: [id number] Patient Account Number: [id number] Date of Birth/Sex: [birth year] (72 y.o. F) Treating RN: [name] Primary Care Silva Aamodt: [name] Other Clinician: Referring Ilka Lovick: [name] Treating Krystle Oberman/Extender: [name] Weeks in Treatment: 2 Edema Assessment Assessed: [Left: No] [Right: No] [Left: Edema] [Right: :] Calf Left: Right: Point of Measurement: 34 cm From Medial Instep cm 40.5 cm Ankle Left: Right: Point of Measurement: 12 cm From Medial Instep cm 22 cm Vascular Assessment Pulses: Dorsalis Pedis Palpable: [Right:Yes] Posterior Tibial Extremity colors, hair growth, and conditions: Extremity Color: [Right:Hyperpigmented] Hair Growth on Extremity: [Right:No] Temperature of Extremity: [Right:Hot] Capillary Refill: [Right:< 3 seconds] Electronic Signature(s) Signed: 05/29/2018 10:20:01 AM By: [name] Entered By: [name] on 05/29/2018 10:20:01 Scheffel, Shawn [name] ([id number]) -------------------------------------------------------------------------------- Multi Wound Chart Details Patient Name: Marcia [name] Date of Service: 05/29/2018 9:30 AM Medical Record  Number: [id number] Patient Account Number: [id number] Date of Birth/Sex: November 13, 1945 (72 y.o. F) Treating RN: [name] Primary Care Brendan Gruwell: [name] Other Clinician: Referring Sophiagrace Benbrook: [name] Treating Omarie Parcell/Extender: STONE III, [name] Weeks in Treatment: 2 Vital Signs Height(in): 70 Pulse(bpm): 81 Weight(lbs): 193 Blood Pressure(mmHg): 154/57 Body Mass Index(BMI): 28 Temperature(F): 98.1 Respiratory Rate 18 (breaths/min): Photos: [3:No Photos] [4:No Photos] [N/A:N/A] Wound Location: [3:Right Lower Leg - Posterior] [4:Right Malleolus - Medial] [N/A:N/A] Wounding Event: [3:Gradually Appeared] [4:Gradually  Appeared] [N/A:N/A] Primary Etiology: [3:Vasculitis] [4:Vasculitis] [N/A:N/A] 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] [N/A:N/A] Comorbid History: [3:Hypertension, Type II Diabetes, Neuropathy] [4:Hypertension, Type II Diabetes, Neuropathy] [N/A:N/A] Date Acquired: [3:03/12/2018] [4:03/12/2018] [N/A:N/A] Weeks of Treatment: [3:2] [4:2] [N/A:N/A] Wound Status: [3:Open] [4:Open] [N/A:N/A] Clustered Wound: [3:No] [4:Yes] [N/A:N/A] Clustered Quantity: [3:N/A] [4:5] [N/A:N/A] Measurements L x W x D [3:2x1.6x0.2] [4:10x1.8x0.2] [N/A:N/A] (cm) Area (cm) : [3:2.513] [4:14.137] [N/A:N/A] Volume (cm) : [3:0.503] [4:2.827] [N/A:N/A] % Reduction in Area: [3:-137.10%] [4:22.10%] [N/A:N/A] % Reduction in Volume: [3:-374.50%] [4:22.10%] [N/A:N/A] Classification: [3:Full Thickness Without Exposed Support Structures] [4:Full Thickness Without Exposed Support Structures] [N/A:N/A] Exudate Amount: [3:Large] [4:Large] [N/A:N/A] Exudate Type: [3:Serous] [4:Serous] [N/A:N/A] Exudate Color: [3:amber] [4:amber] [N/A:N/A] Wound Margin: [3:Flat and Intact] [4:Flat and Intact] [N/A:N/A] Granulation Amount: [3:Medium (34-66%)] [4:Medium (34-66%)] [N/A:N/A] Granulation Quality: [3:Pink] [4:Red, Pink]  [N/A:N/A] Necrotic Amount: [3:Medium (34-66%)] [4:Medium (34-66%)] [N/A:N/A] Exposed Structures: [3:Fat Layer (Subcutaneous Tissue) Exposed: Yes Fascia: No Tendon: No Muscle: No Joint: No Bone: No] [4:Fat Layer (Subcutaneous Tissue) Exposed: Yes Fascia: No Tendon: No Muscle: No Joint: No Bone: No] [N/A:N/A] Epithelialization: [3:Small (1-33%)] [4:Large (67-100%)] [N/A:N/A] Periwound Skin Texture: [N/A:N/A] Excoriation: No Induration: Yes Induration: No Excoriation: No Callus: No Callus: No Crepitus: No Crepitus: No Rash: No Rash: No Scarring: No Scarring: No Periwound Skin Moisture: Maceration: Yes Maceration: No N/A Dry/Scaly: No Dry/Scaly: No Periwound Skin Color: Erythema: Yes Erythema: Yes N/A Hemosiderin Staining: Yes Hemosiderin Staining: Yes Atrophie Blanche: No Atrophie Blanche: No Cyanosis: No Cyanosis: No Ecchymosis: No Ecchymosis: No Mottled: No Mottled: No Pallor: No Pallor: No Rubor: No Rubor: No Erythema Location: Circumferential Circumferential N/A Tenderness on Palpation: Yes Yes N/A Wound Preparation: Ulcer Cleansing: Ulcer Cleansing: N/A Rinsed/Irrigated with Saline, Rinsed/Irrigated with Saline, Other: soap and water Other: soap and water Topical Anesthetic Applied: Topical Anesthetic Applied: Other: lidocaine 4% Other: lidocaine 4% Treatment Notes Electronic Signature(s) Signed: 05/29/2018 10:20:13 AM By: [name] Entered By: [name] on 05/29/2018 10:20:12 Borntreger, Shawn [name] ([id number]) -------------------------------------------------------------------------------- Multi-Disciplinary Care Plan Details Patient Name: Marcia [name] Date of Service: 05/29/2018 9:30 AM Medical Record Number: [id number] Patient Account Number: [id number] Date of Birth/Sex: 11-13-1945 (72 y.o. F) Treating RN: [name] Primary Care Avereigh Spainhower: [name] Other Clinician: Referring Calee Nugent: [name] Treating Lance Galas/Extender: [name] Weeks in Treatment: 2 Active Inactive ` Abuse / Safety / Falls / Self Care Management Nursing Diagnoses: Potential for falls Goals: Patient will not experience any injury related to falls Date Initiated: 05/12/2018 Target Resolution Date: 08/15/2018 Goal Status: Active Interventions: Assess Activities of Daily Living upon admission and [name] needed Assess fall risk on admission and [name] needed Assess: immobility, friction, shearing, incontinence upon admission and [name] needed Assess impairment of mobility on admission and [name] needed per policy Assess personal safety and home safety ([name] indicated) on admission and [name] needed Notes: ` Nutrition Nursing Diagnoses: Imbalanced nutrition Impaired glucose control: actual or potential Goals: Patient/caregiver agrees to and verbalizes understanding of need to use nutritional supplements and/or vitamins [name] prescribed Date Initiated: 05/12/2018 Target Resolution Date: 09/12/2018 Goal Status: Active Patient/caregiver will maintain therapeutic glucose control Date Initiated: 05/12/2018 Target Resolution Date: 08/15/2018 Goal Status: Active Interventions: Assess patient nutrition upon admission and [name] needed per policy Provide education on elevated blood sugars and impact on wound healing Provide education on nutrition Notes: DEANN, MCLAINE ([id number]) Orientation to the Wound Care Program Nursing Diagnoses: Knowledge deficit related to the wound healing center program Goals: Patient/caregiver will verbalize understanding of  the Wound Healing Center Program Date Initiated: 05/12/2018 Target Resolution Date: 06/13/2018 Goal Status: Active Interventions: Provide education on orientation to the wound center Notes: ` Wound/Skin Impairment Nursing Diagnoses: Impaired tissue integrity Knowledge deficit related to ulceration/compromised skin integrity Goals: Ulcer/skin breakdown will have a volume reduction of 80% by week 12 Date  Initiated: 05/12/2018 Target Resolution Date: 09/05/2018 Goal Status: Active Interventions: Assess patient/caregiver ability to perform ulcer/skin care regimen upon admission and [name] needed Assess ulceration(s) every visit Notes: Electronic Signature(s) Signed: 05/29/2018 10:20:06 AM By: [name] Entered By: [name] on 05/29/2018 10:20:06 Lakins, Shawn [name] ([id number]) -------------------------------------------------------------------------------- Pain Assessment Details Patient Name: Marcia [name] Date of Service: 05/29/2018 9:30 AM Medical Record Number: [id number] Patient Account Number: [id number] Date of Birth/Sex: [birth year] (72 y.o. F) Treating RN: [name] Primary Care Shonique Pelphrey: [name] Other Clinician: Referring Alianna Wurster: [name] Treating Reighlyn Elmes/Extender: [name] Weeks in Treatment: 2 Active Problems Location of Pain Severity and Description of Pain Patient Has Paino Yes Site Locations Pain Location: Pain in Ulcers With Dressing Change: Yes Duration of the Pain. Constant / Intermittento Constant Pain Management and Medication Current Pain Management: Electronic Signature(s) Signed: 05/29/2018 10:19:06 AM By: [name] Entered By: [name] on 05/29/2018 10:19:05 Ola, Shawn [name] ([id number]) -------------------------------------------------------------------------------- Patient/Caregiver Education Details Patient Name: Marcia [name] Date of Service: 05/29/2018 9:30 AM Medical Record Number: [id number] Patient Account Number: [id number] Date of Birth/Gender: [birth year] (73 y.o. F) Treating RN: [name] Primary Care Physician: [name] Other Clinician: Referring Physician: DASHER, [name] Treating Physician/Extender: Skeet Simmer in Treatment: 2 Education Assessment Education Provided To: Patient Education Topics Provided Wound/Skin Impairment: Handouts: Caring for Your Ulcer Methods:  Explain/Verbal Responses: State content correctly Electronic Signature(s) Signed: 05/29/2018 3:42:45 PM By: [name] Entered By: [name] on 05/29/2018 10:59:29 Barcellos, Whittley AMarland Kitchen ([id number]) -------------------------------------------------------------------------------- Wound Assessment Details Patient Name: Marcia [name] Date of Service: 05/29/2018 9:30 AM Medical Record Number: [id number] Patient Account Number: [id number] Date of Birth/Sex: 07-07-1946 (72 y.o. F) Treating RN: [name] Primary Care Shannette Tabares: [name] Other Clinician: Referring Kyshawn Teal: [name] Treating Winnifred Dufford/Extender: [name] Weeks in Treatment: 2 Wound Status Wound Number: 3 Primary Etiology: Vasculitis Wound Location: Right Lower Leg - Posterior Secondary Diabetic Wound/Ulcer of the Lower Etiology: Extremity Wounding Event: Gradually Appeared Wound Status: Open Date Acquired: 03/12/2018 Comorbid History: Hypertension, Type II Diabetes, Weeks Of Treatment: 2 Neuropathy Clustered Wound: No Photos Photo Uploaded By: [name] on 05/29/2018 11:46:06 Wound Measurements Length: (cm) 2 Width: (cm) 1.6 Depth: (cm) 0.2 Area: (cm) 2.513 Volume: (cm) 0.503 % Reduction in Area: -137.1% % Reduction in Volume: -374.5% Epithelialization: Small (1-33%) Tunneling: No Undermining: No Wound Description Full Thickness Without Exposed Support Classification: Structures Wound Margin: Flat and Intact Exudate Large Amount: Exudate Type: Serous Exudate Color: amber Foul Odor After Cleansing: No Slough/Fibrino Yes Wound Bed Granulation Amount: Medium (34-66%) Exposed Structure Granulation Quality: Pink Fascia Exposed: No Necrotic Amount: Medium (34-66%) Fat Layer (Subcutaneous Tissue) Exposed: Yes Necrotic Quality: Adherent Slough Tendon Exposed: No Muscle Exposed: No Joint Exposed: No Bone Exposed: No Hartshorne, Analiz A. ([id number]) Periwound Skin Texture Texture  Color No Abnormalities Noted: No No Abnormalities Noted: No Callus: No Atrophie Blanche: No Crepitus: No Cyanosis: No Excoriation: No Ecchymosis: No Induration: No Erythema: Yes Rash: No Erythema Location: Circumferential Scarring: No Hemosiderin Staining: Yes Mottled: No Moisture Pallor: No No Abnormalities Noted: No Rubor: No Dry / Scaly: No Maceration: Yes Temperature / Pain Tenderness on Palpation: Yes Wound Preparation Ulcer  Cleansing: Rinsed/Irrigated with Saline, Other: soap and water, Topical Anesthetic Applied: Other: lidocaine 4%, Treatment Notes Wound #3 (Right, Posterior Lower Leg) 1. Cleansed with: Clean wound with Normal Saline 4. Dressing Applied: Other dressing (specify in notes) Notes silvercell,abd conform secured with tubigrip f. Electronic Signature(s) Signed: 05/29/2018 10:18:31 AM By: [name] Entered By: [name] on 05/29/2018 10:18:31 Sano, Kaelyn AMarland Kitchen ([id number]) -------------------------------------------------------------------------------- Wound Assessment Details Patient Name: Marcia [name] Date of Service: 05/29/2018 9:30 AM Medical Record Number: [id number] Patient Account Number: [id number] Date of Birth/Sex: August 16, 1946 (72 y.o. F) Treating RN: [name] Primary Care Nicolle Heward: [name] Other Clinician: Referring Marigold Mom: [name] Treating Alieu Finnigan/Extender: [name] Weeks in Treatment: 2 Wound Status Wound Number: 4 Primary Etiology: Vasculitis Wound Location: Right Malleolus - Medial Secondary Diabetic Wound/Ulcer of the Lower Etiology: Extremity Wounding Event: Gradually Appeared Wound Status: Open Date Acquired: 03/12/2018 Comorbid History: Hypertension, Type II Diabetes, Weeks Of Treatment: 2 Neuropathy Clustered Wound: Yes Photos Photo Uploaded By: [name] on 05/29/2018 11:46:07 Wound Measurements Length: (cm) 10 Width: (cm) 1.8 Depth: (cm) 0.2 Clustered Quantity: 5 Area:  (cm) 14.137 Volume: (cm) 2.827 % Reduction in Area: 22.1% % Reduction in Volume: 22.1% Epithelialization: Large (67-100%) Tunneling: No Undermining: No Wound Description Full Thickness Without Exposed Support Classification: Structures Wound Margin: Flat and Intact Exudate Large Amount: Exudate Type: Serous Exudate Color: amber Foul Odor After Cleansing: No Slough/Fibrino Yes Wound Bed Granulation Amount: Medium (34-66%) Exposed Structure Granulation Quality: Red, Pink Fascia Exposed: No Necrotic Amount: Medium (34-66%) Fat Layer (Subcutaneous Tissue) Exposed: Yes Necrotic Quality: Adherent Slough Tendon Exposed: No Muscle Exposed: No Joint Exposed: No Perezperez, Wrenly A. ([id number]) Bone Exposed: No Periwound Skin Texture Texture Color No Abnormalities Noted: No No Abnormalities Noted: No Callus: No Atrophie Blanche: No Crepitus: No Cyanosis: No Excoriation: No Ecchymosis: No Induration: Yes Erythema: Yes Rash: No Erythema Location: Circumferential Scarring: No Hemosiderin Staining: Yes Mottled: No Moisture Pallor: No No Abnormalities Noted: No Rubor: No Dry / Scaly: No Maceration: No Temperature / Pain Tenderness on Palpation: Yes Wound Preparation Ulcer Cleansing: Rinsed/Irrigated with Saline, Other: soap and water, Topical Anesthetic Applied: Other: lidocaine 4%, Treatment Notes Wound #4 (Right, Medial Malleolus) 1. Cleansed with: Clean wound with Normal Saline 4. Dressing Applied: Other dressing (specify in notes) Notes silvercell,abd conform secured with tubigrip f. Electronic Signature(s) Signed: 05/29/2018 10:18:47 AM By: [name] Entered By: [name] on 05/29/2018 10:18:47 Winberry, Shawn [name] ([id number]) -------------------------------------------------------------------------------- Vitals Details Patient Name: Marcia [name] Date of Service: 05/29/2018 9:30 AM Medical Record Number: [id number] Patient Account Number:  [id number] Date of Birth/Sex: 08/24/1946 (72 y.o. F) Treating RN: [name] Primary Care Cloee Dunwoody: [name] Other Clinician: Referring Dann Galicia: [name] Treating Mersadez Linden/Extender: [name] Weeks in Treatment: 2 Vital Signs Time Taken: 10:19 Temperature (F): 98.1 Height (in): 70 Pulse (bpm): 81 Weight (lbs): 193 Respiratory Rate (breaths/min): 18 Body Mass Index (BMI): 27.7 Blood Pressure (mmHg): 154/57 Reference Range: 80 - 120 mg / dl Electronic Signature(s) Signed: 05/29/2018 10:19:35 AM By: [name] Entered By: [name] on 05/29/2018 10:19:35

## 2018-06-06 NOTE — Progress Notes (Signed)
[name], [name] ([id number]) Visit Report for 05/29/2018 Chief Complaint Document Details Patient Name: [name], [name] Date of Service: 05/29/2018 9:30 AM Medical Record Number: [id number] Patient Account Number: [id number] Date of Birth/Sex: 21-May-1946 (72 y.o. F) Treating RN: Curtis Sites Primary Care Provider: Sherrie Mustache Other Clinician: Referring Provider: DASHER, DAVID Treating Provider/Extender: Linwood Dibbles, HOYT Weeks in Treatment: 2 Information Obtained from: Patient Chief Complaint RLE wounds Electronic Signature(s) Signed: 05/30/2018 1:55:45 PM By: Lenda Kelp PA-C Entered By: Lenda Kelp on 05/30/2018 11:02:33 Fenderson, [name] ([id number]) -------------------------------------------------------------------------------- HPI Details Patient Name: Marcia [name] Date of Service: 05/29/2018 9:30 AM Medical Record Number: [id number] Patient Account Number: [id number] Date of Birth/Sex: 1946-01-19 (72 y.o. F) Treating RN: Curtis Sites Primary Care Provider: Sherrie Mustache Other Clinician: Referring Provider: DASHER, DAVID Treating Provider/Extender: Linwood Dibbles, HOYT Weeks in Treatment: 2 History of Present Illness HPI Description: 05/12/18-She is seen in initial evaluation for multiple open areas to the right lower extremity. She was recently hospitalized (7/30-8/2) with cellulitis of the right leg, failed outpatient therapy with Keflex. While hospitalized she was treated with IV vancomycin and cefepime; right foot x-ray was negative for osteomyelitis. She admits to intermittent pain, improved since hospitalization. She was discharged on doxycycline which she should complete with the next 48 hours. 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 She has a remote history of following with vascular medicine for venous reflux, she does not tolerate wearing compression stockings d/t skin  sensitivities and "burning" sensation. In office ABI are elevated but she has strongly palpable pulses. We will initiate hydrofera blue and ace wrap compression. She is a diabetic with A1c 6.9 while hospitalized. 05/19/18- She is seen in follow up evaluation for RLE wounds; the lateral area is healed. She did tolerate ace wrap for compression, but experienced increased xerosis. We will hold off on ace wrap and increase lachydrin to twice daily. She c/o pain to the medial cluster of wounds, there is no apparent infection and will initiate steroid cream. She will follow up next week 05/26/18 on evaluation today patient appears to actually be having some issues currently with pain. She has been utilizing the dressings as previously recommended for the lower extremities. With that being said she does appear to have a little bit of a rash she states that anytime it he's in for anything else touches her skin that she will often develop a rash. She's not even sure that should be able to tolerate a compression wrap due to the fact that again she would be concerned about even the cotton layer being in contact with her skin which overtime will cause a burning sensation. She has seen dermatology concerning this unfortunately they really did not have a good answer for her as to why this is occurring. Fortunately there is no evidence of infection at this time. 05/29/18 on evaluation today patient was actually supposed to be back in the office in order to have a dressings/wrap change is a nurse visit. With that being said she was having significant discomfort with want to touch around the wound and. It green discharge. Subsequently the concern was that she had an infection so we did convert her to a physician visit instead of just a nurse visit. Subsequently upon evaluation she does seem to have purulent discharge along with evidence of cellulitis extending up the leg which is definitely worse than when I saw her  earlier in the week. Fortunately there does not appear to be any evidence of systemic infection at this point. No fevers, chills, nausea, or vomiting noted at this time. Electronic Signature(s) Signed: 05/30/2018 1:55:45 PM By: Lenda Kelp PA-C Entered By: Lenda Kelp on 05/30/2018 11:03:43 Thurow, [name] ([id number]) -------------------------------------------------------------------------------- Physical Exam Details Patient Name: Marcia [name] Date of Service: 05/29/2018 9:30 AM Medical Record Number: [id number] Patient Account Number: [id number] Date of Birth/Sex: 1946-06-07 (72 y.o. F) Treating RN: Curtis Sites Primary Care Provider: Sherrie Mustache Other Clinician: Referring Provider: DASHER, DAVID Treating Provider/Extender: STONE III, HOYT Weeks in Treatment: 2 Constitutional Well-nourished and well-hydrated in no acute distress. Respiratory normal breathing without difficulty. clear to auscultation bilaterally. Cardiovascular regular rate and rhythm with normal S1, S2. 1+ pitting edema of the bilateral lower extremities. Psychiatric this patient is able to make decisions and demonstrates good insight into disease process. Alert and Oriented x 3. pleasant and cooperative. Notes Upon inspection patient's wound does seem to have significant erythema and likely cellulitis/wound infection noted at this point in time. She agreement. It discharge noted on evaluation unfortunately. I do believe that an antibiotic is very appropriate for her at this time. Fortunately she has no systemic signs or symptoms of infection. No sharp debridement performed. Electronic Signature(s) Signed: 05/30/2018 1:55:45 PM By: Lenda Kelp PA-C Entered By: Lenda Kelp on 05/30/2018 11:05:26 Pellegrini, [name] ([id number]) -------------------------------------------------------------------------------- Physician Orders Details Patient Name: Marcia [name] Date of Service: 05/29/2018 9:30  AM Medical Record Number: [id number] Patient Account Number: [id number] Date of Birth/Sex: Jun 20, 1946 (72 y.o. F) Treating RN: Curtis Sites Primary Care Provider: Sherrie Mustache Other Clinician: Referring Provider: DASHER, DAVID Treating Provider/Extender: Linwood Dibbles, HOYT Weeks in Treatment: 2 Verbal / Phone Orders: No Diagnosis Coding Wound Cleansing Wound #3 Right,Posterior Lower Leg o Clean wound with Normal Saline. o Cleanse wound with mild soap and water Wound #4 Right,Medial Malleolus o Clean wound with Normal Saline. o Cleanse wound with mild soap and water Anesthetic (add to Medication List) Wound #3 Right,Posterior Lower Leg o Topical Lidocaine 4% cream applied to wound bed prior to debridement (In Clinic Only). Wound #4 Right,Medial Malleolus o Topical Lidocaine 4% cream applied to wound bed prior to debridement (In Clinic Only). Skin Barriers/Peri-Wound Care Wound #3 Right,Posterior Lower Leg o Triamcinolone Acetonide Ointment (TCA) - in clinic today only - not for home use Wound #4 Right,Medial Malleolus o Triamcinolone Acetonide Ointment (TCA) - in clinic today only - not for home use Primary Wound Dressing Wound #3 Right,Posterior Lower Leg o Silver Alginate Wound #4 Right,Medial Malleolus o Silver Alginate Secondary Dressing Wound #3 Right,Posterior Lower Leg o Gauze, ABD and Kerlix/Conform - conform Wound #4 Right,Medial Malleolus o Gauze, ABD and Kerlix/Conform - conform Dressing Change Frequency Wound #3 Right,Posterior Lower Leg o Change dressing every other day. Wound #4 Right,Medial Malleolus o Change dressing every other day. RAMELLE, SCHMECHEL ([id number]) Follow-up Appointments Wound #3 Right,Posterior Lower Leg o Return Appointment in 1 week. Wound #4 Right,Medial Malleolus o Return Appointment in 1 week. Edema Control Wound #3 Right,Posterior Lower Leg o Elevate legs to the level of the heart and pump ankles as  often as possible o Other: - tubigrip Wound #4 Right,Medial Malleolus o Elevate legs to the level of the heart and pump ankles as often as possible o Other: - tubigrip Additional Orders / Instructions Wound #3 Right,Posterior Lower Leg o Increase protein intake. Wound #4 Right,Medial Malleolus o Increase protein intake. Laboratory o Bacteria  identified in Wound by Culture (MICRO) oooo LOINC Code: [phone] oooo Convenience Name: Wound culture routine Radiology o MRI, lower extremity without contrast Patient Medications Allergies: No Known Allergies Notifications Medication Indication Start End Cipro 05/29/2018 DOSE 1 - oral 500 mg tablet - 1 tablet oral take 2 times a day for 14 days Electronic Signature(s) Signed: 05/29/2018 4:18:40 PM By: Curtis Sites Signed: 05/30/2018 1:55:45 PM By: Lenda Kelp PA-C Previous Signature: 05/29/2018 10:43:36 AM Version By: Lenda Kelp PA-C Entered By: Curtis Sites on 05/29/2018 16:17:13 [place], Chandell AMarland Kitchen ([id number]) -------------------------------------------------------------------------------- Problem List Details Patient Name: Marcia [name] Date of Service: 05/29/2018 9:30 AM Medical Record Number: [id number] Patient Account Number: [id number] Date of Birth/Sex: 07-10-1946 (72 y.o. F) Treating RN: Curtis Sites Primary Care Provider: Sherrie Mustache Other Clinician: Referring Provider: DASHER, DAVID Treating Provider/Extender: Linwood Dibbles, HOYT Weeks in Treatment: 2 Active Problems ICD-10 Evaluated Encounter Code Description Active Date Today Diagnosis 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 05/12/2018 No Yes L97.212 Non-pressure chronic ulcer of right calf with fat layer exposed 05/12/2018 No Yes L97.312 Non-pressure chronic ulcer of right ankle with fat layer 05/12/2018 No Yes exposed E11.622 Type 2 diabetes mellitus with other skin ulcer 05/12/2018 No Yes Inactive Problems Resolved Problems Electronic Signature(s) Signed: 05/30/2018  1:55:45 PM By: Lenda Kelp PA-C Entered By: Lenda Kelp on 05/30/2018 11:02:11 [name], [name] ([id number]) -------------------------------------------------------------------------------- Progress Note Details Patient Name: Marcia [name] Date of Service: 05/29/2018 9:30 AM Medical Record Number: [id number] Patient Account Number: [id number] Date of Birth/Sex: 10-19-1945 (72 y.o. F) Treating RN: Curtis Sites Primary Care Provider: Sherrie Mustache Other Clinician: Referring Provider: DASHER, DAVID Treating Provider/Extender: Linwood Dibbles, HOYT Weeks in Treatment: 2 Subjective Chief Complaint Information obtained from Patient RLE wounds History of Present Illness (HPI) 05/12/18-She is seen in initial evaluation for multiple open areas to the right lower extremity. She was recently hospitalized (7/30-8/2) with cellulitis of the right leg, failed outpatient therapy with Keflex. While hospitalized she was treated with IV vancomycin and cefepime; right foot x-ray was negative for osteomyelitis. She admits to intermittent pain, improved since hospitalization. She was discharged on doxycycline which she should complete with the next 48 hours. 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 She has a remote history of following with vascular medicine for venous reflux, she does not tolerate wearing compression stockings d/t skin sensitivities and "burning" sensation. In office ABI are elevated but she has strongly palpable pulses. We will initiate hydrofera blue and ace wrap compression. She is a diabetic with A1c 6.9 while hospitalized. 05/19/18- She is seen in follow up evaluation for RLE wounds; the lateral area is healed. She did tolerate ace wrap for compression, but experienced increased xerosis. We will hold off on ace wrap and increase lachydrin to twice daily. She c/o pain to the medial cluster of  wounds, there is no apparent infection and will initiate steroid cream. She will follow up next week 05/26/18 on evaluation today patient appears to actually be having some issues currently with pain. She has been utilizing the dressings as previously recommended for the lower extremities. With that being said she does appear to have a little bit of a rash she states that anytime it he's in for anything else touches her skin that she will often develop a rash. She's not even sure that should be able to tolerate a compression wrap due to the fact that again she would be  concerned about even the cotton layer being in contact with her skin which overtime will cause a burning sensation. She has seen dermatology concerning this unfortunately they really did not have a good answer for her as to why this is occurring. Fortunately there is no evidence of infection at this time. 05/29/18 on evaluation today patient was actually supposed to be back in the office in order to have a dressings/wrap change is a nurse visit. With that being said she was having significant discomfort with want to touch around the wound and. It green discharge. Subsequently the concern was that she had an infection so we did convert her to a physician visit instead of just a nurse visit. Subsequently upon evaluation she does seem to have purulent discharge along with evidence of cellulitis extending up the leg which is definitely worse than when I saw her earlier in the week. Fortunately there does not appear to be any evidence of systemic infection at this point. No fevers, chills, nausea, or vomiting noted at this time. Patient History Information obtained from Patient. Family History Cancer - Siblings, Diabetes - Mother,Father,Siblings, Heart Disease - Father, Hypertension - Father, Stroke - Father,Mother, No family history of Kidney Disease, Lung Disease, Seizures, Thyroid Problems, Tuberculosis. Social History Former smoker -  10-15 years quit, Marital Status - Married, Alcohol Use - Never, Drug Use - No History, Caffeine Use - Daily. Medical And Surgical History Notes Constitutional Symptoms (General Health) [name], [name] ([id number]) vitamin d deficiency Respiratory nodule of lung Cardiovascular dyspnea 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, AVV Musculoskeletal arthritis Review of Systems (ROS) Constitutional Symptoms (General Health) Denies complaints or symptoms of Fever, Chills. Respiratory The patient has no complaints or symptoms. Cardiovascular Complains or has symptoms of LE edema. Psychiatric The patient has no complaints or symptoms. Objective Constitutional Well-nourished and well-hydrated in no acute distress. Vitals Time Taken: 10:19 AM, Height: 70 in, Weight: 193 lbs, BMI: 27.7, Temperature: 98.1 F, Pulse: 81 bpm, Respiratory Rate: 18 breaths/min, Blood Pressure: 154/57 mmHg. Respiratory normal breathing without difficulty. clear to auscultation bilaterally. Cardiovascular regular rate and rhythm with normal S1, S2. 1+ pitting edema of the bilateral lower extremities. Psychiatric this patient is able to make decisions and demonstrates good insight into disease process. Alert and Oriented x 3. pleasant and cooperative. General Notes: Upon inspection patient's wound does seem to have significant erythema and likely cellulitis/wound infection noted at this point in time. She agreement. It discharge noted on evaluation unfortunately. I do believe that an antibiotic is very appropriate for her at this time. Fortunately she has no systemic signs or symptoms of infection. No sharp debridement performed. Integumentary (Hair, Skin) Wound #3 status is Open. Original cause of wound was Gradually Appeared. The wound is located on the Right,Posterior Lower Leg. The wound measures 2cm length x 1.6cm width x 0.2cm depth; 2.513cm^2 area and 0.503cm^3 volume. There is Fat Layer (Subcutaneous  Tissue) Exposed exposed. There is no tunneling or undermining noted. There is a large amount of serous drainage noted. The wound margin is flat and intact. There is medium (34-66%) pink granulation within the wound bed. There is a medium (34-66%) amount of necrotic tissue within the wound bed including Adherent Slough. The periwound skin appearance exhibited: Maceration, Hemosiderin Staining, Erythema. The periwound skin appearance did not exhibit: Callus, Crepitus, Excoriation, Induration, Rash, Scarring, Dry/Scaly, Atrophie Blanche, Cyanosis, Ecchymosis, Mottled, Pallor, Cambre, Akisha A. ([id number]) Rubor. The surrounding wound skin color is noted with erythema which is circumferential. The periwound  has tenderness on palpation. Wound #4 status is Open. Original cause of wound was Gradually Appeared. The wound is located on the Right,Medial Malleolus. The wound measures 10cm length x 1.8cm width x 0.2cm depth; 14.137cm^2 area and 2.827cm^3 volume. There is Fat Layer (Subcutaneous Tissue) Exposed exposed. There is no tunneling or undermining noted. There is a large amount of serous drainage noted. The wound margin is flat and intact. There is medium (34-66%) red, pink granulation within the wound bed. There is a medium (34-66%) amount of necrotic tissue within the wound bed including Adherent Slough. The periwound skin appearance exhibited: Induration, Hemosiderin Staining, Erythema. The periwound skin appearance did not exhibit: Callus, Crepitus, Excoriation, Rash, Scarring, Dry/Scaly, Maceration, Atrophie Blanche, Cyanosis, Ecchymosis, Mottled, Pallor, Rubor. The surrounding wound skin color is noted with erythema which is circumferential. The periwound has tenderness on palpation. 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 Cleanse wound with mild soap and water Wound #4 Right,Medial Malleolus: Clean wound with Normal Saline. Cleanse wound with mild soap and water Anesthetic (add to Medication List): Wound #3 Right,Posterior Lower Leg: Topical Lidocaine 4% cream applied to wound bed prior to debridement (In Clinic Only). Wound #4 Right,Medial Malleolus: Topical Lidocaine 4% cream applied to wound bed prior to debridement (In Clinic Only). Skin Barriers/Peri-Wound Care: Wound #3 Right,Posterior Lower Leg: Triamcinolone Acetonide Ointment (TCA) - in clinic today only - not for home use Wound #4 Right,Medial Malleolus: Triamcinolone Acetonide Ointment (TCA) - in clinic today only - not for home use Primary Wound Dressing: Wound #3 Right,Posterior Lower Leg: Silver Alginate Wound #4 Right,Medial Malleolus: Silver Alginate Secondary Dressing: Wound #3 Right,Posterior Lower Leg: Gauze, ABD and Kerlix/Conform - conform [name], [name] ([id number]) Wound #4 Right,Medial Malleolus: Gauze, ABD and Kerlix/Conform - conform Dressing Change Frequency: Wound #3 Right,Posterior Lower Leg: Change dressing every other day. Wound #4 Right,Medial Malleolus: Change dressing every other day. Follow-up Appointments: Wound #3 Right,Posterior Lower Leg: Return Appointment in 1 week. Wound #4 Right,Medial Malleolus: Return Appointment in 1 week. Edema Control: Wound #3 Right,Posterior Lower Leg: Elevate legs to the level of the heart and pump ankles as often as possible Other: - tubigrip Wound #4 Right,Medial Malleolus: Elevate legs to the level of the heart and pump ankles as often as possible Other: - tubigrip Additional Orders / Instructions: Wound #3 Right,Posterior Lower Leg: Increase protein intake. Wound #4 Right,Medial Malleolus: Increase protein intake. Laboratory ordered were: Wound culture  routine Radiology ordered were: MRI, lower extremity without contrast The following medication(s) was prescribed: Cipro oral 500 mg tablet 1 1 tablet oral take 2 times a day for 14 days starting 05/29/2018 I'm gonna suggest currently that we actually continue with the above wound care orders for the next week I am not going to have her wrapped regularly. She will do this on her own at home she keep an eye on everything and ensure that the infection is not getting worse. I did advise her if she had fever, chills, nausea, vomiting, or diarrhea that she needs to go to the hospital/ER as soon as possible due to the possibility of sepsis. She understands. Please see above for specific wound care orders. We will see patient for re-evaluation in 1 week(s) here in the clinic. If anything worsens or changes  patient will contact our office for additional recommendations. Electronic Signature(s) Signed: 05/30/2018 1:55:45 PM By: Lenda Kelp PA-C Entered By: Lenda Kelp on 05/30/2018 11:13:21 [name], [name] ([id number]) -------------------------------------------------------------------------------- ROS/PFSH Details Patient Name: Marcia [name] Date of Service: 05/29/2018 9:30 AM Medical Record Number: [id number] Patient Account Number: [id number] Date of Birth/Sex: 05-20-1946 (72 y.o. F) Treating RN: Curtis Sites Primary Care Provider: Sherrie Mustache Other Clinician: Referring Provider: DASHER, DAVID Treating Provider/Extender: Linwood Dibbles, HOYT Weeks in Treatment: 2 Information Obtained From Patient Wound History Do you currently have one or more open woundso Yes How many open wounds do you currently haveo 5 Approximately how long have you had your woundso 6 How have you been treating your wound(s) until nowo abx Has your wound(s) ever healed and then re-openedo No Have you had any lab work done in the past montho Yes Who ordered the lab work doneo hospital Have you tested positive for  an antibiotic resistant organism (MRSA, VRE)o No Have you tested positive for osteomyelitis (bone infection)o No Have you had any tests for circulation on your legso Yes Constitutional Symptoms (General Health) Complaints and Symptoms: Negative for: Fever; Chills Medical History: Past Medical History Notes: vitamin d deficiency Cardiovascular Complaints and Symptoms: Positive for: LE edema Medical History: Positive for: Hypertension Negative for: Angina; Arrhythmia; Congestive Heart Failure; Coronary Artery Disease; Deep Vein Thrombosis; Hypotension; Myocardial Infarction; Peripheral Arterial Disease; Peripheral Venous Disease; Phlebitis; Vasculitis Past Medical History Notes: dyspnea 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, [place] Eyes Medical History: Negative for: Cataracts; Glaucoma; Optic Neuritis Ear/Nose/Mouth/Throat Medical History: Negative for: Chronic sinus problems/congestion; Middle ear problems Hematologic/Lymphatic KIRANDEEP, FARISS A. ([id number]) Medical History: Negative for: Anemia; Hemophilia; Human Immunodeficiency Virus; Lymphedema; Sickle Cell Disease Respiratory Complaints and Symptoms: No Complaints or Symptoms Medical History: Negative for: Aspiration; Asthma; Chronic Obstructive Pulmonary Disease (COPD); Pneumothorax; Sleep Apnea; Tuberculosis Past Medical History Notes: nodule of lung Gastrointestinal Medical History: Negative for: Cirrhosis ; Colitis; Crohnos; Hepatitis A; Hepatitis B; Hepatitis C Endocrine Medical History: Positive for: Type II Diabetes Time with diabetes: 10 years Treated with: Oral agents Blood sugar tested every day: Yes Tested : every mornimg Genitourinary Medical History: Negative for: End Stage Renal Disease Immunological Medical History: Negative for: Lupus Erythematosus; Raynaudos; Scleroderma Integumentary (Skin) Medical History: Negative for: History of Burn; History of pressure wounds Musculoskeletal Medical  History: Negative for: Gout; Rheumatoid Arthritis; Osteoarthritis; Osteomyelitis Past Medical History Notes: arthritis Neurologic Medical History: Positive for: Neuropathy - peripheral Negative for: Dementia; Quadriplegia; Paraplegia; Seizure Disorder Oncologic [name], [name] ([id number]) Medical History: Negative for: Received Chemotherapy; Received Radiation Psychiatric Complaints and Symptoms: No Complaints or Symptoms Medical History: Negative for: Anorexia/bulimia; Confinement Anxiety Immunizations Pneumococcal Vaccine: Received Pneumococcal Vaccination: Yes Tetanus Vaccine: Last tetanus shot: 05/12/2014 Immunization Notes: plan on flu shot Implantable Devices Family and Social History Cancer: Yes - Siblings; Diabetes: Yes - Mother,Father,Siblings; Heart Disease: Yes - Father; Hypertension: Yes - Father; Kidney Disease: No; Lung Disease: No; Seizures: No; Stroke: Yes - Father,Mother; Thyroid Problems: No; Tuberculosis: No; Former smoker - 10-15 years quit; Marital Status - Married; Alcohol Use: Never; Drug Use: No History; Caffeine Use: Daily; Financial Concerns: No; Food, Clothing or Shelter Needs: No; Support System Lacking: No; Transportation Concerns: No; Advanced Directives: No; Patient does not want information on Advanced Directives; Living Will: No; Medical Power of Attorney: No Physician Affirmation I have reviewed and agree with the above information. Electronic Signature(s) Signed: 05/30/2018 1:55:45 PM By: Lenda Kelp PA-C Entered By: Lenda Kelp  on 05/30/2018 11:05:06 [name], [name] ([id number]) -------------------------------------------------------------------------------- SuperBill Details Patient Name: [name], [name] Date of Service: 05/29/2018 Medical Record Number: [id number] Patient Account Number: [id number] Date of Birth/Sex: Sep 16, 1946 (72 y.o. 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 mellitus with other skin ulcer Facility Procedures CPT4 Code: 78295621 Description: 99214 - WOUND CARE VISIT-LEV 4 EST PT Modifier: Quantity: 1 Physician Procedures CPT4 Code: 3086578 Description: 99214 - WC PHYS LEVEL 4 - EST PT ICD-10 Diagnosis Description 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

## 2018-06-12 ENCOUNTER — Encounter: Payer: Medicare HMO | Attending: Physician Assistant | Admitting: Physician Assistant

## 2018-06-12 DIAGNOSIS — E785 Hyperlipidemia, unspecified: Secondary | ICD-10-CM | POA: Insufficient documentation

## 2018-06-12 DIAGNOSIS — I10 Essential (primary) hypertension: Secondary | ICD-10-CM | POA: Diagnosis not present

## 2018-06-12 DIAGNOSIS — M1990 Unspecified osteoarthritis, unspecified site: Secondary | ICD-10-CM | POA: Insufficient documentation

## 2018-06-12 DIAGNOSIS — Z87891 Personal history of nicotine dependence: Secondary | ICD-10-CM | POA: Insufficient documentation

## 2018-06-12 DIAGNOSIS — E1140 Type 2 diabetes mellitus with diabetic neuropathy, unspecified: Secondary | ICD-10-CM | POA: Insufficient documentation

## 2018-06-12 DIAGNOSIS — L97212 Non-pressure chronic ulcer of right calf with fat layer exposed: Secondary | ICD-10-CM | POA: Insufficient documentation

## 2018-06-12 DIAGNOSIS — E11622 Type 2 diabetes mellitus with other skin ulcer: Secondary | ICD-10-CM | POA: Diagnosis present

## 2018-06-12 DIAGNOSIS — L97312 Non-pressure chronic ulcer of right ankle with fat layer exposed: Secondary | ICD-10-CM | POA: Insufficient documentation

## 2018-06-12 DIAGNOSIS — L950 Livedoid vasculitis: Secondary | ICD-10-CM | POA: Diagnosis not present

## 2018-06-15 NOTE — Progress Notes (Signed)
[name], [name] ([id number]) Visit Report for 06/12/2018 Arrival Information Details Patient Name: [name], [name] Date of Service: 06/12/2018 11:00 AM Medical Record Number: [id number] Patient Account Number: [id number] Date of Birth/Sex: [birth year] (72 y.o. F) Treating RN: Renne Crigler Primary Care Lael Pilch: Sherrie Mustache Other Clinician: Referring Kiernan Atkerson: DASHER, DAVID Treating Edan Juday/Extender: Linwood Dibbles, HOYT Weeks in Treatment: 4 Visit Information History Since Last Visit All ordered tests and consults were completed: No Patient Arrived: Ambulatory Added or deleted any medications: No Arrival Time: 11:14 Any new allergies or adverse reactions: No Accompanied By: self Had a fall or experienced change in No Transfer Assistance: None activities of daily living that may affect Patient Identification Verified: Yes risk of falls: Secondary Verification Process Completed: Yes Signs or symptoms of abuse/neglect since last visito No Patient Requires Transmission-Based No Hospitalized since last visit: No Precautions: Implantable device outside of the clinic excluding No Patient Has Alerts: No cellular tissue based products placed in the center since last visit: Pain Present Now: Yes Electronic Signature(s) Signed: 06/12/2018 11:36:22 AM By: Renne Crigler Entered By: Renne Crigler on 06/12/2018 11:16:32 Mattes, [name] ([id number]) -------------------------------------------------------------------------------- Lower Extremity Assessment Details Patient Name: Marcia [name] Date of Service: 06/12/2018 11:00 AM Medical Record Number: [id number] Patient Account Number: [id number] Date of Birth/Sex: Feb 07, 1946 (72 y.o. F) Treating RN: Renne Crigler Primary Care Philipe Laswell: Sherrie Mustache Other Clinician: Referring Yamil Oelke: DASHER, DAVID Treating Lancer Thurner/Extender: Linwood Dibbles, HOYT Weeks in Treatment: 4 Edema Assessment Assessed: [Left: No] [Right: No] Edema: [Left: Ye]  [Right: s] Calf Left: Right: Point of Measurement: 34 cm From Medial Instep cm 41 cm Ankle Left: Right: Point of Measurement: 12 cm From Medial Instep cm 22.5 cm Vascular Assessment Claudication: Claudication Assessment [Right:None] Pulses: Dorsalis Pedis Palpable: [Right:Yes] Posterior Tibial Extremity colors, hair growth, and conditions: Extremity Color: [Right:Hyperpigmented] Hair Growth on Extremity: [Right:No] Temperature of Extremity: [Right:Warm] Capillary Refill: [Right:< 3 seconds] Toe Nail Assessment Left: Right: Thick: No Discolored: No Deformed: No Improper Length and Hygiene: No Electronic Signature(s) Signed: 06/12/2018 11:36:22 AM By: Renne Crigler Entered By: Renne Crigler on 06/12/2018 11:27:10 Garden, Randal AMarland Kitchen ([id number]) -------------------------------------------------------------------------------- Multi Wound Chart Details Patient Name: Marcia [name] Date of Service: 06/12/2018 11:00 AM Medical Record Number: [id number] Patient Account Number: [id number] Date of Birth/Sex: 12-29-1945 (72 y.o. F) Treating RN: Curtis Sites Primary Care Markevious Ehmke: Sherrie Mustache Other Clinician: Referring Addalee Kavanagh: DASHER, DAVID Treating Lacy Sofia/Extender: STONE III, HOYT Weeks in Treatment: 4 Vital Signs Height(in): 70 Pulse(bpm): 65 Weight(lbs): 193 Blood Pressure(mmHg): 127/52 Body Mass Index(BMI): 28 Temperature(F): 98.0 Respiratory Rate 18 (breaths/min): Photos: [3:No Photos] [4:No Photos] [N/A:N/A] Wound Location: [3:Right Lower Leg - Posterior] [4:Right Malleolus - Medial] [N/A:N/A] Wounding Event: [3:Gradually Appeared] [4:Gradually Appeared] [N/A:N/A] Primary Etiology: [3:Vasculitis] [4:Vasculitis] [N/A:N/A] 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] [N/A:N/A] Comorbid History: [3:Hypertension, Type II Diabetes, Neuropathy] [4:Hypertension, Type II Diabetes, Neuropathy]  [N/A:N/A] Date Acquired: [3:03/12/2018] [4:03/12/2018] [N/A:N/A] Weeks of Treatment: [3:4] [4:4] [N/A:N/A] Wound Status: [3:Open] [4:Open] [N/A:N/A] Clustered Wound: [3:No] [4:Yes] [N/A:N/A] Clustered Quantity: [3:N/A] [4:5] [N/A:N/A] Measurements L x W x D [3:5.5x1.5x0.2] [4:11.5x8x0.2] [N/A:N/A] (cm) Area (cm) : [3:6.48] [4:72.257] [N/A:N/A] Volume (cm) : [3:1.296] [4:14.451] [N/A:N/A] % Reduction in Area: [3:-511.30%] [4:-298.30%] [N/A:N/A] % Reduction in Volume: [3:-1122.60%] [4:-298.20%] [N/A:N/A] Classification: [3:Full Thickness Without Exposed Support Structures] [4:Full Thickness Without Exposed Support Structures] [N/A:N/A] Exudate Amount: [3:Large] [4:Large] [N/A:N/A] Exudate Type: [3:Purulent] [4:Purulent] [N/A:N/A] Exudate Color: [3:yellow, brown, green] [4:yellow, brown, green] [N/A:N/A] Wound Margin: [3:Flat and Intact] [4:Flat and Intact] [N/A:N/A]  Granulation Amount: [3:Medium (34-66%)] [4:Small (1-33%)] [N/A:N/A] Granulation Quality: [3:Pink] [4:Pink] [N/A:N/A] Necrotic Amount: [3:Medium (34-66%)] [4:Large (67-100%)] [N/A:N/A] Necrotic Tissue: [3:Eschar, Adherent Slough] [4:Eschar, Adherent Slough] [N/A:N/A] Exposed Structures: [3:Fat Layer (Subcutaneous Tissue) Exposed: Yes Fascia: No Tendon: No Muscle: No Joint: No Bone: No] [4:Fat Layer (Subcutaneous Tissue) Exposed: Yes Fascia: No Tendon: No Muscle: No Joint: No Bone: No] [N/A:N/A] Epithelialization: [3:Small (1-33%)] [4:Small (1-33%)] [N/A:N/A] Periwound Skin Texture: Excoriation: No Induration: Yes N/A Induration: No Excoriation: No Callus: No Callus: No Crepitus: No Crepitus: No Rash: No Rash: No Scarring: No Scarring: No Periwound Skin Moisture: Maceration: Yes Maceration: No N/A Dry/Scaly: No Dry/Scaly: No Periwound Skin Color: Erythema: Yes Erythema: Yes N/A Hemosiderin Staining: Yes Hemosiderin Staining: Yes Atrophie Blanche: No Atrophie Blanche: No Cyanosis: No Cyanosis: No Ecchymosis:  No Ecchymosis: No Mottled: No Mottled: No Pallor: No Pallor: No Rubor: No Rubor: No Erythema Location: Circumferential Circumferential N/A Temperature: No Abnormality No Abnormality N/A Tenderness on Palpation: Yes Yes N/A Wound Preparation: Ulcer Cleansing: Ulcer Cleansing: N/A Rinsed/Irrigated with Saline, Rinsed/Irrigated with Saline, Other: soap and water Other: soap and water Topical Anesthetic Applied: Topical Anesthetic Applied: Other: lidocaine 4% Other: lidocaine 4% Treatment Notes Electronic Signature(s) Signed: 06/12/2018 5:28:07 PM By: Curtis Sites Entered By: Curtis Sites on 06/12/2018 11:53:15 Klecka, [name] ([id number]) -------------------------------------------------------------------------------- Multi-Disciplinary Care Plan Details Patient Name: Marcia [name] Date of Service: 06/12/2018 11:00 AM Medical Record Number: [id number] Patient Account Number: [id number] Date of Birth/Sex: 1946/01/12 (72 y.o. F) Treating RN: Curtis Sites Primary Care Ileen Kahre: Sherrie Mustache Other Clinician: Referring Daeja Helderman: DASHER, DAVID Treating Merryn Thaker/Extender: Linwood Dibbles, HOYT Weeks in Treatment: 4 Active Inactive ` Abuse / Safety / Falls / Self Care Management Nursing Diagnoses: Potential for falls Goals: Patient will not experience any injury related to falls Date Initiated: 05/12/2018 Target Resolution Date: 08/15/2018 Goal Status: Active Interventions: Assess Activities of Daily Living upon admission and as needed Assess fall risk on admission and as needed Assess: immobility, friction, shearing, incontinence upon admission and as needed Assess impairment of mobility on admission and as needed per policy Assess personal safety and home safety (as indicated) on admission and as needed Notes: ` Nutrition Nursing Diagnoses: Imbalanced nutrition Impaired glucose control: actual or potential Goals: Patient/caregiver agrees to and verbalizes understanding of  need to use nutritional supplements and/or vitamins as prescribed Date Initiated: 05/12/2018 Target Resolution Date: 09/12/2018 Goal Status: Active Patient/caregiver will maintain therapeutic glucose control Date Initiated: 05/12/2018 Target Resolution Date: 08/15/2018 Goal Status: Active Interventions: Assess patient nutrition upon admission and as needed per policy Provide education on elevated blood sugars and impact on wound healing Provide education on nutrition Notes: JAMECIA, LERMAN ([id number]) Orientation to the Wound Care Program Nursing Diagnoses: Knowledge deficit related to the wound healing center program Goals: Patient/caregiver will verbalize understanding of the Wound Healing Center Program Date Initiated: 05/12/2018 Target Resolution Date: 06/13/2018 Goal Status: Active Interventions: Provide education on orientation to the wound center Notes: ` Wound/Skin Impairment Nursing Diagnoses: Impaired tissue integrity Knowledge deficit related to ulceration/compromised skin integrity Goals: Ulcer/skin breakdown will have a volume reduction of 80% by week 12 Date Initiated: 05/12/2018 Target Resolution Date: 09/05/2018 Goal Status: Active Interventions: Assess patient/caregiver ability to perform ulcer/skin care regimen upon admission and as needed Assess ulceration(s) every visit Notes: Electronic Signature(s) Signed: 06/12/2018 5:28:07 PM By: Curtis Sites Entered By: Curtis Sites on 06/12/2018 11:52:52 Daddario, Annabelle AMarland Kitchen ([id number]) -------------------------------------------------------------------------------- Pain Assessment Details Patient Name: Marcia [name] Date of Service: 06/12/2018 11:00 AM Medical Record Number: [id number]  Patient Account Number: [id number] Date of Birth/Sex: 1946-03-28 (72 y.o. F) Treating RN: Renne Crigler Primary Care Jawaan Adachi: Sherrie Mustache Other Clinician: Referring Nyeisha Goodall: DASHER, DAVID Treating Normagene Harvie/Extender: Linwood Dibbles,  HOYT Weeks in Treatment: 4 Active Problems Location of Pain Severity and Description of Pain Patient Has Paino Yes Site Locations Duration of the Pain. Constant / Intermittento Intermittent Rate the pain. Current Pain Level: 2 Pain Management and Medication Current Pain Management: Electronic Signature(s) Signed: 06/12/2018 11:36:22 AM By: Renne Crigler Entered By: Renne Crigler on 06/12/2018 11:17:04 Metivier, Jazmon AMarland Kitchen ([id number]) -------------------------------------------------------------------------------- Wound Assessment Details Patient Name: Marcia [name] Date of Service: 06/12/2018 11:00 AM Medical Record Number: [id number] Patient Account Number: [id number] Date of Birth/Sex: 07/02/1946 (72 y.o. F) Treating RN: Renne Crigler Primary Care Sequoyah Ramone: Sherrie Mustache Other Clinician: Referring Kairyn Olmeda: DASHER, DAVID Treating Nadira Single/Extender: Linwood Dibbles, HOYT Weeks in Treatment: 4 Wound Status Wound Number: 3 Primary Etiology: Vasculitis Wound Location: Right Lower Leg - Posterior Secondary Diabetic Wound/Ulcer of the Lower Etiology: Extremity Wounding Event: Gradually Appeared Wound Status: Open Date Acquired: 03/12/2018 Comorbid History: Hypertension, Type II Diabetes, Weeks Of Treatment: 4 Neuropathy Clustered Wound: No Wound Measurements Length: (cm) 5.5 Width: (cm) 1.5 Depth: (cm) 0.2 Area: (cm) 6.48 Volume: (cm) 1.296 % Reduction in Area: -511.3% % Reduction in Volume: -1122.6% Epithelialization: Small (1-33%) Tunneling: No Undermining: No Wound Description Full Thickness Without Exposed Support Classification: Structures Wound Margin: Flat and Intact Exudate Large Amount: Exudate Type: Purulent Exudate Color: yellow, brown, green Foul Odor After Cleansing: No Slough/Fibrino Yes Wound Bed Granulation Amount: Medium (34-66%) Exposed Structure Granulation Quality: Pink Fascia Exposed: No Necrotic Amount: Medium (34-66%) Fat Layer  (Subcutaneous Tissue) Exposed: Yes Necrotic Quality: Eschar, Adherent Slough Tendon Exposed: No Muscle Exposed: No Joint Exposed: No Bone Exposed: No Periwound Skin Texture Texture Color No Abnormalities Noted: No No Abnormalities Noted: No Callus: No Atrophie Blanche: No Crepitus: No Cyanosis: No Excoriation: No Ecchymosis: No Induration: No Erythema: Yes Rash: No Erythema Location: Circumferential Scarring: No Hemosiderin Staining: Yes Mottled: No Moisture Pallor: No No Abnormalities Noted: No Rubor: No Dry / Scaly: No Combes, Hadlei A. ([id number]) Maceration: Yes Temperature / Pain Temperature: No Abnormality Tenderness on Palpation: Yes Wound Preparation Ulcer Cleansing: Rinsed/Irrigated with Saline, Other: soap and water, Topical Anesthetic Applied: Other: lidocaine 4%, Electronic Signature(s) Signed: 06/12/2018 11:36:22 AM By: Renne Crigler Entered By: Renne Crigler on 06/12/2018 11:25:24 Wicklund, [name] ([id number]) -------------------------------------------------------------------------------- Wound Assessment Details Patient Name: Marcia [name] Date of Service: 06/12/2018 11:00 AM Medical Record Number: [id number] Patient Account Number: [id number] Date of Birth/Sex: Apr 09, 1946 (72 y.o. F) Treating RN: Renne Crigler Primary Care Kammie Scioli: Sherrie Mustache Other Clinician: Referring Welton Bord: DASHER, DAVID Treating Levonte Molina/Extender: STONE III, HOYT Weeks in Treatment: 4 Wound Status Wound Number: 4 Primary Etiology: Vasculitis Wound Location: Right Malleolus - Medial Secondary Diabetic Wound/Ulcer of the Lower Etiology: Extremity Wounding Event: Gradually Appeared Wound Status: Open Date Acquired: 03/12/2018 Comorbid History: Hypertension, Type II Diabetes, Weeks Of Treatment: 4 Neuropathy Clustered Wound: Yes Wound Measurements Length: (cm) 11.5 Width: (cm) 8 Depth: (cm) 0.2 Clustered Quantity: 5 Area: (cm) 72.257 Volume: (cm)  14.451 % Reduction in Area: -298.3% % Reduction in Volume: -298.2% Epithelialization: Small (1-33%) Tunneling: No Undermining: No Wound Description Full Thickness Without Exposed Support Classification: Structures Wound Margin: Flat and Intact Exudate Large Amount: Exudate Type: Purulent Exudate Color: yellow, brown, green Foul Odor After Cleansing: No Slough/Fibrino Yes Wound Bed Granulation Amount: Small (1-33%) Exposed Structure Granulation Quality: Pink Fascia Exposed: No Necrotic Amount: Large (67-100%) Fat  Layer (Subcutaneous Tissue) Exposed: Yes Necrotic Quality: Eschar, Adherent Slough Tendon Exposed: No Muscle Exposed: No Joint Exposed: No Bone Exposed: No Periwound Skin Texture Texture Color No Abnormalities Noted: No No Abnormalities Noted: No Callus: No Atrophie Blanche: No Crepitus: No Cyanosis: No Excoriation: No Ecchymosis: No Induration: Yes Erythema: Yes Rash: No Erythema Location: Circumferential Scarring: No Hemosiderin Staining: Yes Mottled: No Moisture Pallor: No No Abnormalities Noted: No Barb, Martrice A. ([id number]) Dry / Scaly: No Rubor: No Maceration: No Temperature / Pain Temperature: No Abnormality Tenderness on Palpation: Yes Wound Preparation Ulcer Cleansing: Rinsed/Irrigated with Saline, Other: soap and water, Topical Anesthetic Applied: Other: lidocaine 4%, Electronic Signature(s) Signed: 06/12/2018 11:36:22 AM By: Renne Crigler Entered By: Renne Crigler on 06/12/2018 11:25:57 [place], [name] ([id number]) -------------------------------------------------------------------------------- Vitals Details Patient Name: Marcia [name] Date of Service: 06/12/2018 11:00 AM Medical Record Number: [id number] Patient Account Number: [id number] Date of Birth/Sex: 06/20/1946 (72 y.o. F) Treating RN: Renne Crigler Primary Care Gene Glazebrook: Sherrie Mustache Other Clinician: Referring Reniya Mcclees: DASHER, DAVID Treating  Ginia Rudell/Extender: Linwood Dibbles, HOYT Weeks in Treatment: 4 Vital Signs Time Taken: 11:17 Temperature (F): 98.0 Height (in): 70 Pulse (bpm): 65 Weight (lbs): 193 Respiratory Rate (breaths/min): 18 Body Mass Index (BMI): 27.7 Blood Pressure (mmHg): 127/52 Reference Range: 80 - 120 mg / dl Electronic Signature(s) Signed: 06/12/2018 11:36:22 AM By: Renne Crigler Entered By: Renne Crigler on 06/12/2018 11:17:22

## 2018-06-15 NOTE — Progress Notes (Signed)
PARADISE, VENSEL ([id number]) Visit Report for 06/12/2018 Chief Complaint Document Details Patient Name: [name], [name] Date of Service: 06/12/2018 11:00 AM Medical Record Number: [id number] Patient Account Number: [id number] Date of Birth/Sex: 07-19-1946 (72 y.o. F) Treating RN: Curtis Sites Primary Care Provider: Sherrie Mustache Other Clinician: Referring Provider: DASHER, DAVID Treating Provider/Extender: Linwood Dibbles, Sarahgrace Broman Weeks in Treatment: 4 Information Obtained from: Patient Chief Complaint RLE wounds Electronic Signature(s) Signed: 06/14/2018 9:18:35 AM By: Lenda Kelp PA-C Entered By: Lenda Kelp on 06/12/2018 11:48:55 Rubel, Mathew AMarland Kitchen ([id number]) -------------------------------------------------------------------------------- Debridement Details Patient Name: Marcia [name] Date of Service: 06/12/2018 11:00 AM Medical Record Number: [id number] Patient Account Number: [id number] Date of Birth/Sex: 01-Feb-1946 (72 y.o. F) Treating RN: Curtis Sites Primary Care Provider: Sherrie Mustache Other Clinician: Referring Provider: DASHER, DAVID Treating Provider/Extender: Linwood Dibbles, Vinicius Brockman Weeks in Treatment: 4 Debridement Performed for Wound #4 Right,Medial Malleolus Assessment: Performed By: Physician STONE III, Nitza Schmid E., PA-C Debridement Type: Debridement Severity of Tissue Pre Fat layer exposed Debridement: Pre-procedure Verification/Time Yes - 11:55 Out Taken: Start Time: 11:55 Pain Control: Lidocaine 4% Topical Solution Total Area Debrided (L x W): 11.5 (cm) x 8 (cm) = 92 (cm) Tissue and other material Viable, Non-Viable, Slough, Subcutaneous, Skin: Dermis , Skin: Epidermis, Slough debrided: Level: Skin/Subcutaneous Tissue Debridement Description: Excisional Instrument: Curette, Forceps, Scissors Bleeding: Minimum Hemostasis Achieved: Pressure End Time: 12:03 Procedural Pain: 0 Post Procedural Pain: 0 Response to Treatment: Procedure was tolerated well Level of  Consciousness: Awake and Alert Post Debridement Measurements of Total Wound Length: (cm) 11.5 Width: (cm) 8 Depth: (cm) 0.2 Volume: (cm) 14.451 Character of Wound/Ulcer Post Debridement: Improved Severity of Tissue Post Debridement: Fat layer exposed Post Procedure Diagnosis Same as Pre-procedure Electronic Signature(s) Signed: 06/12/2018 5:28:07 PM By: Curtis Sites Signed: 06/14/2018 9:18:35 AM By: Lenda Kelp PA-C Entered By: Curtis Sites on 06/12/2018 12:03:21 Nabozny, [name] ([id number]) -------------------------------------------------------------------------------- Debridement Details Patient Name: Marcia [name] Date of Service: 06/12/2018 11:00 AM Medical Record Number: [id number] Patient Account Number: [id number] Date of Birth/Sex: 1946-02-11 (72 y.o. F) Treating RN: Curtis Sites Primary Care Provider: Sherrie Mustache Other Clinician: Referring Provider: DASHER, DAVID Treating Provider/Extender: Linwood Dibbles, Evgenia Merriman Weeks in Treatment: 4 Debridement Performed for Wound #3 Right,Posterior Lower Leg Assessment: Performed By: Physician STONE III, Baylin Cabal E., PA-C Debridement Type: Debridement Severity of Tissue Pre Fat layer exposed Debridement: Pre-procedure Verification/Time Yes - 12:03 Out Taken: Start Time: 12:03 Pain Control: Lidocaine 4% Topical Solution Total Area Debrided (L x W): 5.5 (cm) x 1.5 (cm) = 8.25 (cm) Tissue and other material Viable, Non-Viable, Slough, Subcutaneous, Slough debrided: Level: Skin/Subcutaneous Tissue Debridement Description: Excisional Instrument: Curette Bleeding: Minimum Hemostasis Achieved: Pressure End Time: 12:06 Procedural Pain: 0 Post Procedural Pain: 0 Response to Treatment: Procedure was tolerated well Level of Consciousness: Awake and Alert Post Debridement Measurements of Total Wound Length: (cm) 5.5 Width: (cm) 1.5 Depth: (cm) 0.2 Volume: (cm) 1.296 Character of Wound/Ulcer Post Debridement:  Improved Severity of Tissue Post Debridement: Fat layer exposed Post Procedure Diagnosis Same as Pre-procedure Electronic Signature(s) Signed: 06/12/2018 5:28:07 PM By: Curtis Sites Signed: 06/14/2018 9:18:35 AM By: Lenda Kelp PA-C Entered By: Curtis Sites on 06/12/2018 12:07:04 Mikula, [name] ([id number]) -------------------------------------------------------------------------------- HPI Details Patient Name: Marcia [name] Date of Service: 06/12/2018 11:00 AM Medical Record Number: [id number] Patient Account Number: [id number] Date of Birth/Sex: 09/05/1946 (72 y.o. F) Treating RN: Curtis Sites Primary Care Provider: Sherrie Mustache Other Clinician: Referring Provider: DASHER, DAVID Treating Provider/Extender: STONE III, Erice Ahles Weeks in  Treatment: 4 History of Present Illness HPI Description: 05/12/18-She is seen in initial evaluation for multiple open areas to the right lower extremity. She was recently hospitalized (7/30-8/2) with cellulitis of the right leg, failed outpatient therapy with Keflex. While hospitalized she was treated with IV vancomycin and cefepime; right foot x-ray was negative for osteomyelitis. She admits to intermittent pain, improved since hospitalization. She was discharged on doxycycline which she should complete with the next 48 hours. 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 She has a remote history of following with vascular medicine for venous reflux, she does not tolerate wearing compression stockings d/t skin sensitivities and "burning" sensation. In office ABI are elevated but she has strongly palpable pulses. We will initiate hydrofera blue and ace wrap compression. She is a diabetic with A1c 6.9 while hospitalized. 05/19/18- She is seen in follow up evaluation for RLE wounds; the lateral area is healed. She did tolerate ace wrap for compression, but  experienced increased xerosis. We will hold off on ace wrap and increase lachydrin to twice daily. She c/o pain to the medial cluster of wounds, there is no apparent infection and will initiate steroid cream. She will follow up next week 05/26/18 on evaluation today patient appears to actually be having some issues currently with pain. She has been utilizing the dressings as previously recommended for the lower extremities. With that being said she does appear to have a little bit of a rash she states that anytime it he's in for anything else touches her skin that she will often develop a rash. She's not even sure that should be able to tolerate a compression wrap due to the fact that again she would be concerned about even the cotton layer being in contact with her skin which overtime will cause a burning sensation. She has seen dermatology concerning this unfortunately they really did not have a good answer for her as to why this is occurring. Fortunately there is no evidence of infection at this time. 05/29/18 on evaluation today patient was actually supposed to be back in the office in order to have a dressings/wrap change is a nurse visit. With that being said she was having significant discomfort with want to touch around the wound and. It green discharge. Subsequently the concern was that she had an infection so we did convert her to a physician visit instead of just a nurse visit. Subsequently upon evaluation she does seem to have purulent discharge along with evidence of cellulitis extending up the leg which is definitely worse than when I saw her earlier in the week. Fortunately there does not appear to be any evidence of systemic infection at this point. No fevers, chills, nausea, or vomiting noted at this time. 06/02/18 on evaluation today the patient appears to be doing better in regard to the overall appearance in regard to the wound bed. She still does have slough covering the surface of  the wounds and due to pain really I do not think sharp debridement is going to be advisable at this point. Nonetheless I do feel like that she is definitely showing signs of improvement compared to her previous evaluation. Her pain level is still rather high she tells me at times 8-9/10. Unfortunately she is having a difficult time getting into pain management she states that the one that we referred her to stated it would be at least two weeks before they  can get her in. 06/12/18 on evaluation today patient actually appears to be doing much better in regard to her lower extremity wounds. In fact there does not appear to be any evidence of infection at this time I do believe the Cipro has been effective her pain is also greatly improved compared to previous. Nonetheless she does have some Slough noted that is gonna require debridement. Patient's MRI was reviewed and revealed that she had no evidence of osteomyelitis, septic arthritis, or absence. She did have blisters noted on the surface of the foot which again visually we see today as well. Electronic Signature(s) Signed: 06/14/2018 9:18:35 AM By: Lenda Kelp PA-C Entered By: Lenda Kelp on 06/12/2018 13:19:48 Briseno, [name] ([id number]) -------------------------------------------------------------------------------- Physical Exam Details Patient Name: Marcia [name] Date of Service: 06/12/2018 11:00 AM Medical Record Number: [id number] Patient Account Number: [id number] Date of Birth/Sex: [birth year] (72 y.o. F) Treating RN: Curtis Sites Primary Care Provider: Sherrie Mustache Other Clinician: Referring Provider: DASHER, DAVID Treating Provider/Extender: STONE III, Jashira Cotugno Weeks in Treatment: 4 Constitutional Well-nourished and well-hydrated in no acute distress. Respiratory normal breathing without difficulty. clear to auscultation bilaterally. Cardiovascular regular rate and rhythm with normal S1, S2. Psychiatric this patient is able  to make decisions and demonstrates good insight into disease process. Alert and Oriented x 3. pleasant and cooperative. Notes Currently I do feel like that the patient is shown signs again of excellent improvement in regard to the lower extremity and the wounds in particular that she has at this point. I do feel like that she's made good progress though with the initiation of the antibiotic she did have a few blisters that popped up but fortunately this does not appear to be anything significant. Remember I again was negative for any significant issues such as osteomyelitis or otherwise. Electronic Signature(s) Signed: 06/14/2018 9:18:35 AM By: Lenda Kelp PA-C Entered By: Lenda Kelp on 06/12/2018 13:18:52 Mitcham, [name] ([id number]) -------------------------------------------------------------------------------- Physician Orders Details Patient Name: Marcia [name] Date of Service: 06/12/2018 11:00 AM Medical Record Number: [id number] Patient Account Number: [id number] Date of Birth/Sex: 1946-02-25 (72 y.o. 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 o Cleanse wound with mild soap and water Wound #4 Right,Medial Malleolus o Clean wound with Normal Saline. o Cleanse wound with mild soap and water Anesthetic (add to Medication List) Wound #3 Right,Posterior Lower Leg o Topical Lidocaine 4% cream applied to wound bed prior to debridement (In Clinic  Only). Wound #4 Right,Medial Malleolus o Topical Lidocaine 4% cream applied to wound bed prior to debridement (In Clinic Only). Primary Wound Dressing Wound #3 Right,Posterior Lower Leg o Silver Collagen Wound #4 Right,Medial Malleolus o Silver Collagen Secondary Dressing Wound #3 Right,Posterior Lower Leg o Gauze, ABD and Kerlix/Conform - conform Wound #4 Right,Medial Malleolus o Gauze, ABD and Kerlix/Conform - conform Dressing Change Frequency Wound #3 Right,Posterior Lower Leg o Change dressing every other day. Wound #4 Right,Medial Malleolus Clemson, Meranda A. ([id number]) o Change dressing every other day. Follow-up Appointments Wound #3 Right,Posterior Lower Leg o Return Appointment in  1 week. Wound #4 Right,Medial Malleolus o Return Appointment in 1 week. Edema Control Wound #3 Right,Posterior Lower Leg o Elevate legs to the level of the heart and pump ankles as often as possible o Other: - ace wrap Wound #4 Right,Medial Malleolus o Elevate legs to the level of the heart and pump ankles as often as possible o Other: - ace wrap Additional Orders / Instructions Wound #3 Right,Posterior Lower Leg o Increase protein intake. Wound #4 Right,Medial Malleolus o Increase protein intake. Electronic Signature(s) Signed: 06/12/2018 5:28:07 PM By: Curtis Sites Signed: 06/14/2018 9:18:35 AM By: Lenda Kelp PA-C Entered By: Curtis Sites on 06/12/2018 12:08:03 Pompey, [name] ([id number]) -------------------------------------------------------------------------------- Problem List Details Patient Name: Marcia [name] Date of Service: 06/12/2018 11:00 AM Medical Record Number: [id number] Patient Account Number: [id number] Date of Birth/Sex: [birth year] (72 y.o. 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 05/12/2018 No Yes L97.212 Non-pressure chronic ulcer of right calf with fat layer exposed 05/12/2018 No Yes L97.312 Non-pressure chronic ulcer of right ankle with fat layer 05/12/2018 No Yes exposed E11.622 Type 2 diabetes mellitus with other skin ulcer 05/12/2018 No Yes Inactive Problems Resolved Problems Electronic Signature(s) Signed: 06/14/2018 9:18:35 AM By: Lenda Kelp PA-C Entered By: Lenda Kelp on 06/12/2018 11:48:49 Fiola, Monice AMarland Kitchen ([id number]) -------------------------------------------------------------------------------- Progress Note Details Patient Name: Marcia [name] Date of Service: 06/12/2018 11:00 AM Medical Record Number: [id number] Patient Account Number: [id number] Date of Birth/Sex: 14-May-1946 (72 y.o. F) Treating RN: Curtis Sites Primary Care Provider: Sherrie Mustache Other Clinician: Referring Provider: DASHER, DAVID Treating Provider/Extender: Linwood Dibbles, Kortnie Stovall Weeks in Treatment: 4 Subjective Chief Complaint Information obtained from Patient RLE wounds History of Present Illness (HPI) 05/12/18-She is seen in initial evaluation for multiple open areas to the right lower extremity. She was recently hospitalized (7/30-8/2) with cellulitis of the right leg, failed outpatient therapy with Keflex. While hospitalized she was treated with IV vancomycin and cefepime; right foot x-ray was negative for osteomyelitis. She admits to intermittent pain, improved since hospitalization. She was discharged on doxycycline which she should complete with the next 48 hours. 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 She has a remote history of following with vascular medicine for venous reflux, she does not tolerate wearing compression stockings d/t skin sensitivities and "burning"  sensation. In office ABI are elevated but she has strongly palpable pulses. We will initiate hydrofera blue and ace wrap compression. She is a diabetic with A1c 6.9 while hospitalized. 05/19/18- She is seen in follow up evaluation for RLE wounds; the lateral area is healed. She did tolerate ace wrap for compression, but experienced increased xerosis. We will hold off on ace wrap and increase lachydrin to twice daily. She c/o pain to the medial cluster of wounds, there is no apparent infection and will initiate steroid cream. She will follow up next week 05/26/18 on evaluation today patient appears to actually be having some issues currently with pain. She has been utilizing the dressings as previously recommended for the lower extremities. With that being said she does appear to have a little bit of a rash she states that anytime it he's in for anything else touches her skin that she will often develop a rash. She's not  even sure that should be able to tolerate a compression wrap due to the fact that again she would be concerned about even the cotton layer being in contact with her skin which overtime will cause a burning sensation. She has seen dermatology concerning this unfortunately they really did not have a good answer for her as to why this is occurring. Fortunately there is no evidence of infection at this time. 05/29/18 on evaluation today patient was actually supposed to be back in the office in order to have a dressings/wrap change is a nurse visit. With that being said she was having significant discomfort with want to touch around the wound and. It green discharge. Subsequently the concern was that she had an infection so we did convert her to a physician visit instead of just a nurse visit. Subsequently upon evaluation she does seem to have purulent discharge along with evidence of cellulitis extending up the leg which is definitely worse than when I saw her earlier in the week.  Fortunately there does not appear to be any evidence of systemic infection at this point. No fevers, chills, nausea, or vomiting noted at this time. 06/02/18 on evaluation today the patient appears to be doing better in regard to the overall appearance in regard to the wound bed. She still does have slough covering the surface of the wounds and due to pain really I do not think sharp debridement is going to be advisable at this point. Nonetheless I do feel like that she is definitely showing signs of improvement compared to her previous evaluation. Her pain level is still rather high she tells me at times 8-9/10. Unfortunately she is having a difficult time getting into pain management she states that the one that we referred her to stated it would be at least two weeks before they can get her in. 06/12/18 on evaluation today patient actually appears to be doing much better in regard to her lower extremity wounds. In fact there does not appear to be any evidence of infection at this time I do believe the Cipro has been effective her pain is also greatly improved compared to previous. Nonetheless she does have some Slough noted that is gonna require debridement. Patient's MRI was reviewed and revealed that she had no evidence of osteomyelitis, septic arthritis, or absence. She did have blisters noted on the surface of the foot which again visually we see today as well. BASIL, BLAKESLEY ([id number]) Patient History Information obtained from Patient. Family History Cancer - Siblings, Diabetes - Mother,Father,Siblings, Heart Disease - Father, Hypertension - Father, Stroke - Father,Mother, No family history of Kidney Disease, Lung Disease, Seizures, Thyroid Problems, Tuberculosis. Social History Former smoker - 10-15 years quit, Marital Status - Married, Alcohol Use - Never, Drug Use - No History, Caffeine Use - Daily. Medical And Surgical History Notes Constitutional Symptoms (General Health) vitamin d  deficiency Respiratory nodule of lung Cardiovascular dyspnea 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, AVV Musculoskeletal arthritis Review of Systems (ROS) Constitutional Symptoms (General Health) Denies complaints or symptoms of Fever, Chills. Respiratory The patient has no complaints or symptoms. Cardiovascular The patient has no complaints or symptoms. Psychiatric The patient has no complaints or symptoms. Objective Constitutional Well-nourished and well-hydrated in no acute distress. Vitals Time Taken: 11:17 AM, Height: 70 in, Weight: 193 lbs, BMI: 27.7, Temperature: 98.0 F, Pulse: 65 bpm, Respiratory Rate: 18 breaths/min, Blood Pressure: 127/52 mmHg. Respiratory normal breathing without difficulty. clear to auscultation bilaterally. Cardiovascular regular rate and rhythm  with normal S1, S2. Psychiatric this patient is able to make decisions and demonstrates good insight into disease process. Alert and Oriented x 3. pleasant and cooperative. LEIDA, LUTON AMarland Kitchen ([id number]) General Notes: Currently I do feel like that the patient is shown signs again of excellent improvement in regard to the lower extremity and the wounds in particular that she has at this point. I do feel like that she's made good progress though with the initiation of the antibiotic she did have a few blisters that popped up but fortunately this does not appear to be anything significant. Remember I again was negative for any significant issues such as osteomyelitis or otherwise. Integumentary (Hair, Skin) Wound #3 status is Open. Original cause of wound was Gradually Appeared. The wound is located on the Right,Posterior Lower Leg. The wound measures 5.5cm length x 1.5cm width x 0.2cm depth; 6.48cm^2 area and 1.296cm^3 volume. There is Fat Layer (Subcutaneous Tissue) Exposed exposed. There is no tunneling or undermining noted. There is a large amount of purulent drainage noted. The wound margin is flat and  intact. There is medium (34-66%) pink granulation within the wound bed. There is a medium (34-66%) amount of necrotic tissue within the wound bed including Eschar and Adherent Slough. The periwound skin appearance exhibited: Maceration, Hemosiderin Staining, Erythema. The periwound skin appearance did not exhibit: Callus, Crepitus, Excoriation, Induration, Rash, Scarring, Dry/Scaly, Atrophie Blanche, Cyanosis, Ecchymosis, Mottled, Pallor, Rubor. The surrounding wound skin color is noted with erythema which is circumferential. Periwound temperature was noted as No Abnormality. The periwound has tenderness on palpation. Wound #4 status is Open. Original cause of wound was Gradually Appeared. The wound is located on the Right,Medial Malleolus. The wound measures 11.5cm length x 8cm width x 0.2cm depth; 72.257cm^2 area and 14.451cm^3 volume. There is Fat Layer (Subcutaneous Tissue) Exposed exposed. There is no tunneling or undermining noted. There is a large amount of purulent drainage noted. The wound margin is flat and intact. There is small (1-33%) pink granulation within the wound bed. There is a large (67-100%) amount of necrotic tissue within the wound bed including Eschar and Adherent Slough. The periwound skin appearance exhibited: Induration, Hemosiderin Staining, Erythema. The periwound skin appearance did not exhibit: Callus, Crepitus, Excoriation, Rash, Scarring, Dry/Scaly, Maceration, Atrophie Blanche, Cyanosis, Ecchymosis, Mottled, Pallor, Rubor. The surrounding wound skin color is noted with erythema which is circumferential. Periwound temperature was noted as No Abnormality. The periwound has tenderness on palpation. 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 There was a Excisional Skin/Subcutaneous Tissue Debridement with a total area of 8.25 sq cm performed by STONE III, Yazmyne Sara E., PA-C. With the following instrument(s): Curette to remove Viable and Non-Viable tissue/material. Material removed includes Subcutaneous Tissue and Slough and after achieving pain control using Lidocaine 4% Topical Solution. No specimens were taken. A time out was conducted at 12:03, prior to the start of the procedure. A Minimum amount of bleeding was controlled with Pressure. The procedure was tolerated well with a pain level of 0 throughout and a pain level of 0 following the procedure. Patient s Level of Consciousness post procedure was recorded as Awake and Alert. Post Debridement Measurements: 5.5cm length x 1.5cm width x  0.2cm depth; 1.296cm^3 volume. Character of Wound/Ulcer Post Debridement is improved. Severity of Tissue Post Debridement is: Fat layer exposed. Post procedure Diagnosis Wound #3: Same as Pre-Procedure Madrazo, Lamaria A. 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 There was a Excisional Skin/Subcutaneous Tissue Debridement with a total area of 92 sq cm performed by STONE III, Jasiri Hanawalt E., PA-C. With the following instrument(s): Curette, Forceps, and Scissors to remove Viable and Non-Viable tissue/material. Material removed includes Subcutaneous Tissue, Slough, Skin: Dermis, and Skin: Epidermis after achieving pain control using Lidocaine 4% Topical Solution. No specimens were taken. A time out was conducted at 11:55, prior to the start of the procedure. A Minimum amount of bleeding was controlled with Pressure. The procedure was tolerated well with a pain level of 0 throughout and a pain level of 0  following the procedure. Patient s Level of Consciousness post procedure was recorded as Awake and Alert. Post Debridement Measurements: 11.5cm length x 8cm width x 0.2cm depth; 14.451cm^3 volume. Character of Wound/Ulcer Post Debridement is improved. Severity of Tissue Post Debridement is: Fat layer exposed. Post procedure Diagnosis Wound #4: Same as Pre-Procedure Plan Wound Cleansing: Wound #3 Right,Posterior Lower Leg: Clean wound with Normal Saline. Cleanse wound with mild soap and water Wound #4 Right,Medial Malleolus: Clean wound with Normal Saline. Cleanse wound with mild soap and water Anesthetic (add to Medication List): Wound #3 Right,Posterior Lower Leg: Topical Lidocaine 4% cream applied to wound bed prior to debridement (In Clinic Only). Wound #4 Right,Medial Malleolus: Topical Lidocaine 4% cream applied to wound bed prior to debridement (In Clinic Only). Primary Wound Dressing: Wound #3 Right,Posterior Lower Leg: Silver Collagen Wound #4 Right,Medial Malleolus: Silver Collagen Secondary Dressing: Wound #3 Right,Posterior Lower Leg: Gauze, ABD and Kerlix/Conform - conform Wound #4 Right,Medial Malleolus: Gauze, ABD and Kerlix/Conform - conform Dressing Change Frequency: Wound #3 Right,Posterior Lower Leg: Change dressing every other day. Wound #4 Right,Medial Malleolus: Change dressing every other day. Follow-up Appointments: Wound #3 Right,Posterior Lower Leg: Return Appointment in 1 week. Wound #4 Right,Medial Malleolus: Return Appointment in 1 week. Edema Control: Wound #3 Right,Posterior Lower Leg: Elevate legs to the level of the heart and pump ankles as often as possible Other: - ace wrap Wound #4 Right,Medial Malleolus: Elevate legs to the level of the heart and pump ankles as often as possible Brilliant, Lakea A. ([id number]) Other: - ace wrap Additional Orders / Instructions: Wound #3 Right,Posterior Lower Leg: Increase protein intake. Wound #4  Right,Medial Malleolus: Increase protein intake. At this point my suggestion is that we actually continue with the Current wound care measures with one change being that we are going to initiate treatment with a silver collagen dressing in order to hopefully help with new skin growth. She's in agreement with that plan. We will subsequently see her back for reevaluation next week to see were things stand. Please see above for specific wound care orders. We will see patient for re-evaluation in 1 week(s) here in the clinic. If anything worsens or changes patient will contact our office for additional recommendations. Electronic Signature(s) Signed: 06/14/2018 9:18:35 AM By: Lenda Kelp PA-C Entered By: Lenda Kelp on 06/12/2018 13:21:15 Oestreich, [name] ([id number]) -------------------------------------------------------------------------------- ROS/PFSH Details Patient Name: Marcia [name] Date of Service: 06/12/2018 11:00 AM Medical Record Number: [id number] Patient Account Number: [id number] Date of Birth/Sex: [birth year] (72 y.o. F) Treating RN: Curtis Sites Primary Care Provider: Dario Guardian,  [facility] ([facility]) Other Clinician: Referring Provider: DASHER, DAVID Treating Provider/Extender: STONE III, Nabor Thomann Weeks in Treatment: 4 Information Obtained From Patient Wound History Do you currently have one or more open woundso Yes How many open wounds do you currently haveo 5 Approximately how long have you had your woundso 6 How have you been treating your wound(s) until nowo abx Has your wound(s) ever healed and then re-openedo No Have you had any lab work done in the past montho Yes Who ordered the lab work doneo hospital Have you tested positive for an antibiotic resistant organism (MRSA, VRE)o No Have you tested positive for osteomyelitis (bone infection)o No Have you had any tests for circulation on your legso Yes Constitutional Symptoms (General Health) Complaints and Symptoms: Negative for:  Fever; Chills Medical History: Past Medical History Notes: vitamin d deficiency Eyes Medical History: Negative for: Cataracts; Glaucoma; Optic Neuritis Ear/Nose/Mouth/Throat Medical History: Negative for: Chronic sinus problems/congestion; Middle ear problems Hematologic/Lymphatic Medical History: Negative for: Anemia; Hemophilia; Human Immunodeficiency Virus; Lymphedema; Sickle Cell Disease Respiratory Complaints and Symptoms: No Complaints or Symptoms Medical History: Negative for: Aspiration; Asthma; Chronic Obstructive Pulmonary Disease (COPD); Pneumothorax; Sleep Apnea; Tuberculosis Past Medical History Notes: nodule of lung Zaman, Ritta A. ([id number]) Cardiovascular Complaints and Symptoms: No Complaints or Symptoms Medical History: Positive for: Hypertension Negative for: Angina; Arrhythmia; Congestive Heart Failure; Coronary Artery Disease; Deep Vein Thrombosis; Hypotension; Myocardial Infarction; Peripheral Arterial Disease; Peripheral Venous Disease; Phlebitis; Vasculitis Past Medical History Notes: dyspnea 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, AVV Gastrointestinal Medical History: Negative for: Cirrhosis ; Colitis; Crohnos; Hepatitis A; Hepatitis B; Hepatitis C Endocrine Medical History: Positive for: Type II Diabetes Time with diabetes: 10 years Treated with: Oral agents Blood sugar tested every day: Yes Tested : every mornimg Genitourinary Medical History: Negative for: End Stage Renal Disease Immunological Medical History: Negative for: Lupus Erythematosus; Raynaudos; Scleroderma Integumentary (Skin) Medical History: Negative for: History of Burn; History of pressure wounds Musculoskeletal Medical History: Negative for: Gout; Rheumatoid Arthritis; Osteoarthritis; Osteomyelitis Past Medical History Notes: arthritis Neurologic Medical History: Positive for: Neuropathy - peripheral Negative for: Dementia; Quadriplegia; Paraplegia; Seizure  Disorder Oncologic Medical History: Negative for: Received Chemotherapy; Received Radiation ARTIE, CANEPA ([id number]) Psychiatric Complaints and Symptoms: No Complaints or Symptoms Medical History: Negative for: Anorexia/bulimia; Confinement Anxiety Immunizations Pneumococcal Vaccine: Received Pneumococcal Vaccination: Yes Tetanus Vaccine: Last tetanus shot: 05/12/2014 Immunization Notes: plan on flu shot Implantable Devices Family and Social History Cancer: Yes - Siblings; Diabetes: Yes - Mother,Father,Siblings; Heart Disease: Yes - Father; Hypertension: Yes - Father; Kidney Disease: No; Lung Disease: No; Seizures: No; Stroke: Yes - Father,Mother; Thyroid Problems: No; Tuberculosis: No; Former smoker - 10-15 years quit; Marital Status - Married; Alcohol Use: Never; Drug Use: No History; Caffeine Use: Daily; Financial Concerns: No; Food, Clothing or Shelter Needs: No; Support System Lacking: No; Transportation Concerns: No; Advanced Directives: No; Patient does not want information on Advanced Directives; Living Will: No; Medical Power of Attorney: No Physician Affirmation I have reviewed and agree with the above information. Electronic Signature(s) Signed: 06/12/2018 5:28:07 PM By: Curtis Sites Signed: 06/14/2018 9:18:35 AM By: Lenda Kelp PA-C Entered By: Lenda Kelp on 06/12/2018 13:18:30 Smelser, Errin AMarland Kitchen ([id number]) -------------------------------------------------------------------------------- SuperBill Details Patient Name: Marcia [name] Date of Service: 06/12/2018 Medical Record Number: [id number] Patient Account Number: [id number] Date of Birth/Sex: 1946-01-06 (72 y.o. 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 E11.622 Type 2 diabetes mellitus with other skin ulcer Facility Procedures CPT4 Code: 78295621 Description: 11042 - DEB SUBQ TISSUE 20 SQ CM/< ICD-10 Diagnosis Description L97.212 Non-pressure chronic ulcer of right calf with fat layer exp L97.312 Non-pressure chronic ulcer of right ankle with fat layer ex Modifier: osed posed Quantity: 1 CPT4 Code: 30865784 Description: 11045 - DEB SUBQ TISS EA ADDL 20CM ICD-10 Diagnosis Description L97.212 Non-pressure chronic ulcer of right calf with fat layer exp L97.312 Non-pressure chronic ulcer of right ankle with fat layer ex Modifier: osed posed Quantity: 5 Physician Procedures CPT4 Code: 6962952 Description: 11042 - WC PHYS SUBQ TISS 20 SQ CM ICD-10 Diagnosis Description L97.212 Non-pressure chronic ulcer of right calf with fat layer expo L97.312 Non-pressure chronic ulcer of right ankle with fat layer exp Modifier: sed osed Quantity: 1 CPT4 Code: [id number] Description: 11045 - WC PHYS SUBQ TISS EA ADDL 20 CM ICD-10 Diagnosis Description L97.212 Non-pressure chronic ulcer of right calf with fat layer expo L97.312 Non-pressure chronic ulcer of right ankle with fat layer exp Modifier: sed osed Quantity: 5 Electronic Signature(s) Signed: 06/14/2018 9:18:35 AM By: Lenda Kelp PA-C Entered By: Lenda Kelp on 06/12/2018 13:21:45

## 2018-06-16 ENCOUNTER — Encounter: Payer: Medicare HMO | Admitting: Physician Assistant

## 2018-06-16 DIAGNOSIS — E11622 Type 2 diabetes mellitus with other skin ulcer: Secondary | ICD-10-CM | POA: Diagnosis not present

## 2018-06-17 NOTE — Progress Notes (Signed)
[name] ([id number]) Visit Report for 06/16/2018 Arrival Information Details Patient Name: [name], [name] Date of Service: 06/16/2018 11:15 AM Medical Record Number: [id number] Patient Account Number: [id number] Date of Birth/Sex: [birth year] (72 y.o. F) Treating RN: Curtis Sites Primary Care Katarzyna Wolven: [name] Other Clinician: Referring Xyon Lukasik: DASHER, DAVID Treating Blaire Palomino/Extender: Linwood Dibbles, HOYT Weeks in Treatment: 5 Visit Information History Since Last Visit Added or deleted any medications: No Patient Arrived: Ambulatory Any new allergies or adverse reactions: No Arrival Time: 11:23 Had a fall or experienced change in No Accompanied By: self activities of daily living that may affect Transfer Assistance: None risk of falls: Patient Identification Verified: Yes Signs or symptoms of abuse/neglect since last visito No Secondary Verification Process Completed: Yes Hospitalized since last visit: No Patient Requires Transmission-Based No Implantable device outside of the clinic excluding No Precautions: cellular tissue based products placed in the center Patient Has Alerts: Yes since last visit: Patient Alerts: DMII Pain Present Now: No Electronic Signature(s) Signed: 06/16/2018 4:54:17 PM By: Curtis Sites Previous Signature: 06/16/2018 11:45:15 AM Version By: Dayton Martes RCP, RRT, CHT Entered By: Curtis Sites on 06/16/2018 11:52:49 [name], [name] ([id number]) -------------------------------------------------------------------------------- Encounter Discharge Information Details Patient Name: Marcia [name] Date of Service: 06/16/2018 11:15 AM Medical Record Number: [id number] Patient Account Number: [id number] Date of Birth/Sex: December 19, 1945 (72 y.o. F) Treating RN: Curtis Sites Primary Care Zada Haser: [name] Other Clinician: Referring Janelle Spellman: DASHER, DAVID Treating Siri Buege/Extender: Linwood Dibbles, HOYT Weeks in Treatment:  5 Encounter Discharge Information Items Discharge Condition: Stable Ambulatory Status: Ambulatory Discharge Destination: Home Transportation: Private Auto Accompanied By: self Schedule Follow-up Appointment: Yes Clinical Summary of Care: Electronic Signature(s) Signed: 06/16/2018 4:54:17 PM By: Curtis Sites Entered By: Curtis Sites on 06/16/2018 14:10:44 [name] ([id number]) -------------------------------------------------------------------------------- Lower Extremity Assessment Details Patient Name: Marcia [name] Date of Service: 06/16/2018 11:15 AM Medical Record Number: [id number] Patient Account Number: [id number] Date of Birth/Sex: [birth year] (72 y.o. F) Treating RN: Rema Jasmine Primary Care Tremar Wickens: [name] Other Clinician: Referring Cesiah Westley: DASHER, DAVID Treating Xoe Hoe/Extender: Linwood Dibbles, HOYT Weeks in Treatment: 5 Edema Assessment Assessed: [Left: No] [Right: No] Edema: [Left: N] [Right: o] Calf Left: Right: Point of Measurement: 34 cm From Medial Instep cm 42 cm Ankle Left: Right: Point of Measurement: 12 cm From Medial Instep cm 21.5 cm Vascular Assessment Claudication: Claudication Assessment [Right:None] Pulses: Dorsalis Pedis Palpable: [Right:Yes] Posterior Tibial Extremity colors, hair growth, and conditions: Extremity Color: [Right:Hyperpigmented] Hair Growth on Extremity: [Right:No] Temperature of Extremity: [Right:Warm] Capillary Refill: [Right:< 3 seconds] Toe Nail Assessment Left: Right: Thick: Yes Discolored: Yes Deformed: Yes Improper Length and Hygiene: No Electronic Signature(s) Signed: 06/16/2018 3:05:43 PM By: Rema Jasmine Entered By: Rema Jasmine on 06/16/2018 11:45:53 [name], [name] ([id number]) -------------------------------------------------------------------------------- Multi Wound Chart Details Patient Name: Marcia [name] Date of Service: 06/16/2018 11:15 AM Medical Record Number: [id number] Patient Account  Number: [id number] Date of Birth/Sex: [birth year] (72 y.o. F) Treating RN: Curtis Sites Primary Care Jonie Burdell: [name] Other Clinician: Referring Anadalay Macdonell: DASHER, DAVID Treating Merina Behrendt/Extender: STONE III, HOYT Weeks in Treatment: 5 Vital Signs Height(in): 70 Pulse(bpm): 73 Weight(lbs): 193 Blood Pressure(mmHg): 137/54 Body Mass Index(BMI): 28 Temperature(F): 98.0 Respiratory Rate 18 (breaths/min): Photos: [N/A:N/A] Wound Location: Right Lower Leg - Posterior Right Malleolus - Medial N/A Wounding Event: Gradually Appeared Gradually Appeared N/A Primary Etiology: Vasculitis Vasculitis N/A Secondary Etiology: Diabetic Wound/Ulcer of the Diabetic Wound/Ulcer of the N/A Lower Extremity Lower Extremity Comorbid History: Hypertension, Type II Hypertension, Type II N/A Diabetes, Neuropathy  Diabetes, Neuropathy Date Acquired: 03/12/2018 03/12/2018 N/A Weeks of Treatment: 5 5 N/A Wound Status: Open Open N/A Clustered Wound: No Yes N/A Clustered Quantity: N/A 5 N/A Measurements L x W x D 5.5x1x0.2 10x8x0.2 N/A (cm) Area (cm) : 4.32 62.832 N/A Volume (cm) : 0.864 12.566 N/A % Reduction in Area: -307.50% -246.30% N/A % Reduction in Volume: -715.10% -246.30% N/A Classification: Full Thickness Without Full Thickness Without N/A Exposed Support Structures Exposed Support Structures Exudate Amount: Medium Large N/A Exudate Type: Purulent Purulent N/A Exudate Color: yellow, brown, green yellow, brown, green N/A Wound Margin: Flat and Intact Flat and Intact N/A Granulation Amount: Medium (34-66%) Small (1-33%) N/A Granulation Quality: Pink Pink N/A Necrotic Amount: Medium (34-66%) Large (67-100%) N/A Necrotic Tissue: Eschar, Adherent Slough Eschar, Adherent Slough N/A Exposed Structures: N/A [name], [name] ([id number]) Fat Layer (Subcutaneous Fat Layer (Subcutaneous Tissue) Exposed: Yes Tissue) Exposed: Yes Fascia: No Fascia: No Tendon: No Tendon: No Muscle: No Muscle:  No Joint: No Joint: No Bone: No Bone: No Epithelialization: Small (1-33%) Small (1-33%) N/A Periwound Skin Texture: Excoriation: No Induration: Yes N/A Induration: No Excoriation: No Callus: No Callus: No Crepitus: No Crepitus: No Rash: No Rash: No Scarring: No Scarring: No Periwound Skin Moisture: Maceration: Yes Maceration: No N/A Dry/Scaly: No Dry/Scaly: No Periwound Skin Color: Erythema: Yes Erythema: Yes N/A Hemosiderin Staining: Yes Hemosiderin Staining: Yes Atrophie Blanche: No Atrophie Blanche: No Cyanosis: No Cyanosis: No Ecchymosis: No Ecchymosis: No Mottled: No Mottled: No Pallor: No Pallor: No Rubor: No Rubor: No Erythema Location: Circumferential Circumferential N/A Temperature: No Abnormality No Abnormality N/A Tenderness on Palpation: Yes Yes N/A Wound Preparation: Ulcer Cleansing: Ulcer Cleansing: N/A Rinsed/Irrigated with Saline, Rinsed/Irrigated with Saline, Other: soap and water Other: soap and water Topical Anesthetic Applied: Topical Anesthetic Applied: Other: lidocaine 4% Other: lidocaine 4% Treatment Notes Electronic Signature(s) Signed: 06/16/2018 4:54:17 PM By: Curtis Sites Entered By: Curtis Sites on 06/16/2018 11:50:59 [name], [name] ([id number]) -------------------------------------------------------------------------------- Multi-Disciplinary Care Plan Details Patient Name: Marcia [name] Date of Service: 06/16/2018 11:15 AM Medical Record Number: [id number] Patient Account Number: [id number] Date of Birth/Sex: 11/08/1945 (72 y.o. F) Treating RN: Curtis Sites Primary Care Karlei Waldo: [name] Other Clinician: Referring Jerrald Doverspike: DASHER, DAVID Treating Inessa Wardrop/Extender: Linwood Dibbles, HOYT Weeks in Treatment: 5 Active Inactive ` Abuse / Safety / Falls / Self Care Management Nursing Diagnoses: Potential for falls Goals: Patient will not experience any injury related to falls Date Initiated: 05/12/2018 Target  Resolution Date: 08/15/2018 Goal Status: Active Interventions: Assess Activities of Daily Living upon admission and as needed Assess fall risk on admission and as needed Assess: immobility, friction, shearing, incontinence upon admission and as needed Assess impairment of mobility on admission and as needed per policy Assess personal safety and home safety (as indicated) on admission and as needed Notes: ` Nutrition Nursing Diagnoses: Imbalanced nutrition Impaired glucose control: actual or potential Goals: Patient/caregiver agrees to and verbalizes understanding of need to use nutritional supplements and/or vitamins as prescribed Date Initiated: 05/12/2018 Target Resolution Date: 09/12/2018 Goal Status: Active Patient/caregiver will maintain therapeutic glucose control Date Initiated: 05/12/2018 Target Resolution Date: 08/15/2018 Goal Status: Active Interventions: Assess patient nutrition upon admission and as needed per policy Provide education on elevated blood sugars and impact on wound healing Provide education on nutrition Notes: ALIZAYA, OSHEA ([id number]) Orientation to the Wound Care Program Nursing Diagnoses: Knowledge deficit related to the wound healing center program Goals: Patient/caregiver will verbalize understanding of the Wound Healing Center Program Date Initiated: 05/12/2018 Target Resolution Date:  06/13/2018 Goal Status: Active Interventions: Provide education on orientation to the wound center Notes: ` Wound/Skin Impairment Nursing Diagnoses: Impaired tissue integrity Knowledge deficit related to ulceration/compromised skin integrity Goals: Ulcer/skin breakdown will have a volume reduction of 80% by week 12 Date Initiated: 05/12/2018 Target Resolution Date: 09/05/2018 Goal Status: Active Interventions: Assess patient/caregiver ability to perform ulcer/skin care regimen upon admission and as needed Assess ulceration(s) every visit Notes: Electronic  Signature(s) Signed: 06/16/2018 4:54:17 PM By: Curtis Sites Entered By: Curtis Sites on 06/16/2018 11:50:46 [name], [name] ([id number]) -------------------------------------------------------------------------------- Pain Assessment Details Patient Name: Marcia [name] Date of Service: 06/16/2018 11:15 AM Medical Record Number: [id number] Patient Account Number: [id number] Date of Birth/Sex: [birth year] (72 y.o. F) Treating RN: Curtis Sites Primary Care Demetri Kerman: [name] Other Clinician: Referring Schuyler Olden: DASHER, DAVID Treating Nashid Pellum/Extender: Linwood Dibbles, HOYT Weeks in Treatment: 5 Active Problems Location of Pain Severity and Description of Pain Patient Has Paino No Site Locations Pain Management and Medication Current Pain Management: Electronic Signature(s) Signed: 06/16/2018 11:45:15 AM By: Dayton Martes RCP, RRT, CHT Signed: 06/16/2018 4:54:17 PM By: Curtis Sites Entered By: Dayton Martes on 06/16/2018 11:26:27 [name], [name] ([id number]) -------------------------------------------------------------------------------- Patient/Caregiver Education Details Patient Name: Marcia [name] Date of Service: 06/16/2018 11:15 AM Medical Record Number: [id number] Patient Account Number: [id number] Date of Birth/Gender: 1946-05-25 (72 y.o. F) Treating RN: Curtis Sites Primary Care Physician: [name] Other Clinician: Referring Physician: DASHER, DAVID Treating Physician/Extender: Skeet Simmer in Treatment: 5 Education Assessment Education Provided To: Patient Education Topics Provided Wound/Skin Impairment: Handouts: Other: wound care as ordered Methods: Demonstration, Explain/Verbal Responses: State content correctly Electronic Signature(s) Signed: 06/16/2018 4:54:17 PM By: Curtis Sites Entered By: Curtis Sites on 06/16/2018 14:11:00 [name], [name]  ([id number]) -------------------------------------------------------------------------------- Wound Assessment Details Patient Name: Marcia [name] Date of Service: 06/16/2018 11:15 AM Medical Record Number: [id number] Patient Account Number: [id number] Date of Birth/Sex: 11/28/1945 (72 y.o. F) Treating RN: Rema Jasmine Primary Care Kashlyn Salinas: [name] Other Clinician: Referring Shantele Reller: DASHER, DAVID Treating Fajr Fife/Extender: Linwood Dibbles, HOYT Weeks in Treatment: 5 Wound Status Wound Number: 3 Primary Etiology: Vasculitis Wound Location: Right Lower Leg - Posterior Secondary Diabetic Wound/Ulcer of the Lower Etiology: Extremity Wounding Event: Gradually Appeared Wound Status: Open Date Acquired: 03/12/2018 Comorbid History: Hypertension, Type II Diabetes, Weeks Of Treatment: 5 Neuropathy Clustered Wound: No Photos Photo Uploaded By: Rema Jasmine on 06/16/2018 11:49:45 Wound Measurements Length: (cm) 5.5 Width: (cm) 1 Depth: (cm) 0.2 Area: (cm) 4.32 Volume: (cm) 0.864 % Reduction in Area: -307.5% % Reduction in Volume: -715.1% Epithelialization: Small (1-33%) Tunneling: No Undermining: No Wound Description Full Thickness Without Exposed Support Classification: Structures Wound Margin: Flat and Intact Exudate Medium Amount: Exudate Type: Purulent Exudate Color: yellow, brown, green Foul Odor After Cleansing: No Slough/Fibrino Yes Wound Bed Granulation Amount: Medium (34-66%) Exposed Structure Granulation Quality: Pink Fascia Exposed: No Necrotic Amount: Medium (34-66%) Fat Layer (Subcutaneous Tissue) Exposed: Yes Necrotic Quality: Eschar, Adherent Slough Tendon Exposed: No Muscle Exposed: No Joint Exposed: No Bone Exposed: No [name], [name] ([id number]) Periwound Skin Texture Texture Color No Abnormalities Noted: No No Abnormalities Noted: No Callus: No Atrophie Blanche: No Crepitus: No Cyanosis: No Excoriation: No Ecchymosis: No Induration:  No Erythema: Yes Rash: No Erythema Location: Circumferential Scarring: No Hemosiderin Staining: Yes Mottled: No Moisture Pallor: No No Abnormalities Noted: No Rubor: No Dry / Scaly: No Maceration: Yes Temperature / Pain Temperature: No Abnormality Tenderness on Palpation: Yes Wound Preparation Ulcer Cleansing: Rinsed/Irrigated with Saline, Other: soap and water,  Topical Anesthetic Applied: Other: lidocaine 4%, Treatment Notes Wound #3 (Right, Posterior Lower Leg) 1. Cleansed with: Clean wound with Normal Saline 2. Anesthetic Topical Lidocaine 4% cream to wound bed prior to debridement 4. Dressing Applied: Santyl Ointment 5. Secondary Dressing Applied ABD Pad Kerlix/Conform Saline moistened guaze 7. Secured with Tape Notes ACE wrap Electronic Signature(s) Signed: 06/16/2018 3:05:43 PM By: Rema Jasmine Entered By: Rema Jasmine on 06/16/2018 11:42:54 [name], [name] ([id number]) -------------------------------------------------------------------------------- Wound Assessment Details Patient Name: Marcia [name] Date of Service: 06/16/2018 11:15 AM Medical Record Number: [id number] Patient Account Number: [id number] Date of Birth/Sex: 05/30/1946 (72 y.o. F) Treating RN: Rema Jasmine Primary Care Beronica Lansdale: [name] Other Clinician: Referring Kailani Brass: DASHER, DAVID Treating Cammeron Greis/Extender: Linwood Dibbles, HOYT Weeks in Treatment: 5 Wound Status Wound Number: 4 Primary Etiology: Vasculitis Wound Location: Right Malleolus - Medial Secondary Diabetic Wound/Ulcer of the Lower Etiology: Extremity Wounding Event: Gradually Appeared Wound Status: Open Date Acquired: 03/12/2018 Comorbid History: Hypertension, Type II Diabetes, Weeks Of Treatment: 5 Neuropathy Clustered Wound: Yes Photos Photo Uploaded By: Rema Jasmine on 06/16/2018 11:49:46 Wound Measurements Length: (cm) 10 Width: (cm) 8 Depth: (cm) 0.2 Clustered Quantity: 5 Area: (cm) 62.832 Volume: (cm) 12.566 %  Reduction in Area: -246.3% % Reduction in Volume: -246.3% Epithelialization: Small (1-33%) Tunneling: No Undermining: No Wound Description Full Thickness Without Exposed Support Classification: Structures Wound Margin: Flat and Intact Exudate Large Amount: Exudate Type: Purulent Exudate Color: yellow, brown, green Foul Odor After Cleansing: No Slough/Fibrino Yes Wound Bed Granulation Amount: Small (1-33%) Exposed Structure Granulation Quality: Pink Fascia Exposed: No Necrotic Amount: Large (67-100%) Fat Layer (Subcutaneous Tissue) Exposed: Yes Necrotic Quality: Eschar, Adherent Slough Tendon Exposed: No Muscle Exposed: No Joint Exposed: No [name], [name] ([id number]) Bone Exposed: No Periwound Skin Texture Texture Color No Abnormalities Noted: No No Abnormalities Noted: No Callus: No Atrophie Blanche: No Crepitus: No Cyanosis: No Excoriation: No Ecchymosis: No Induration: Yes Erythema: Yes Rash: No Erythema Location: Circumferential Scarring: No Hemosiderin Staining: Yes Mottled: No Moisture Pallor: No No Abnormalities Noted: No Rubor: No Dry / Scaly: No Maceration: No Temperature / Pain Temperature: No Abnormality Tenderness on Palpation: Yes Wound Preparation Ulcer Cleansing: Rinsed/Irrigated with Saline, Other: soap and water, Topical Anesthetic Applied: Other: lidocaine 4%, Treatment Notes Wound #4 (Right, Medial Malleolus) 1. Cleansed with: Clean wound with Normal Saline 2. Anesthetic Topical Lidocaine 4% cream to wound bed prior to debridement 4. Dressing Applied: Santyl Ointment 5. Secondary Dressing Applied ABD Pad Kerlix/Conform Saline moistened guaze 7. Secured with Tape Notes ACE wrap Electronic Signature(s) Signed: 06/16/2018 3:05:43 PM By: Rema Jasmine Entered By: Rema Jasmine on 06/16/2018 11:44:17 [name], Cindi [name] [name] ([id number]) -------------------------------------------------------------------------------- Vitals Details Patient  Name: Marcia [name] Date of Service: 06/16/2018 11:15 AM Medical Record Number: [id number] Patient Account Number: [id number] Date of Birth/Sex: Feb 10, 1946 (72 y.o. F) Treating RN: Curtis Sites Primary Care Cam Dauphin: [name] Other Clinician: Referring Rolondo Pierre: DASHER, DAVID Treating Conner Neiss/Extender: STONE III, HOYT Weeks in Treatment: 5 Vital Signs Time Taken: 11:25 Temperature (F): 98.0 Height (in): 70 Pulse (bpm): 73 Weight (lbs): 193 Respiratory Rate (breaths/min): 18 Body Mass Index (BMI): 27.7 Blood Pressure (mmHg): 137/54 Reference Range: 80 - 120 mg / dl Electronic Signature(s) Signed: 06/16/2018 11:45:15 AM By: Dayton Martes RCP, RRT, CHT Entered By: Weyman Rodney, Lucio Edward on 06/16/2018 11:28:20

## 2018-06-17 NOTE — Progress Notes (Signed)
[name], [name] ([id number]) Visit Report for 06/16/2018 Chief Complaint Document Details Patient Name: [name], [name] Date of Service: 06/16/2018 11:15 AM Medical Record Number: [id number] Patient Account Number: [id number] Date of Birth/Sex: [birth year] (72 y.o. F) Treating RN: Curtis Sites Primary Care Provider: Sherrie Mustache Other Clinician: Referring Provider: DASHER, DAVID Treating Provider/Extender: Linwood Dibbles, HOYT Weeks in Treatment: 5 Information Obtained from: Patient Chief Complaint RLE wounds Electronic Signature(s) Signed: 06/16/2018 1:59:57 PM By: Lenda Kelp PA-C Entered By: Lenda Kelp on 06/16/2018 11:49:13 [name], [name] ([id number]) -------------------------------------------------------------------------------- Debridement Details Patient Name: Marcia [name] Date of Service: 06/16/2018 11:15 AM Medical Record Number: [id number] Patient Account Number: [id number] Date of Birth/Sex: 1946/01/23 (72 y.o. F) Treating RN: Curtis Sites Primary Care Provider: Sherrie Mustache Other Clinician: Referring Provider: DASHER, DAVID Treating Provider/Extender: Linwood Dibbles, HOYT Weeks in Treatment: 5 Debridement Performed for Wound #3 Right,Posterior Lower Leg Assessment: Performed By: Physician STONE III, HOYT E., PA-C Debridement Type: Chemical/Enzymatic/Mechanical Agent Used: Santyl Severity of Tissue Pre Fat layer exposed Debridement: Pre-procedure Verification/Time Yes - 12:03 Out Taken: Start Time: 12:03 Pain Control: Lidocaine 4% Topical Solution Instrument: Other : tongue depressor Bleeding: None End Time: 12:04 Procedural Pain: 0 Post Procedural Pain: 0 Response to Treatment: Procedure was tolerated well Level of Consciousness: Awake and Alert Post Debridement Measurements of Total Wound Length: (cm) 5.5 Width: (cm) 1 Depth: (cm) 0.2 Volume: (cm) 0.864 Character of Wound/Ulcer Post Debridement: Improved Severity of Tissue Post Debridement: Fat  layer exposed Post Procedure Diagnosis Same as Pre-procedure Electronic Signature(s) Signed: 06/16/2018 1:59:57 PM By: Lenda Kelp PA-C Signed: 06/16/2018 4:54:17 PM By: Curtis Sites Entered By: Curtis Sites on 06/16/2018 12:04:17 [name], [name] ([id number]) -------------------------------------------------------------------------------- Debridement Details Patient Name: Marcia [name] Date of Service: 06/16/2018 11:15 AM Medical Record Number: [id number] Patient Account Number: [id number] Date of Birth/Sex: 1946/07/19 (72 y.o. F) Treating RN: Curtis Sites Primary Care Provider: Sherrie Mustache Other Clinician: Referring Provider: DASHER, DAVID Treating Provider/Extender: Linwood Dibbles, HOYT Weeks in Treatment: 5 Debridement Performed for Wound #4 Right,Medial Malleolus Assessment: Performed By: Physician STONE III, HOYT E., PA-C Debridement Type: Chemical/Enzymatic/Mechanical Agent Used: Santyl Severity of Tissue Pre Fat layer exposed Debridement: Pre-procedure Verification/Time Yes - 12:04 Out Taken: Start Time: 12:04 Pain Control: Lidocaine 4% Topical Solution Instrument: Other : tongue depressor Bleeding: None End Time: 12:05 Procedural Pain: 0 Post Procedural Pain: 0 Response to Treatment: Procedure was tolerated well Level of Consciousness: Awake and Alert Post Debridement Measurements of Total Wound Length: (cm) 10 Width: (cm) 8 Depth: (cm) 0.2 Volume: (cm) 12.566 Character of Wound/Ulcer Post Debridement: Improved Severity of Tissue Post Debridement: Fat layer exposed Post Procedure Diagnosis Same as Pre-procedure Electronic Signature(s) Signed: 06/16/2018 1:59:57 PM By: Lenda Kelp PA-C Signed: 06/16/2018 4:54:17 PM By: Curtis Sites Entered By: Curtis Sites on 06/16/2018 12:05:03 [name], [name] ([id number]) -------------------------------------------------------------------------------- HPI Details Patient Name: Marcia [name] Date of  Service: 06/16/2018 11:15 AM Medical Record Number: [id number] Patient Account Number: [id number] Date of Birth/Sex: [birth year] (72 y.o. F) Treating RN: Curtis Sites Primary Care Provider: Sherrie Mustache Other Clinician: Referring Provider: DASHER, DAVID Treating Provider/Extender: Linwood Dibbles, HOYT Weeks in Treatment: 5 History of Present Illness HPI Description: 05/12/18-She is seen in initial evaluation for multiple open areas to the right lower extremity. She was recently hospitalized (7/30-8/2) with cellulitis of the right leg, failed outpatient therapy with Keflex. While hospitalized she was treated with IV vancomycin and cefepime; right foot x-ray was negative for osteomyelitis. She admits to intermittent pain,  improved since hospitalization. She was discharged on doxycycline which she should complete with the next 48 hours. 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 She has a remote history of following with vascular medicine for venous reflux, she does not tolerate wearing compression stockings d/t skin sensitivities and "burning" sensation. In office ABI are elevated but she has strongly palpable pulses. We will initiate hydrofera blue and ace wrap compression. She is a diabetic with A1c 6.9 while hospitalized. 05/19/18- She is seen in follow up evaluation for RLE wounds; the lateral area is healed. She did tolerate ace wrap for compression, but experienced increased xerosis. We will hold off on ace wrap and increase lachydrin to twice daily. She c/o pain to the medial cluster of wounds, there is no apparent infection and will initiate steroid cream. She will follow up next week 05/26/18 on evaluation today patient appears to actually be having some issues currently with pain. She has been utilizing the dressings as previously recommended for the lower extremities. With that being said she does  appear to have a little bit of a rash she states that anytime it he's in for anything else touches her skin that she will often develop a rash. She's not even sure that should be able to tolerate a compression wrap due to the fact that again she would be concerned about even the cotton layer being in contact with her skin which overtime will cause a burning sensation. She has seen dermatology concerning this unfortunately they really did not have a good answer for her as to why this is occurring. Fortunately there is no evidence of infection at this time. 05/29/18 on evaluation today patient was actually supposed to be back in the office in order to have a dressings/wrap change is a nurse visit. With that being said she was having significant discomfort with want to touch around the wound and. It green discharge. Subsequently the concern was that she had an infection so we did convert her to a physician visit instead of just a nurse visit. Subsequently upon evaluation she does seem to have purulent discharge along with evidence of cellulitis extending up the leg which is definitely worse than when I saw her earlier in the week. Fortunately there does not appear to be any evidence of systemic infection at this point. No fevers, chills, nausea, or vomiting noted at this time. 06/02/18 on evaluation today the patient appears to be doing better in regard to the overall appearance in regard to the wound bed. She still does have slough covering the surface of the wounds and due to pain really I do not think sharp debridement is going to be advisable at this point. Nonetheless I do feel like that she is definitely showing signs of improvement compared to her previous evaluation. Her pain level is still rather high she tells me at times 8-9/10. Unfortunately she is having a difficult time getting into pain management she states that the one that we referred her to stated it would be at least two weeks  before they can get her in. 06/12/18 on evaluation today patient actually appears to be doing much better in regard to her lower extremity wounds. In fact there does not appear to be any evidence of infection at this time I do believe the Cipro has been effective her pain is also greatly improved compared to previous. Nonetheless she does have some [facility]  noted that is gonna require debridement. Patient's MRI was reviewed and revealed that she had no evidence of osteomyelitis, septic arthritis, or absence. She did have blisters noted on the surface of the foot which again visually we see today as well. 06/16/18 on evaluation today patient actually appears to be doing fairly well in regard to her lower extremity ulcerations. With that being said she does have issues at this point with discomfort although she did see the pain management physician this morning he did give her medication to help in this regard. She tells me that after I debrided her wound last week that she actually had significant pain to the point that she ended up having a difficult time even driving to get home. Obviously when she left the clinic she was not in that much pain this somewhat surprises me especially since we put lidocaine on her wounds prior to applying the dressings and the dressing we were using was Prisma which is not notorious for causing discomfort and Dormer, Countess A. ([id number]) burning. Nonetheless she did obviously experience this and she tells me that she set intermittent spells where this occurred in the week since I last saw her. Nonetheless she has been given the pain medications necessary in order to keep things under control at this point. Obviously this is short term but nonetheless I think will be helpful in getting the wounds to heal so that we can appropriately debrided do what we need to do. Electronic Signature(s) Signed: 06/16/2018 1:59:57 PM By: Lenda Kelp PA-C Entered By: Lenda Kelp on  06/16/2018 13:53:21 [name], [name] ([id number]) -------------------------------------------------------------------------------- Physical Exam Details Patient Name: Marcia [name] Date of Service: 06/16/2018 11:15 AM Medical Record Number: [id number] Patient Account Number: [id number] Date of Birth/Sex: 1946/05/30 (72 y.o. F) Treating RN: Curtis Sites Primary Care Provider: Sherrie Mustache Other Clinician: Referring Provider: DASHER, DAVID Treating Provider/Extender: STONE III, HOYT Weeks in Treatment: 5 Constitutional Well-nourished and well-hydrated in no acute distress. Respiratory normal breathing without difficulty. clear to auscultation bilaterally. Cardiovascular regular rate and rhythm with normal S1, S2. 1+ pitting edema of the bilateral lower extremities. Psychiatric this patient is able to make decisions and demonstrates good insight into disease process. Alert and Oriented x 3. pleasant and cooperative. Notes Patient's wound bed shows evidence of some Slough noted over the surface of the wound. She just got her pain medication today or rather the prescription she has not gotten that field yet. She would prefer to hold off any additional debridement until she's able to feel the medication which I think it's okay. Nonetheless I do believe that potentially she may benefit from [facility] as opposed to just continuing with the Prisma based on the fact that she is continuing to build slough up on the surface of the wounds. I also think she may tolerate with the pain medication compression wrapping which would also be of benefit for her or ABI seem to be indicative of toleration of this. Electronic Signature(s) Signed: 06/16/2018 1:59:57 PM By: Lenda Kelp PA-C Entered By: Lenda Kelp on 06/16/2018 13:55:04 [name], [name] ([id number]) -------------------------------------------------------------------------------- Physician Orders Details Patient Name: Marcia [name] Date  of Service: 06/16/2018 11:15 AM Medical Record Number: [id number] Patient Account Number: [id number] Date of Birth/Sex: [birth year] (72 y.o. 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 o Cleanse wound with mild soap and water Wound #4 Right,Medial Malleolus o Clean wound with Normal Saline. o Cleanse wound with mild soap and water Anesthetic (add to Medication List) Wound #3 Right,Posterior Lower Leg o Topical Lidocaine 4% cream applied to wound bed prior to debridement (In Clinic Only). Wound #4 Right,Medial Malleolus o Topical Lidocaine 4% cream applied to wound bed prior to debridement (In Clinic Only). Primary Wound Dressing Wound #3 Right,Posterior Lower Leg o Santyl Ointment o Other: - if the santyl is too expensive please use the hydrafera blue foam dressing instead Wound #4 Right,Medial Malleolus o Santyl Ointment o Other: - if the santyl is too expensive please use the hydrafera blue foam dressing instead Secondary Dressing Wound #3 Right,Posterior Lower Leg o Gauze, ABD and Kerlix/Conform - conform, gauze moistened with saline Wound #4 Right,Medial Malleolus o Gauze, ABD and Kerlix/Conform - conform, gauze moistened with saline Dressing Change Frequency Wound #3 Right,Posterior Lower Leg o Change dressing every day. [name], [name] ([id number]) Wound #4 Right,Medial Malleolus o Change dressing every day. Follow-up  Appointments Wound #3 Right,Posterior Lower Leg o Return Appointment in 1 week. Wound #4 Right,Medial Malleolus o Return Appointment in 1 week. Edema Control Wound #3 Right,Posterior Lower Leg o Elevate legs to the level of the heart and pump ankles as often as possible o Other: - ace wrap Wound #4 Right,Medial Malleolus o Elevate legs to the level of the heart and pump ankles as often as possible o Other: - ace wrap Additional Orders / Instructions Wound #3 Right,Posterior Lower Leg o Increase protein intake. Wound #4 Right,Medial Malleolus o Increase protein intake. Patient Medications Allergies: No Known Allergies Notifications Medication Indication Start End Santyl 06/16/2018 DOSE topical 250 unit/gram ointment - ointment topical applied nickel thick to the wound bed and then cover with a dressing as directed Electronic Signature(s) Signed: 06/16/2018 12:08:45 PM By: Lenda Kelp PA-C Entered By: Lenda Kelp on 06/16/2018 12:08:44 [name], [name] ([id number]) -------------------------------------------------------------------------------- Problem List Details Patient Name: Marcia [name] Date of Service: 06/16/2018 11:15 AM Medical Record Number: [id number] Patient Account Number: [id number] Date of Birth/Sex: 1946-01-20 (72 y.o. 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 05/12/2018 No Yes L97.212 Non-pressure chronic ulcer of right calf with fat layer exposed 05/12/2018 No Yes L97.312 Non-pressure chronic ulcer of right ankle with fat layer 05/12/2018 No Yes exposed E11.622 Type 2 diabetes mellitus with other skin ulcer 05/12/2018 No Yes Inactive Problems Resolved Problems Electronic  Signature(s) Signed: 06/16/2018 1:59:57 PM By: Lenda Kelp PA-C Entered By: Lenda Kelp on 06/16/2018 11:49:06 Eichorn, Jemima AMarland Kitchen ([id number]) -------------------------------------------------------------------------------- Progress Note Details Patient Name: Marcia [name] Date of Service: 06/16/2018 11:15 AM Medical Record Number: [id number] Patient Account Number: [id number] Date of Birth/Sex: 1946/01/10 (72 y.o. F) Treating RN: Curtis Sites Primary Care Provider: Sherrie Mustache Other Clinician: Referring Provider: DASHER, DAVID Treating Provider/Extender: Linwood Dibbles, HOYT Weeks in Treatment: 5 Subjective Chief Complaint Information obtained from Patient RLE wounds History of Present Illness (HPI) 05/12/18-She is seen in initial evaluation for multiple open  areas to the right lower extremity. She was recently hospitalized (7/30-8/2) with cellulitis of the right leg, failed outpatient therapy with Keflex. While hospitalized she was treated with IV vancomycin and cefepime; right foot x-ray was negative for osteomyelitis. She admits to intermittent pain, improved since hospitalization. She was discharged on doxycycline which she should complete with the next 48 hours. 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 She has a remote history of following with vascular medicine for venous reflux, she does not tolerate wearing compression stockings d/t skin sensitivities and "burning" sensation. In office ABI are elevated but she has strongly palpable pulses. We will initiate hydrofera blue and ace wrap compression. She is a diabetic with A1c 6.9 while hospitalized. 05/19/18- She is seen in follow up evaluation for RLE wounds; the lateral area is healed. She did tolerate ace wrap for compression, but experienced increased xerosis. We will hold off on ace wrap and increase lachydrin to twice daily. She  c/o pain to the medial cluster of wounds, there is no apparent infection and will initiate steroid cream. She will follow up next week 05/26/18 on evaluation today patient appears to actually be having some issues currently with pain. She has been utilizing the dressings as previously recommended for the lower extremities. With that being said she does appear to have a little bit of a rash she states that anytime it he's in for anything else touches her skin that she will often develop a rash. She's not even sure that should be able to tolerate a compression wrap due to the fact that again she would be concerned about even the cotton layer being in contact with her skin which overtime will cause a burning sensation. She has seen dermatology concerning this unfortunately they really did not have a good answer for her as to why this is occurring. Fortunately there is no evidence of infection at this time. 05/29/18 on evaluation today patient was actually supposed to be back in the office in order to have a dressings/wrap change is a nurse visit. With that being said she was having significant discomfort with want to touch around the wound and. It green discharge. Subsequently the concern was that she had an infection so we did convert her to a physician visit instead of just a nurse visit. Subsequently upon evaluation she does seem to have purulent discharge along with evidence of cellulitis extending up the leg which is definitely worse than when I saw her earlier in the week. Fortunately there does not appear to be any evidence of systemic infection at this point. No fevers, chills, nausea, or vomiting noted at this time. 06/02/18 on evaluation today the patient appears to be doing better in regard to the overall appearance in regard to the wound bed. She still does have slough covering the surface of the wounds and due to pain really I do not think sharp debridement is going to be advisable at this  point. Nonetheless I do feel like that she is definitely showing signs of improvement compared to her previous evaluation. Her pain level is still rather high she tells me at times 8-9/10. Unfortunately she is having a difficult time getting into pain management she states that the one that we referred her to stated it would be at least two weeks before they can get her in. 06/12/18 on evaluation today patient actually appears to be doing much better in  regard to her lower extremity wounds. In fact there does not appear to be any evidence of infection at this time I do believe the Cipro has been effective her pain is also greatly improved compared to previous. Nonetheless she does have some Slough noted that is gonna require debridement. Patient's MRI was reviewed and revealed that she had no evidence of osteomyelitis, septic arthritis, or absence. She did have blisters noted on the surface of the foot which again visually we see today as well. AFTAN, VINT ([id number]) 06/16/18 on evaluation today patient actually appears to be doing fairly well in regard to her lower extremity ulcerations. With that being said she does have issues at this point with discomfort although she did see the pain management physician this morning he did give her medication to help in this regard. She tells me that after I debrided her wound last week that she actually had significant pain to the point that she ended up having a difficult time even driving to get home. Obviously when she left the clinic she was not in that much pain this somewhat surprises me especially since we put lidocaine on her wounds prior to applying the dressings and the dressing we were using was Prisma which is not notorious for causing discomfort and burning. Nonetheless she did obviously experience this and she tells me that she set intermittent spells where this occurred in the week since I last saw her. Nonetheless she has been given the pain  medications necessary in order to keep things under control at this point. Obviously this is short term but nonetheless I think will be helpful in getting the wounds to heal so that we can appropriately debrided do what we need to do. Patient History Information obtained from Patient. Family History Cancer - Siblings, Diabetes - Mother,Father,Siblings, Heart Disease - Father, Hypertension - Father, Stroke - Father,Mother, No family history of Kidney Disease, Lung Disease, Seizures, Thyroid Problems, Tuberculosis. Social History Former smoker - 10-15 years quit, Marital Status - Married, Alcohol Use - Never, Drug Use - No History, Caffeine Use - Daily. 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 Respiratory The patient has no complaints or symptoms. Cardiovascular Complains or has symptoms of LE edema. Psychiatric The patient has no complaints or symptoms. Objective Constitutional Well-nourished and well-hydrated in no acute distress. Vitals Time Taken: 11:25 AM, Height: 70 in, Weight: 193 lbs, BMI: 27.7, Temperature: 98.0 F, Pulse: 73 bpm, Respiratory Rate: 18 breaths/min, Blood Pressure: 137/54 mmHg. Boden, Rejeana A. ([id number]) Respiratory normal breathing without difficulty. clear to auscultation bilaterally. Cardiovascular regular rate and rhythm with normal S1, S2. 1+ pitting edema of the bilateral lower extremities. Psychiatric this patient is able to make decisions and demonstrates good insight into disease process. Alert and Oriented x 3. pleasant and cooperative. General Notes: Patient's wound bed shows evidence of some Slough noted over the surface of the wound. She just got  her pain medication today or rather the prescription she has not gotten that field yet. She would prefer to hold off any additional debridement until she's able to feel the medication which I think it's okay. Nonetheless I do believe that potentially she may benefit from [place] as opposed to just continuing with the Prisma based on the fact that she is continuing to  build slough up on the surface of the wounds. I also think she may tolerate with the pain medication compression wrapping which would also be of benefit for her or ABI seem to be indicative of toleration of this. Integumentary (Hair, Skin) Wound #3 status is Open. Original cause of wound was Gradually Appeared. The wound is located on the Right,Posterior Lower Leg. The wound measures 5.5cm length x 1cm width x 0.2cm depth; 4.32cm^2 area and 0.864cm^3 volume. There is Fat Layer (Subcutaneous Tissue) Exposed exposed. There is no tunneling or undermining noted. There is a medium amount of purulent drainage noted. The wound margin is flat and intact. There is medium (34-66%) pink granulation within the wound bed. There is a medium (34-66%) amount of necrotic tissue within the wound bed including Eschar and Adherent Slough. The periwound skin appearance exhibited: Maceration, Hemosiderin Staining, Erythema. The periwound skin appearance did not exhibit: Callus, Crepitus, Excoriation, Induration, Rash, Scarring, Dry/Scaly, Atrophie Blanche, Cyanosis, Ecchymosis, Mottled, Pallor, Rubor. The surrounding wound skin color is noted with erythema which is circumferential. Periwound temperature was noted as No Abnormality. The periwound has tenderness on palpation. Wound #4 status is Open. Original cause of wound was Gradually Appeared. The wound is located on the Right,Medial Malleolus. The wound measures 10cm length x 8cm width x 0.2cm depth; 62.832cm^2 area and 12.566cm^3 volume. There is Fat Layer (Subcutaneous Tissue) Exposed exposed. There is  no tunneling or undermining noted. There is a large amount of purulent drainage noted. The wound margin is flat and intact. There is small (1-33%) pink granulation within the wound bed. There is a large (67-100%) amount of necrotic tissue within the wound bed including Eschar and Adherent Slough. The periwound skin appearance exhibited: Induration, Hemosiderin Staining, Erythema. The periwound skin appearance did not exhibit: Callus, Crepitus, Excoriation, Rash, Scarring, Dry/Scaly, Maceration, Atrophie Blanche, Cyanosis, Ecchymosis, Mottled, Pallor, Rubor. The surrounding wound skin color is noted with erythema which is circumferential. Periwound temperature was noted as No Abnormality. The periwound has tenderness on palpation. 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 ([id number]) Wound #3 Pre-procedure diagnosis of Wound #3 is a Vasculitis located on the Right,Posterior Lower Leg .Severity of Tissue Pre Debridement is: Fat layer exposed. There was a Chemical/Enzymatic/Mechanical debridement performed by STONE III, HOYT E., PA-C. With the following instrument(s): tongue depressor after achieving pain control using Lidocaine 4% Topical Solution. Agent used was [employer]. A time out was conducted at 12:03, prior to the start of the procedure. There was no bleeding. The procedure was tolerated well with a pain level of 0 throughout and a pain level of 0 following the procedure. Patient s Level of Consciousness post procedure was recorded as Awake and Alert. Post Debridement Measurements: 5.5cm length x 1cm width x 0.2cm depth; 0.864cm^3 volume. Character of Wound/Ulcer Post Debridement is improved. Severity of Tissue Post Debridement is: Fat layer exposed. Post procedure Diagnosis  Wound #3: Same as Pre-Procedure Wound #4 Pre-procedure diagnosis of Wound #4 is a Vasculitis located on the Right,Medial Malleolus .Severity of Tissue Pre Debridement is: Fat layer exposed. There was a Chemical/Enzymatic/Mechanical debridement performed by STONE III, HOYT E., PA-C. With the following instrument(s): tongue depressor after achieving pain control using Lidocaine 4% Topical Solution. Agent used was [employer]. A time out was conducted at 12:04, prior to the start of the procedure. There was  no bleeding. The procedure was tolerated well with a pain level of 0 throughout and a pain level of 0 following the procedure. Patient s Level of Consciousness post procedure was recorded as Awake and Alert. Post Debridement Measurements: 10cm length x 8cm width x 0.2cm depth; 12.566cm^3 volume. Character of Wound/Ulcer Post Debridement is improved. Severity of Tissue Post Debridement is: Fat layer exposed. Post procedure Diagnosis Wound #4: Same as Pre-Procedure Plan Wound Cleansing: Wound #3 Right,Posterior Lower Leg: Clean wound with Normal Saline. Cleanse wound with mild soap and water Wound #4 Right,Medial Malleolus: Clean wound with Normal Saline. Cleanse wound with mild soap and water Anesthetic (add to Medication List): Wound #3 Right,Posterior Lower Leg: Topical Lidocaine 4% cream applied to wound bed prior to debridement (In Clinic Only). Wound #4 Right,Medial Malleolus: Topical Lidocaine 4% cream applied to wound bed prior to debridement (In Clinic Only). Primary Wound Dressing: Wound #3 Right,Posterior Lower Leg: Santyl Ointment Other: - if the santyl is too expensive please use the hydrafera blue foam dressing instead Wound #4 Right,Medial Malleolus: Santyl Ointment Other: - if the santyl is too expensive please use the hydrafera blue foam dressing instead Secondary Dressing: Wound #3 Right,Posterior Lower Leg: Gauze, ABD and Kerlix/Conform - conform, gauze moistened with  saline Wound #4 Right,Medial Malleolus: Gauze, ABD and Kerlix/Conform - conform, gauze moistened with saline Dressing Change Frequency: Wound #3 Right,Posterior Lower Leg: Change dressing every day. Wound #4 Right,Medial Malleolus: [name], [name] ([id number]) Change dressing every day. Follow-up Appointments: Wound #3 Right,Posterior Lower Leg: Return Appointment in 1 week. Wound #4 Right,Medial Malleolus: Return Appointment in 1 week. Edema Control: Wound #3 Right,Posterior Lower Leg: Elevate legs to the level of the heart and pump ankles as often as possible Other: - ace wrap Wound #4 Right,Medial Malleolus: Elevate legs to the level of the heart and pump ankles as often as possible Other: - ace wrap Additional Orders / Instructions: Wound #3 Right,Posterior Lower Leg: Increase protein intake. Wound #4 Right,Medial Malleolus: Increase protein intake. The following medication(s) was prescribed: Santyl topical 250 unit/gram ointment ointment topical applied nickel thick to the wound bed and then cover with a dressing as directed starting 06/16/2018 At this point I did go ahead and send in a prescription for the Santyl for the patient she is in agreement with giving this a try hopefully will not be too expensive for her. Subsequently we will see were things stand at follow-up when we see her back for reevaluation next week. If not doing significantly better or if the medication is too expensive we may have to reassess our treatment goals. If she's not able to get the sandal she will use the [facility] Dressing was previously utilizing until she sees me back next week. Patients in agreement with plan. I am going to have her pre-medicate with her pain medicine next week before she comes for evaluation at which point I hope to be able to perform sharp debridement order to clean away the slough from the surface of the wound and help things to continue to progress  appropriately. Please see above for specific wound care orders. We will see patient for re-evaluation in 1 week(s) here in the clinic. If anything worsens or changes patient will contact our office for additional recommendations. Electronic Signature(s) Signed: 06/16/2018 1:59:57 PM By: Lenda Kelp PA-C Entered By: Lenda Kelp on 06/16/2018 13:56:31 [name], [name] ([id number]) -------------------------------------------------------------------------------- ROS/PFSH Details Patient Name: Marcia [name] Date of Service: 06/16/2018 11:15 AM Medical Record  Number: [id number] Patient Account Number: [id number] Date of Birth/Sex: 15-Jul-1946 (72 y.o. F) Treating RN: Curtis Sites Primary Care Provider: Sherrie Mustache Other Clinician: Referring Provider: DASHER, DAVID Treating Provider/Extender: Linwood Dibbles, HOYT Weeks in Treatment: 5 Information Obtained From Patient Wound History Do you currently have one or more open woundso Yes How many open wounds do you currently haveo 5 Approximately how long have you had your woundso 6 How have you been treating your wound(s) until nowo abx Has your wound(s) ever healed and then re-openedo No Have you had any lab work done in the past montho Yes Who ordered the lab work doneo hospital Have you tested positive for an antibiotic resistant organism (MRSA, VRE)o No Have you tested positive for osteomyelitis (bone infection)o No Have you had any tests for circulation on your legso Yes Constitutional Symptoms (General Health) Complaints and Symptoms: Negative for: Fever; Chills Medical History: Past Medical History Notes: vitamin d deficiency Cardiovascular Complaints and Symptoms: Positive for: LE edema Medical History: Positive for: Hypertension Negative for: Angina; Arrhythmia; Congestive Heart Failure; Coronary Artery Disease; Deep Vein Thrombosis; Hypotension; Myocardial Infarction; Peripheral Arterial Disease; Peripheral Venous Disease;  Phlebitis; Vasculitis Past Medical History Notes: dyspnea 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, [place] Medical History: Negative for: Cataracts; Glaucoma; Optic Neuritis Ear/Nose/Mouth/Throat Medical History: Negative for: Chronic sinus problems/congestion; Middle ear problems Hematologic/Lymphatic [name], [name] ([id number]) Medical History: Negative for: Anemia; Hemophilia; Human Immunodeficiency Virus; Lymphedema; Sickle Cell Disease Respiratory Complaints and Symptoms: No Complaints or Symptoms Medical History: Negative for: Aspiration; Asthma; Chronic Obstructive Pulmonary Disease (COPD); Pneumothorax; Sleep Apnea; Tuberculosis Past Medical History Notes: nodule of lung Gastrointestinal Medical History: Negative for: Cirrhosis ; Colitis; Crohnos; Hepatitis A; Hepatitis B; Hepatitis C Endocrine Medical History: Positive for: Type II Diabetes Time with diabetes: 10 years Treated with: Oral agents Blood sugar tested every day: Yes Tested : every mornimg Genitourinary Medical History: Negative for: End Stage Renal Disease Immunological Medical History: Negative for: Lupus Erythematosus; Raynaudos; Scleroderma Integumentary (Skin) Medical History: Negative for: History of Burn; History of pressure wounds Musculoskeletal Medical History: Negative for: Gout; Rheumatoid Arthritis; Osteoarthritis; Osteomyelitis Past Medical History Notes: arthritis Neurologic Medical History: Positive for: Neuropathy - peripheral Negative for: Dementia; Quadriplegia; Paraplegia; Seizure Disorder Oncologic [name], [name] ([id number]) Medical History: Negative for: Received Chemotherapy; Received Radiation Psychiatric Complaints and Symptoms: No Complaints or Symptoms Medical History: Negative for: Anorexia/bulimia; Confinement Anxiety Immunizations Pneumococcal Vaccine: Received Pneumococcal Vaccination: Yes Tetanus Vaccine: Last tetanus shot: 05/12/2014 Immunization  Notes: plan on flu shot Implantable Devices Family and Social History Cancer: Yes - Siblings; Diabetes: Yes - Mother,Father,Siblings; Heart Disease: Yes - Father; Hypertension: Yes - Father; Kidney Disease: No; Lung Disease: No; Seizures: No; Stroke: Yes - Father,Mother; Thyroid Problems: No; Tuberculosis: No; Former smoker - 10-15 years quit; Marital Status - Married; Alcohol Use: Never; Drug Use: No History; Caffeine Use: Daily; Financial Concerns: No; Food, Clothing or Shelter Needs: No; Support System Lacking: No; Transportation Concerns: No; Advanced Directives: No; Patient does not want information on Advanced Directives; Living Will: No; Medical Power of Attorney: No Physician Affirmation I have reviewed and agree with the above information. Electronic Signature(s) Signed: 06/16/2018 1:59:57 PM By: Lenda Kelp PA-C Signed: 06/16/2018 4:54:17 PM By: Curtis Sites Entered By: Lenda Kelp on 06/16/2018 13:53:59 [place], [name] ([id number]) -------------------------------------------------------------------------------- SuperBill Details Patient Name: Marcia [name] Date of Service: 06/16/2018 Medical Record Number: [id number] Patient Account Number: [id number] Date of Birth/Sex: January 29, 1946 (72 y.o. F) Treating RN: Curtis Sites Primary Care  Provider: Sherrie Mustache Other Clinician: Referring Provider: DASHER, DAVID Treating Provider/Extender: Linwood Dibbles, HOYT Weeks in Treatment: 5 Diagnosis Coding ICD-10 Codes Code Description 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 L97.212 Non-pressure chronic ulcer of right calf with fat layer exposed L97.312 Non-pressure chronic ulcer of right ankle with fat layer exposed E11.622 Type 2 diabetes mellitus with other skin ulcer Facility Procedures CPT4 Code: 34621947 Description: 12527 - DEBRIDE W/O ANES NON SELECT Modifier: Quantity: 1 CPT4 Code: 12929090 Description: 30149 - DEBRIDE W/O ANES NON SELECT Modifier: Quantity: 1 Physician  Procedures CPT4 Code: 9692493 Description: 99214 - WC PHYS LEVEL 4 - EST PT ICD-10 Diagnosis Description 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

## 2018-06-23 ENCOUNTER — Encounter: Payer: Medicare HMO | Admitting: Physician Assistant

## 2018-06-23 DIAGNOSIS — E11622 Type 2 diabetes mellitus with other skin ulcer: Secondary | ICD-10-CM | POA: Diagnosis not present

## 2018-06-25 NOTE — Progress Notes (Signed)
Marcia, [name] ([id number]) Visit Report for 06/23/2018 Arrival Information Details Patient Name: [name], [name] Date of Service: 06/23/2018 11:00 AM Medical Record Number: [id number] Patient Account Number: [id number] Date of Birth/Sex: 1946-09-02 (72 y.o. F) Treating RN: Curtis Sites Primary Care Magdelene Ruark: Sherrie Mustache Other Clinician: Referring Zella Dewan: DASHER, DAVID Treating Jewelianna Pancoast/Extender: Linwood Dibbles, HOYT Weeks in Treatment: 6 Visit Information History Since Last Visit Added or deleted any medications: No Patient Arrived: Ambulatory Any new allergies or adverse reactions: No Arrival Time: 11:08 Had a fall or experienced change in No Accompanied By: self activities of daily living that may affect Transfer Assistance: None risk of falls: Patient Identification Verified: Yes Signs or symptoms of abuse/neglect since last visito No Secondary Verification Process Completed: Yes Hospitalized since last visit: No Patient Requires Transmission-Based No Implantable device outside of the clinic excluding No Precautions: cellular tissue based products placed in the center Patient Has Alerts: Yes since last visit: Patient Alerts: DMII Has Dressing in Place as Prescribed: Yes Pain Present Now: No Electronic Signature(s) Signed: 06/23/2018 2:31:41 PM By: Dayton Martes RCP, RRT, CHT Entered By: Weyman Rodney, Lucio Edward on 06/23/2018 11:09:03 Erbes, Ahjanae AMarland Kitchen ([id number]) -------------------------------------------------------------------------------- Lower Extremity Assessment Details Patient Name: Marcia [name] Date of Service: 06/23/2018 11:00 AM Medical Record Number: [id number] Patient Account Number: [id number] Date of Birth/Sex: 10/24/1945 (72 y.o. F) Treating RN: Rema Jasmine Primary Care Yana Schorr: Sherrie Mustache Other Clinician: Referring Purl Claytor: DASHER, DAVID Treating Sayvon Arterberry/Extender: Linwood Dibbles, HOYT Weeks in Treatment: 6 Edema Assessment Assessed:  [Left: No] [Right: No] Edema: [Left: N] [Right: o] Calf Left: Right: Point of Measurement: 34 cm From Medial Instep cm 41 cm Ankle Left: Right: Point of Measurement: 12 cm From Medial Instep cm 21 cm Vascular Assessment Claudication: Claudication Assessment [Right:None] Pulses: Dorsalis Pedis Palpable: [Right:Yes] Posterior Tibial Extremity colors, hair growth, and conditions: Extremity Color: [Right:Red] Hair Growth on Extremity: [Right:No] Temperature of Extremity: [Right:Warm] Capillary Refill: [Right:< 3 seconds] Toe Nail Assessment Left: Right: Thick: Yes Discolored: Yes Deformed: Yes Improper Length and Hygiene: No Electronic Signature(s) Signed: 06/23/2018 11:54:58 AM By: Rema Jasmine Entered By: Rema Jasmine on 06/23/2018 11:25:55 Power, Euphemia AMarland Kitchen ([id number]) -------------------------------------------------------------------------------- Multi Wound Chart Details Patient Name: Marcia [name] Date of Service: 06/23/2018 11:00 AM Medical Record Number: [id number] Patient Account Number: [id number] Date of Birth/Sex: 04/02/1946 (72 y.o. F) Treating RN: Curtis Sites Primary Care Nautia Lem: Sherrie Mustache Other Clinician: Referring Julien Berryman: DASHER, DAVID Treating Junnie Loschiavo/Extender: Linwood Dibbles, HOYT Weeks in Treatment: 6 Vital Signs Height(in): 70 Pulse(bpm): 75 Weight(lbs): 193 Blood Pressure(mmHg): 149/53 Body Mass Index(BMI): 28 Temperature(F): 97.9 Respiratory Rate 18 (breaths/min): Photos: [N/A:N/A] Wound Location: Right, Posterior Lower Leg Right Malleolus - Medial N/A Wounding Event: Gradually Appeared Gradually Appeared N/A Primary Etiology: Vasculitis Vasculitis N/A Secondary Etiology: Diabetic Wound/Ulcer of the Diabetic Wound/Ulcer of the N/A Lower Extremity Lower Extremity Comorbid History: Hypertension, Type II Hypertension, Type II N/A Diabetes, Neuropathy Diabetes, Neuropathy Date Acquired: 03/12/2018 03/12/2018 N/A Weeks of Treatment: 6 6 N/A Wound  Status: Open Open N/A Clustered Wound: No Yes N/A Clustered Quantity: N/A 5 N/A Measurements L x W x D 1.9x1.5x0.2 9.6x4.5x0.2 N/A (cm) Area (cm) : 2.238 33.929 N/A Volume (cm) : 0.448 6.786 N/A % Reduction in Area: -111.10% -87.00% N/A % Reduction in Volume: -322.60% -87.00% N/A Classification: Full Thickness Without Full Thickness Without N/A Exposed Support Structures Exposed Support Structures Exudate Amount: Small Large N/A Exudate Type: Serous Purulent N/A Exudate Color: amber yellow, brown, green N/A Wound Margin: Flat and Intact Flat and Intact N/A  Granulation Amount: Medium (34-66%) Small (1-33%) N/A Granulation Quality: Red, Pink Pink N/A Necrotic Amount: Medium (34-66%) Large (67-100%) N/A Necrotic Tissue: Eschar, Adherent Slough Eschar, Adherent Slough N/A Exposed Structures: N/A CHASELYN, [name] ([id number]) Fat Layer (Subcutaneous Fat Layer (Subcutaneous Tissue) Exposed: Yes Tissue) Exposed: Yes Fascia: No Fascia: No Tendon: No Tendon: No Muscle: No Muscle: No Joint: No Joint: No Bone: No Bone: No Epithelialization: Small (1-33%) Small (1-33%) N/A Periwound Skin Texture: Excoriation: No Induration: Yes N/A Induration: No Excoriation: No Callus: No Callus: No Crepitus: No Crepitus: No Rash: No Rash: No Scarring: No Scarring: No Periwound Skin Moisture: Maceration: Yes Maceration: No N/A Dry/Scaly: No Dry/Scaly: No Periwound Skin Color: Erythema: Yes Erythema: Yes N/A Hemosiderin Staining: Yes Hemosiderin Staining: Yes Atrophie Blanche: No Atrophie Blanche: No Cyanosis: No Cyanosis: No Ecchymosis: No Ecchymosis: No Mottled: No Mottled: No Pallor: No Pallor: No Rubor: No Rubor: No Erythema Location: Circumferential Circumferential N/A Temperature: No Abnormality No Abnormality N/A Tenderness on Palpation: Yes Yes N/A Wound Preparation: Ulcer Cleansing: Ulcer Cleansing: N/A Rinsed/Irrigated with Saline, Rinsed/Irrigated with  Saline, Other: soap and water Other: soap and water Topical Anesthetic Applied: Topical Anesthetic Applied: Other: lidocaine 4% Other: lidocaine 4% Treatment Notes Electronic Signature(s) Signed: 06/23/2018 5:04:25 PM By: Curtis Sites Entered By: Curtis Sites on 06/23/2018 11:57:47 [name], [name] ([id number]) -------------------------------------------------------------------------------- Multi-Disciplinary Care Plan Details Patient Name: Marcia [name] Date of Service: 06/23/2018 11:00 AM Medical Record Number: [id number] Patient Account Number: [id number] Date of Birth/Sex: May 03, 1946 (72 y.o. F) Treating RN: Curtis Sites Primary Care Kamrynn Melott: Sherrie Mustache Other Clinician: Referring Aarsh Fristoe: DASHER, DAVID Treating Jessamy Torosyan/Extender: Linwood Dibbles, HOYT Weeks in Treatment: 6 Active Inactive ` Abuse / Safety / Falls / Self Care Management Nursing Diagnoses: Potential for falls Goals: Patient will not experience any injury related to falls Date Initiated: 05/12/2018 Target Resolution Date: 08/15/2018 Goal Status: Active Interventions: Assess Activities of Daily Living upon admission and as needed Assess fall risk on admission and as needed Assess: immobility, friction, shearing, incontinence upon admission and as needed Assess impairment of mobility on admission and as needed per policy Assess personal safety and home safety (as indicated) on admission and as needed Notes: ` Nutrition Nursing Diagnoses: Imbalanced nutrition Impaired glucose control: actual or potential Goals: Patient/caregiver agrees to and verbalizes understanding of need to use nutritional supplements and/or vitamins as prescribed Date Initiated: 05/12/2018 Target Resolution Date: 09/12/2018 Goal Status: Active Patient/caregiver will maintain therapeutic glucose control Date Initiated: 05/12/2018 Target Resolution Date: 08/15/2018 Goal Status: Active Interventions: Assess patient nutrition upon  admission and as needed per policy Provide education on elevated blood sugars and impact on wound healing Provide education on nutrition Notes: KENNIDI, YOSHIDA ([id number]) Orientation to the Wound Care Program Nursing Diagnoses: Knowledge deficit related to the wound healing center program Goals: Patient/caregiver will verbalize understanding of the Wound Healing Center Program Date Initiated: 05/12/2018 Target Resolution Date: 06/13/2018 Goal Status: Active Interventions: Provide education on orientation to the wound center Notes: ` Wound/Skin Impairment Nursing Diagnoses: Impaired tissue integrity Knowledge deficit related to ulceration/compromised skin integrity Goals: Ulcer/skin breakdown will have a volume reduction of 80% by week 12 Date Initiated: 05/12/2018 Target Resolution Date: 09/05/2018 Goal Status: Active Interventions: Assess patient/caregiver ability to perform ulcer/skin care regimen upon admission and as needed Assess ulceration(s) every visit Notes: Electronic Signature(s) Signed: 06/23/2018 5:04:25 PM By: Curtis Sites Entered By: Curtis Sites on 06/23/2018 11:57:36 Radler, [name] ([id number]) -------------------------------------------------------------------------------- Pain Assessment Details Patient Name: Marcia [name] Date of Service: 06/23/2018 11:00  AM Medical Record Number: [id number] Patient Account Number: [id number] Date of Birth/Sex: 24-Dec-1945 (72 y.o. F) Treating RN: Curtis Sites Primary Care Carolie Mcilrath: Sherrie Mustache Other Clinician: Referring Ambry Dix: DASHER, DAVID Treating Briasia Flinders/Extender: Linwood Dibbles, HOYT Weeks in Treatment: 6 Active Problems Location of Pain Severity and Description of Pain Patient Has Paino No Site Locations Pain Management and Medication Current Pain Management: Electronic Signature(s) Signed: 06/23/2018 2:31:41 PM By: Dayton Martes RCP, RRT, CHT Signed: 06/23/2018 5:04:25 PM By: Curtis Sites Entered By: Dayton Martes on 06/23/2018 11:09:12 Schraeder, Laylanie A. ([id number]) -------------------------------------------------------------------------------- Wound Assessment Details Patient Name: Marcia [name] Date of Service: 06/23/2018 11:00 AM Medical Record Number: [id number] Patient Account Number: [id number] Date of Birth/Sex: [birth year] (72 y.o. F) Treating RN: Rema Jasmine Primary Care Rc Amison: Sherrie Mustache Other Clinician: Referring Isaiha Asare: DASHER, DAVID Treating Nandika Stetzer/Extender: Linwood Dibbles, HOYT Weeks in Treatment: 6 Wound Status Wound Number: 3 Primary Etiology: Vasculitis Wound Location: Right, Posterior Lower Leg Secondary Diabetic Wound/Ulcer of the Lower Etiology: Extremity Wounding Event: Gradually Appeared Wound Status: Open Date Acquired: 03/12/2018 Comorbid History: Hypertension, Type II Diabetes, Weeks Of Treatment: 6 Neuropathy Clustered Wound: No Photos Photo Uploaded By: Rema Jasmine on 06/23/2018 11:53:26 Wound Measurements Length: (cm) 1.9 Width: (cm) 1.5 Depth: (cm) 0.2 Area: (cm) 2.238 Volume: (cm) 0.448 % Reduction in Area: -111.1% % Reduction in Volume: -322.6% Epithelialization: Small (1-33%) Tunneling: No Undermining: No Wound Description Full Thickness Without Exposed Support Classification: Structures Wound Margin: Flat and Intact Exudate Small Amount: Exudate Type: Serous Exudate Color: amber Foul Odor After Cleansing: No Slough/Fibrino Yes Wound Bed Granulation Amount: Medium (34-66%) Exposed Structure Granulation Quality: Red, Pink Fascia Exposed: No Necrotic Amount: Medium (34-66%) Fat Layer (Subcutaneous Tissue) Exposed: Yes Necrotic Quality: Eschar, Adherent Slough Tendon Exposed: No Muscle Exposed: No Joint Exposed: No Bone Exposed: No Larranaga, Panagiota A. ([id number]) Periwound Skin Texture Texture Color No Abnormalities Noted: No No Abnormalities Noted: No Callus: No Atrophie Blanche:  No Crepitus: No Cyanosis: No Excoriation: No Ecchymosis: No Induration: No Erythema: Yes Rash: No Erythema Location: Circumferential Scarring: No Hemosiderin Staining: Yes Mottled: No Moisture Pallor: No No Abnormalities Noted: No Rubor: No Dry / Scaly: No Maceration: Yes Temperature / Pain Temperature: No Abnormality Tenderness on Palpation: Yes Wound Preparation Ulcer Cleansing: Rinsed/Irrigated with Saline, Other: soap and water, Topical Anesthetic Applied: Other: lidocaine 4%, Electronic Signature(s) Signed: 06/23/2018 11:54:58 AM By: Rema Jasmine Entered By: Rema Jasmine on 06/23/2018 11:22:01 Nakamura, Rozena AMarland Kitchen ([id number]) -------------------------------------------------------------------------------- Wound Assessment Details Patient Name: Marcia [name] Date of Service: 06/23/2018 11:00 AM Medical Record Number: [id number] Patient Account Number: [id number] Date of Birth/Sex: 1946-08-17 (72 y.o. F) Treating RN: Rema Jasmine Primary Care Rebakah Cokley: Sherrie Mustache Other Clinician: Referring Arville Postlewaite: DASHER, DAVID Treating Brevin Mcfadden/Extender: Linwood Dibbles, HOYT Weeks in Treatment: 6 Wound Status Wound Number: 4 Primary Etiology: Vasculitis Wound Location: Right Malleolus - Medial Secondary Diabetic Wound/Ulcer of the Lower Etiology: Extremity Wounding Event: Gradually Appeared Wound Status: Open Date Acquired: 03/12/2018 Comorbid History: Hypertension, Type II Diabetes, Weeks Of Treatment: 6 Neuropathy Clustered Wound: Yes Photos Photo Uploaded By: Rema Jasmine on 06/23/2018 11:53:26 Wound Measurements Length: (cm) 9.6 Width: (cm) 4.5 Depth: (cm) 0.2 Clustered Quantity: 5 Area: (cm) 33.929 Volume: (cm) 6.786 % Reduction in Area: -87% % Reduction in Volume: -87% Epithelialization: Small (1-33%) Tunneling: No Undermining: No Wound Description Full Thickness Without Exposed Support Foul O Classification: Structures Slough Wound Margin: Flat and  Intact Exudate Large Amount: Exudate Type: Purulent Exudate Color: yellow, brown, green dor After Cleansing: No Lelon Perla  Yes Wound Bed Granulation Amount: Small (1-33%) Exposed Structure Granulation Quality: Pink Fascia Exposed: No Necrotic Amount: Large (67-100%) Fat Layer (Subcutaneous Tissue) Exposed: Yes Necrotic Quality: Eschar, Adherent Slough Tendon Exposed: No Muscle Exposed: No Joint Exposed: No Warne, Heddy A. ([id number]) Bone Exposed: No Periwound Skin Texture Texture Color No Abnormalities Noted: No No Abnormalities Noted: No Callus: No Atrophie Blanche: No Crepitus: No Cyanosis: No Excoriation: No Ecchymosis: No Induration: Yes Erythema: Yes Rash: No Erythema Location: Circumferential Scarring: No Hemosiderin Staining: Yes Mottled: No Moisture Pallor: No No Abnormalities Noted: No Rubor: No Dry / Scaly: No Maceration: No Temperature / Pain Temperature: No Abnormality Tenderness on Palpation: Yes Wound Preparation Ulcer Cleansing: Rinsed/Irrigated with Saline, Other: soap and water, Topical Anesthetic Applied: Other: lidocaine 4%, Electronic Signature(s) Signed: 06/23/2018 11:54:58 AM By: Rema Jasmine Entered By: Rema Jasmine on 06/23/2018 11:23:38 Speedy, [name] ([id number]) -------------------------------------------------------------------------------- Vitals Details Patient Name: Marcia [name] Date of Service: 06/23/2018 11:00 AM Medical Record Number: [id number] Patient Account Number: [id number] Date of Birth/Sex: 1945-10-30 (72 y.o. F) Treating RN: Curtis Sites Primary Care Abdoul Encinas: Sherrie Mustache Other Clinician: Referring Kaydin Labo: DASHER, DAVID Treating Myles Tavella/Extender: Linwood Dibbles, HOYT Weeks in Treatment: 6 Vital Signs Time Taken: 11:10 Temperature (F): 97.9 Height (in): 70 Pulse (bpm): 75 Weight (lbs): 193 Respiratory Rate (breaths/min): 18 Body Mass Index (BMI): 27.7 Blood Pressure (mmHg): 149/53 Reference Range: 80 - 120  mg / dl Electronic Signature(s) Signed: 06/23/2018 2:31:41 PM By: Dayton Martes RCP, RRT, CHT Entered By: Dayton Martes on 06/23/2018 11:11:17

## 2018-06-25 NOTE — Progress Notes (Signed)
CINDERELLA, [name] ([id number]) Visit Report for 06/23/2018 Chief Complaint Document Details Patient Name: [name], [name] Date of Service: 06/23/2018 11:00 AM Medical Record Number: [id number] Patient Account Number: [id number] Date of Birth/Sex: June 24, 1946 (72 y.o. F) Treating RN: Curtis Sites Primary Care Provider: Sherrie Mustache Other Clinician: Referring Provider: DASHER, DAVID Treating Provider/Extender: Linwood Dibbles, Euleta Belson Weeks in Treatment: 6 Information Obtained from: Patient Chief Complaint RLE wounds Electronic Signature(s) Signed: 06/23/2018 4:58:18 PM By: Lenda Kelp PA-C Entered By: Lenda Kelp on 06/23/2018 11:19:32 Bockrath, [name] ([id number]) -------------------------------------------------------------------------------- Debridement Details Patient Name: Marcia [name] Date of Service: 06/23/2018 11:00 AM Medical Record Number: [id number] Patient Account Number: [id number] Date of Birth/Sex: 1946/05/25 (72 y.o. F) Treating RN: Curtis Sites Primary Care Provider: Sherrie Mustache Other Clinician: Referring Provider: DASHER, DAVID Treating Provider/Extender: Linwood Dibbles, Aeralyn Barna Weeks in Treatment: 6 Debridement Performed for Wound #3 Right,Posterior Lower Leg Assessment: Performed By: Physician STONE III, Korayma Hagwood E., PA-C Debridement Type: Debridement Severity of Tissue Pre Fat layer exposed Debridement: Level of Consciousness (Pre- Awake and Alert procedure): Pre-procedure Verification/Time Yes - 11:59 Out Taken: Start Time: 11:59 Pain Control: Lidocaine 4% Topical Solution Total Area Debrided (L x W): 1.9 (cm) x 1.5 (cm) = 2.85 (cm) Tissue and other material Slough, Subcutaneous, Slough debrided: Level: Skin/Subcutaneous Tissue Debridement Description: Excisional Instrument: Curette Bleeding: Minimum Hemostasis Achieved: Pressure End Time: 12:01 Procedural Pain: 0 Post Procedural Pain: 0 Response to Treatment: Procedure was tolerated well Level of  Consciousness Awake and Alert (Post-procedure): Post Debridement Measurements of Total Wound Length: (cm) 1.9 Width: (cm) 1.5 Depth: (cm) 0.3 Volume: (cm) 0.672 Character of Wound/Ulcer Post Debridement: Improved Severity of Tissue Post Debridement: Fat layer exposed Post Procedure Diagnosis Same as Pre-procedure Electronic Signature(s) Signed: 06/23/2018 4:58:18 PM By: Lenda Kelp PA-C Signed: 06/23/2018 5:04:25 PM By: Curtis Sites Entered By: Curtis Sites on 06/23/2018 12:00:36 Rollo, [name] ([id number]) -------------------------------------------------------------------------------- Debridement Details Patient Name: Marcia [name] Date of Service: 06/23/2018 11:00 AM Medical Record Number: [id number] Patient Account Number: [id number] Date of Birth/Sex: [birth year] (72 y.o. F) Treating RN: Curtis Sites Primary Care Provider: Sherrie Mustache Other Clinician: Referring Provider: DASHER, DAVID Treating Provider/Extender: Linwood Dibbles, Neila Teem Weeks in Treatment: 6 Debridement Performed for Wound #4 Right,Medial Malleolus Assessment: Performed By: Physician STONE III, Alicyn Klann E., PA-C Debridement Type: Debridement Severity of Tissue Pre Fat layer exposed Debridement: Level of Consciousness (Pre- Awake and Alert procedure): Pre-procedure Verification/Time Yes - 12:01 Out Taken: Start Time: 12:01 Pain Control: Lidocaine 4% Topical Solution Total Area Debrided (L x W): 4 (cm) x 1.5 (cm) = 6 (cm) Tissue and other material Viable, Non-Viable, Slough, Subcutaneous, Slough debrided: Level: Skin/Subcutaneous Tissue Debridement Description: Excisional Instrument: Curette Bleeding: Minimum Hemostasis Achieved: Pressure End Time: 12:05 Procedural Pain: 0 Post Procedural Pain: 0 Response to Treatment: Procedure was tolerated well Level of Consciousness Awake and Alert (Post-procedure): Post Debridement Measurements of Total Wound Length: (cm) 9.6 Width: (cm)  4.5 Depth: (cm) 0.3 Volume: (cm) 10.179 Character of Wound/Ulcer Post Debridement: Improved Severity of Tissue Post Debridement: Fat layer exposed Post Procedure Diagnosis Same as Pre-procedure Electronic Signature(s) Signed: 06/23/2018 4:58:18 PM By: Lenda Kelp PA-C Signed: 06/23/2018 5:04:25 PM By: Curtis Sites Entered By: Curtis Sites on 06/23/2018 12:05:08 Kloosterman, [name] ([id number]) -------------------------------------------------------------------------------- HPI Details Patient Name: Marcia [name] Date of Service: 06/23/2018 11:00 AM Medical Record Number: [id number] Patient Account Number: [id number] Date of Birth/Sex: 04-Oct-1946 (72 y.o. F) Treating RN: Curtis Sites Primary Care Provider: Sherrie Mustache Other Clinician: Referring Provider:  DASHER, DAVID Treating Provider/Extender: STONE III, Donnah Levert Weeks in Treatment: 6 History of Present Illness HPI Description: 05/12/18-She is seen in initial evaluation for multiple open areas to the right lower extremity. She was recently hospitalized (7/30-8/2) with cellulitis of the right leg, failed outpatient therapy with Keflex. While hospitalized she was treated with IV vancomycin and cefepime; right foot x-ray was negative for osteomyelitis. She admits to intermittent pain, improved since hospitalization. She was discharged on doxycycline which she should complete with the next 48 hours. 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 She has a remote history of following with vascular medicine for venous reflux, she does not tolerate wearing compression stockings d/t skin sensitivities and "burning" sensation. In office ABI are elevated but she has strongly palpable pulses. We will initiate hydrofera blue and ace wrap compression. She is a diabetic with A1c 6.9 while hospitalized. 05/19/18- She is seen in follow up evaluation for RLE  wounds; the lateral area is healed. She did tolerate ace wrap for compression, but experienced increased xerosis. We will hold off on ace wrap and increase lachydrin to twice daily. She c/o pain to the medial cluster of wounds, there is no apparent infection and will initiate steroid cream. She will follow up next week 05/26/18 on evaluation today patient appears to actually be having some issues currently with pain. She has been utilizing the dressings as previously recommended for the lower extremities. With that being said she does appear to have a little bit of a rash she states that anytime it he's in for anything else touches her skin that she will often develop a rash. She's not even sure that should be able to tolerate a compression wrap due to the fact that again she would be concerned about even the cotton layer being in contact with her skin which overtime will cause a burning sensation. She has seen dermatology concerning this unfortunately they really did not have a good answer for her as to why this is occurring. Fortunately there is no evidence of infection at this time. 05/29/18 on evaluation today patient was actually supposed to be back in the office in order to have a dressings/wrap change is a nurse visit. With that being said she was having significant discomfort with want to touch around the wound and. It green discharge. Subsequently the concern was that she had an infection so we did convert her to a physician visit instead of just a nurse visit. Subsequently upon evaluation she does seem to have purulent discharge along with evidence of cellulitis extending up the leg which is definitely worse than when I saw her earlier in the week. Fortunately there does not appear to be any evidence of systemic infection at this point. No fevers, chills, nausea, or vomiting noted at this time. 06/02/18 on evaluation today the patient appears to be doing better in regard to the overall  appearance in regard to the wound bed. She still does have slough covering the surface of the wounds and due to pain really I do not think sharp debridement is going to be advisable at this point. Nonetheless I do feel like that she is definitely showing signs of improvement compared to her previous evaluation. Her pain level is still rather high she tells me at times 8-9/10. Unfortunately she is having a difficult time getting into pain management she states that the one that we referred her to stated  it would be at least two weeks before they can get her in. 06/12/18 on evaluation today patient actually appears to be doing much better in regard to her lower extremity wounds. In fact there does not appear to be any evidence of infection at this time I do believe the Cipro has been effective her pain is also greatly improved compared to previous. Nonetheless she does have some Slough noted that is gonna require debridement. Patient's MRI was reviewed and revealed that she had no evidence of osteomyelitis, septic arthritis, or absence. She did have blisters noted on the surface of the foot which again visually we see today as well. 06/16/18 on evaluation today patient actually appears to be doing fairly well in regard to her lower extremity ulcerations. With that being said she does have issues at this point with discomfort although she did see the pain management physician this morning he did give her medication to help in this regard. She tells me that after I debrided her wound last week that she actually had significant pain to the point that she ended up having a difficult time even driving to get home. Obviously when she left the clinic she was not in that much pain this somewhat surprises me especially since we put lidocaine on her wounds prior to applying the dressings and the dressing we were using was Prisma which is not notorious for causing discomfort and Trow, Jalen A.  ([id number]) burning. Nonetheless she did obviously experience this and she tells me that she set intermittent spells where this occurred in the week since I last saw her. Nonetheless she has been given the pain medications necessary in order to keep things under control at this point. Obviously this is short term but nonetheless I think will be helpful in getting the wounds to heal so that we can appropriately debrided do what we need to do. 06/23/18 on evaluation today patient actually appears to be doing significantly better in regard to her right lower extremity ulcerations. In fact she has made dramatic improvement pretty much all locations which is great news. Fortunately there's no evidence of infection at this time. Her irritation and inflammation also is better than I do believe that the treatment utilizing the Santyl has been beneficial over the past week. I'm not even sure that we need that any further. Electronic Signature(s) Signed: 06/23/2018 4:58:18 PM By: Lenda Kelp PA-C Entered By: Lenda Kelp on 06/23/2018 13:26:20 Lovan, [name] ([id number]) -------------------------------------------------------------------------------- Physical Exam Details Patient Name: Marcia [name] Date of Service: 06/23/2018 11:00 AM Medical Record Number: [id number] Patient Account Number: [id number] Date of Birth/Sex: 02/08/1946 (72 y.o. F) Treating RN: Curtis Sites Primary Care Provider: Sherrie Mustache Other Clinician: Referring Provider: DASHER, DAVID Treating Provider/Extender: STONE III, Micaela Stith Weeks in Treatment: 6 Constitutional Well-nourished and well-hydrated in no acute distress. Respiratory normal breathing without difficulty. Psychiatric this patient is able to make decisions and demonstrates good insight into disease process. Alert and Oriented x 3. pleasant and cooperative. Notes Post debridement which the patient was able to tolerate in a much better way today with her pain  medication she actually appear to be doing very well in regard to her lower extremity ulcers. Overall I'm very pleased with the progress that has been made. I was able to debride away some of the necrotic tissue from the surface of the wounds and in fact I was really able to pretty much cleanup the surface of all wounds today without any complication and  again she had only minimal discomfort this is a definite improvement since her previous evaluation. I do believe that the pain management referral to Dr. Park Breed has been of great benefit. Electronic Signature(s) Signed: 06/23/2018 4:58:18 PM By: Lenda Kelp PA-C Entered By: Lenda Kelp on 06/23/2018 13:27:19 Resor, [name] ([id number]) -------------------------------------------------------------------------------- Physician Orders Details Patient Name: Marcia [name] Date of Service: 06/23/2018 11:00 AM Medical Record Number: [id number] Patient Account Number: [id number] Date of Birth/Sex: Oct 04, 1946 (72 y.o. 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 o Cleanse wound with mild soap and water Wound #4 Right,Medial Malleolus o Clean wound with Normal Saline. o Cleanse wound with mild soap and water Anesthetic (add to Medication List) Wound #3 Right,Posterior Lower Leg o Topical Lidocaine 4% cream applied to wound bed prior to debridement (In Clinic Only). Wound #4 Right,Medial Malleolus o Topical  Lidocaine 4% cream applied to wound bed prior to debridement (In Clinic Only). Primary Wound Dressing Wound #3 Right,Posterior Lower Leg o Silver Collagen Wound #4 Right,Medial Malleolus o Silver Collagen Secondary Dressing Wound #3 Right,Posterior Lower Leg o Gauze, ABD and Kerlix/Conform - conform Wound #4 Right,Medial Malleolus o Gauze, ABD and Kerlix/Conform - conform Dressing Change Frequency Wound #3 Right,Posterior Lower Leg o Change dressing every other day. Wound #4 Right,Medial Malleolus Bobe, Taiylor A. ([id number]) o Change dressing every other day. Follow-up Appointments Wound #3 Right,Posterior Lower Leg o Return Appointment in 1 week. Wound #4 Right,Medial Malleolus o Return Appointment in 1 week. Edema Control Wound #3 Right,Posterior Lower Leg o Elevate legs to the level of the heart and pump ankles as often as possible o Other: - tubigrip Wound #4 Right,Medial Malleolus o Elevate legs to the level of the heart and pump ankles as often as possible o Other: - tubigrip Additional Orders / Instructions Wound #3 Right,Posterior Lower Leg o Increase protein intake. Wound #4 Right,Medial Malleolus o Increase protein intake. Electronic Signature(s) Signed: 06/23/2018 4:58:18 PM By: Lenda Kelp PA-C Signed: 06/23/2018 5:04:25 PM By: Curtis Sites Entered By: Curtis Sites on 06/23/2018 12:06:31 Saddler, [name] ([id number]) -------------------------------------------------------------------------------- Problem List Details Patient Name: Marcia [name] Date of Service: 06/23/2018 11:00 AM Medical Record Number: [id number] Patient Account Number: [id number] Date of Birth/Sex: 1946/04/15 (72 y.o. 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 05/12/2018 No Yes L97.212 Non-pressure chronic ulcer of right calf with fat layer exposed 05/12/2018 No Yes L97.312 Non-pressure chronic ulcer of right ankle with fat layer 05/12/2018 No Yes exposed E11.622 Type 2 diabetes mellitus with other skin ulcer 05/12/2018 No Yes Inactive Problems Resolved Problems Electronic Signature(s) Signed: 06/23/2018 4:58:18 PM By: Lenda Kelp PA-C Entered By: Lenda Kelp on 06/23/2018 11:19:19 Castronova, Imojean AMarland Kitchen ([id number]) -------------------------------------------------------------------------------- Progress Note Details Patient Name: Marcia [name] Date of Service: 06/23/2018 11:00 AM Medical Record Number: [id number] Patient Account Number: [id number] Date of Birth/Sex: 09-20-1946 (72 y.o.  F) Treating RN: Curtis Sites Primary Care Provider: Sherrie Mustache Other Clinician: Referring Provider: DASHER, DAVID Treating Provider/Extender: Linwood Dibbles, Clements Toro Weeks in Treatment: 6 Subjective Chief Complaint Information obtained from Patient RLE wounds History of Present Illness (HPI) 05/12/18-She is seen in initial evaluation for multiple open areas to the right lower extremity. She was recently hospitalized (7/30-8/2) with cellulitis of the right leg, failed outpatient therapy with Keflex. While hospitalized she was treated with IV vancomycin and cefepime; right foot x-ray was negative for osteomyelitis. She admits to intermittent pain, improved since hospitalization. She was discharged on doxycycline which she should complete with the next 48 hours. 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 She has a remote history of following with vascular medicine for venous reflux, she does not tolerate wearing compression stockings d/t skin sensitivities and "burning" sensation. In office ABI are elevated but she  has strongly palpable pulses. We will initiate hydrofera blue and ace wrap compression. She is a diabetic with A1c 6.9 while hospitalized. 05/19/18- She is seen in follow up evaluation for RLE wounds; the lateral area is healed. She did tolerate ace wrap for compression, but experienced increased xerosis. We will hold off on ace wrap and increase lachydrin to twice daily. She c/o pain to the medial cluster of wounds, there is no apparent infection and will initiate steroid cream. She will follow up next week 05/26/18 on evaluation today patient appears to actually be having some issues currently with pain. She has been utilizing the dressings as previously recommended for the lower extremities. With that being said she does appear to have a little bit of a rash she states that anytime it he's in for anything else touches her skin that she will often develop a rash. She's not even sure that should be able to tolerate a compression wrap due to the fact that again she would be concerned about even the cotton layer being in contact with her skin which overtime will cause a burning sensation. She has seen dermatology concerning this unfortunately they really did not have a good answer for her as to why this is occurring. Fortunately there is no evidence of infection at this time. 05/29/18 on evaluation today patient was actually supposed to be back in the office in order to have a dressings/wrap change is a nurse visit. With that being said she was having significant discomfort with want to touch around the wound and. It green discharge. Subsequently the concern was that she had an infection so we did convert her to a physician visit instead of just a nurse visit. Subsequently upon evaluation she does seem to have purulent discharge along with evidence of cellulitis extending up the leg which is definitely worse than when I saw her earlier in the week. Fortunately there does not appear to be any evidence  of systemic infection at this point. No fevers, chills, nausea, or vomiting noted at this time. 06/02/18 on evaluation today the patient appears to be doing better in regard to the overall appearance in regard to the wound bed. She still does have slough covering the surface of the wounds and due to pain really I do not think sharp debridement is going to be advisable at this point. Nonetheless I do feel like that she is definitely showing signs of improvement compared to her previous evaluation. Her pain level is still rather high she tells me at times 8-9/10.  Unfortunately she is having a difficult time getting into pain management she states that the one that we referred her to stated it would be at least two weeks before they can get her in. 06/12/18 on evaluation today patient actually appears to be doing much better in regard to her lower extremity wounds. In fact there does not appear to be any evidence of infection at this time I do believe the Cipro has been effective her pain is also greatly improved compared to previous. Nonetheless she does have some Slough noted that is gonna require debridement. Patient's MRI was reviewed and revealed that she had no evidence of osteomyelitis, septic arthritis, or absence. She did have blisters noted on the surface of the foot which again visually we see today as well. SHARNITA, BOGUCKI ([id number]) 06/16/18 on evaluation today patient actually appears to be doing fairly well in regard to her lower extremity ulcerations. With that being said she does have issues at this point with discomfort although she did see the pain management physician this morning he did give her medication to help in this regard. She tells me that after I debrided her wound last week that she actually had significant pain to the point that she ended up having a difficult time even driving to get home. Obviously when she left the clinic she was not in that much pain this somewhat  surprises me especially since we put lidocaine on her wounds prior to applying the dressings and the dressing we were using was Prisma which is not notorious for causing discomfort and burning. Nonetheless she did obviously experience this and she tells me that she set intermittent spells where this occurred in the week since I last saw her. Nonetheless she has been given the pain medications necessary in order to keep things under control at this point. Obviously this is short term but nonetheless I think will be helpful in getting the wounds to heal so that we can appropriately debrided do what we need to do. 06/23/18 on evaluation today patient actually appears to be doing significantly better in regard to her right lower extremity ulcerations. In fact she has made dramatic improvement pretty much all locations which is great news. Fortunately there's no evidence of infection at this time. Her irritation and inflammation also is better than I do believe that the treatment utilizing the Santyl has been beneficial over the past week. I'm not even sure that we need that any further. Patient History Information obtained from Patient. Family History Cancer - Siblings, Diabetes - Mother,Father,Siblings, Heart Disease - Father, Hypertension - Father, Stroke - Father,Mother, No family history of Kidney Disease, Lung Disease, Seizures, Thyroid Problems, Tuberculosis. Social History Former smoker - 10-15 years quit, Marital Status - Married, Alcohol Use - Never, Drug Use - No History, Caffeine Use - Daily. Medical And Surgical History Notes Constitutional Symptoms (General Health) vitamin d deficiency Respiratory nodule of lung Cardiovascular dyspnea 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, AVV Musculoskeletal arthritis Review of Systems (ROS) Constitutional Symptoms (General Health) Denies complaints or symptoms of Fatigue, Fever, Chills. Respiratory The patient has no complaints or  symptoms. Cardiovascular Complains or has symptoms of LE edema. Psychiatric The patient has no complaints or symptoms. Objective Constitutional Well-nourished and well-hydrated in no acute distress. SHANELL, ADEN AMarland Kitchen ([id number]) Vitals Time Taken: 11:10 AM, Height: 70 in, Weight: 193 lbs, BMI: 27.7, Temperature: 97.9 F, Pulse: 75 bpm, Respiratory Rate: 18 breaths/min, Blood Pressure: 149/53 mmHg. Respiratory normal breathing without difficulty. Psychiatric  this patient is able to make decisions and demonstrates good insight into disease process. Alert and Oriented x 3. pleasant and cooperative. General Notes: Post debridement which the patient was able to tolerate in a much better way today with her pain medication she actually appear to be doing very well in regard to her lower extremity ulcers. Overall I'm very pleased with the progress that has been made. I was able to debride away some of the necrotic tissue from the surface of the wounds and in fact I was really able to pretty much cleanup the surface of all wounds today without any complication and again she had only minimal discomfort this is a definite improvement since her previous evaluation. I do believe that the pain management referral to Dr. Park Breed has been of great benefit. Integumentary (Hair, Skin) Wound #3 status is Open. Original cause of wound was Gradually Appeared. The wound is located on the Right,Posterior Lower Leg. The wound measures 1.9cm length x 1.5cm width x 0.2cm depth; 2.238cm^2 area and 0.448cm^3 volume. There is Fat Layer (Subcutaneous Tissue) Exposed exposed. There is no tunneling or undermining noted. There is a small amount of serous drainage noted. The wound margin is flat and intact. There is medium (34-66%) red, pink granulation within the wound bed. There is a medium (34-66%) amount of necrotic tissue within the wound bed including Eschar and Adherent Slough. The periwound skin appearance exhibited:  Maceration, Hemosiderin Staining, Erythema. The periwound skin appearance did not exhibit: Callus, Crepitus, Excoriation, Induration, Rash, Scarring, Dry/Scaly, Atrophie Blanche, Cyanosis, Ecchymosis, Mottled, Pallor, Rubor. The surrounding wound skin color is noted with erythema which is circumferential. Periwound temperature was noted as No Abnormality. The periwound has tenderness on palpation. Wound #4 status is Open. Original cause of wound was Gradually Appeared. The wound is located on the Right,Medial Malleolus. The wound measures 9.6cm length x 4.5cm width x 0.2cm depth; 33.929cm^2 area and 6.786cm^3 volume. There is Fat Layer (Subcutaneous Tissue) Exposed exposed. There is no tunneling or undermining noted. There is a large amount of purulent drainage noted. The wound margin is flat and intact. There is small (1-33%) pink granulation within the wound bed. There is a large (67-100%) amount of necrotic tissue within the wound bed including Eschar and Adherent Slough. The periwound skin appearance exhibited: Induration, Hemosiderin Staining, Erythema. The periwound skin appearance did not exhibit: Callus, Crepitus, Excoriation, Rash, Scarring, Dry/Scaly, Maceration, Atrophie Blanche, Cyanosis, Ecchymosis, Mottled, Pallor, Rubor. The surrounding wound skin color is noted with erythema which is circumferential. Periwound temperature was noted as No Abnormality. The periwound has tenderness on palpation. 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 ([id number]) Procedures Wound #3 Pre-procedure diagnosis of Wound #3 is a Vasculitis located on the Right,Posterior Lower Leg .Severity of Tissue Pre Debridement is: Fat layer exposed. There was a Excisional Skin/Subcutaneous Tissue Debridement  with a total area of 2.85 sq cm performed by STONE III, Jordan Pardini E., PA-C. With the following instrument(s): Curette Material removed includes Subcutaneous Tissue and Slough and after achieving pain control using Lidocaine 4% Topical Solution. No specimens were taken. A time out was conducted at 11:59, prior to the start of the procedure. A Minimum amount of bleeding was controlled with Pressure. The procedure was tolerated well with a pain level of 0 throughout and a pain level of 0 following the  procedure. Post Debridement Measurements: 1.9cm length x 1.5cm width x 0.3cm depth; 0.672cm^3 volume. Character of Wound/Ulcer Post Debridement is improved. Severity of Tissue Post Debridement is: Fat layer exposed. Post procedure Diagnosis Wound #3: Same as Pre-Procedure Wound #4 Pre-procedure diagnosis of Wound #4 is a Vasculitis located on the Right,Medial Malleolus .Severity of Tissue Pre Debridement is: Fat layer exposed. There was a Excisional Skin/Subcutaneous Tissue Debridement with a total area of 6 sq cm performed by STONE III, Alona Danford E., PA-C. With the following instrument(s): Curette to remove Viable and Non-Viable tissue/material. Material removed includes Subcutaneous Tissue and Slough and after achieving pain control using Lidocaine 4% Topical Solution. No specimens were taken. A time out was conducted at 12:01, prior to the start of the procedure. A Minimum amount of bleeding was controlled with Pressure. The procedure was tolerated well with a pain level of 0 throughout and a pain level of 0 following the procedure. Post Debridement Measurements: 9.6cm length x 4.5cm width x 0.3cm depth; 10.179cm^3 volume. Character of Wound/Ulcer Post Debridement is improved. Severity of Tissue Post Debridement is: Fat layer exposed. Post procedure Diagnosis Wound #4: Same as Pre-Procedure Plan Wound Cleansing: Wound #3 Right,Posterior Lower Leg: Clean wound with Normal Saline. Cleanse wound with mild  soap and water Wound #4 Right,Medial Malleolus: Clean wound with Normal Saline. Cleanse wound with mild soap and water Anesthetic (add to Medication List): Wound #3 Right,Posterior Lower Leg: Topical Lidocaine 4% cream applied to wound bed prior to debridement (In Clinic Only). Wound #4 Right,Medial Malleolus: Topical Lidocaine 4% cream applied to wound bed prior to debridement (In Clinic Only). Primary Wound Dressing: Wound #3 Right,Posterior Lower Leg: Silver Collagen Wound #4 Right,Medial Malleolus: Silver Collagen Secondary Dressing: Wound #3 Right,Posterior Lower Leg: Gauze, ABD and Kerlix/Conform - conform Wound #4 Right,Medial Malleolus: Gauze, ABD and Kerlix/Conform - conform Dressing Change Frequency: Wound #3 Right,Posterior Lower Leg: Change dressing every other day. NORELLE, RUNNION A. ([id number]) Wound #4 Right,Medial Malleolus: Change dressing every other day. Follow-up Appointments: Wound #3 Right,Posterior Lower Leg: Return Appointment in 1 week. Wound #4 Right,Medial Malleolus: Return Appointment in 1 week. Edema Control: Wound #3 Right,Posterior Lower Leg: Elevate legs to the level of the heart and pump ankles as often as possible Other: - tubigrip Wound #4 Right,Medial Malleolus: Elevate legs to the level of the heart and pump ankles as often as possible Other: - tubigrip Additional Orders / Instructions: Wound #3 Right,Posterior Lower Leg: Increase protein intake. Wound #4 Right,Medial Malleolus: Increase protein intake. I am going to suggest at this point that we actually continue with managing this patient we will initiate the above wound care measures for the next period of time until follow-up. She's in agreement the plan. Please see above for specific wound care orders. We will see patient for re-evaluation in 1 week(s) here in the clinic. If anything worsens or changes patient will contact our office for additional recommendations. Electronic  Signature(s) Signed: 06/23/2018 4:58:18 PM By: Lenda Kelp PA-C Entered By: Lenda Kelp on 06/23/2018 13:27:48 Kauffmann, [name] ([id number]) -------------------------------------------------------------------------------- ROS/PFSH Details Patient Name: Marcia [name] Date of Service: 06/23/2018 11:00 AM Medical Record Number: [id number] Patient Account Number: [id number] Date of Birth/Sex: 08-23-1946 (72 y.o. F) Treating RN: Curtis Sites Primary Care Provider: Sherrie Mustache Other Clinician: Referring Provider: DASHER, DAVID Treating Provider/Extender: Linwood Dibbles, Alice Burnside Weeks in Treatment: 6 Information Obtained From Patient Wound History Do you currently have one or more open woundso Yes How many open wounds do you currently haveo  5 Approximately how long have you had your woundso 6 How have you been treating your wound(s) until nowo abx Has your wound(s) ever healed and then re-openedo No Have you had any lab work done in the past montho Yes Who ordered the lab work doneo hospital Have you tested positive for an antibiotic resistant organism (MRSA, VRE)o No Have you tested positive for osteomyelitis (bone infection)o No Have you had any tests for circulation on your legso Yes Constitutional Symptoms (General Health) Complaints and Symptoms: Negative for: Fatigue; Fever; Chills Medical History: Past Medical History Notes: vitamin d deficiency Cardiovascular Complaints and Symptoms: Positive for: LE edema Medical History: Positive for: Hypertension Negative for: Angina; Arrhythmia; Congestive Heart Failure; Coronary Artery Disease; Deep Vein Thrombosis; Hypotension; Myocardial Infarction; Peripheral Arterial Disease; Peripheral Venous Disease; Phlebitis; Vasculitis Past Medical History Notes: dyspnea 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, [place] Medical History: Negative for: Cataracts; Glaucoma; Optic Neuritis Ear/Nose/Mouth/Throat Medical History: Negative  for: Chronic sinus problems/congestion; Middle ear problems Hematologic/Lymphatic EBONNE, STOUP A. ([id number]) Medical History: Negative for: Anemia; Hemophilia; Human Immunodeficiency Virus; Lymphedema; Sickle Cell Disease Respiratory Complaints and Symptoms: No Complaints or Symptoms Medical History: Negative for: Aspiration; Asthma; Chronic Obstructive Pulmonary Disease (COPD); Pneumothorax; Sleep Apnea; Tuberculosis Past Medical History Notes: nodule of lung Gastrointestinal Medical History: Negative for: Cirrhosis ; Colitis; Crohnos; Hepatitis A; Hepatitis B; Hepatitis C Endocrine Medical History: Positive for: Type II Diabetes Time with diabetes: 10 years Treated with: Oral agents Blood sugar tested every day: Yes Tested : every mornimg Genitourinary Medical History: Negative for: End Stage Renal Disease Immunological Medical History: Negative for: Lupus Erythematosus; Raynaudos; Scleroderma Integumentary (Skin) Medical History: Negative for: History of Burn; History of pressure wounds Musculoskeletal Medical History: Negative for: Gout; Rheumatoid Arthritis; Osteoarthritis; Osteomyelitis Past Medical History Notes: arthritis Neurologic Medical History: Positive for: Neuropathy - peripheral Negative for: Dementia; Quadriplegia; Paraplegia; Seizure Disorder Oncologic KIRISTEN, SNELLING ([id number]) Medical History: Negative for: Received Chemotherapy; Received Radiation Psychiatric Complaints and Symptoms: No Complaints or Symptoms Medical History: Negative for: Anorexia/bulimia; Confinement Anxiety Immunizations Pneumococcal Vaccine: Received Pneumococcal Vaccination: Yes Tetanus Vaccine: Last tetanus shot: 05/12/2014 Immunization Notes: plan on flu shot Implantable Devices Family and Social History Cancer: Yes - Siblings; Diabetes: Yes - Mother,Father,Siblings; Heart Disease: Yes - Father; Hypertension: Yes - Father; Kidney Disease: No; Lung Disease: No;  Seizures: No; Stroke: Yes - Father,Mother; Thyroid Problems: No; Tuberculosis: No; Former smoker - 10-15 years quit; Marital Status - Married; Alcohol Use: Never; Drug Use: No History; Caffeine Use: Daily; Financial Concerns: No; Food, Clothing or Shelter Needs: No; Support System Lacking: No; Transportation Concerns: No; Advanced Directives: No; Patient does not want information on Advanced Directives; Living Will: No; Medical Power of Attorney: No Physician Affirmation I have reviewed and agree with the above information. Electronic Signature(s) Signed: 06/23/2018 4:58:18 PM By: Lenda Kelp PA-C Signed: 06/23/2018 5:04:25 PM By: Curtis Sites Entered By: Lenda Kelp on 06/23/2018 13:26:41 Tagliaferri, [name] ([id number]) -------------------------------------------------------------------------------- SuperBill Details Patient Name: Marcia [name] Date of Service: 06/23/2018 Medical Record Number: [id number] Patient Account Number: [id number] Date of Birth/Sex: 1946-08-26 (72 y.o. F) Treating RN: Curtis Sites Primary Care Provider: Sherrie Mustache Other Clinician: Referring Provider: DASHER, DAVID Treating Provider/Extender: Linwood Dibbles, Coby [place] Weeks in Treatment: 6 Diagnosis Coding ICD-10 Codes Code Description 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 L97.212 Non-pressure chronic ulcer of right calf with fat layer exposed L97.312 Non-pressure chronic ulcer of right ankle with fat layer exposed E11.622 Type 2 diabetes mellitus with other skin ulcer Facility Procedures  CPT4 Code: [id number] Description: 11042 - DEB SUBQ TISSUE 20 SQ CM/< ICD-10 Diagnosis Description L97.212 Non-pressure chronic ulcer of right calf with fat layer exp L97.312 Non-pressure chronic ulcer of right ankle with fat layer ex Modifier: osed posed Quantity: 1 Physician Procedures CPT4 Code: [id number] Description: 11042 - WC PHYS SUBQ TISS 20 SQ CM ICD-10 Diagnosis Description L97.212 Non-pressure chronic ulcer of right calf with  fat layer exp L97.312 Non-pressure chronic ulcer of right ankle with fat layer ex Modifier: osed posed Quantity: 1 Electronic Signature(s) Signed: 06/23/2018 4:58:18 PM By: Lenda Kelp PA-C Entered By: Lenda Kelp on 06/23/2018 13:28:02

## 2018-06-30 ENCOUNTER — Other Ambulatory Visit
Admission: RE | Admit: 2018-06-30 | Discharge: 2018-06-30 | Disposition: A | Discharge status: Home / Self Care | Payer: Medicare HMO | Source: Ambulatory Visit | Attending: Physician Assistant | Admitting: Physician Assistant

## 2018-06-30 ENCOUNTER — Encounter: Payer: Medicare HMO | Admitting: Physician Assistant

## 2018-06-30 DIAGNOSIS — E11622 Type 2 diabetes mellitus with other skin ulcer: Secondary | ICD-10-CM | POA: Diagnosis not present

## 2018-06-30 DIAGNOSIS — L089 Local infection of the skin and subcutaneous tissue, unspecified: Secondary | ICD-10-CM | POA: Diagnosis present

## 2018-07-01 NOTE — Progress Notes (Signed)
[name], [name] ([id number]) Visit Report for 06/30/2018 Arrival Information Details Patient Name: [name], [name] Date of Service: 06/30/2018 3:00 PM Medical Record Number: [id number] Patient Account Number: [id number] Date of Birth/Sex: October 30, 1945 (72 y.o. F) Treating RN: Huel Coventry Primary Care Lakelyn Straus: Sherrie Mustache Other Clinician: Referring Mikalia Fessel: Sherrie Mustache Treating Elio Haden/Extender: Linwood Dibbles, HOYT Weeks in Treatment: 7 Visit Information History Since Last Visit Added or deleted any medications: No Patient Arrived: Ambulatory Any new allergies or adverse reactions: No Arrival Time: 15:01 Had a fall or experienced change in No Accompanied By: self activities of daily living that may affect Transfer Assistance: None risk of falls: Patient Identification Verified: Yes Signs or symptoms of abuse/neglect since last visito No Secondary Verification Process Completed: Yes Hospitalized since last visit: No Patient Requires Transmission-Based No Implantable device outside of the clinic excluding No Precautions: cellular tissue based products placed in the center Patient Has Alerts: Yes since last visit: Patient Alerts: DMII Has Dressing in Place as Prescribed: Yes Pain Present Now: No Electronic Signature(s) Signed: 06/30/2018 4:44:51 PM By: Elliot Gurney, BSN, RN, CWS, Kim RN, BSN Entered By: Elliot Gurney, BSN, RN, CWS, Kim on 06/30/2018 15:01:33 Crear, [name] ([id number]) -------------------------------------------------------------------------------- Clinic Level of Care Assessment Details Patient Name: Marcia [name] Date of Service: 06/30/2018 3:00 PM Medical Record Number: [id number] Patient Account Number: [id number] Date of Birth/Sex: April 24, 1946 (72 y.o. F) Treating RN: Huel Coventry Primary Care Yeva Bissette: Sherrie Mustache Other Clinician: Referring Tomeko Scoville: Sherrie Mustache Treating Ayva Veilleux/Extender: Linwood Dibbles, HOYT Weeks in Treatment: 7 Clinic Level of Care Assessment  Items TOOL 4 Quantity Score []  - Use when only an EandM is performed on FOLLOW-UP visit 0 ASSESSMENTS - Nursing Assessment / Reassessment []  - Reassessment of Co-morbidities (includes updates in patient status) 0 X- 1 5 Reassessment of Adherence to Treatment Plan ASSESSMENTS - Wound and Skin Assessment / Reassessment X - Simple Wound Assessment / Reassessment - one wound 1 5 []  - 0 Complex Wound Assessment / Reassessment - multiple wounds []  - 0 Dermatologic / Skin Assessment (not related to wound area) ASSESSMENTS - Focused Assessment []  - Circumferential Edema Measurements - multi extremities 0 []  - 0 Nutritional Assessment / Counseling / Intervention []  - 0 Lower Extremity Assessment (monofilament, tuning fork, pulses) []  - 0 Peripheral Arterial Disease Assessment (using hand held doppler) ASSESSMENTS - Ostomy and/or Continence Assessment and Care []  - Incontinence Assessment and Management 0 []  - 0 Ostomy Care Assessment and Management (repouching, etc.) PROCESS - Coordination of Care X - Simple Patient / Family Education for ongoing care 1 15 []  - 0 Complex (extensive) Patient / Family Education for ongoing care X- 1 10 Staff obtains Chiropractor, Records, Test Results / Process Orders []  - 0 Staff telephones HHA, Nursing Homes / Clarify orders / etc []  - 0 Routine Transfer to another Facility (non-emergent condition) []  - 0 Routine Hospital Admission (non-emergent condition) []  - 0 New Admissions / Manufacturing engineer / Ordering NPWT, Apligraf, etc. []  - 0 Emergency Hospital Admission (emergent condition) X- 1 10 Simple Discharge Coordination [name], [name] ([id number]) []  - 0 Complex (extensive) Discharge Coordination PROCESS - Special Needs []  - Pediatric / Minor Patient Management 0 []  - 0 Isolation Patient Management []  - 0 Hearing / Language / Visual special needs []  - 0 Assessment of Community assistance (transportation, D/C planning, etc.) []  -  0 Additional assistance / Altered mentation []  - 0 Support Surface(s) Assessment (bed, cushion, seat, etc.) INTERVENTIONS - Wound Cleansing / Measurement X - Simple Wound Cleansing -  one wound 1 5 []  - 0 Complex Wound Cleansing - multiple wounds X- 1 5 Wound Imaging (photographs - any number of wounds) []  - 0 Wound Tracing (instead of photographs) X- 1 5 Simple Wound Measurement - one wound []  - 0 Complex Wound Measurement - multiple wounds INTERVENTIONS - Wound Dressings []  - Small Wound Dressing one or multiple wounds 0 []  - 0 Medium Wound Dressing one or multiple wounds X- 1 20 Large Wound Dressing one or multiple wounds []  - 0 Application of Medications - topical []  - 0 Application of Medications - injection INTERVENTIONS - Miscellaneous []  - External ear exam 0 []  - 0 Specimen Collection (cultures, biopsies, blood, body fluids, etc.) []  - 0 Specimen(s) / Culture(s) sent or taken to Lab for analysis []  - 0 Patient Transfer (multiple staff / Nurse, adult / Similar devices) []  - 0 Simple Staple / Suture removal (25 or less) []  - 0 Complex Staple / Suture removal (26 or more) []  - 0 Hypo / Hyperglycemic Management (close monitor of Blood Glucose) []  - 0 Ankle / Brachial Index (ABI) - do not check if billed separately X- 1 5 Vital Signs [name], [name] ([id number]) Has the patient been seen at the hospital within the last three years: Yes Total Score: 85 Level Of Care: New/Established - Level 3 Electronic Signature(s) Signed: 06/30/2018 4:44:51 PM By: Elliot Gurney, BSN, RN, CWS, Kim RN, BSN Entered By: Elliot Gurney, BSN, RN, CWS, Kim on 06/30/2018 15:30:53 [name], [name] ([id number]) -------------------------------------------------------------------------------- Encounter Discharge Information Details Patient Name: Marcia [name] Date of Service: 06/30/2018 3:00 PM Medical Record Number: [id number] Patient Account Number: [id number] Date of Birth/Sex: [birth year] (72 y.o.  F) Treating RN: Huel Coventry Primary Care Maxum Cassarino: Sherrie Mustache Other Clinician: Referring Idus Rathke: Sherrie Mustache Treating Clotiel Troop/Extender: Skeet Simmer in Treatment: 7 Encounter Discharge Information Items Discharge Condition: Stable Ambulatory Status: Ambulatory Discharge Destination: Home Transportation: Private Auto Accompanied By: self Schedule Follow-up Appointment: Yes Clinical Summary of Care: Electronic Signature(s) Signed: 06/30/2018 4:44:51 PM By: Elliot Gurney, BSN, RN, CWS, Kim RN, BSN Entered By: Elliot Gurney, BSN, RN, CWS, Kim on 06/30/2018 15:32:32 Stanly, [name] ([id number]) -------------------------------------------------------------------------------- Lower Extremity Assessment Details Patient Name: Marcia [name] Date of Service: 06/30/2018 3:00 PM Medical Record Number: [id number] Patient Account Number: [id number] Date of Birth/Sex: 1946/01/25 (72 y.o. F) Treating RN: Huel Coventry Primary Care Ketura Sirek: Sherrie Mustache Other Clinician: Referring Abeera Flannery: Sherrie Mustache Treating Tray Klayman/Extender: Linwood Dibbles, HOYT Weeks in Treatment: 7 Edema Assessment Assessed: [Left: No] [Right: No] [Left: Edema] [Right: :] Calf Left: Right: Point of Measurement: 34 cm From Medial Instep cm 43.5 cm Ankle Left: Right: Point of Measurement: 12 cm From Medial Instep cm 23.5 cm Vascular Assessment Pulses: Dorsalis Pedis Palpable: [Left:Yes] Posterior Tibial Extremity colors, hair growth, and conditions: Extremity Color: [Left:Red] Hair Growth on Extremity: [Left:No] Temperature of Extremity: [Left:Warm] Capillary Refill: [Left:< 3 seconds] Toe Nail Assessment Left: Right: Thick: Yes Discolored: No Deformed: No Improper Length and Hygiene: No Electronic Signature(s) Signed: 06/30/2018 4:44:51 PM By: Elliot Gurney, BSN, RN, CWS, Kim RN, BSN Entered By: Elliot Gurney, BSN, RN, CWS, Kim on 06/30/2018 15:15:55 Milling, Keni AMarland Kitchen  ([id number]) -------------------------------------------------------------------------------- Multi Wound Chart Details Patient Name: Marcia [name] Date of Service: 06/30/2018 3:00 PM Medical Record Number: [id number] Patient Account Number: [id number] Date of Birth/Sex: 09/12/1946 (72 y.o. F) Treating RN: Huel Coventry Primary Care Lynnox Girten: Sherrie Mustache Other Clinician: Referring Kylea [name]: Sherrie Mustache Treating Ansley Mangiapane/Extender: Linwood Dibbles, HOYT Weeks in Treatment: 7 Vital Signs Height(in): 70 Pulse(bpm): 73 Weight(lbs):  193 Blood Pressure(mmHg): 136/46 Body Mass Index(BMI): 28 Temperature(F): 98.0 Respiratory Rate 16 (breaths/min): Photos: [3:No Photos] [4:No Photos] [5:No Photos] Wound Location: [3:Right, Posterior Lower Leg] [4:Right, Medial Malleolus] [5:Right Lower Leg - Lateral, Distal] Wounding Event: [3:Gradually Appeared] [4:Gradually Appeared] [5:Gradually Appeared] Primary Etiology: [3:Vasculitis] [4:Vasculitis] [5:Diabetic Wound/Ulcer of the Lower Extremity] 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] [5:N/A] Comorbid History: [3:N/A] [4:N/A] [5:Hypertension, Type II Diabetes, Neuropathy] Date Acquired: [3:03/12/2018] [4:03/12/2018] [5:06/28/2018] Weeks of Treatment: [3:7] [4:7] [5:0] Wound Status: [3:Open] [4:Open] [5:Open] Clustered Wound: [3:No] [4:Yes] [5:Yes] Clustered Quantity: [3:N/A] [4:N/A] [5:4] Measurements L x W x D [3:2.2x1.8x0.2] [4:10.8x4.5x0.2] [5:5x5x0.1] (cm) Area (cm) : [3:3.11] [4:38.17] [5:19.635] Volume (cm) : [3:0.622] [4:7.634] [5:1.963] % Reduction in Area: [3:-193.40%] [4:-110.40%] [5:N/A] % Reduction in Volume: [3:-486.80%] [4:-110.40%] [5:N/A] Classification: [3:Full Thickness Without Exposed Support Structures] [4:Full Thickness Without Exposed Support Structures] [5:Grade 1] Exudate Amount: [3:N/A] [4:N/A] [5:Large] Exudate Type: [3:N/A] [4:N/A]  [5:Serous] Exudate Color: [3:N/A] [4:N/A] [5:amber] Wound Margin: [3:N/A] [4:N/A] [5:Indistinct, nonvisible] Granulation Amount: [3:N/A] [4:N/A] [5:None Present (0%)] Necrotic Amount: [3:N/A] [4:N/A] [5:None Present (0%)] Epithelialization: [3:N/A] [4:N/A] [5:Large (67-100%)] Periwound Skin Texture: [3:No Abnormalities Noted] [4:No Abnormalities Noted] [5:Excoriation: No Induration: No Callus: No Crepitus: No Rash: No Scarring: No] Periwound Skin Moisture: [3:No Abnormalities Noted] [4:No Abnormalities Noted] Maceration: Yes Dry/Scaly: No Periwound Skin Color: No Abnormalities Noted No Abnormalities Noted Rubor: Yes Atrophie Blanche: No Cyanosis: No Ecchymosis: No Erythema: No Hemosiderin Staining: No Mottled: No Pallor: No Temperature: N/A N/A No Abnormality Tenderness on Palpation: No No No Wound Preparation: N/A N/A Topical Anesthetic Applied: None Treatment Notes Electronic Signature(s) Signed: 06/30/2018 4:44:51 PM By: Elliot Gurney, BSN, RN, CWS, Kim RN, BSN Entered By: Elliot Gurney, BSN, RN, CWS, Kim on 06/30/2018 15:19:46 Raschke, [name] ([id number]) -------------------------------------------------------------------------------- Multi-Disciplinary Care Plan Details Patient Name: Marcia [name] Date of Service: 06/30/2018 3:00 PM Medical Record Number: [id number] Patient Account Number: [id number] Date of Birth/Sex: [birth year] (72 y.o. F) Treating RN: Huel Coventry Primary Care Damone Fancher: Sherrie Mustache Other Clinician: Referring Tamicka Shimon: Sherrie Mustache Treating Dalisha Shively/Extender: Linwood Dibbles, HOYT Weeks in Treatment: 7 Active Inactive ` Abuse / Safety / Falls / Self Care Management Nursing Diagnoses: Potential for falls Goals: Patient will not experience any injury related to falls Date Initiated: 05/12/2018 Target Resolution Date: 08/15/2018 Goal Status: Active Interventions: Assess Activities of Daily Living upon admission and as needed Assess fall risk on admission and as  needed Assess: immobility, friction, shearing, incontinence upon admission and as needed Assess impairment of mobility on admission and as needed per policy Assess personal safety and home safety (as indicated) on admission and as needed Notes: ` Nutrition Nursing Diagnoses: Imbalanced nutrition Impaired glucose control: actual or potential Goals: Patient/caregiver agrees to and verbalizes understanding of need to use nutritional supplements and/or vitamins as prescribed Date Initiated: 05/12/2018 Target Resolution Date: 09/12/2018 Goal Status: Active Patient/caregiver will maintain therapeutic glucose control Date Initiated: 05/12/2018 Target Resolution Date: 08/15/2018 Goal Status: Active Interventions: Assess patient nutrition upon admission and as needed per policy Provide education on elevated blood sugars and impact on wound healing Provide education on nutrition Notes: [name], [name] ([id number]) Orientation to the Wound Care Program Nursing Diagnoses: Knowledge deficit related to the wound healing center program Goals: Patient/caregiver will verbalize understanding of the Wound Healing Center Program Date Initiated: 05/12/2018 Target Resolution Date: 06/13/2018 Goal Status: Active Interventions: Provide education on orientation to the wound center Notes: ` Wound/Skin Impairment Nursing Diagnoses: Impaired tissue integrity Knowledge deficit related to  ulceration/compromised skin integrity Goals: Ulcer/skin breakdown will have a volume reduction of 80% by week 12 Date Initiated: 05/12/2018 Target Resolution Date: 09/05/2018 Goal Status: Active Interventions: Assess patient/caregiver ability to perform ulcer/skin care regimen upon admission and as needed Assess ulceration(s) every visit Notes: Electronic Signature(s) Signed: 06/30/2018 4:44:51 PM By: Elliot Gurney, BSN, RN, CWS, Kim RN, BSN Entered By: Elliot Gurney, BSN, RN, CWS, Kim on 06/30/2018 15:19:38 Risser, [name]  ([id number]) -------------------------------------------------------------------------------- Pain Assessment Details Patient Name: Marcia [name] Date of Service: 06/30/2018 3:00 PM Medical Record Number: [id number] Patient Account Number: [id number] Date of Birth/Sex: 10-31-1945 (72 y.o. F) Treating RN: Huel Coventry Primary Care Tenise Stetler: Sherrie Mustache Other Clinician: Referring Cloteal Isaacson: Sherrie Mustache Treating Jaramiah Bossard/Extender: Linwood Dibbles, HOYT Weeks in Treatment: 7 Active Problems Location of Pain Severity and Description of Pain Patient Has Paino No Site Locations Pain Management and Medication Current Pain Management: Electronic Signature(s) Signed: 06/30/2018 4:44:51 PM By: Elliot Gurney, BSN, RN, CWS, Kim RN, BSN Entered By: Elliot Gurney, BSN, RN, CWS, Kim on 06/30/2018 15:01:39 Valls, [name] ([id number]) -------------------------------------------------------------------------------- Patient/Caregiver Education Details Patient Name: Marcia [name] Date of Service: 06/30/2018 3:00 PM Medical Record Number: [id number] Patient Account Number: [id number] Date of Birth/Gender: Feb 25, 1946 (72 y.o. F) Treating RN: Huel Coventry Primary Care Physician: Sherrie Mustache Other Clinician: Referring Physician: Sherrie Mustache Treating Physician/Extender: Skeet Simmer in Treatment: 7 Education Assessment Education Provided To: Patient Education Topics Provided Venous: Wound/Skin Impairment: Handouts: Skin Care Do's and Dont's Methods: Demonstration Responses: State content correctly Electronic Signature(s) Signed: 06/30/2018 4:44:51 PM By: Elliot Gurney, BSN, RN, CWS, Kim RN, BSN Entered By: Elliot Gurney, BSN, RN, CWS, Kim on 06/30/2018 15:31:20 Peugh, [name] ([id number]) -------------------------------------------------------------------------------- Wound Assessment Details Patient Name: Marcia [name] Date of Service: 06/30/2018 3:00 PM Medical Record Number: [id number] Patient Account Number:  [id number] Date of Birth/Sex: 03-21-1946 (72 y.o. F) Treating RN: Huel Coventry Primary Care Taichi Repka: Sherrie Mustache Other Clinician: Referring Harley Fitzwater: Sherrie Mustache Treating Dani Wallner/Extender: Linwood Dibbles, HOYT Weeks in Treatment: 7 Wound Status Wound Number: 3 Primary Etiology: Vasculitis Wound Location: Right, Posterior Lower Leg Secondary Diabetic Wound/Ulcer of the Lower Etiology: Extremity Wounding Event: Gradually Appeared Wound Status: Open Date Acquired: 03/12/2018 Weeks Of Treatment: 7 Clustered Wound: No Photos Photo Uploaded By: Curtis Sites on 06/30/2018 15:54:20 Wound Measurements Length: (cm) 2.2 Width: (cm) 1.8 Depth: (cm) 0.2 Area: (cm) 3.11 Volume: (cm) 0.622 % Reduction in Area: -193.4% % Reduction in Volume: -486.8% Wound Description Full Thickness Without Exposed Support Classification: Structures Periwound Skin Texture Texture Color No Abnormalities Noted: No No Abnormalities Noted: No Moisture No Abnormalities Noted: No Treatment Notes Wound #3 (Right, Posterior Lower Leg) 4. Dressing Applied: Other dressing (specify in notes) Notes Silvercell, ABD, Conform Bialy, Teddi A. ([id number]) Electronic Signature(s) Signed: 06/30/2018 4:44:51 PM By: Elliot Gurney, BSN, RN, CWS, Kim RN, BSN Entered By: Elliot Gurney, BSN, RN, CWS, Kim on 06/30/2018 15:11:03 Ellithorpe, [name] ([id number]) -------------------------------------------------------------------------------- Wound Assessment Details Patient Name: Marcia [name] Date of Service: 06/30/2018 3:00 PM Medical Record Number: [id number] Patient Account Number: [id number] Date of Birth/Sex: 12-19-1945 (72 y.o. F) Treating RN: Huel Coventry Primary Care Vernetta Dizdarevic: Sherrie Mustache Other Clinician: Referring Elainah Rhyne: Sherrie Mustache Treating Cambrea Kirt/Extender: Linwood Dibbles, HOYT Weeks in Treatment: 7 Wound Status Wound Number: 4 Primary Etiology: Vasculitis Wound Location: Right, Medial Malleolus Secondary Diabetic  Wound/Ulcer of the Lower Etiology: Extremity Wounding Event: Gradually Appeared Wound Status: Open Date Acquired: 03/12/2018 Weeks Of Treatment: 7 Clustered Wound: Yes Photos Photo Uploaded By: Curtis Sites on 06/30/2018 15:54:20 Wound Measurements Length: (cm)  10.8 Width: (cm) 4.5 Depth: (cm) 0.2 Area: (cm) 38.17 Volume: (cm) 7.634 % Reduction in Area: -110.4% % Reduction in Volume: -110.4% Wound Description Full Thickness Without Exposed Support Classification: Structures Periwound Skin Texture Texture Color No Abnormalities Noted: No No Abnormalities Noted: No Moisture No Abnormalities Noted: No Treatment Notes Wound #4 (Right, Medial Malleolus) 4. Dressing Applied: Other dressing (specify in notes) Notes Silvercell, ABD, Conform Beretta, Yuritzi A. ([id number]) Electronic Signature(s) Signed: 06/30/2018 4:44:51 PM By: Elliot Gurney, BSN, RN, CWS, Kim RN, BSN Entered By: Elliot Gurney, BSN, RN, CWS, Kim on 06/30/2018 15:11:03 Hable, [name] ([id number]) -------------------------------------------------------------------------------- Wound Assessment Details Patient Name: Marcia [name] Date of Service: 06/30/2018 3:00 PM Medical Record Number: [id number] Patient Account Number: [id number] Date of Birth/Sex: 14-Mar-1946 (72 y.o. F) Treating RN: Huel Coventry Primary Care Chelsey Kimberley: Sherrie Mustache Other Clinician: Referring Charlese Gruetzmacher: Sherrie Mustache Treating Waylin Dorko/Extender: Linwood Dibbles, HOYT Weeks in Treatment: 7 Wound Status Wound Number: 5 Primary Etiology: Diabetic Wound/Ulcer of the Lower Extremity Wound Location: Right Lower Leg - Lateral, Distal Wound Status: Open Wounding Event: Gradually Appeared Comorbid Hypertension, Type II Diabetes, Date Acquired: 06/28/2018 History: Neuropathy Weeks Of Treatment: 0 Clustered Wound: Yes Photos Photo Uploaded By: Curtis Sites on 06/30/2018 15:54:42 Wound Measurements Length: (cm) 5 % Red Width: (cm) 5 % Red Depth: (cm) 0.1  Epith Clustered Quantity: 4 Tunne Area: (cm) 19.635 Unde Volume: (cm) 1.963 uction in Area: uction in Volume: elialization: Large (67-100%) ling: No rmining: No Wound Description Classification: Grade 1 Wound Margin: Indistinct, nonvisible Exudate Amount: Large Exudate Type: Serous Exudate Color: amber Foul Odor After Cleansing: No Slough/Fibrino No Wound Bed Granulation Amount: None Present (0%) Exposed Structure Necrotic Amount: None Present (0%) Fascia Exposed: No Fat Layer (Subcutaneous Tissue) Exposed: No Tendon Exposed: No Muscle Exposed: No Joint Exposed: No Bone Exposed: No Limited to Skin Breakdown Emigh, Genavie A. ([id number]) Periwound Skin Texture Texture Color No Abnormalities Noted: No No Abnormalities Noted: No Callus: No Atrophie Blanche: No Crepitus: No Cyanosis: No Excoriation: No Ecchymosis: No Induration: No Erythema: No Rash: No Hemosiderin Staining: No Scarring: No Mottled: No Pallor: No Moisture Rubor: Yes No Abnormalities Noted: No Dry / Scaly: No Temperature / Pain Maceration: Yes Temperature: No Abnormality Wound Preparation Topical Anesthetic Applied: None Treatment Notes Wound #5 (Right, Distal, Lateral Lower Leg) 4. Dressing Applied: Other dressing (specify in notes) Notes Silvercell, ABD, Conform Electronic Signature(s) Signed: 06/30/2018 4:44:51 PM By: Elliot Gurney, BSN, RN, CWS, Kim RN, BSN Entered By: Elliot Gurney, BSN, RN, CWS, Kim on 06/30/2018 15:14:07 Clinger, [name] ([id number]) -------------------------------------------------------------------------------- Vitals Details Patient Name: Marcia [name] Date of Service: 06/30/2018 3:00 PM Medical Record Number: [id number] Patient Account Number: [id number] Date of Birth/Sex: 1946-05-20 (72 y.o. F) Treating RN: Huel Coventry Primary Care Ladarrian Asencio: Sherrie Mustache Other Clinician: Referring Lakshmi Sundeen: Sherrie Mustache Treating Annika Selke/Extender: Linwood Dibbles, HOYT Weeks in Treatment:  7 Vital Signs Time Taken: 15:01 Temperature (F): 98.0 Height (in): 70 Pulse (bpm): 73 Weight (lbs): 193 Respiratory Rate (breaths/min): 16 Body Mass Index (BMI): 27.7 Blood Pressure (mmHg): 136/46 Reference Range: 80 - 120 mg / dl Electronic Signature(s) Signed: 06/30/2018 4:44:51 PM By: Elliot Gurney, BSN, RN, CWS, Kim RN, BSN Entered By: Elliot Gurney, BSN, RN, CWS, Kim on 06/30/2018 15:03:52

## 2018-07-02 NOTE — Progress Notes (Signed)
TAELYNN, BANGS ([id number]) Visit Report for 06/30/2018 Chief Complaint Document Details Patient Name: [name], [name] Date of Service: 06/30/2018 3:00 PM Medical Record Number: [id number] Patient Account Number: [id number] Date of Birth/Sex: Dec 26, 1945 (72 y.o. F) Treating RN: Curtis Sites Primary Care Provider: Sherrie Mustache Other Clinician: Referring Provider: Sherrie Mustache Treating Provider/Extender: Skeet Simmer in Treatment: 7 Information Obtained from: Patient Chief Complaint RLE wounds Electronic Signature(s) Signed: 06/30/2018 5:36:35 PM By: Lenda Kelp PA-C Entered By: Lenda Kelp on 06/30/2018 15:17:34 Benko, [name] ([id number]) -------------------------------------------------------------------------------- HPI Details Patient Name: Marcia [name] Date of Service: 06/30/2018 3:00 PM Medical Record Number: [id number] Patient Account Number: [id number] Date of Birth/Sex: October 03, 1946 (72 y.o. F) Treating RN: Curtis Sites Primary Care Provider: Sherrie Mustache Other Clinician: Referring Provider: Sherrie Mustache Treating Provider/Extender: Linwood Dibbles, Pryor Guettler Weeks in Treatment: 7 History of Present Illness HPI Description: 05/12/18-She is seen in initial evaluation for multiple open areas to the right lower extremity. She was recently hospitalized (7/30-8/2) with cellulitis of the right leg, failed outpatient therapy with Keflex. While hospitalized she was treated with IV vancomycin and cefepime; right foot x-ray was negative for osteomyelitis. She admits to intermittent pain, improved since hospitalization. She was discharged on doxycycline which she should complete with the next 48 hours. 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 She has a remote history of following with vascular medicine for venous reflux, she does not tolerate wearing compression stockings d/t skin  sensitivities and "burning" sensation. In office ABI are elevated but she has strongly palpable pulses. We will initiate hydrofera blue and ace wrap compression. She is a diabetic with A1c 6.9 while hospitalized. 05/19/18- She is seen in follow up evaluation for RLE wounds; the lateral area is healed. She did tolerate ace wrap for compression, but experienced increased xerosis. We will hold off on ace wrap and increase lachydrin to twice daily. She c/o pain to the medial cluster of wounds, there is no apparent infection and will initiate steroid cream. She will follow up next week 05/26/18 on evaluation today patient appears to actually be having some issues currently with pain. She has been utilizing the dressings as previously recommended for the lower extremities. With that being said she does appear to have a little bit of a rash she states that anytime it he's in for anything else touches her skin that she will often develop a rash. She's not even sure that should be able to tolerate a compression wrap due to the fact that again she would be concerned about even the cotton layer being in contact with her skin which overtime will cause a burning sensation. She has seen dermatology concerning this unfortunately they really did not have a good answer for her as to why this is occurring. Fortunately there is no evidence of infection at this time. 05/29/18 on evaluation today patient was actually supposed to be back in the office in order to have a dressings/wrap change is a nurse visit. With that being said she was having significant discomfort with want to touch around the wound and. It green discharge. Subsequently the concern was that she had an infection so we did convert her to a physician visit instead of just a nurse visit. Subsequently upon evaluation she does seem to have purulent discharge along with evidence of cellulitis extending up the leg which is definitely worse than when I saw her  earlier in the week. Fortunately there does not appear to be any evidence of systemic infection at this point. No fevers, chills, nausea, or vomiting noted at this time. 06/02/18 on evaluation today the patient appears to be doing better in regard to the overall appearance in regard to the wound bed. She still does have slough covering the surface of the wounds and due to pain really I do not think sharp debridement is going to be advisable at this point. Nonetheless I do feel like that she is definitely showing signs of improvement compared to her previous evaluation. Her pain level is still rather high she tells me at times 8-9/10. Unfortunately she is having a difficult time getting into pain management she states that the one that we referred her to stated it would be at least two weeks before they can get her in. 06/12/18 on evaluation today patient actually appears to be doing much better in regard to her lower extremity wounds. In fact there does not appear to be any evidence of infection at this time I do believe the Cipro has been effective her pain is also greatly improved compared to previous. Nonetheless she does have some Slough noted that is gonna require debridement. Patient's MRI was reviewed and revealed that she had no evidence of osteomyelitis, septic arthritis, or absence. She did have blisters noted on the surface of the foot which again visually we see today as well. 06/16/18 on evaluation today patient actually appears to be doing fairly well in regard to her lower extremity ulcerations. With that being said she does have issues at this point with discomfort although she did see the pain management physician this morning he did give her medication to help in this regard. She tells me that after I debrided her wound last week that she actually had significant pain to the point that she ended up having a difficult time even driving to get home. Obviously when she left the clinic she  was not in that much pain this somewhat surprises me especially since we put lidocaine on her wounds prior to applying the dressings and the dressing we were using was Prisma which is not notorious for causing discomfort and Bickley, Uma A. ([id number]) burning. Nonetheless she did obviously experience this and she tells me that she set intermittent spells where this occurred in the week since I last saw her. Nonetheless she has been given the pain medications necessary in order to keep things under control at this point. Obviously this is short term but nonetheless I think will be helpful in getting the wounds to heal so that we can appropriately debrided do what we need to do. 06/23/18 on evaluation today patient actually appears to be doing significantly better in regard to her right lower extremity ulcerations. In fact she has made dramatic improvement pretty much all locations which is great news. Fortunately there's no evidence of infection at this time. Her irritation and inflammation also is better than I do believe that the treatment utilizing the Santyl has been beneficial over the past week. I'm not even sure that we need that any further. 06/30/18 on evaluation today patient actually is doing worse compared to her previous evaluation. Unfortunately she's been having increased drainage, pain, and overall I feel like this is likely due to infection based on the appearance of her wound today. In general the patient seems to have digressed although not really back to where she started although definitely not as well she's been  doing in the past couple of weeks. She's not having green drainage like she did previous but she is having a lot of drainage to the point that she's actually dripping from the posterior portion of her leg at this time. Electronic Signature(s) Signed: 06/30/2018 5:36:35 PM By: Lenda Kelp PA-C Entered By: Lenda Kelp on 06/30/2018 16:34:39 Ferreras, [name]  ([id number]) -------------------------------------------------------------------------------- Physical Exam Details Patient Name: Marcia [name] Date of Service: 06/30/2018 3:00 PM Medical Record Number: [id number] Patient Account Number: [id number] Date of Birth/Sex: Aug 08, 1946 (72 y.o. F) Treating RN: Curtis Sites Primary Care Provider: Sherrie Mustache Other Clinician: Referring Provider: Sherrie Mustache Treating Provider/Extender: Linwood Dibbles, Opel Lejeune Weeks in Treatment: 7 Constitutional Well-nourished and well-hydrated in no acute distress. Respiratory normal breathing without difficulty. clear to auscultation bilaterally. Cardiovascular regular rate and rhythm with normal S1, S2. Psychiatric this patient is able to make decisions and demonstrates good insight into disease process. Alert and Oriented x 3. pleasant and cooperative. Notes Patient's wounds really do not show evidence of significant slough although she does have significant erythema surrounding at this time. Nonetheless I do believe that she would benefit from likely restarting her on an antibiotic I would recommend something broad spectrum such as Bactrim or doxycycline. Due to the interaction of the Bactrim with her lisinopril causing some potassium issues my suggestion would be that we actually utilize doxycycline this is what I'm sending in for her. Also think she may benefit from washing with an antibacterial wash at this time. Electronic Signature(s) Signed: 06/30/2018 5:36:35 PM By: Lenda Kelp PA-C Entered By: Lenda Kelp on 06/30/2018 16:35:29 Schwartzman, [name] ([id number]) -------------------------------------------------------------------------------- Physician Orders Details Patient Name: Marcia [name] Date of Service: 06/30/2018 3:00 PM Medical Record Number: [id number] Patient Account Number: [id number] Date of Birth/Sex: 1946/05/05 (72 y.o. 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 - Wash with Hibiclens. Wound #4 Right,Medial Malleolus o May Shower, gently pat wound dry prior to applying new dressing. - Wash with Hibiclens. Wound #5 Right,Distal,Lateral Lower Leg o May Shower, gently pat wound dry prior to applying new dressing. - Wash with Hibiclens. Primary Wound Dressing Wound #3 Right,Posterior Lower Leg o Silver Alginate Wound #4 Right,Medial Malleolus o Silver Alginate Wound #5 Right,Distal,Lateral Lower Leg o Silver Alginate Secondary Dressing Wound #3 Right,Posterior Lower Leg o ABD and Kerlix/Conform Wound #4 Right,Medial Malleolus o ABD and Kerlix/Conform Wound #5 Right,Distal,Lateral Lower Leg o ABD and Kerlix/Conform Dressing Change Frequency Wound #3 Right,Posterior Lower Leg o Change dressing every day. Wound #4 Right,Medial Malleolus o Change dressing every day. SHALAINA, GUARDIOLA A. ([id number]) Wound #5 Right,Distal,Lateral Lower Leg o Change dressing every day. Follow-up Appointments Wound #3 Right,Posterior Lower Leg o Return Appointment in 1 week. Wound #4 Right,Medial Malleolus o Return Appointment in 1 week. Wound #5 Right,Distal,Lateral Lower Leg o Return Appointment in 1 week. Edema Control Wound #3 Right,Posterior Lower Leg o Elevate legs to the level of the heart and pump ankles as often as possible Wound #4 Right,Medial Malleolus o Elevate  legs to the level of the  heart and pump ankles as often as possible Wound #5 Right,Distal,Lateral Lower Leg o Elevate legs to the level of the heart and pump ankles as often as possible Medications-please add to medication list. Wound #3 Right,Posterior Lower Leg o P.O. Antibiotics Wound #4 Right,Medial Malleolus o P.O. Antibiotics Wound #5 Right,Distal,Lateral Lower Leg o P.O. Antibiotics Laboratory o Bacteria identified in Wound by Culture (MICRO) - Right Lower Leg oooo LOINC Code: 6462-6 oooo Convenience Name: Wound culture routine Patient Medications Allergies: No Known Allergies Notifications Medication Indication Start End doxycycline hyclate 06/30/2018 DOSE 1 - oral 100 mg capsule - 1 capsule oral taken 2 times a day for 14 days Electronic Signature(s) Signed: 06/30/2018 3:39:23 PM By: Lenda Kelp PA-C Entered By: Lenda Kelp on 06/30/2018 15:39:22 Markos, [name] ([id number]) Harada, [name] ([id number]) -------------------------------------------------------------------------------- Problem List Details Patient Name: Marcia [name] Date of Service: 06/30/2018 3:00 PM Medical Record Number: [id number] Patient Account Number: [id number] Date of Birth/Sex: November 22, 1945 (72 y.o. 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 05/12/2018 No Yes L97.212 Non-pressure chronic ulcer of right calf with fat layer exposed 05/12/2018 No Yes L97.312 Non-pressure chronic ulcer of right ankle with fat layer 05/12/2018 No Yes exposed E11.622 Type 2 diabetes mellitus with other skin ulcer 05/12/2018 No Yes Inactive Problems Resolved Problems Electronic Signature(s) Signed: 06/30/2018 5:36:35 PM By: Lenda Kelp PA-C Entered By: Lenda Kelp on  06/30/2018 15:17:19 Romanello, Mariesha AMarland Kitchen ([id number]) -------------------------------------------------------------------------------- Progress Note Details Patient Name: Marcia [name] Date of Service: 06/30/2018 3:00 PM Medical Record Number: [id number] Patient Account Number: [id number] Date of Birth/Sex: 1946-06-21 (72 y.o. F) Treating RN: Curtis Sites Primary Care Provider: Sherrie Mustache Other Clinician: Referring Provider: Sherrie Mustache Treating Provider/Extender: Linwood Dibbles, Lindsea Olivar Weeks in Treatment: 7 Subjective Chief Complaint Information obtained from Patient RLE wounds History of Present Illness (HPI) 05/12/18-She is seen in initial evaluation for multiple open areas to the right lower extremity. She was recently hospitalized (7/30-8/2) with cellulitis of the right leg, failed outpatient therapy with Keflex. While hospitalized she was treated with IV vancomycin and cefepime; right foot x-ray was negative for osteomyelitis. She admits to intermittent pain, improved since hospitalization. She was discharged on doxycycline which she should complete with the next 48 hours. 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 She has a remote history of following with vascular medicine for venous reflux, she does not tolerate wearing compression stockings d/t skin sensitivities and "burning" sensation. In office ABI are elevated but she has strongly palpable pulses. We will initiate hydrofera blue and ace wrap compression. She is a diabetic with A1c 6.9 while hospitalized. 05/19/18- She is seen in follow up evaluation for RLE wounds; the lateral area is healed. She did tolerate ace wrap for compression, but experienced increased xerosis. We will hold off on ace wrap and increase lachydrin to twice daily. She c/o pain to the medial cluster of wounds, there is no apparent infection and will initiate steroid  cream. She will follow up next week 05/26/18 on evaluation today patient appears to actually be having some issues currently with pain. She has been utilizing the dressings as previously recommended for the lower extremities. With that being said she  does appear to have a little bit of a rash she states that anytime it he's in for anything else touches her skin that she will often develop a rash. She's not even sure that should be able to tolerate a compression wrap due to the fact that again she would be concerned about even the cotton layer being in contact with her skin which overtime will cause a burning sensation. She has seen dermatology concerning this unfortunately they really did not have a good answer for her as to why this is occurring. Fortunately there is no evidence of infection at this time. 05/29/18 on evaluation today patient was actually supposed to be back in the office in order to have a dressings/wrap change is a nurse visit. With that being said she was having significant discomfort with want to touch around the wound and. It green discharge. Subsequently the concern was that she had an infection so we did convert her to a physician visit instead of just a nurse visit. Subsequently upon evaluation she does seem to have purulent discharge along with evidence of cellulitis extending up the leg which is definitely worse than when I saw her earlier in the week. Fortunately there does not appear to be any evidence of systemic infection at this point. No fevers, chills, nausea, or vomiting noted at this time. 06/02/18 on evaluation today the patient appears to be doing better in regard to the overall appearance in regard to the wound bed. She still does have slough covering the surface of the wounds and due to pain really I do not think sharp debridement is going to be advisable at this point. Nonetheless I do feel like that she is definitely showing signs of improvement compared to her  previous evaluation. Her pain level is still rather high she tells me at times 8-9/10. Unfortunately she is having a difficult time getting into pain management she states that the one that we referred her to stated it would be at least two weeks before they can get her in. 06/12/18 on evaluation today patient actually appears to be doing much better in regard to her lower extremity wounds. In fact there does not appear to be any evidence of infection at this time I do believe the Cipro has been effective her pain is also greatly improved compared to previous. Nonetheless she does have some Slough noted that is gonna require debridement. Patient's MRI was reviewed and revealed that she had no evidence of osteomyelitis, septic arthritis, or absence. She did have blisters noted on the surface of the foot which again visually we see today as well. IRLANDA, CROGHAN ([id number]) 06/16/18 on evaluation today patient actually appears to be doing fairly well in regard to her lower extremity ulcerations. With that being said she does have issues at this point with discomfort although she did see the pain management physician this morning he did give her medication to help in this regard. She tells me that after I debrided her wound last week that she actually had significant pain to the point that she ended up having a difficult time even driving to get home. Obviously when she left the clinic she was not in that much pain this somewhat surprises me especially since we put lidocaine on her wounds prior to applying the dressings and the dressing we were using was Prisma which is not notorious for causing discomfort and burning. Nonetheless she did obviously experience this and she tells me that she set intermittent spells  where this occurred in the week since I last saw her. Nonetheless she has been given the pain medications necessary in order to keep things under control at this point. Obviously this is short  term but nonetheless I think will be helpful in getting the wounds to heal so that we can appropriately debrided do what we need to do. 06/23/18 on evaluation today patient actually appears to be doing significantly better in regard to her right lower extremity ulcerations. In fact she has made dramatic improvement pretty much all locations which is great news. Fortunately there's no evidence of infection at this time. Her irritation and inflammation also is better than I do believe that the treatment utilizing the Santyl has been beneficial over the past week. I'm not even sure that we need that any further. 06/30/18 on evaluation today patient actually is doing worse compared to her previous evaluation. Unfortunately she's been having increased drainage, pain, and overall I feel like this is likely due to infection based on the appearance of her wound today. In general the patient seems to have digressed although not really back to where she started although definitely not as well she's been doing in the past couple of weeks. She's not having green drainage like she did previous but she is having a lot of drainage to the point that she's actually dripping from the posterior portion of her leg at this time. Patient History Information obtained from Patient. Family History Cancer - Siblings, Diabetes - Mother,Father,Siblings, Heart Disease - Father, Hypertension - Father, Stroke - Father,Mother, No family history of Kidney Disease, Lung Disease, Seizures, Thyroid Problems, Tuberculosis. Social History Former smoker - 10-15 years quit, Marital Status - Married, Alcohol Use - Never, Drug Use - No History, Caffeine Use - Daily. Medical And Surgical History Notes Constitutional Symptoms (General Health) vitamin d deficiency Respiratory nodule of lung Cardiovascular dyspnea 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, AVV Musculoskeletal arthritis Review of Systems (ROS) Constitutional Symptoms  (General Health) Denies complaints or symptoms of Fever, Chills. Respiratory The patient has no complaints or symptoms. Cardiovascular Complains or has symptoms of LE edema. Psychiatric The patient has no complaints or symptoms. CONNY, SITU A. ([id number]) Objective Constitutional Well-nourished and well-hydrated in no acute distress. Vitals Time Taken: 3:01 PM, Height: 70 in, Weight: 193 lbs, BMI: 27.7, Temperature: 98.0 F, Pulse: 73 bpm, Respiratory Rate: 16 breaths/min, Blood Pressure: 136/46 mmHg. Respiratory normal breathing without difficulty. clear to auscultation bilaterally. Cardiovascular regular rate and rhythm with normal S1, S2. Psychiatric this patient is able to make decisions and demonstrates good insight into disease process. Alert and Oriented x 3. pleasant and cooperative. General Notes: Patient's wounds really do not show evidence of significant slough although she does have significant erythema surrounding at this time. Nonetheless I do believe that she would benefit from likely restarting her on an antibiotic I would recommend something broad spectrum such as Bactrim or doxycycline. Due to the interaction of the Bactrim with her lisinopril causing some potassium issues my suggestion would be that we actually utilize doxycycline this is what I'm sending in for her. Also think she may benefit from washing with an antibacterial wash at this time. Integumentary (Hair, Skin) Wound #3 status is Open. Original cause of wound was Gradually Appeared. The wound is located on the Right,Posterior Lower Leg. The wound measures 2.2cm length x 1.8cm width x 0.2cm depth; 3.11cm^2 area and 0.622cm^3 volume. Wound #4 status is Open. Original cause of wound was Gradually Appeared. The wound  is located on the Right,Medial Malleolus. The wound measures 10.8cm length x 4.5cm width x 0.2cm depth; 38.17cm^2 area and 7.634cm^3 volume. Wound #5 status is Open. Original cause of wound was  Gradually Appeared. The wound is located on the Right,Distal,Lateral Lower Leg. The wound measures 5cm length x 5cm width x 0.1cm depth; 19.635cm^2 area and 1.963cm^3 volume. The wound is limited to skin breakdown. There is no tunneling or undermining noted. There is a large amount of serous drainage noted. The wound margin is indistinct and nonvisible. There is no granulation within the wound bed. There is no necrotic tissue within the wound bed. The periwound skin appearance exhibited: Maceration, Rubor. The periwound skin appearance did not exhibit: Callus, Crepitus, Excoriation, Induration, Rash, Scarring, Dry/Scaly, Atrophie Blanche, Cyanosis, Ecchymosis, Hemosiderin Staining, Mottled, Pallor, Erythema. Periwound temperature was noted as No Abnormality. 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 ([id number]) Plan Wound Cleansing: Wound #3 Right,Posterior Lower Leg: May Shower, gently pat wound dry prior to applying new dressing. - Wash with Hibiclens. Wound #4 Right,Medial Malleolus: May Shower, gently pat wound dry prior to applying new dressing. - Wash with Hibiclens. Wound #5 Right,Distal,Lateral Lower Leg: May Shower, gently pat wound dry prior to applying new dressing. - Wash with Hibiclens. Primary Wound Dressing: Wound #3 Right,Posterior Lower Leg: Silver Alginate Wound #4 Right,Medial Malleolus: Silver Alginate Wound #5 Right,Distal,Lateral Lower Leg: Silver Alginate Secondary Dressing: Wound #3 Right,Posterior Lower Leg: ABD and Kerlix/Conform Wound #4 Right,Medial Malleolus: ABD and Kerlix/Conform Wound #5 Right,Distal,Lateral Lower Leg: ABD and Kerlix/Conform Dressing Change Frequency: Wound #3 Right,Posterior Lower Leg: Change dressing every day. Wound #4  Right,Medial Malleolus: Change dressing every day. Wound #5 Right,Distal,Lateral Lower Leg: Change dressing every day. Follow-up Appointments: Wound #3 Right,Posterior Lower Leg: Return Appointment in 1 week. Wound #4 Right,Medial Malleolus: Return Appointment in 1 week. Wound #5 Right,Distal,Lateral Lower Leg: Return Appointment in 1 week. Edema Control: Wound #3 Right,Posterior Lower Leg: Elevate legs to the level of the heart and pump ankles as often as possible Wound #4 Right,Medial Malleolus: Elevate legs to the level of the heart and pump ankles as often as possible Wound #5 Right,Distal,Lateral Lower Leg: Elevate legs to the level of the heart and pump ankles as often as possible Medications-please add to medication list.: Wound #3 Right,Posterior Lower Leg: P.O. Antibiotics Wound #4 Right,Medial Malleolus: P.O. Antibiotics Wound #5 Right,Distal,Lateral Lower Leg: P.O. Antibiotics Laboratory ordered were: Wound culture routine - Right Lower Leg The following medication(s) was prescribed: doxycycline hyclate oral 100 mg capsule 1 1 capsule oral taken 2 times a day for 14 days starting 06/30/2018 MACKINZIE, VUNCANNON A. ([id number]) My suggestion at this point is gonna be that we initiate the above wound care measures and subsequently I'm also gonna recommend that she utilize Hibiclense per above the washer leg with each time she performs the dressing change. She's in agreement the plan. The doxycycline was sent to the pharmacy as well. If anything changes in the interim the patient will let me know. Please see above for specific wound care orders. We will see patient for re-evaluation in 1 week(s) here in the clinic. If anything worsens or changes patient will contact our office for additional recommendations. Electronic Signature(s) Signed: 06/30/2018 5:36:35 PM By: Lenda Kelp PA-C Entered By: Lenda Kelp on 06/30/2018 16:36:24 Cortopassi, Althia A.  (  [id number]) -------------------------------------------------------------------------------- ROS/PFSH Details Patient Name: ABIGAYL, HOR A. Date of Service: 06/30/2018 3:00 PM Medical Record Number: [id number] Patient Account Number: [id number] Date of Birth/Sex: [birth year] (72 y.o. F) Treating RN: Curtis Sites Primary Care Provider: Sherrie Mustache Other Clinician: Referring Provider: Sherrie Mustache Treating Provider/Extender: Linwood Dibbles, Lilliah Priego Weeks in Treatment: 7 Information Obtained From Patient Wound History Do you currently have one or more open woundso Yes How many open wounds do you currently haveo 5 Approximately how long have you had your woundso 6 How have you been treating your wound(s) until nowo abx Has your wound(s) ever healed and then re-openedo No Have you had any lab work done in the past montho Yes Who ordered the lab work doneo hospital Have you tested positive for an antibiotic resistant organism (MRSA, VRE)o No Have you tested positive for osteomyelitis (bone infection)o No Have you had any tests for circulation on your legso Yes Constitutional Symptoms (General Health) Complaints and Symptoms: Negative for: Fever; Chills Medical History: Past Medical History Notes: vitamin d deficiency Cardiovascular Complaints and Symptoms: Positive for: LE edema Medical History: Positive for: Hypertension Negative for: Angina; Arrhythmia; Congestive Heart Failure; Coronary Artery Disease; Deep Vein Thrombosis; Hypotension; Myocardial Infarction; Peripheral Arterial Disease; Peripheral Venous Disease; Phlebitis; Vasculitis Past Medical History Notes: dyspnea 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, [place] Medical History: Negative for: Cataracts; Glaucoma; Optic Neuritis Ear/Nose/Mouth/Throat Medical History: Negative for: Chronic sinus problems/congestion; Middle ear problems Hematologic/Lymphatic BRYANNE, RIQUELME A. ([id number]) Medical History: Negative for:  Anemia; Hemophilia; Human Immunodeficiency Virus; Lymphedema; Sickle Cell Disease Respiratory Complaints and Symptoms: No Complaints or Symptoms Medical History: Negative for: Aspiration; Asthma; Chronic Obstructive Pulmonary Disease (COPD); Pneumothorax; Sleep Apnea; Tuberculosis Past Medical History Notes: nodule of lung Gastrointestinal Medical History: Negative for: Cirrhosis ; Colitis; Crohnos; Hepatitis A; Hepatitis B; Hepatitis C Endocrine Medical History: Positive for: Type II Diabetes Time with diabetes: 10 years Treated with: Oral agents Blood sugar tested every day: Yes Tested : every mornimg Genitourinary Medical History: Negative for: End Stage Renal Disease Immunological Medical History: Negative for: Lupus Erythematosus; Raynaudos; Scleroderma Integumentary (Skin) Medical History: Negative for: History of Burn; History of pressure wounds Musculoskeletal Medical History: Negative for: Gout; Rheumatoid Arthritis; Osteoarthritis; Osteomyelitis Past Medical History Notes: arthritis Neurologic Medical History: Positive for: Neuropathy - peripheral Negative for: Dementia; Quadriplegia; Paraplegia; Seizure Disorder Oncologic JAZZMAN, LOUGHMILLER ([id number]) Medical History: Negative for: Received Chemotherapy; Received Radiation Psychiatric Complaints and Symptoms: No Complaints or Symptoms Medical History: Negative for: Anorexia/bulimia; Confinement Anxiety Immunizations Pneumococcal Vaccine: Received Pneumococcal Vaccination: Yes Tetanus Vaccine: Last tetanus shot: 05/12/2014 Immunization Notes: plan on flu shot Implantable Devices Family and Social History Cancer: Yes - Siblings; Diabetes: Yes - Mother,Father,Siblings; Heart Disease: Yes - Father; Hypertension: Yes - Father; Kidney Disease: No; Lung Disease: No; Seizures: No; Stroke: Yes - Father,Mother; Thyroid Problems: No; Tuberculosis: No; Former smoker - 10-15 years quit; Marital Status - Married;  Alcohol Use: Never; Drug Use: No History; Caffeine Use: Daily; Financial Concerns: No; Food, Clothing or Shelter Needs: No; Support System Lacking: No; Transportation Concerns: No; Advanced Directives: No; Patient does not want information on Advanced Directives; Living Will: No; Medical Power of Attorney: No Physician Affirmation I have reviewed and agree with the above information. Electronic Signature(s) Signed: 06/30/2018 5:36:35 PM By: Lenda Kelp PA-C Signed: 07/01/2018 5:04:30 PM By: Curtis Sites Entered By: Lenda Kelp on 06/30/2018 16:35:00 Thiam, [name] ([id number]) -------------------------------------------------------------------------------- SuperBill Details Patient Name: Marcia [name] Date of Service: 06/30/2018 Medical Record Number: [id number]  Patient Account Number: [id number] Date of Birth/Sex: 06-11-1946 (72 y.o. F) Treating RN: Curtis Sites Primary Care Provider: Sherrie Mustache Other Clinician: Referring Provider: Sherrie Mustache Treating Provider/Extender: Linwood Dibbles, Yisroel Mullendore Weeks in Treatment: 7 Diagnosis Coding ICD-10 Codes Code Description 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 L97.212 Non-pressure chronic ulcer of right calf with fat layer exposed L97.312 Non-pressure chronic ulcer of right ankle with fat layer exposed E11.622 Type 2 diabetes mellitus with other skin ulcer Facility Procedures CPT4 Code: [id number] Description: 99213 - WOUND CARE VISIT-LEV 3 EST PT Modifier: Quantity: 1 Physician Procedures CPT4 Code: [id number] Description: 99214 - WC PHYS LEVEL 4 - EST PT ICD-10 Diagnosis Description 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

## 2018-07-03 LAB — AEROBIC CULTURE  (SUPERFICIAL SPECIMEN)

## 2018-07-03 LAB — AEROBIC CULTURE W GRAM STAIN (SUPERFICIAL SPECIMEN): Gram Stain: NONE SEEN

## 2018-07-07 ENCOUNTER — Encounter: Payer: Medicare HMO | Attending: Physician Assistant | Admitting: Physician Assistant

## 2018-07-07 DIAGNOSIS — Z809 Family history of malignant neoplasm, unspecified: Secondary | ICD-10-CM | POA: Insufficient documentation

## 2018-07-07 DIAGNOSIS — Z823 Family history of stroke: Secondary | ICD-10-CM | POA: Diagnosis not present

## 2018-07-07 DIAGNOSIS — L97212 Non-pressure chronic ulcer of right calf with fat layer exposed: Secondary | ICD-10-CM | POA: Diagnosis present

## 2018-07-07 DIAGNOSIS — E559 Vitamin D deficiency, unspecified: Secondary | ICD-10-CM | POA: Diagnosis not present

## 2018-07-07 DIAGNOSIS — Z833 Family history of diabetes mellitus: Secondary | ICD-10-CM | POA: Insufficient documentation

## 2018-07-07 DIAGNOSIS — I10 Essential (primary) hypertension: Secondary | ICD-10-CM | POA: Diagnosis not present

## 2018-07-07 DIAGNOSIS — Z8249 Family history of ischemic heart disease and other diseases of the circulatory system: Secondary | ICD-10-CM | POA: Insufficient documentation

## 2018-07-07 DIAGNOSIS — E11622 Type 2 diabetes mellitus with other skin ulcer: Secondary | ICD-10-CM | POA: Insufficient documentation

## 2018-07-07 DIAGNOSIS — L97312 Non-pressure chronic ulcer of right ankle with fat layer exposed: Secondary | ICD-10-CM | POA: Diagnosis not present

## 2018-07-07 DIAGNOSIS — I776 Arteritis, unspecified: Secondary | ICD-10-CM | POA: Insufficient documentation

## 2018-07-07 DIAGNOSIS — E1140 Type 2 diabetes mellitus with diabetic neuropathy, unspecified: Secondary | ICD-10-CM | POA: Insufficient documentation

## 2018-07-11 NOTE — Progress Notes (Signed)
[name], [name] ([id number]) Visit Report for 07/07/2018 Arrival Information Details Patient Name: [name], [name] A. Date of Service: 07/07/2018 9:30 AM Medical Record Number: [id number] Patient Account Number: [id number] Date of Birth/Sex: Feb 23, 1946 (72 y.o. F) Treating RN: Curtis Sites Primary Care Ravindra Baranek: Sherrie Mustache Other Clinician: Referring Raja Caputi: Sherrie Mustache Treating Eural Holzschuh/Extender: Linwood Dibbles, HOYT Weeks in Treatment: 8 Visit Information History Since Last Visit Added or deleted any medications: No Patient Arrived: Ambulatory Any new allergies or adverse reactions: No Arrival Time: 09:31 Had a fall or experienced change in No Accompanied By: self activities of daily living that may affect Transfer Assistance: None risk of falls: Patient Identification Verified: Yes Signs or symptoms of abuse/neglect since last visito No Secondary Verification Process Completed: Yes Hospitalized since last visit: No Patient Requires Transmission-Based No Implantable device outside of the clinic excluding No Precautions: cellular tissue based products placed in the center Patient Has Alerts: Yes since last visit: Patient Alerts: DMII Has Dressing in Place as Prescribed: Yes Pain Present Now: Yes Electronic Signature(s) Signed: 07/07/2018 3:15:29 PM By: Dayton Martes RCP, RRT, CHT Entered By: Dayton Martes on 07/07/2018 09:32:33 Schimek, [name] ([id number]) -------------------------------------------------------------------------------- Encounter Discharge Information Details Patient Name: Marcia [name] Date of Service: 07/07/2018 9:30 AM Medical Record Number: [id number] Patient Account Number: [id number] Date of Birth/Sex: Oct 26, 1945 (72 y.o. F) Treating RN: Renne Crigler Primary Care Shenita Trego: Sherrie Mustache Other Clinician: Referring Tyner Codner: Sherrie Mustache Treating Lorretta Kerce/Extender: Linwood Dibbles, HOYT Weeks in Treatment: 8 Encounter  Discharge Information Items Discharge Condition: Stable Ambulatory Status: Ambulatory Discharge Destination: Home Transportation: Private Auto Schedule Follow-up Appointment: No Clinical Summary of Care: Post Procedure Vitals: Temperature (F): 97.9 Pulse (bpm): 70 Respiratory Rate (breaths/min): 18 Blood Pressure (mmHg): 153/62 Electronic Signature(s) Signed: 07/07/2018 4:16:12 PM By: Renne Crigler Entered By: Renne Crigler on 07/07/2018 10:19:36 [name], [name] ([id number]) -------------------------------------------------------------------------------- Lower Extremity Assessment Details Patient Name: Marcia [name] Date of Service: 07/07/2018 9:30 AM Medical Record Number: [id number] Patient Account Number: [id number] Date of Birth/Sex: October 01, 1946 (72 y.o. F) Treating RN: Huel Coventry Primary Care Erric Machnik: Sherrie Mustache Other Clinician: Referring Eternity Dexter: Sherrie Mustache Treating Ric Rosenberg/Extender: Linwood Dibbles, HOYT Weeks in Treatment: 8 Edema Assessment Assessed: [Left: No] [Right: No] [Left: Edema] [Right: :] Calf Left: Right: Point of Measurement: 34 cm From Medial Instep cm 42.4 cm Ankle Left: Right: Point of Measurement: 12 cm From Medial Instep cm 22 cm Vascular Assessment Pulses: Dorsalis Pedis Palpable: [Right:Yes] Posterior Tibial Extremity colors, hair growth, and conditions: Extremity Color: [Right:Red] Hair Growth on Extremity: [Right:No] Temperature of Extremity: [Right:Warm] Capillary Refill: [Right:< 3 seconds] Toe Nail Assessment Left: Right: Thick: Yes Discolored: Yes Deformed: No Improper Length and Hygiene: No Electronic Signature(s) Signed: 07/07/2018 4:33:57 PM By: Elliot Gurney, BSN, RN, CWS, Kim RN, BSN Entered By: Elliot Gurney, BSN, RN, CWS, Kim on 07/07/2018 09:43:01 [name], [name] ([id number]) -------------------------------------------------------------------------------- Multi Wound Chart Details Patient Name: Marcia [name] Date of Service:  07/07/2018 9:30 AM Medical Record Number: [id number] Patient Account Number: [id number] Date of Birth/Sex: January 27, 1946 (72 y.o. F) Treating RN: Curtis Sites Primary Care Jennings Corado: Sherrie Mustache Other Clinician: Referring Labaron Digirolamo: Sherrie Mustache Treating Shelaine Frie/Extender: Linwood Dibbles, HOYT Weeks in Treatment: 8 Vital Signs Height(in): 70 Pulse(bpm): 70 Weight(lbs): 193 Blood Pressure(mmHg): 153/62 Body Mass Index(BMI): 28 Temperature(F): 97.9 Respiratory Rate 16 (breaths/min): Photos: [3:No Photos] [4:No Photos] [5:No Photos] Wound Location: [3:Right Lower Leg - Posterior] [4:Right Malleolus - Medial] [5:Right Lower Leg - Lateral, Distal] Wounding Event: [3:Gradually Appeared] [4:Gradually Appeared] [5:Gradually Appeared] Primary Etiology: [3:Vasculitis] [4:Vasculitis] [  5:Diabetic Wound/Ulcer of the Lower Extremity] 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90%] % Reduction in Volume: [3:-144.30%] [4:98.30%] [5:89.90%] Classification: [3:Full Thickness Without Exposed Support Structures] [4:Full Thickness Without Exposed Support Structures] [5:Grade 1] Exudate Amount: [3:Medium] [4:Medium] [5:Medium] Exudate Type: [3:Serous] [4:Serous] [5:Serous] Exudate Color: [3:amber] [4:amber] [5:amber] Wound Margin: [3:Flat and  Intact] [4:Flat and Intact] [5:Indistinct, nonvisible] Granulation Amount: [3:Medium (34-66%)] [4:Medium (34-66%)] [5:Medium (34-66%)] Granulation Quality: [3:Pink, Pale] [4:Red] [5:Pink] Necrotic Amount: [3:Medium (34-66%)] [4:Medium (34-66%)] [5:Medium (34-66%)] Exposed Structures: [3:Fat Layer (Subcutaneous Tissue) Exposed: Yes Fascia: No Tendon: No Muscle: No Joint: No Bone: No] [4:Fat Layer (Subcutaneous Tissue) Exposed: Yes Fascia: No Tendon: No Muscle: No Joint: No Bone: No] [5:Fat Layer (Subcutaneous Tissue) Exposed: Yes  Fascia: No Tendon: No Muscle: No Joint: No Bone: No] Epithelialization: None Small (1-33%) Medium (34-66%) Periwound Skin Texture: No Abnormalities Noted Excoriation: No Excoriation: No Induration: No Induration: No Callus: No Callus: No Crepitus: No Crepitus: No Rash: No Rash: No Scarring: No Scarring: No Periwound Skin Moisture: Dry/Scaly: Yes Dry/Scaly: Yes Dry/Scaly: Yes Maceration: No Maceration: No Periwound Skin Color: No Abnormalities Noted Rubor: Yes Rubor: Yes Atrophie Blanche: No Atrophie Blanche: No Cyanosis: No Cyanosis: No Ecchymosis: No Ecchymosis: No Erythema: No Erythema: No Hemosiderin Staining: No Hemosiderin Staining: No Mottled: No Mottled: No Pallor: No Pallor: No Temperature: N/A N/A No Abnormality Tenderness on Palpation: No No No Wound Preparation: Ulcer Cleansing: Ulcer Cleansing: Ulcer Cleansing: Rinsed/Irrigated with Saline Rinsed/Irrigated with Saline Rinsed/Irrigated with Saline Topical Anesthetic Applied: Topical Anesthetic Applied: Topical Anesthetic Applied: Other: lidocaine 4% Other: lidocaine 4% Other: lidocaine 4% Treatment Notes Electronic Signature(s) Signed: 07/07/2018 5:00:45 PM By: Curtis Sites Entered By: Curtis Sites on 07/07/2018 09:58:38 [name], [name] ([id number]) -------------------------------------------------------------------------------- Multi-Disciplinary Care Plan  Details Patient Name: Marcia [name] Date of Service: 07/07/2018 9:30 AM Medical Record Number: [id number] Patient Account Number: [id number] Date of Birth/Sex: 01/09/1946 (72 y.o. F) Treating RN: Curtis Sites Primary Care Lariah Fleer: Sherrie Mustache Other Clinician: Referring Gianfranco Araki: Sherrie Mustache Treating Leesha Veno/Extender: Linwood Dibbles, HOYT Weeks in Treatment: 8 Active Inactive ` Abuse / Safety / Falls / Self Care Management Nursing Diagnoses: Potential for falls Goals: Patient will not experience any injury related to falls Date Initiated: 05/12/2018 Target Resolution Date: 08/15/2018 Goal Status: Active Interventions: Assess Activities of Daily Living upon admission and as needed Assess fall risk on admission and as needed Assess: immobility, friction, shearing, incontinence upon admission and as needed Assess impairment of mobility on admission and as needed per policy Assess personal safety and home safety (as indicated) on admission and as needed Notes: ` Nutrition Nursing Diagnoses: Imbalanced nutrition Impaired glucose control: actual or potential Goals: Patient/caregiver agrees to and verbalizes understanding of need to use nutritional supplements and/or vitamins as prescribed Date Initiated: 05/12/2018 Target Resolution Date: 09/12/2018 Goal Status: Active Patient/caregiver will maintain therapeutic glucose control Date Initiated: 05/12/2018 Target Resolution Date: 08/15/2018 Goal Status: Active Interventions: Assess patient nutrition upon admission and as needed per policy Provide education on elevated blood sugars and impact on wound healing Provide education on  nutrition Notes: [name], [name] ([id number]) Orientation to the Wound Care Program Nursing Diagnoses: Knowledge deficit related to the wound healing center program Goals: Patient/caregiver will verbalize understanding of the Wound Healing Center Program Date Initiated: 05/12/2018 Target Resolution Date:  06/13/2018 Goal Status: Active Interventions: Provide education on orientation to the wound center Notes: ` Wound/Skin Impairment Nursing Diagnoses: Impaired tissue integrity Knowledge deficit related to ulceration/compromised skin integrity Goals: Ulcer/skin breakdown will have a volume reduction of 80% by week 12 Date Initiated: 05/12/2018 Target Resolution Date: 09/05/2018 Goal Status: Active Interventions: Assess patient/caregiver ability to perform ulcer/skin care regimen upon admission and as needed Assess ulceration(s) every visit Notes: Electronic Signature(s) Signed: 07/07/2018 5:00:45 PM By: Curtis Sites Entered By: Curtis Sites on 07/07/2018 09:58:16 [name], [name] Kitchen ([id number]) -------------------------------------------------------------------------------- Pain Assessment Details Patient Name: Marcia [name] Date of Service: 07/07/2018 9:30 AM Medical Record Number: [id number] Patient Account Number: [id number] Date of Birth/Sex: July 25, 1946 (72 y.o. F) Treating RN: Curtis Sites Primary Care Mckaila Duffus: Sherrie Mustache Other Clinician: Referring Zarya Lasseigne: Sherrie Mustache Treating Dortha Neighbors/Extender: Linwood Dibbles, HOYT Weeks in Treatment: 8 Active Problems Location of Pain Severity and Description of Pain Patient Has Paino Yes Site Locations Duration of the Pain. Constant / Intermittento Intermittent Rate the pain. Current Pain Level: 1 Worst Pain Level: 6 Least Pain Level: 0 Pain Management and Medication Current Pain Management: Electronic Signature(s) Signed: 07/07/2018 3:15:29 PM By: Dayton Martes RCP, RRT, CHT Signed: 07/07/2018 5:00:45 PM By: Curtis Sites Entered By: Dayton Martes on 07/07/2018 09:33:30 Gamarra, [name] ([id number]) -------------------------------------------------------------------------------- Patient/Caregiver Education Details Patient Name: Marcia [name] Date of Service: 07/07/2018 9:30 AM Medical Record  Number: [id number] Patient Account Number: [id number] Date of Birth/Gender: 1945/12/11 (72 y.o. F) Treating RN: Curtis Sites Primary Care Physician: Sherrie Mustache Other Clinician: Referring Physician: Sherrie Mustache Treating Physician/Extender: Skeet Simmer in Treatment: 8 Education Assessment Education Provided To: Patient Education Topics Provided Medication Safety: Handouts: Other: complete antibiotics as prescribed Methods: Explain/Verbal Responses: State content correctly Wound/Skin Impairment: Handouts: Other: wound care as ordered Methods: Demonstration, Explain/Verbal Responses: State content correctly Electronic Signature(s) Signed: 07/07/2018 4:16:12 PM By: Renne Crigler Entered By: Renne Crigler on 07/07/2018 10:20:30 [name], [name] ([id number]) -------------------------------------------------------------------------------- Wound Assessment Details Patient Name: Marcia [name] Date of Service: 07/07/2018 9:30 AM Medical Record Number: [id number] Patient Account Number: [id number] Date of Birth/Sex: 08/23/1946 (72 y.o. F) Treating RN: Huel Coventry Primary Care Newell Wafer: Sherrie Mustache Other Clinician: Referring Stonewall Doss: Sherrie Mustache Treating Ayza Ripoll/Extender: Linwood Dibbles, HOYT Weeks in Treatment: 8 Wound Status Wound Number: 3 Primary Etiology: Vasculitis Wound Location: Right Lower Leg - Posterior Secondary Diabetic Wound/Ulcer of the Lower Etiology: Extremity Wounding Event: Gradually Appeared Wound Status: Open Date Acquired: 03/12/2018 Comorbid History: Hypertension, Type II Diabetes, Weeks Of Treatment: 8 Neuropathy Clustered Wound: No Photos Photo Uploaded By: Elliot Gurney, BSN, RN, CWS, Kim on 07/07/2018 16:23:17 Wound Measurements Length: (cm) 1.5 Width: (cm) 1.1 Depth: (cm) 0.2 Area: (cm) 1.296 Volume: (cm) 0.259 % Reduction in Area: -22.3% % Reduction in Volume: -144.3% Epithelialization: None Tunneling: No Undermining: No Wound  Description Full Thickness Without Exposed Support Classification: Structures Wound Margin: Flat and Intact Exudate Medium Amount: Exudate Type: Serous Exudate Color: amber Foul Odor After Cleansing: No Slough/Fibrino Yes Wound Bed Granulation Amount: Medium (34-66%) Exposed Structure Granulation Quality: Pink, Pale Fascia Exposed: No Necrotic Amount: Medium (34-66%) Fat Layer (Subcutaneous Tissue) Exposed: Yes Necrotic Quality: Adherent Slough Tendon Exposed: No Muscle Exposed: No Joint Exposed: No Bone Exposed: No [name], [name] ([id number])  Periwound Skin Texture Texture Color No Abnormalities Noted: No No Abnormalities Noted: No Moisture No Abnormalities Noted: No Dry / Scaly: Yes Wound Preparation Ulcer Cleansing: Rinsed/Irrigated with Saline Topical Anesthetic Applied: Other: lidocaine 4%, Treatment Notes Wound #3 (Right, Posterior Lower Leg) 1. Cleansed with: Clean wound with Normal Saline 2. Anesthetic Topical Lidocaine 4% cream to wound bed prior to debridement 3. Peri-wound Care: Moisturizing lotion Other peri-wound care (specify in notes) 4. Dressing Applied: Other dressing (specify in notes) 5. Secondary Dressing Applied ABD Pad Dry Gauze Notes TCA cream on red areas, silvercell conform tape and cotton sleeve Electronic Signature(s) Signed: 07/07/2018 4:33:57 PM By: Elliot Gurney, BSN, RN, CWS, Kim RN, BSN Entered By: Elliot Gurney, BSN, RN, CWS, Kim on 07/07/2018 09:40:07 [name], [name] ([id number]) -------------------------------------------------------------------------------- Wound Assessment Details Patient Name: Marcia [name] Date of Service: 07/07/2018 9:30 AM Medical Record Number: [id number] Patient Account Number: [id number] Date of Birth/Sex: 16-May-1946 (72 y.o. F) Treating RN: Huel Coventry Primary Care Yakelin Grenier: Sherrie Mustache Other Clinician: Referring Akhila Mahnken: Sherrie Mustache Treating Odelia Graciano/Extender: Linwood Dibbles, HOYT Weeks in Treatment: 8 Wound  Status Wound Number: 4 Primary Etiology: Vasculitis Wound Location: Right Malleolus - Medial Secondary Diabetic Wound/Ulcer of the Lower Etiology: Extremity Wounding Event: Gradually Appeared Wound Status: Open Date Acquired: 03/12/2018 Comorbid History: Hypertension, Type II Diabetes, Weeks Of Treatment: 8 Neuropathy Clustered Wound: Yes Photos Photo Uploaded By: Elliot Gurney, BSN, RN, CWS, Kim on 07/07/2018 16:23:50 Wound Measurements Length: (cm) 0.8 Width: (cm) 1 Depth: (cm) 0.1 Area: (cm) 0.628 Volume: (cm) 0.063 % Reduction in Area: 96.5% % Reduction in Volume: 98.3% Epithelialization: Small (1-33%) Tunneling: No Undermining: No Wound Description Full Thickness Without Exposed Support Classification: Structures Wound Margin: Flat and Intact Exudate Medium Amount: Exudate Type: Serous Exudate Color: amber Foul Odor After Cleansing: No Slough/Fibrino Yes Wound Bed Granulation Amount: Medium (34-66%) Exposed Structure Granulation Quality: Red Fascia Exposed: No Necrotic Amount: Medium (34-66%) Fat Layer (Subcutaneous Tissue) Exposed: Yes Necrotic Quality: Adherent Slough Tendon Exposed: No Muscle Exposed: No Joint Exposed: No Bone Exposed: No [name], [name] ([id number]) Periwound Skin Texture Texture Color No Abnormalities Noted: No No Abnormalities Noted: No Callus: No Atrophie Blanche: No Crepitus: No Cyanosis: No Excoriation: No Ecchymosis: No Induration: No Erythema: No Rash: No Hemosiderin Staining: No Scarring: No Mottled: No Pallor: No Moisture Rubor: Yes No Abnormalities Noted: No Dry / Scaly: Yes Maceration: No Wound Preparation Ulcer Cleansing: Rinsed/Irrigated with Saline Topical Anesthetic Applied: Other: lidocaine 4%, Treatment Notes Wound #4 (Right, Medial Malleolus) 1. Cleansed with: Clean wound with Normal Saline 2. Anesthetic Topical Lidocaine 4% cream to wound bed prior to debridement 3. Peri-wound Care: Moisturizing  lotion Other peri-wound care (specify in notes) 4. Dressing Applied: Other dressing (specify in notes) 5. Secondary Dressing Applied ABD Pad Dry Gauze Notes TCA cream on red areas, silvercell conform tape and cotton sleeve Electronic Signature(s) Signed: 07/07/2018 4:33:57 PM By: Elliot Gurney, BSN, RN, CWS, Kim RN, BSN Entered By: Elliot Gurney, BSN, RN, CWS, Kim on 07/07/2018 09:40:56 Uhrich, [name] ([id number]) -------------------------------------------------------------------------------- Wound Assessment Details Patient Name: Marcia [name] Date of Service: 07/07/2018 9:30 AM Medical Record Number: [id number] Patient Account Number: [id number] Date of Birth/Sex: 11/06/1945 (72 y.o. F) Treating RN: Huel Coventry Primary Care Taleshia Luff: Sherrie Mustache Other Clinician: Referring Aren Pryde: Sherrie Mustache Treating Geraline Halberstadt/Extender: Linwood Dibbles, HOYT Weeks in Treatment: 8 Wound Status Wound Number: 5 Primary Etiology: Diabetic Wound/Ulcer of the Lower Extremity Wound Location: Right Lower Leg - Lateral, Distal Wound Status: Open Wounding Event: Gradually Appeared Comorbid Hypertension, Type II Diabetes,  Date Acquired: 06/28/2018 History: Neuropathy Weeks Of Treatment: 1 Clustered Wound: Yes Photos Photo Uploaded By: Elliot Gurney, BSN, RN, CWS, Kim on 07/07/2018 16:23:51 Wound Measurements Length: (cm) 1.8 % Reduc Width: (cm) 1.4 % Reduc Depth: (cm) 0.1 Epithel Clustered Quantity: 4 Tunneli Area: (cm) 1.979 Underm Volume: (cm) 0.198 tion in Area: 89.9% tion in Volume: 89.9% ialization: Medium (34-66%) ng: No ining: No Wound Description Classification: Grade 1 Wound Margin: Indistinct, nonvisible Exudate Amount: Medium Exudate Type: Serous Exudate Color: amber Foul Odor After Cleansing: No Slough/Fibrino No Wound Bed Granulation Amount: Medium (34-66%) Exposed Structure Granulation Quality: Pink Fascia Exposed: No Necrotic Amount: Medium (34-66%) Fat Layer (Subcutaneous Tissue)  Exposed: Yes Necrotic Quality: Adherent Slough Tendon Exposed: No Muscle Exposed: No Joint Exposed: No Bone Exposed: No Periwound Skin Texture [name], [name] ([id number]) Texture Color No Abnormalities Noted: No No Abnormalities Noted: No Callus: No Atrophie Blanche: No Crepitus: No Cyanosis: No Excoriation: No Ecchymosis: No Induration: No Erythema: No Rash: No Hemosiderin Staining: No Scarring: No Mottled: No Pallor: No Moisture Rubor: Yes No Abnormalities Noted: No Dry / Scaly: Yes Temperature / Pain Maceration: No Temperature: No Abnormality Wound Preparation Ulcer Cleansing: Rinsed/Irrigated with Saline Topical Anesthetic Applied: Other: lidocaine 4%, Treatment Notes Wound #5 (Right, Distal, Lateral Lower Leg) 1. Cleansed with: Clean wound with Normal Saline 2. Anesthetic Topical Lidocaine 4% cream to wound bed prior to debridement 3. Peri-wound Care: Moisturizing lotion Other peri-wound care (specify in notes) 4. Dressing Applied: Other dressing (specify in notes) 5. Secondary Dressing Applied ABD Pad Dry Gauze Notes TCA cream on red areas, silvercell conform tape and cotton sleeve Electronic Signature(s) Signed: 07/07/2018 4:33:57 PM By: Elliot Gurney, BSN, RN, CWS, Kim RN, BSN Entered By: Elliot Gurney, BSN, RN, CWS, Kim on 07/07/2018 09:41:34 Lahm, [name] ([id number]) -------------------------------------------------------------------------------- Vitals Details Patient Name: Marcia [name] Date of Service: 07/07/2018 9:30 AM Medical Record Number: [id number] Patient Account Number: [id number] Date of Birth/Sex: [birth year] (72 y.o. F) Treating RN: Curtis Sites Primary Care Aren Pryde: Sherrie Mustache Other Clinician: Referring English Tomer: Sherrie Mustache Treating Amer Alcindor/Extender: Linwood Dibbles, HOYT Weeks in Treatment: 8 Vital Signs Time Taken: 09:32 Temperature (F): 97.9 Height (in): 70 Pulse (bpm): 70 Weight (lbs): 193 Respiratory Rate (breaths/min):  16 Body Mass Index (BMI): 27.7 Blood Pressure (mmHg): 153/62 Reference Range: 80 - 120 mg / dl Electronic Signature(s) Signed: 07/07/2018 3:15:29 PM By: Dayton Martes RCP, RRT, CHT Entered By: Dayton Martes on 07/07/2018 09:36:02

## 2018-07-11 NOTE — Progress Notes (Signed)
Marcia, [name] ([id number]) Visit Report for 07/07/2018 Chief Complaint Document Details Patient Name: [name], [name] A. Date of Service: 07/07/2018 9:30 AM Medical Record Number: [id number] Patient Account Number: [id number] Date of Birth/Sex: 04/04/1946 (72 y.o. F) Treating RN: Curtis Sites Primary Care Provider: Sherrie Mustache Other Clinician: Referring Provider: Sherrie Mustache Treating Provider/Extender: Skeet Simmer in Treatment: 8 Information Obtained from: Patient Chief Complaint RLE wounds Electronic Signature(s) Signed: 07/08/2018 8:03:16 PM By: Lenda Kelp PA-C Entered By: Lenda Kelp on 07/07/2018 09:32:59 [name], [name] ([id number]) -------------------------------------------------------------------------------- Debridement Details Patient Name: Marcia [name] Date of Service: 07/07/2018 9:30 AM Medical Record Number: [id number] Patient Account Number: [id number] Date of Birth/Sex: [birth year] (72 y.o. F) Treating RN: Curtis Sites Primary Care Provider: Sherrie Mustache Other Clinician: Referring Provider: Sherrie Mustache Treating Provider/Extender: Linwood Dibbles, HOYT Weeks in Treatment: 8 Debridement Performed for Wound #5 Right,Distal,Lateral Lower Leg Assessment: Performed By: Physician STONE III, HOYT E., PA-C Debridement Type: Debridement Severity of Tissue Pre Fat layer exposed Debridement: Level of Consciousness (Pre- Awake and Alert procedure): Pre-procedure Verification/Time Yes - 10:01 Out Taken: Start Time: 10:01 Pain Control: Lidocaine 4% Topical Solution Total Area Debrided (L x W): 1.8 (cm) x 1.4 (cm) = 2.52 (cm) Tissue and other material Viable, Non-Viable, Slough, Subcutaneous, Slough debrided: Level: Skin/Subcutaneous Tissue Debridement Description: Excisional Instrument: Curette Bleeding: Minimum Hemostasis Achieved: Pressure End Time: 10:04 Procedural Pain: 0 Post Procedural Pain: 0 Response to Treatment: Procedure was  tolerated well Level of Consciousness Awake and Alert (Post-procedure): Post Debridement Measurements of Total Wound Length: (cm) 1.8 Width: (cm) 1.4 Depth: (cm) 0.2 Volume: (cm) 0.396 Character of Wound/Ulcer Post Debridement: Improved Severity of Tissue Post Debridement: Fat layer exposed Post Procedure Diagnosis Same as Pre-procedure Electronic Signature(s) Signed: 07/07/2018 5:00:45 PM By: Curtis Sites Signed: 07/08/2018 8:03:16 PM By: Lenda Kelp PA-C Entered By: Curtis Sites on 07/07/2018 10:04:26 [name], [name] ([id number]) -------------------------------------------------------------------------------- Debridement Details Patient Name: Marcia [name] Date of Service: 07/07/2018 9:30 AM Medical Record Number: [id number] Patient Account Number: [id number] Date of Birth/Sex: 10-30-1945 (72 y.o. F) Treating RN: Curtis Sites Primary Care Provider: Sherrie Mustache Other Clinician: Referring Provider: Sherrie Mustache Treating Provider/Extender: Linwood Dibbles, HOYT Weeks in Treatment: 8 Debridement Performed for Wound #3 Right,Posterior Lower Leg Assessment: Performed By: Physician STONE III, HOYT E., PA-C Debridement Type: Debridement Severity of Tissue Pre Fat layer exposed Debridement: Level of Consciousness (Pre- Awake and Alert procedure): Pre-procedure Verification/Time Yes - 10:04 Out Taken: Start Time: 10:04 Pain Control: Lidocaine 4% Topical Solution Total Area Debrided (L x W): 1.5 (cm) x 1.1 (cm) = 1.65 (cm) Tissue and other material Viable, Non-Viable, Slough, Subcutaneous, Slough debrided: Level: Skin/Subcutaneous Tissue Debridement Description: Excisional Instrument: Curette Bleeding: Minimum Hemostasis Achieved: Pressure End Time: 10:06 Procedural Pain: 0 Post Procedural Pain: 0 Response to Treatment: Procedure was tolerated well Level of Consciousness Awake and Alert (Post-procedure): Post Debridement Measurements of Total Wound Length:  (cm) 1.5 Width: (cm) 1.1 Depth: (cm) 0.2 Volume: (cm) 0.259 Character of Wound/Ulcer Post Debridement: Improved Severity of Tissue Post Debridement: Fat layer exposed Post Procedure Diagnosis Same as Pre-procedure Electronic Signature(s) Signed: 07/07/2018 5:00:45 PM By: Curtis Sites Signed: 07/08/2018 8:03:16 PM By: Lenda Kelp PA-C Entered By: Curtis Sites on 07/07/2018 10:05:34 [name], [name] ([id number]) -------------------------------------------------------------------------------- HPI Details Patient Name: Marcia [name] Date of Service: 07/07/2018 9:30 AM Medical Record Number: [id number] Patient Account Number: [id number] Date of Birth/Sex: 1946-03-20 (72 y.o. F) Treating RN: Curtis Sites Primary Care Provider: Sherrie Mustache Other  Clinician: Referring Provider: Sherrie Mustache Treating Provider/Extender: Linwood Dibbles, HOYT Weeks in Treatment: 8 History of Present Illness HPI Description: 05/12/18-She is seen in initial evaluation for multiple open areas to the right lower extremity. She was recently hospitalized (7/30-8/2) with cellulitis of the right leg, failed outpatient therapy with Keflex. While hospitalized she was treated with IV vancomycin and cefepime; right foot x-ray was negative for osteomyelitis. She admits to intermittent pain, improved since hospitalization. She was discharged on doxycycline which she should complete with the next 48 hours. 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 She has a remote history of following with vascular medicine for venous reflux, she does not tolerate wearing compression stockings d/t skin sensitivities and "burning" sensation. In office ABI are elevated but she has strongly palpable pulses. We will initiate hydrofera blue and ace wrap compression. She is a diabetic with A1c 6.9 while hospitalized. 05/19/18- She is seen in follow up  evaluation for RLE wounds; the lateral area is healed. She did tolerate ace wrap for compression, but experienced increased xerosis. We will hold off on ace wrap and increase lachydrin to twice daily. She c/o pain to the medial cluster of wounds, there is no apparent infection and will initiate steroid cream. She will follow up next week 05/26/18 on evaluation today patient appears to actually be having some issues currently with pain. She has been utilizing the dressings as previously recommended for the lower extremities. With that being said she does appear to have a little bit of a rash she states that anytime it he's in for anything else touches her skin that she will often develop a rash. She's not even sure that should be able to tolerate a compression wrap due to the fact that again she would be concerned about even the cotton layer being in contact with her skin which overtime will cause a burning sensation. She has seen dermatology concerning this unfortunately they really did not have a good answer for her as to why this is occurring. Fortunately there is no evidence of infection at this time. 05/29/18 on evaluation today patient was actually supposed to be back in the office in order to have a dressings/wrap change is a nurse visit. With that being said she was having significant discomfort with want to touch around the wound and. It green discharge. Subsequently the concern was that she had an infection so we did convert her to a physician visit instead of just a nurse visit. Subsequently upon evaluation she does seem to have purulent discharge along with evidence of cellulitis extending up the leg which is definitely worse than when I saw her earlier in the week. Fortunately there does not appear to be any evidence of systemic infection at this point. No fevers, chills, nausea, or vomiting noted at this time. 06/02/18 on evaluation today the patient appears to be doing better in regard to  the overall appearance in regard to the wound bed. She still does have slough covering the surface of the wounds and due to pain really I do not think sharp debridement is going to be advisable at this point. Nonetheless I do feel like that she is definitely showing signs of improvement compared to her previous evaluation. Her pain level is still rather high she tells me at times 8-9/10. Unfortunately she is having a difficult time getting into pain management she states that the one that we referred  her to stated it would be at least two weeks before they can get her in. 06/12/18 on evaluation today patient actually appears to be doing much better in regard to her lower extremity wounds. In fact there does not appear to be any evidence of infection at this time I do believe the Cipro has been effective her pain is also greatly improved compared to previous. Nonetheless she does have some Slough noted that is gonna require debridement. Patient's MRI was reviewed and revealed that she had no evidence of osteomyelitis, septic arthritis, or absence. She did have blisters noted on the surface of the foot which again visually we see today as well. 06/16/18 on evaluation today patient actually appears to be doing fairly well in regard to her lower extremity ulcerations. With that being said she does have issues at this point with discomfort although she did see the pain management physician this morning he did give her medication to help in this regard. She tells me that after I debrided her wound last week that she actually had significant pain to the point that she ended up having a difficult time even driving to get home. Obviously when she left the clinic she was not in that much pain this somewhat surprises me especially since we put lidocaine on her wounds prior to applying the dressings and the dressing we were using was Prisma which is not notorious for causing discomfort and Therriault, Tanai A.  ([id number]) burning. Nonetheless she did obviously experience this and she tells me that she set intermittent spells where this occurred in the week since I last saw her. Nonetheless she has been given the pain medications necessary in order to keep things under control at this point. Obviously this is short term but nonetheless I think will be helpful in getting the wounds to heal so that we can appropriately debrided do what we need to do. 06/23/18 on evaluation today patient actually appears to be doing significantly better in regard to her right lower extremity ulcerations. In fact she has made dramatic improvement pretty much all locations which is great news. Fortunately there's no evidence of infection at this time. Her irritation and inflammation also is better than I do believe that the treatment utilizing the Santyl has been beneficial over the past week. I'm not even sure that we need that any further. 06/30/18 on evaluation today patient actually is doing worse compared to her previous evaluation. Unfortunately she's been having increased drainage, pain, and overall I feel like this is likely due to infection based on the appearance of her wound today. In general the patient seems to have digressed although not really back to where she started although definitely not as well she's been doing in the past couple of weeks. She's not having green drainage like she did previous but she is having a lot of drainage to the point that she's actually dripping from the posterior portion of her leg at this time. 07/07/18 on evaluation today patient actually appears to be doing rather well in regard to her right lower extremity ulcers. They also do measuring smaller which is good news she has a lot of dry and cracked skin which is gonna make her more prone to reinfection we need to work on this. Nonetheless I do believe that in general the patient has been doing well overall with her current dressing  changes and I do believe the doxycycline was beneficial she did have a Staphylococcus aureus infection fortunately this was  not MRSA. The doxycycline also is appropriate for this infection. Electronic Signature(s) Signed: 07/08/2018 8:03:16 PM By: Lenda Kelp PA-C Entered By: Lenda Kelp on 07/07/2018 10:12:30 [name], [name] ([id number]) -------------------------------------------------------------------------------- Physical Exam Details Patient Name: Marcia [name] Date of Service: 07/07/2018 9:30 AM Medical Record Number: [id number] Patient Account Number: [id number] Date of Birth/Sex: 1946-08-31 (72 y.o. F) Treating RN: Curtis Sites Primary Care Provider: Sherrie Mustache Other Clinician: Referring Provider: Sherrie Mustache Treating Provider/Extender: Linwood Dibbles, HOYT Weeks in Treatment: 8 Constitutional Well-nourished and well-hydrated in no acute distress. Respiratory normal breathing without difficulty. Psychiatric this patient is able to make decisions and demonstrates good insight into disease process. Alert and Oriented x 3. pleasant and cooperative. Notes At this point patient's wound beds actually show signs of great improvement in general I feel like she's making good progress and I do think she shown signs of overall progress. Nonetheless I do think we need to work on the overall appearance of her skin which is very dry, erythematous, and cracked which is gonna make her more prone to further breakdown. I did perform sharp debridement over the three wound areas in order to clean away the necrotic tissue she tolerated this without significant pain in the past she was hurting much more I'm glad that's at least doing better I am appreciative of her pain management provider helping with that aspect of her care. Electronic Signature(s) Signed: 07/08/2018 8:03:16 PM By: Lenda Kelp PA-C Entered By: Lenda Kelp on 07/07/2018 10:13:35 [name], [name]  ([id number]) -------------------------------------------------------------------------------- Physician Orders Details Patient Name: Marcia [name] Date of Service: 07/07/2018 9:30 AM Medical Record Number: [id number] Patient Account Number: [id number] Date of Birth/Sex: 1945/12/28 (72 y.o. 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 Wound #4 Right,Medial Malleolus o May Shower, gently pat wound dry prior to applying new dressing. Wound #5 Right,Distal,Lateral Lower Leg o May Shower, gently pat wound dry prior to applying new dressing. Skin Barriers/Peri-Wound Care Wound #3 Right,Posterior Lower Leg o Moisturizing lotion - mixed with TCA o Triamcinolone Acetonide Ointment (TCA) - mixed with lotion Wound #4 Right,Medial Malleolus o Moisturizing lotion - mixed with TCA o Triamcinolone Acetonide Ointment (TCA) - mixed with lotion Wound #5 Right,Distal,Lateral Lower Leg o Moisturizing lotion - mixed with TCA o Triamcinolone Acetonide Ointment (TCA) - mixed with lotion Primary Wound Dressing Wound #3 Right,Posterior Lower Leg o Silver Alginate Wound #4 Right,Medial Malleolus o Silver Alginate Wound #5 Right,Distal,Lateral Lower Leg o Silver Alginate Secondary Dressing Wound #3 Right,Posterior Lower Leg o ABD and Kerlix/Conform [name], [name] ([id number]) Wound #4 Right,Medial Malleolus o ABD and  Kerlix/Conform Wound #5 Right,Distal,Lateral Lower Leg o ABD and Kerlix/Conform Dressing Change Frequency Wound #3 Right,Posterior Lower Leg o Change dressing every day. Wound #4 Right,Medial Malleolus o Change dressing every day. Wound #5 Right,Distal,Lateral Lower Leg o Change dressing every day. Follow-up Appointments Wound #3 Right,Posterior Lower Leg o Return Appointment in 2 weeks. Wound #4 Right,Medial Malleolus o Return Appointment in 2 weeks. Wound #5 Right,Distal,Lateral Lower Leg   o Return Appointment in 2 weeks. Edema Control Wound #3 Right,Posterior Lower Leg o Elevate legs to the level of the heart and pump ankles as often as possible Wound #4 Right,Medial Malleolus o Elevate legs to the level of the heart and pump ankles as often as possible Wound #5 Right,Distal,Lateral Lower Leg o Elevate legs to the level of the heart and pump ankles as often as possible Patient Medications Allergies: No Known Allergies Notifications Medication Indication Start End triamcinolone acetonide 07/07/2018 DOSE topical 0.1 % cream - cream topical mixed 1 to 1 with Eucerin cream and then applied to the right LE where the skin is dry, red, and cracked. Apply daily Electronic Signature(s) Signed: 07/07/2018 10:15:58 AM By: Lenda Kelp PA-C Entered By: Lenda Kelp on 07/07/2018 10:15:58 [place], [name] ([id number]) -------------------------------------------------------------------------------- Problem List Details Patient Name: Marcia [name] Date of Service: 07/07/2018 9:30 AM Medical Record Number: [id number] Patient Account Number: [id number] Date of Birth/Sex: 01-06-1946 (72 y.o. 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 Date of Service: 07/07/2018 9:30 AM Medical Record Number: [id number] Patient Account Number: [id number] Date of Birth/Sex: Mar 31, 1946 (72 y.o. F) Treating RN: Curtis Sites Primary Care Provider: Sherrie Mustache Other Clinician: Referring Provider: Sherrie Mustache Treating Provider/Extender: Linwood Dibbles, HOYT Weeks in Treatment: 8 Subjective Chief Complaint Information obtained from Patient RLE wounds History of Present Illness (HPI) 05/12/18-She is seen in initial evaluation for multiple open areas to the right lower extremity. She was recently hospitalized (7/30-8/2) with cellulitis of the right leg, failed outpatient therapy with Keflex. While hospitalized she was treated with IV vancomycin and cefepime; right foot x-ray was negative for osteomyelitis. She admits to intermittent pain, improved since hospitalization. She was discharged on doxycycline which she should complete with the next 48 hours. 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 She has a remote history of following with vascular medicine for venous reflux, she does not tolerate wearing compression stockings d/t skin sensitivities and "burning" sensation. In office ABI are elevated but she has strongly palpable pulses.  We will initiate hydrofera blue and ace wrap compression. She is a diabetic with A1c 6.9 while hospitalized. 05/19/18- She is seen in follow up evaluation for RLE wounds; the lateral area is healed. She did tolerate ace wrap for compression, but experienced increased xerosis. We will hold off on ace wrap and increase lachydrin to twice daily. She c/o pain to the medial cluster of wounds, there is no apparent infection and will initiate steroid cream. She will follow up next week 05/26/18 on evaluation today patient appears to actually be having some issues currently with pain. She has been utilizing the dressings as previously recommended for the lower extremities. With that being said she does appear to have a little bit of  a rash she states that anytime it he's in for anything else touches her skin that she will often develop a rash. She's not even sure that should be able to tolerate a compression wrap due to the fact that again she would be concerned about even the cotton layer being in contact with her skin which overtime will cause a burning sensation. She has seen dermatology concerning this unfortunately they really did not have a good answer for her as to why this is occurring. Fortunately there is no evidence of infection at this time. 05/29/18 on evaluation today patient was actually supposed to be back in the office in order to have a dressings/wrap change is a nurse visit. With that being said she was having significant discomfort with want to touch around the wound and. It green discharge. Subsequently the concern was that she had an infection so we did convert her to a physician visit instead of just a nurse visit. Subsequently upon evaluation she does seem to have purulent discharge along with evidence of cellulitis extending up the leg which is definitely worse than when I saw her earlier in the week. Fortunately there does not appear to be any evidence of systemic infection at this  point. No fevers, chills, nausea, or vomiting noted at this time. 06/02/18 on evaluation today the patient appears to be doing better in regard to the overall appearance in regard to the wound bed. She still does have slough covering the surface of the wounds and due to pain really I do not think sharp debridement is going to be advisable at this point. Nonetheless I do feel like that she is definitely showing signs of improvement compared to her previous evaluation. Her pain level is still rather high she tells me at times 8-9/10. Unfortunately she is having a difficult time getting into pain management she states that the one that we referred her to stated it would be at least two weeks before they can get her in. 06/12/18 on evaluation today patient actually appears to be doing much better in regard to her lower extremity wounds. In fact there does not appear to be any evidence of infection at this time I do believe the Cipro has been effective her pain is also greatly improved compared to previous. Nonetheless she does have some Slough noted that is gonna require debridement. Patient's MRI was reviewed and revealed that she had no evidence of osteomyelitis, septic arthritis, or absence. She did have blisters noted on the surface of the foot which again visually we see today as well. [name], [name] ([id number]) 06/16/18 on evaluation today patient actually appears to be doing fairly well in regard to her lower extremity ulcerations. With that being said she does have issues at this point with discomfort although she did see the pain management physician this morning he did give her medication to help in this regard. She tells me that after I debrided her wound last week that she actually had significant pain to the point that she ended up having a difficult time even driving to get home. Obviously when she left the clinic she was not in that much pain this somewhat surprises me especially since we  put lidocaine on her wounds prior to applying the dressings and the dressing we were using was Prisma which is not notorious for causing discomfort and burning. Nonetheless she did obviously experience this and she tells me that she set intermittent spells where this occurred in the week since  I last saw her. Nonetheless she has been given the pain medications necessary in order to keep things under control at this point. Obviously this is short term but nonetheless I think will be helpful in getting the wounds to heal so that we can appropriately debrided do what we need to do. 06/23/18 on evaluation today patient actually appears to be doing significantly better in regard to her right lower extremity ulcerations. In fact she has made dramatic improvement pretty much all locations which is great news. Fortunately there's no evidence of infection at this time. Her irritation and inflammation also is better than I do believe that the treatment utilizing the Santyl has been beneficial over the past week. I'm not even sure that we need that any further. 06/30/18 on evaluation today patient actually is doing worse compared to her previous evaluation. Unfortunately she's been having increased drainage, pain, and overall I feel like this is likely due to infection based on the appearance of her wound today. In general the patient seems to have digressed although not really back to where she started although definitely not as well she's been doing in the past couple of weeks. She's not having green drainage like she did previous but she is having a lot of drainage to the point that she's actually dripping from the posterior portion of her leg at this time. 07/07/18 on evaluation today patient actually appears to be doing rather well in regard to her right lower extremity ulcers. They also do measuring smaller which is good news she has a lot of dry and cracked skin which is gonna make her more prone  to reinfection we need to work on this. Nonetheless I do believe that in general the patient has been doing well overall with her current dressing changes and I do believe the doxycycline was beneficial she did have a Staphylococcus aureus infection fortunately this was not MRSA. The doxycycline also is appropriate for this infection. Patient History Information obtained from Patient. Family History Cancer - Siblings, Diabetes - Mother,Father,Siblings, Heart Disease - Father, Hypertension - Father, Stroke - Father,Mother, No family history of Kidney Disease, Lung Disease, Seizures, Thyroid Problems, Tuberculosis. Social History Former smoker - 10-15 years quit, Marital Status - Married, Alcohol Use - Never, Drug Use - No History, Caffeine Use - Daily. 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 Respiratory The patient has no complaints or symptoms. Cardiovascular Complains or has symptoms of LE edema. Psychiatric The patient has no complaints or symptoms. [name], [name] ([id number]) Objective Constitutional Well-nourished and well-hydrated in no acute distress. Vitals Time Taken: 9:32 AM, Height: 70 in, Weight: 193 lbs, BMI: 27.7, Temperature: 97.9 F, Pulse: 70 bpm, Respiratory Rate: 16 breaths/min, Blood Pressure: 153/62 mmHg. Respiratory normal breathing without difficulty. Psychiatric this patient is able to make decisions and demonstrates good insight into disease process. Alert and Oriented x 3. pleasant and cooperative. General Notes: At this point patient's wound beds actually show signs of great improvement in general I feel like she's making good progress and I do think she shown  signs of overall progress. Nonetheless I do think we need to work on the overall appearance of her skin which is very dry, erythematous, and cracked which is gonna make her more prone to further breakdown. I did  perform sharp debridement over the three wound areas in order to clean away the necrotic tissue she tolerated this without significant pain in the past she was hurting much more I'm glad that's at least doing better I am appreciative of her pain management provider helping with that aspect of her care. Integumentary (Hair, Skin) Wound #3 status is Open. Original cause of wound was Gradually Appeared. The wound is located on the Right,Posterior Lower Leg. The wound measures 1.5cm length x 1.1cm width x 0.2cm depth; 1.296cm^2 area and 0.259cm^3 volume. There is Fat Layer (Subcutaneous Tissue) Exposed exposed. There is no tunneling or undermining noted. There is a medium amount of serous drainage noted. The wound margin is flat and intact. There is medium (34-66%) pink, pale granulation within the wound bed. There is a medium (34-66%) amount of necrotic tissue within the wound bed including Adherent Slough. The periwound skin appearance exhibited: Dry/Scaly. Wound #4 status is Open. Original cause of wound was Gradually Appeared. The wound is located on the Right,Medial Malleolus. The wound measures 0.8cm length x 1cm width x 0.1cm depth; 0.628cm^2 area and 0.063cm^3 volume. There is Fat Layer (Subcutaneous Tissue) Exposed exposed. There is no tunneling or undermining noted. There is a medium amount of serous drainage noted. The wound margin is flat and intact. There is medium (34-66%) red granulation within the wound bed. There is a medium (34-66%) amount of necrotic tissue within the wound bed including Adherent Slough. The periwound skin appearance exhibited: Dry/Scaly, Rubor. The periwound skin appearance did not exhibit: Callus, Crepitus, Excoriation, Induration, Rash, Scarring,  Maceration, Atrophie Blanche, Cyanosis, Ecchymosis, Hemosiderin Staining, Mottled, Pallor, Erythema. Wound #5 status is Open. Original cause of wound was Gradually Appeared. The wound is located on the Right,Distal,Lateral Lower Leg. The wound measures 1.8cm length x 1.4cm width x 0.1cm depth; 1.979cm^2 area and 0.198cm^3 volume. There is Fat Layer (Subcutaneous Tissue) Exposed exposed. There is no tunneling or undermining noted. There is a medium amount of serous drainage noted. The wound margin is indistinct and nonvisible. There is medium (34-66%) pink granulation within the wound bed. There is a medium (34-66%) amount of necrotic tissue within the wound bed including Adherent Slough. The periwound skin appearance exhibited: Dry/Scaly, Rubor. The periwound skin appearance did not exhibit: Callus, Crepitus, Excoriation, Induration, Rash, Scarring, Maceration, Atrophie Blanche, Cyanosis, Ecchymosis, Hemosiderin Staining, Mottled, Pallor, Erythema. Periwound temperature was noted as No Abnormality. 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 There was a Excisional Skin/Subcutaneous Tissue Debridement with a total area of 1.65 sq cm performed by STONE III, HOYT E., PA-C. With the following instrument(s): Curette to remove Viable and Non-Viable tissue/material. Material removed includes Subcutaneous Tissue and Slough and after achieving pain control using Lidocaine 4% Topical Solution. No specimens were taken. A time out was conducted at 10:04, prior to the start of the procedure. A Minimum amount of bleeding was  controlled with Pressure. The procedure was tolerated well with a pain level of 0 throughout and a pain level of 0 following the procedure. Post Debridement Measurements: 1.5cm length x 1.1cm width x 0.2cm depth; 0.259cm^3 volume. Character of Wound/Ulcer Post Debridement is improved. Severity of Tissue Post Debridement is:  Fat layer exposed. Post procedure Diagnosis Wound #3: Same as Pre-Procedure Wound #5 Pre-procedure diagnosis of Wound #5 is a Diabetic Wound/Ulcer of the Lower Extremity located on the Right,Distal,Lateral Lower Leg .Severity of Tissue Pre Debridement is: Fat layer exposed. There was a Excisional Skin/Subcutaneous Tissue Debridement with a total area of 2.52 sq cm performed by STONE III, HOYT E., PA-C. With the following instrument(s): Curette to remove Viable and Non-Viable tissue/material. Material removed includes Subcutaneous Tissue and Slough and after achieving pain control using Lidocaine 4% Topical Solution. No specimens were taken. A time out was conducted at 10:01, prior to the start of the procedure. A Minimum amount of bleeding was controlled with Pressure. The procedure was tolerated well with a pain level of 0 throughout and a pain level of 0 following the procedure. Post Debridement Measurements: 1.8cm length x 1.4cm width x 0.2cm depth; 0.396cm^3 volume. Character of Wound/Ulcer Post Debridement is improved. Severity of Tissue Post Debridement is: Fat layer exposed. Post procedure Diagnosis Wound #5: Same as Pre-Procedure Plan Wound Cleansing: Wound #3 Right,Posterior Lower Leg: May Shower, gently pat wound dry prior to applying new dressing. Wound #4 Right,Medial Malleolus: May Shower, gently pat wound dry prior to applying new dressing. Wound #5 Right,Distal,Lateral Lower Leg: May Shower, gently pat wound dry prior to applying new dressing. [name], [name] ([id number]) Skin Barriers/Peri-Wound Care: Wound #3 Right,Posterior Lower Leg: Moisturizing  lotion - mixed with TCA Triamcinolone Acetonide Ointment (TCA) - mixed with lotion Wound #4 Right,Medial Malleolus: Moisturizing lotion - mixed with TCA Triamcinolone Acetonide Ointment (TCA) - mixed with lotion Wound #5 Right,Distal,Lateral Lower Leg: Moisturizing lotion - mixed with TCA Triamcinolone Acetonide Ointment (TCA) - mixed with lotion Primary Wound Dressing: Wound #3 Right,Posterior Lower Leg: Silver Alginate Wound #4 Right,Medial Malleolus: Silver Alginate Wound #5 Right,Distal,Lateral Lower Leg: Silver Alginate Secondary Dressing: Wound #3 Right,Posterior Lower Leg: ABD and Kerlix/Conform Wound #4 Right,Medial Malleolus: ABD and Kerlix/Conform Wound #5 Right,Distal,Lateral Lower Leg: ABD and Kerlix/Conform Dressing Change Frequency: Wound #3 Right,Posterior Lower Leg: Change dressing every day. Wound #4 Right,Medial Malleolus: Change dressing every day. Wound #5 Right,Distal,Lateral Lower Leg: Change dressing every day. Follow-up Appointments: Wound #3 Right,Posterior Lower Leg: Return Appointment in 2 weeks. Wound #4 Right,Medial Malleolus: Return Appointment in 2 weeks. Wound #5 Right,Distal,Lateral Lower Leg: Return Appointment in 2 weeks. Edema Control: Wound #3 Right,Posterior Lower Leg: Elevate legs to the level of the heart and pump ankles as often as possible Wound #4 Right,Medial Malleolus: Elevate legs to the level of the heart and pump ankles as often as possible Wound #5 Right,Distal,Lateral Lower Leg: Elevate legs to the level of the heart and pump ankles as often as possible The following medication(s) was prescribed: triamcinolone acetonide topical 0.1 % cream cream topical mixed 1 to 1 with Eucerin cream and then applied to the right LE where the skin is dry, red, and cracked. Apply daily starting 07/07/2018 Currently my suggestion is going to be that we continue with the above wound care measures. The one change that I am gonna make it this  time is initiating a one to one mix of Eucerin and triamcinolone 0.1% cream to be applied over the right lower extremity with each dressing change not covering the wounds but in the periwound location where there is erythematous, dry crop, and cracked skin. She's in agreement this plan and I did send this to her pharmacy today. We will subsequently see were things stand at follow-up. I am gonna see her for two week  check if anything changes in the meantime the patient will let me know. Otherwise I'm hopeful that we will be able to get this to completely close as far as the ulcerations are concerned. I will see her back for reevaluation to see were things stand she will complete the last of the doxycycline in the meantime. [name], [name] ([id number]) Please see above for specific wound care orders. We will see patient for re-evaluation in 2 week(s) here in the clinic. If anything worsens or changes patient will contact our office for additional recommendations. Electronic Signature(s) Signed: 07/08/2018 8:03:16 PM By: Lenda Kelp PA-C Entered By: Lenda Kelp on 07/07/2018 10:16:19 Shave, [name] ([id number]) -------------------------------------------------------------------------------- ROS/PFSH Details Patient Name: Marcia [name] Date of Service: 07/07/2018 9:30 AM Medical Record Number: [id number] Patient Account Number: [id number] Date of Birth/Sex: Sep 12, 1946 (72 y.o. F) Treating RN: Curtis Sites Primary Care Provider: Sherrie Mustache Other Clinician: Referring Provider: Sherrie Mustache Treating Provider/Extender: Linwood Dibbles, HOYT Weeks in Treatment: 8 Information Obtained From Patient Wound History Do you currently have one or more open woundso Yes How many open wounds do you currently haveo 5 Approximately how long have you had your woundso 6 How have you been treating your wound(s) until nowo abx Has your wound(s) ever healed and then re-openedo No Have you had any lab  work done in the past montho Yes Who ordered the lab work doneo hospital Have you tested positive for an antibiotic resistant organism (MRSA, VRE)o No Have you tested positive for osteomyelitis (bone infection)o No Have you had any tests for circulation on your legso Yes Constitutional Symptoms (General Health) Complaints and Symptoms: Negative for: Fever; Chills Medical History: Past Medical History Notes: vitamin d deficiency Cardiovascular Complaints and Symptoms: Positive for: LE edema Medical History: Positive for: Hypertension Negative for: Angina; Arrhythmia; Congestive Heart Failure; Coronary Artery Disease; Deep Vein Thrombosis; Hypotension; Myocardial Infarction; Peripheral Arterial Disease; Peripheral Venous Disease; Phlebitis; Vasculitis Past Medical History Notes: dyspnea 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, [place] Eyes Medical History: Negative for: Cataracts; Glaucoma; Optic Neuritis Ear/Nose/Mouth/Throat Medical History: Negative for: Chronic sinus problems/congestion; Middle ear problems Hematologic/Lymphatic [name], [name] ([id number]) Medical History: Negative for: Anemia; Hemophilia; Human Immunodeficiency Virus; Lymphedema; Sickle Cell Disease Respiratory Complaints and Symptoms: No Complaints or Symptoms Medical History: Negative for: Aspiration; Asthma; Chronic Obstructive Pulmonary Disease (COPD); Pneumothorax; Sleep Apnea; Tuberculosis Past Medical History Notes: nodule of lung Gastrointestinal Medical History: Negative for: Cirrhosis ; Colitis; Crohnos; Hepatitis A; Hepatitis B; Hepatitis C Endocrine Medical History: Positive for: Type II Diabetes Time with diabetes: 10 years Treated with: Oral agents Blood sugar tested every day: Yes Tested : every mornimg Genitourinary Medical History: Negative for: End Stage Renal Disease Immunological Medical History: Negative for: Lupus Erythematosus; Raynaudos; Scleroderma Integumentary  (Skin) Medical History: Negative for: History of Burn; History of pressure wounds Musculoskeletal Medical History: Negative for: Gout; Rheumatoid Arthritis; Osteoarthritis; Osteomyelitis Past Medical History Notes: arthritis Neurologic Medical History: Positive for: Neuropathy - peripheral Negative for: Dementia; Quadriplegia; Paraplegia; Seizure Disorder Oncologic [name], [name] ([id number]) Medical History: Negative for: Received Chemotherapy; Received Radiation Psychiatric Complaints and Symptoms: No Complaints or Symptoms Medical History: Negative for: Anorexia/bulimia; Confinement Anxiety Immunizations Pneumococcal Vaccine: Received Pneumococcal Vaccination: Yes Tetanus Vaccine: Last tetanus shot: 05/12/2014 Immunization Notes: plan on flu shot Implantable Devices Family and Social History Cancer: Yes - Siblings; Diabetes: Yes - Mother,Father,Siblings; Heart Disease: Yes - Father; Hypertension: Yes - Father; Kidney Disease: No; Lung Disease: No; Seizures: No; Stroke: Yes - Father,Mother;  Thyroid Problems: No; Tuberculosis: No; Former smoker - 10-15 years quit; Marital Status - Married; Alcohol Use: Never; Drug Use: No History; Caffeine Use: Daily; Financial Concerns: No; Food, Clothing or Shelter Needs: No; Support System Lacking: No; Transportation Concerns: No; Advanced Directives: No; Patient does not want information on Advanced Directives; Living Will: No; Medical Power of Attorney: No Physician Affirmation I have reviewed and agree with the above information. Electronic Signature(s) Signed: 07/07/2018 5:00:45 PM By: Curtis Sites Signed: 07/08/2018 8:03:16 PM By: Lenda Kelp PA-C Entered By: Lenda Kelp on 07/07/2018 10:13:06 [name], Ura [name] [name] ([id number]) -------------------------------------------------------------------------------- SuperBill Details Patient Name: Marcia [name] Date of Service: 07/07/2018 Medical Record Number: [id number] Patient  Account Number: [id number] Date of Birth/Sex: 11-07-1945 (72 y.o. F) Treating RN: Curtis Sites Primary Care Provider: Sherrie Mustache Other Clinician: Referring Provider: Sherrie Mustache Treating Provider/Extender: Linwood Dibbles, HOYT Weeks in Treatment: 8 Diagnosis Coding ICD-10 Codes Code Description 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 L97.212 Non-pressure chronic ulcer of right calf with fat layer exposed L97.312 Non-pressure chronic ulcer of right ankle with fat layer exposed E11.622 Type 2 diabetes mellitus with other skin ulcer Facility Procedures CPT4 Code: [id number] Description: 11042 - DEB SUBQ TISSUE 20 SQ CM/< ICD-10 Diagnosis Description L97.212 Non-pressure chronic ulcer of right calf with fat layer exp L97.312 Non-pressure chronic ulcer of right ankle with fat layer ex Modifier: osed posed Quantity: 1 Physician Procedures CPT4 Code: 5784696 Description: 99214 - WC PHYS LEVEL 4 - EST PT ICD-10 Diagnosis Description 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 L97.212 Non-pressure chronic ulcer of right calf with fat layer exp L97.312 Non-pressure chronic ulcer of right ankle with fat layer ex E11.622 Type 2  diabetes mellitus with other skin ulcer Modifier: osed posed Quantity: 1 CPT4 Code: 2952841 Description: 11042 - WC PHYS SUBQ TISS 20 SQ CM ICD-10 Diagnosis Description L97.212 Non-pressure chronic ulcer of right calf with fat layer exp L97.312 Non-pressure chronic ulcer of right ankle with fat layer ex Modifier: osed posed Quantity: 1 Electronic Signature(s) Signed: 07/08/2018 8:03:16 PM By: Lenda Kelp PA-C Entered By: Lenda Kelp on 07/07/2018 10:17:12

## 2018-07-21 ENCOUNTER — Encounter: Payer: Medicare HMO | Admitting: Physician Assistant

## 2018-07-21 DIAGNOSIS — E11622 Type 2 diabetes mellitus with other skin ulcer: Secondary | ICD-10-CM | POA: Diagnosis not present

## 2018-07-24 NOTE — Progress Notes (Addendum)
DAZIAH, HESLER ([id number]) Visit Report for 07/21/2018 Chief Complaint Document Details Patient Name: [name], [name] A. Date of Service: 07/21/2018 11:00 AM Medical Record Number: [id number] Patient Account Number: [id number] Date of Birth/Sex: 1946/03/09 (72 y.o. F) Treating RN: Curtis Sites Primary Care Provider: Sherrie Mustache Other Clinician: Referring Provider: Sherrie Mustache Treating Provider/Extender: Linwood Dibbles, Garnet Overfield Weeks in Treatment: 10 Information Obtained from: Patient Chief Complaint RLE wounds Electronic Signature(s) Signed: 07/22/2018 11:13:36 AM By: Lenda Kelp PA-C Entered By: Lenda Kelp on 07/21/2018 11:13:26 Goldner, Justiss [name] Kitchen ([id number]) -------------------------------------------------------------------------------- Debridement Details Patient Name: Marcia [name] Date of Service: 07/21/2018 11:00 AM Medical Record Number: [id number] Patient Account Number: [id number] Date of Birth/Sex: January 26, 1946 (72 y.o. F) Treating RN: Curtis Sites Primary Care Provider: Sherrie Mustache Other Clinician: Referring Provider: Sherrie Mustache Treating Provider/Extender: Linwood Dibbles, Khristi Schiller Weeks in Treatment: 10 Debridement Performed for Wound #4 Right,Medial Malleolus Assessment: Performed By: Physician STONE III, Manal Kreutzer E., PA-C Debridement Type: Debridement Severity of Tissue Pre Fat layer exposed Debridement: Level of Consciousness (Pre- Awake and Alert procedure): Pre-procedure Verification/Time Yes - 11:40 Out Taken: Start Time: 11:40 Pain Control: Lidocaine 4% Topical Solution Total Area Debrided (L x W): 0.8 (cm) x 1 (cm) = 0.8 (cm) Tissue and other material Viable, Non-Viable, Slough, Subcutaneous, Slough debrided: Level: Skin/Subcutaneous Tissue Debridement Description: Excisional Instrument: Curette Bleeding: None End Time: 11:43 Procedural Pain: 0 Post Procedural Pain: 0 Response to Treatment: Procedure was tolerated well Level of  Consciousness Awake and Alert (Post-procedure): Post Debridement Measurements of Total Wound Length: (cm) 0.8 Width: (cm) 1 Depth: (cm) 0.1 Volume: (cm) 0.063 Character of Wound/Ulcer Post Debridement: Improved Severity of Tissue Post Debridement: Fat layer exposed Post Procedure Diagnosis Same as Pre-procedure Electronic Signature(s) Signed: 07/21/2018 5:26:03 PM By: Curtis Sites Signed: 07/22/2018 11:13:36 AM By: Lenda Kelp PA-C Entered By: Curtis Sites on 07/21/2018 11:43:31 [name], [name] Kitchen ([id number]) -------------------------------------------------------------------------------- Debridement Details Patient Name: Marcia [name] Date of Service: 07/21/2018 11:00 AM Medical Record Number: [id number] Patient Account Number: [id number] Date of Birth/Sex: [birth year] (72 y.o. F) Treating RN: Curtis Sites Primary Care Provider: Sherrie Mustache Other Clinician: Referring Provider: Sherrie Mustache Treating Provider/Extender: Linwood Dibbles, Briena Swingler Weeks in Treatment: 10 Debridement Performed for Wound #3 Right,Posterior Lower Leg Assessment: Performed By: Physician STONE III, Yalanda Soderman E., PA-C Debridement Type: Debridement Severity of Tissue Pre Fat layer exposed Debridement: Level of Consciousness (Pre- Awake and Alert procedure): Pre-procedure Verification/Time Yes - 11:43 Out Taken: Start Time: 11:43 Pain Control: Lidocaine 4% Topical Solution Total Area Debrided (L x W): 1.4 (cm) x 0.8 (cm) = 1.12 (cm) Tissue and other material Viable, Non-Viable, Slough, Subcutaneous, Slough debrided: Level: Skin/Subcutaneous Tissue Debridement Description: Excisional Instrument: Curette Bleeding: Minimum Hemostasis Achieved: Pressure End Time: 11:49 Procedural Pain: 0 Post Procedural Pain: 0 Response to Treatment: Procedure was tolerated well Level of Consciousness Awake and Alert (Post-procedure): Post Debridement Measurements of Total Wound Length: (cm) 1.4 Width:  (cm) 0.8 Depth: (cm) 0.1 Volume: (cm) 0.088 Character of Wound/Ulcer Post Debridement: Improved Severity of Tissue Post Debridement: Fat layer exposed Post Procedure Diagnosis Same as Pre-procedure Electronic Signature(s) Signed: 07/21/2018 5:26:03 PM By: Curtis Sites Signed: 07/22/2018 11:13:36 AM By: Lenda Kelp PA-C Entered By: Curtis Sites on 07/21/2018 11:50:14 [name], [name] Kitchen ([id number]) -------------------------------------------------------------------------------- HPI Details Patient Name: Marcia [name] Date of Service: 07/21/2018 11:00 AM Medical Record Number: [id number] Patient Account Number: [id number] Date of Birth/Sex: 04-18-1946 (72 y.o. F) Treating RN: Curtis Sites Primary Care Provider: Sherrie Mustache Other Clinician: Referring Provider: Dario Guardian,  [facility] Treating Provider/Extender: STONE III, Aaira Oestreicher Weeks in Treatment: 10 History of Present Illness HPI Description: 05/12/18-She is seen in initial evaluation for multiple open areas to the right lower extremity. She was recently hospitalized (7/30-8/2) with cellulitis of the right leg, failed outpatient therapy with Keflex. While hospitalized she was treated with IV vancomycin and cefepime; right foot x-ray was negative for osteomyelitis. She admits to intermittent pain, improved since hospitalization. She was discharged on doxycycline which she should complete with the next 48 hours. 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 She has a remote history of following with vascular medicine for venous reflux, she does not tolerate wearing compression stockings d/t skin sensitivities and "burning" sensation. In office ABI are elevated but she has strongly palpable pulses. We will initiate hydrofera blue and ace wrap compression. She is a diabetic with A1c 6.9 while hospitalized. 05/19/18- She is seen in follow up evaluation  for RLE wounds; the lateral area is healed. She did tolerate ace wrap for compression, but experienced increased xerosis. We will hold off on ace wrap and increase lachydrin to twice daily. She c/o pain to the medial cluster of wounds, there is no apparent infection and will initiate steroid cream. She will follow up next week 05/26/18 on evaluation today patient appears to actually be having some issues currently with pain. She has been utilizing the dressings as previously recommended for the lower extremities. With that being said she does appear to have a little bit of a rash she states that anytime it he's in for anything else touches her skin that she will often develop a rash. She's not even sure that should be able to tolerate a compression wrap due to the fact that again she would be concerned about even the cotton layer being in contact with her skin which overtime will cause a burning sensation. She has seen dermatology concerning this unfortunately they really did not have a good answer for her as to why this is occurring. Fortunately there is no evidence of infection at this time. 05/29/18 on evaluation today patient was actually supposed to be back in the office in order to have a dressings/wrap change is a nurse visit. With that being said she was having significant discomfort with want to touch around the wound and. It green discharge. Subsequently the concern was that she had an infection [name] we did convert her to a physician visit instead of just a nurse visit. Subsequently upon evaluation she does seem to have purulent discharge along with evidence of cellulitis extending up the leg which is definitely worse than when I saw her earlier in the week. Fortunately there does not appear to be any evidence of systemic infection at this point. No fevers, chills, nausea, or vomiting noted at this time. 06/02/18 on evaluation today the patient appears to be doing better in regard to the overall  appearance in regard to the wound bed. She still does have slough covering the surface of the wounds and due to pain really I do not think sharp debridement is going to be advisable at this point. Nonetheless I do feel like that she is definitely showing signs of improvement compared to her previous evaluation. Her pain level is still rather high she tells me at times 8-9/10. Unfortunately she is having a difficult time getting into pain management she states that the one that we referred her to stated it  would be at least two weeks before they can get her in. 06/12/18 on evaluation today patient actually appears to be doing much better in regard to her lower extremity wounds. In fact there does not appear to be any evidence of infection at this time I do believe the Cipro has been effective her pain is also greatly improved compared to previous. Nonetheless she does have some Slough noted that is gonna require debridement. Patient's MRI was reviewed and revealed that she had no evidence of osteomyelitis, septic arthritis, or absence. She did have blisters noted on the surface of the foot which again visually we see today as well. 06/16/18 on evaluation today patient actually appears to be doing fairly well in regard to her lower extremity ulcerations. With that being said she does have issues at this point with discomfort although she did see the pain management physician this morning he did give her medication to help in this regard. She tells me that after I debrided her wound last week that she actually had significant pain to the point that she ended up having a difficult time even driving to get home. Obviously when she left the clinic she was not in that much pain this somewhat surprises me especially since we put lidocaine on her wounds prior to applying the dressings and the dressing we were using was Prisma which is not notorious for causing discomfort and Flitton, Oletha A.  ([id number]) burning. Nonetheless she did obviously experience this and she tells me that she set intermittent spells where this occurred in the week since I last saw her. Nonetheless she has been given the pain medications necessary in order to keep things under control at this point. Obviously this is short term but nonetheless I think will be helpful in getting the wounds to heal [name] that we can appropriately debrided do what we need to do. 06/23/18 on evaluation today patient actually appears to be doing significantly better in regard to her right lower extremity ulcerations. In fact she has made dramatic improvement pretty much all locations which is great news. Fortunately there's no evidence of infection at this time. Her irritation and inflammation also is better than I do believe that the treatment utilizing the Santyl has been beneficial over the past week. I'm not even sure that we need that any further. 06/30/18 on evaluation today patient actually is doing worse compared to her previous evaluation. Unfortunately she's been having increased drainage, pain, and overall I feel like this is likely due to infection based on the appearance of her wound today. In general the patient seems to have digressed although not really back to where she started although definitely not as well she's been doing in the past couple of weeks. She's not having green drainage like she did previous but she is having a lot of drainage to the point that she's actually dripping from the posterior portion of her leg at this time. 07/07/18 on evaluation today patient actually appears to be doing rather well in regard to her right lower extremity ulcers. They also do measuring smaller which is good news she has a lot of dry and cracked skin which is gonna make her more prone to reinfection we need to work on this. Nonetheless I do believe that in general the patient has been doing well overall with her current dressing  changes and I do believe the doxycycline was beneficial she did have a Staphylococcus aureus infection fortunately this was not MRSA. The doxycycline  also is appropriate for this infection. 07/21/18 on evaluation today patient's wound bed actually shows evidence of good improvement. This is really in regard to all locations. There was some dried dressing/eschar covering several of the areas but fortunately she's not having the amount of pain that she previously was experiencing. No fevers, chills, nausea, or vomiting noted at this time. Electronic Signature(s) Signed: 07/23/2018 5:08:41 PM By: Lenda Kelp PA-C Entered By: Lenda Kelp on 07/23/2018 10:08:07 [name] ([id number]) -------------------------------------------------------------------------------- Physical Exam Details Patient Name: Marcia [name] Date of Service: 07/21/2018 11:00 AM Medical Record Number: [id number] Patient Account Number: [id number] Date of Birth/Sex: 02-23-1946 (72 y.o. F) Treating RN: Curtis Sites Primary Care Provider: Sherrie Mustache Other Clinician: Referring Provider: Sherrie Mustache Treating Provider/Extender: Linwood Dibbles, Tyrica Afzal Weeks in Treatment: 10 Constitutional Well-nourished and well-hydrated in no acute distress. Respiratory normal breathing without difficulty. Psychiatric this patient is able to make decisions and demonstrates good insight into disease process. Alert and Oriented x 3. pleasant and cooperative. Notes Patient's wound bed currently did require debridement to remove some of the dry eschar from the surface of the wound. She actually tolerated this well today without complication. The good news is underline a lot of these eschar areas the patient's wounds were actually completely healed. There was some slough removed as well. Electronic Signature(s) Signed: 07/23/2018 5:08:41 PM By: Lenda Kelp PA-C Entered By: Lenda Kelp on 07/23/2018 10:08:59 [name], [name]  ([id number]) -------------------------------------------------------------------------------- Physician Orders Details Patient Name: Marcia [name] Date of Service: 07/21/2018 11:00 AM Medical Record Number: [id number] Patient Account Number: [id number] Date of Birth/Sex: 1945-12-23 (72 y.o. 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 Wound #4 Right,Medial Malleolus o May Shower, gently pat wound dry prior to applying new dressing. Wound #5 Right,Distal,Lateral Lower Leg o May Shower, gently pat wound dry prior to applying new dressing. Skin Barriers/Peri-Wound Care Wound #3 Right,Posterior Lower Leg o Moisturizing lotion - mixed with TCA o Triamcinolone Acetonide Ointment (TCA) - mixed with lotion Wound #4 Right,Medial Malleolus o Moisturizing lotion - mixed with TCA o Triamcinolone Acetonide Ointment (TCA) - mixed with lotion Wound #5 Right,Distal,Lateral Lower Leg o Moisturizing lotion - mixed with TCA o Triamcinolone Acetonide Ointment (TCA) - mixed with lotion Primary Wound Dressing Wound #3 Right,Posterior Lower Leg o Silver Collagen - moistened with saline or hydrogel Wound #4 Right,Medial Malleolus o Silver Alginate Wound #5 Right,Distal,Lateral Lower Leg o Silver Alginate Secondary Dressing Wound #3 Right,Posterior Lower Leg o ABD and Kerlix/Conform [name], [name] ([id number]) Wound #4  Right,Medial Malleolus o ABD and Kerlix/Conform Wound #5 Right,Distal,Lateral Lower Leg o ABD and Kerlix/Conform Dressing Change Frequency Wound #3 Right,Posterior Lower Leg o Change dressing every day. Wound #4 Right,Medial Malleolus o Change dressing every day. Wound #5 Right,Distal,Lateral Lower Leg o Change dressing every day. Follow-up Appointments Wound #3 Right,Posterior Lower Leg o Return Appointment in 2 weeks. Wound #4 Right,Medial Malleolus o Return Appointment in 2 weeks. Wound #5 Right,Distal,Lateral Lower Leg o Return Appointment in 2 weeks. Edema  Control Wound #3 Right,Posterior Lower Leg o Elevate legs to the level of the heart and pump ankles as often as possible Wound #4 Right,Medial Malleolus o Elevate legs to the level of the heart and pump ankles as often as possible Wound #5 Right,Distal,Lateral Lower Leg o Elevate legs to the level of the heart and pump ankles as often as possible Electronic Signature(s) Signed: 07/21/2018 5:26:03 PM By: Curtis Sites Signed: 07/22/2018 11:13:36 AM By: Lenda Kelp PA-C Entered By: Curtis Sites on 07/21/2018 11:59:07 [name], [name] Kitchen ([id number]) -------------------------------------------------------------------------------- Problem List Details Patient Name: Marcia [name] Date of Service: 07/21/2018 11:00 AM Medical Record Number: [id number] Patient Account Number: [id number] Date of Birth/Sex: 02-05-1946 (72 y.o. 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 05/12/2018 No Yes L97.212 Non-pressure chronic ulcer of right calf with fat layer exposed 05/12/2018 No Yes L97.312 Non-pressure chronic ulcer of right ankle with fat layer 05/12/2018  No Yes exposed E11.622 Type 2 diabetes mellitus with other skin ulcer 05/12/2018 No Yes Inactive Problems Resolved Problems Electronic Signature(s) Signed: 07/22/2018 11:13:36 AM By: Lenda Kelp PA-C Entered By: Lenda Kelp on 07/21/2018 11:12:53 [name], [name] Kitchen ([id number]) -------------------------------------------------------------------------------- Progress Note Details Patient Name: Marcia [name] Date of Service: 07/21/2018 11:00 AM Medical Record Number: [id number] Patient Account Number: [id number] Date of Birth/Sex: [birth year] (72 y.o. F) Treating RN: Curtis Sites Primary Care Provider: Sherrie Mustache Other Clinician: Referring Provider: Sherrie Mustache Treating Provider/Extender: Linwood Dibbles, Sanya Kobrin Weeks in Treatment: 10 Subjective Chief Complaint Information obtained from Patient RLE wounds History of Present Illness (HPI) 05/12/18-She is seen in initial evaluation for multiple open areas to the right lower extremity. She was recently hospitalized (7/30-8/2) with cellulitis of the right leg, failed outpatient therapy with Keflex. While hospitalized she was treated with IV vancomycin and cefepime; right foot x-ray was negative for osteomyelitis. She admits to intermittent pain, improved since hospitalization. She was discharged on doxycycline which she should complete with the next 48 hours. 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 She has a remote history of following with vascular medicine for venous reflux, she does not tolerate wearing compression stockings d/t skin sensitivities and "burning" sensation. In office ABI are elevated but she has strongly palpable pulses. We will initiate hydrofera blue and ace wrap compression. She is a diabetic with A1c 6.9 while hospitalized. 05/19/18- She is seen in follow up evaluation for RLE wounds; the lateral area is healed. She did  tolerate ace wrap for compression, but experienced increased xerosis. We will hold off on ace wrap and increase lachydrin to twice daily. She c/o pain to the medial cluster of wounds, there is no apparent infection and will initiate steroid cream. She will follow up next week 05/26/18 on evaluation today patient appears to actually be having some issues currently with pain. She has been utilizing the dressings as previously recommended for the lower extremities. With that being said she does appear to have a little bit of a rash she states that anytime it he's in for anything else touches her skin that she will often develop a rash. She's not even sure that should be able to tolerate a compression wrap due to the fact that again she would be concerned about even  the cotton layer being in contact with her skin which overtime will cause a burning sensation. She has seen dermatology concerning this unfortunately they really did not have a good answer for her as to why this is occurring. Fortunately there is no evidence of infection at this time. 05/29/18 on evaluation today patient was actually supposed to be back in the office in order to have a dressings/wrap change is a nurse visit. With that being said she was having significant discomfort with want to touch around the wound and. It green discharge. Subsequently the concern was that she had an infection [name] we did convert her to a physician visit instead of just a nurse visit. Subsequently upon evaluation she does seem to have purulent discharge along with evidence of cellulitis extending up the leg which is definitely worse than when I saw her earlier in the week. Fortunately there does not appear to be any evidence of systemic infection at this point. No fevers, chills, nausea, or vomiting noted at this time. 06/02/18 on evaluation today the patient appears to be doing better in regard to the overall appearance in regard to the wound bed. She still  does have slough covering the surface of the wounds and due to pain really I do not think sharp debridement is going to be advisable at this point. Nonetheless I do feel like that she is definitely showing signs of improvement compared to her previous evaluation. Her pain level is still rather high she tells me at times 8-9/10. Unfortunately she is having a difficult time getting into pain management she states that the one that we referred her to stated it would be at least two weeks before they can get her in. 06/12/18 on evaluation today patient actually appears to be doing much better in regard to her lower extremity wounds. In fact there does not appear to be any evidence of infection at this time I do believe the Cipro has been effective her pain is also greatly improved compared to previous. Nonetheless she does have some Slough noted that is gonna require debridement. Patient's MRI was reviewed and revealed that she had no evidence of osteomyelitis, septic arthritis, or absence. She did have blisters noted on the surface of the foot which again visually we see today as well. [name], [name] ([id number]) 06/16/18 on evaluation today patient actually appears to be doing fairly well in regard to her lower extremity ulcerations. With that being said she does have issues at this point with discomfort although she did see the pain management physician this morning he did give her medication to help in this regard. She tells me that after I debrided her wound last week that she actually had significant pain to the point that she ended up having a difficult time even driving to get home. Obviously when she left the clinic she was not in that much pain this somewhat surprises me especially since we put lidocaine on her wounds prior to applying the dressings and the dressing we were using was Prisma which is not notorious for causing discomfort and burning. Nonetheless she did obviously experience this  and she tells me that she set intermittent spells where this occurred in the week since I last saw her. Nonetheless she has been given the pain medications necessary in order to keep things under control at this point. Obviously this is short term but nonetheless I think will be helpful in getting the wounds to heal [name] that we can appropriately debrided  do what we need to do. 06/23/18 on evaluation today patient actually appears to be doing significantly better in regard to her right lower extremity ulcerations. In fact she has made dramatic improvement pretty much all locations which is great news. Fortunately there's no evidence of infection at this time. Her irritation and inflammation also is better than I do believe that the treatment utilizing the Santyl has been beneficial over the past week. I'm not even sure that we need that any further. 06/30/18 on evaluation today patient actually is doing worse compared to her previous evaluation. Unfortunately she's been having increased drainage, pain, and overall I feel like this is likely due to infection based on the appearance of her wound today. In general the patient seems to have digressed although not really back to where she started although definitely not as well she's been doing in the past couple of weeks. She's not having green drainage like she did previous but she is having a lot of drainage to the point that she's actually dripping from the posterior portion of her leg at this time. 07/07/18 on evaluation today patient actually appears to be doing rather well in regard to her right lower extremity ulcers. They also do measuring smaller which is good news she has a lot of dry and cracked skin which is gonna make her more prone to reinfection we need to work on this. Nonetheless I do believe that in general the patient has been doing well overall with her current dressing changes and I do believe the doxycycline was beneficial she did have a  Staphylococcus aureus infection fortunately this was not MRSA. The doxycycline also is appropriate for this infection. 07/21/18 on evaluation today patient's wound bed actually shows evidence of good improvement. This is really in regard to all locations. There was some dried dressing/eschar covering several of the areas but fortunately she's not having the amount of pain that she previously was experiencing. No fevers, chills, nausea, or vomiting noted at this time. Patient History Information obtained from Patient. Family History Cancer - Siblings, Diabetes - Mother,Father,Siblings, Heart Disease - Father, Hypertension - Father, Stroke - Father,Mother, No family history of Kidney Disease, Lung Disease, Seizures, Thyroid Problems, Tuberculosis. Social History Former smoker - 10-15 years quit, Marital Status - Married, Alcohol Use - Never, Drug Use - No History, Caffeine Use - Daily. 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 Respiratory The patient has no complaints or symptoms. Cardiovascular The patient has no complaints or symptoms. ANMOL, PASCHEN ([id number]) Psychiatric The patient has no complaints or symptoms. Objective Constitutional Well-nourished and well-hydrated in no acute distress. Vitals Time Taken: 11:07 AM, Height: 70 in, Weight: 193 lbs, BMI: 27.7, Temperature: 98.0 F, Pulse: 67 bpm, Respiratory Rate: 16 breaths/min, Blood Pressure: 141/54 mmHg. Respiratory normal breathing without difficulty. Psychiatric this patient is able to make decisions and demonstrates good insight into disease process. Alert and Oriented x 3. pleasant and cooperative. General Notes: Patient's  wound bed currently did require debridement to remove some of the dry eschar from the surface of the wound. She actually tolerated this well today without complication. The good news is underline a lot of these eschar areas the patient's wounds were actually completely healed. There was some slough removed as well. Integumentary (Hair, Skin) Wound #  3 status is Open. Original cause of wound was Gradually Appeared. The wound is located on the Right,Posterior Lower Leg. The wound measures 1.4cm length x 0.8cm width x 0.1cm depth; 0.88cm^2 area and 0.088cm^3 volume. There is Fat Layer (Subcutaneous Tissue) Exposed exposed. There is no tunneling or undermining noted. There is a small amount of serous drainage noted. The wound margin is flat and intact. There is small (1-33%) pink, pale granulation within the wound bed. There is a medium (34-66%) amount of necrotic tissue within the wound bed including Adherent Slough. The periwound skin appearance did not exhibit: Callus, Crepitus, Excoriation, Induration, Rash, Scarring, Dry/Scaly, Maceration, Atrophie Blanche, Cyanosis, Ecchymosis, Hemosiderin Staining, Mottled, Pallor, Rubor, Erythema. Periwound temperature was noted as No Abnormality. Wound #4 status is Open. Original cause of wound was Gradually Appeared. The wound is located on the Right,Medial Malleolus. The wound measures 0.8cm length x 1cm width x 0.1cm depth; 0.628cm^2 area and 0.063cm^3 volume. There is Fat Layer (Subcutaneous Tissue) Exposed exposed. There is no tunneling or undermining noted. There is a none present amount of drainage noted. The wound margin is flat and intact. There is no granulation within the wound bed. There is a small (1-33%) amount of necrotic tissue within the wound bed including Adherent Slough. The periwound skin appearance exhibited: Rubor. The periwound skin appearance did not exhibit: Callus, Crepitus, Excoriation, Induration, Rash, Scarring, Dry/Scaly,  Maceration, Atrophie Blanche, Cyanosis, Ecchymosis, Hemosiderin Staining, Mottled, Pallor, Erythema. Periwound temperature was noted as No Abnormality. Wound #5 status is Open. Original cause of wound was Gradually Appeared. The wound is located on the Right,Distal,Lateral Lower Leg. The wound measures 1.7cm length x 0.8cm width x 0.1cm depth; 1.068cm^2 area and 0.107cm^3 volume. There is Fat Layer (Subcutaneous Tissue) Exposed exposed. There is no tunneling or undermining noted. There is a medium amount of serous drainage noted. The wound margin is indistinct and nonvisible. There is no granulation within the wound bed. There is a small (1-33%) amount of necrotic tissue within the wound bed including Eschar and Adherent Slough. The periwound skin appearance exhibited: Dry/Scaly, Rubor. The periwound skin appearance did not exhibit: Callus, Crepitus, Excoriation, Induration, Rash, Scarring, Maceration, Atrophie Blanche, Cyanosis, Ecchymosis, Hemosiderin Staining, Mottled, Ishida, Kandie A. ([id number]) Pallor, Erythema. Periwound temperature was noted as No Abnormality. 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 There was a Excisional Skin/Subcutaneous Tissue Debridement with a total area of 1.12 sq cm performed by STONE III, Jerolene Kupfer E., PA-C. With the following instrument(s): Curette to remove Viable and Non-Viable tissue/material. Material removed includes Subcutaneous Tissue and Slough and after achieving pain control using Lidocaine 4% Topical Solution. No specimens were taken. A time out was conducted at 11:43, prior to the start of the  procedure. A Minimum amount of bleeding was controlled with Pressure. The procedure was tolerated well with a pain level of 0 throughout and a pain level of 0 following the procedure. Post Debridement Measurements: 1.4cm length x 0.8cm width x 0.1cm depth; 0.088cm^3 volume. Character of Wound/Ulcer Post Debridement is improved. Severity of Tissue Post Debridement is: Fat layer exposed. Post procedure Diagnosis Wound #3: Same as Pre-Procedure Wound #4 Pre-procedure diagnosis of Wound #4 is a Vasculitis located on the Right,Medial  Malleolus .Severity of Tissue Pre Debridement is: Fat layer exposed. There was a Excisional Skin/Subcutaneous Tissue Debridement with a total area of 0.8 sq cm performed by STONE III, Camelle Henkels E., PA-C. With the following instrument(s): Curette to remove Viable and Non-Viable tissue/material. Material removed includes Subcutaneous Tissue and Slough and after achieving pain control using Lidocaine 4% Topical Solution. No specimens were taken. A time out was conducted at 11:40, prior to the start of the procedure. There was no bleeding. The procedure was tolerated well with a pain level of 0 throughout and a pain level of 0 following the procedure. Post Debridement Measurements: 0.8cm length x 1cm width x 0.1cm depth; 0.063cm^3 volume. Character of Wound/Ulcer Post Debridement is improved. Severity of Tissue Post Debridement is: Fat layer exposed. Post procedure Diagnosis Wound #4: Same as Pre-Procedure Plan Wound Cleansing: Wound #3 Right,Posterior Lower Leg: May Shower, gently pat wound dry prior to applying new dressing. Wound #4 Right,Medial Malleolus: May Shower, gently pat wound dry prior to applying new dressing. VIENNA, FOLDEN ([id number]) Wound #5 Right,Distal,Lateral Lower Leg: May Shower, gently pat wound dry prior to applying new dressing. Skin Barriers/Peri-Wound Care: Wound #3 Right,Posterior Lower Leg: Moisturizing lotion - mixed with TCA Triamcinolone  Acetonide Ointment (TCA) - mixed with lotion Wound #4 Right,Medial Malleolus: Moisturizing lotion - mixed with TCA Triamcinolone Acetonide Ointment (TCA) - mixed with lotion Wound #5 Right,Distal,Lateral Lower Leg: Moisturizing lotion - mixed with TCA Triamcinolone Acetonide Ointment (TCA) - mixed with lotion Primary Wound Dressing: Wound #3 Right,Posterior Lower Leg: Silver Collagen - moistened with saline or hydrogel Wound #4 Right,Medial Malleolus: Silver Alginate Wound #5 Right,Distal,Lateral Lower Leg: Silver Alginate Secondary Dressing: Wound #3 Right,Posterior Lower Leg: ABD and Kerlix/Conform Wound #4 Right,Medial Malleolus: ABD and Kerlix/Conform Wound #5 Right,Distal,Lateral Lower Leg: ABD and Kerlix/Conform Dressing Change Frequency: Wound #3 Right,Posterior Lower Leg: Change dressing every day. Wound #4 Right,Medial Malleolus: Change dressing every day. Wound #5 Right,Distal,Lateral Lower Leg: Change dressing every day. Follow-up Appointments: Wound #3 Right,Posterior Lower Leg: Return Appointment in 2 weeks. Wound #4 Right,Medial Malleolus: Return Appointment in 2 weeks. Wound #5 Right,Distal,Lateral Lower Leg: Return Appointment in 2 weeks. Edema Control: Wound #3 Right,Posterior Lower Leg: Elevate legs to the level of the heart and pump ankles as often as possible Wound #4 Right,Medial Malleolus: Elevate legs to the level of the heart and pump ankles as often as possible Wound #5 Right,Distal,Lateral Lower Leg: Elevate legs to the level of the heart and pump ankles as often as possible I'm gonna suggest currently that we continue with the above wound care measures for the next week. The patient is in agreement with plan. We will subsequently see were things stand at follow-up. Please see above for specific wound care orders. We will see patient for re-evaluation in 2 week(s) here in the clinic. If anything worsens or changes patient will contact our office  for additional recommendations. Electronic Signature(s) [name], [name] ([id number]) Signed: 07/23/2018 5:08:41 PM By: Lenda Kelp PA-C Entered By: Lenda Kelp on 07/23/2018 10:09:30 [name], [name] ([id number]) -------------------------------------------------------------------------------- ROS/PFSH Details Patient Name: Marcia [name] Date of Service: 07/21/2018 11:00 AM Medical Record Number: [id number] Patient Account Number: [id number] Date of Birth/Sex: 08-02-1946 (72 y.o. F) Treating RN: Curtis Sites Primary Care Provider: Sherrie Mustache Other Clinician: Referring Provider: Sherrie Mustache Treating Provider/Extender: Linwood Dibbles, Chinedu Agustin Weeks in Treatment: 10 Information Obtained From Patient Wound History Do you currently have one or more open woundso Yes How many open wounds do you currently  haveo 5 Approximately how long have you had your woundso 6 How have you been treating your wound(s) until nowo abx Has your wound(s) ever healed and then re-openedo No Have you had any lab work done in the past montho Yes Who ordered the lab work doneo hospital Have you tested positive for an antibiotic resistant organism (MRSA, VRE)o No Have you tested positive for osteomyelitis (bone infection)o No Have you had any tests for circulation on your legso Yes Constitutional Symptoms (General Health) Complaints and Symptoms: Negative for: Fever; Chills Medical History: Past Medical History Notes: vitamin d deficiency Eyes Medical History: Negative for: Cataracts; Glaucoma; Optic Neuritis Ear/Nose/Mouth/Throat Medical History: Negative for: Chronic sinus problems/congestion; Middle ear problems Hematologic/Lymphatic Medical History: Negative for: Anemia; Hemophilia; Human Immunodeficiency Virus; Lymphedema; Sickle Cell Disease Respiratory Complaints and Symptoms: No Complaints or Symptoms Medical History: Negative for: Aspiration; Asthma; Chronic Obstructive Pulmonary Disease  (COPD); Pneumothorax; Sleep Apnea; Tuberculosis Past Medical History Notes: nodule of lung Scarpati, Takenya A. ([id number]) Cardiovascular Complaints and Symptoms: No Complaints or Symptoms Medical History: Positive for: Hypertension Negative for: Angina; Arrhythmia; Congestive Heart Failure; Coronary Artery Disease; Deep Vein Thrombosis; Hypotension; Myocardial Infarction; Peripheral Arterial Disease; Peripheral Venous Disease; Phlebitis; Vasculitis Past Medical History Notes: dyspnea 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, AVV Gastrointestinal Medical History: Negative for: Cirrhosis ; Colitis; Crohnos; Hepatitis A; Hepatitis B; Hepatitis C Endocrine Medical History: Positive for: Type II Diabetes Time with diabetes: 10 years Treated with: Oral agents Blood sugar tested every day: Yes Tested : every mornimg Genitourinary Medical History: Negative for: End Stage Renal Disease Immunological Medical History: Negative for: Lupus Erythematosus; Raynaudos; Scleroderma Integumentary (Skin) Medical History: Negative for: History of Burn; History of pressure wounds Musculoskeletal Medical History: Negative for: Gout; Rheumatoid Arthritis; Osteoarthritis; Osteomyelitis Past Medical History Notes: arthritis Neurologic Medical History: Positive for: Neuropathy - peripheral Negative for: Dementia; Quadriplegia; Paraplegia; Seizure Disorder Oncologic Medical History: Negative for: Received Chemotherapy; Received Radiation KODY, PURECO ([id number]) Psychiatric Complaints and Symptoms: No Complaints or Symptoms Medical History: Negative for: Anorexia/bulimia; Confinement Anxiety Immunizations Pneumococcal Vaccine: Received Pneumococcal Vaccination: Yes Tetanus Vaccine: Last tetanus shot: 05/12/2014 Immunization Notes: plan on flu shot Implantable Devices Family and Social History Cancer: Yes - Siblings; Diabetes: Yes - Mother,Father,Siblings; Heart Disease: Yes -  Father; Hypertension: Yes - Father; Kidney Disease: No; Lung Disease: No; Seizures: No; Stroke: Yes - Father,Mother; Thyroid Problems: No; Tuberculosis: No; Former smoker - 10-15 years quit; Marital Status - Married; Alcohol Use: Never; Drug Use: No History; Caffeine Use: Daily; Financial Concerns: No; Food, Clothing or Shelter Needs: No; Support System Lacking: No; Transportation Concerns: No; Advanced Directives: No; Patient does not want information on Advanced Directives; Living Will: No; Medical Power of Attorney: No Physician Affirmation I have reviewed and agree with the above information. Electronic Signature(s) Signed: 07/23/2018 5:08:41 PM By: Lenda Kelp PA-C Signed: 07/23/2018 5:25:24 PM By: Curtis Sites Entered By: Lenda Kelp on 07/23/2018 10:08:35 Raya, [name] ([id number]) -------------------------------------------------------------------------------- SuperBill Details Patient Name: Marcia [name] Date of Service: 07/21/2018 Medical Record Number: [id number] Patient Account Number: [id number] Date of Birth/Sex: 01-28-1946 (72 y.o. F) Treating RN: Curtis Sites Primary Care Provider: Sherrie Mustache Other Clinician: Referring Provider: Sherrie Mustache Treating Provider/Extender: Linwood Dibbles, Ryheem Jay Weeks in Treatment: 10 Diagnosis Coding ICD-10 Codes Code Description 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 L97.212 Non-pressure chronic ulcer of right calf with fat layer exposed L97.312 Non-pressure chronic ulcer of right ankle with fat layer exposed E11.622 Type 2 diabetes mellitus with other skin ulcer Facility Procedures  CPT4 Code: 16109604 Description: 11042 - DEB SUBQ TISSUE 20 SQ CM/< ICD-10 Diagnosis Description L97.212 Non-pressure chronic ulcer of right calf with fat layer exp L97.312 Non-pressure chronic ulcer of right ankle with fat layer ex Modifier: osed posed Quantity: 1 Physician Procedures CPT4 Code: [id number] Description: 11042 - WC PHYS SUBQ TISS 20 SQ CM  ICD-10 Diagnosis Description L97.212 Non-pressure chronic ulcer of right calf with fat layer exp L97.312 Non-pressure chronic ulcer of right ankle with fat layer ex Modifier: osed posed Quantity: 1 Electronic Signature(s) Signed: 07/23/2018 5:08:41 PM By: Lenda Kelp PA-C Previous Signature: 07/22/2018 11:13:36 AM Version By: Lenda Kelp PA-C Entered By: Lenda Kelp on 07/23/2018 10:10:28

## 2018-07-24 NOTE — Progress Notes (Signed)
[name], [name] ([id number]) Visit Report for 07/21/2018 Arrival Information Details Patient Name: [name], [name] Date of Service: 07/21/2018 11:00 AM Medical Record Number: [id number] Patient Account Number: [id number] Date of Birth/Sex: [birth year] (72 y.o. F) Treating RN: [name] Primary Care [name]: [name] Other Clinician: Referring [name]: [name] Treating [name]/Extender: [name], HOYT Weeks in [name]: 10 Visit Information History Since Last Visit Added or deleted any medications: No Patient Arrived: Ambulatory Any new allergies or adverse reactions: No Arrival Time: 11:06 Had a fall or experienced change in No Accompanied By: self activities of daily living that may affect Transfer Assistance: None risk of falls: Patient Identification Verified: Yes Signs or symptoms of abuse/neglect since last visito No Secondary Verification Process Completed: Yes Hospitalized since last visit: No Patient Requires Transmission-Based No Implantable device outside of the clinic excluding No Precautions: cellular tissue based products placed in the center Patient Has Alerts: Yes since last visit: Patient Alerts: DMII Has Dressing in Place as Prescribed: Yes Pain Present Now: Yes Electronic Signature(s) Signed: 07/21/2018 4:58:41 PM By: [name] RCP, RRT, CHT Entered By: [name], Marcia Edward on 07/21/2018 11:07:51 [name], Aradhana AMarland Kitchen ([id number]) -------------------------------------------------------------------------------- Lower Extremity Assessment Details Patient Name: Marcia [name] Date of Service: 07/21/2018 11:00 AM Medical Record Number: [id number] Patient Account Number: [id number] Date of Birth/Sex: Oct 16, 1945 (72 y.o. F) Treating RN: [name] Primary Care Emilia Kayes: [name] Other Clinician: Referring Merikay [name]: [name] Treating [name]/Extender: [name], HOYT Weeks in [name]: 10 Edema  Assessment Assessed: [Left: No] [Right: No] [Left: Edema] [Right: :] Calf Left: Right: Point of Measurement: 34 cm From Medial Instep 40 cm 40.5 cm Ankle Left: Right: Point of Measurement: 12 cm From Medial Instep 22 cm 20.5 cm Vascular Assessment Claudication: Claudication Assessment [Left:None] [Right:None] Pulses: Dorsalis Pedis Palpable: [Left:Yes] [Right:Yes] Posterior Tibial Extremity colors, hair growth, and conditions: Extremity Color: [Left:Normal] [Right:Normal] Hair Growth on Extremity: [Left:No] [Right:No] Temperature of Extremity: [Left:Warm] [Right:Warm] Capillary Refill: [Left:< 3 seconds] [Right:< 3 seconds] Toe Nail Assessment Left: Right: Thick: Yes Yes Discolored: Yes Yes Deformed: No No Improper Length and Hygiene: No No Electronic Signature(s) Signed: 07/21/2018 3:28:14 PM By: [name] Entered By: [name] on 07/21/2018 11:22:39 [name] ([id number]) -------------------------------------------------------------------------------- Multi Wound Chart Details Patient Name: Marcia [name] Date of Service: 07/21/2018 11:00 AM Medical Record Number: [id number] Patient Account Number: [id number] Date of Birth/Sex: 02-12-1946 (72 y.o. 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 Secondary Etiology: Diabetic Wound/Ulcer of the Diabetic Wound/Ulcer of the N/A Lower  Extremity Lower Extremity Comorbid History: Hypertension, Type II Hypertension, Type II Hypertension, Type II Diabetes, Neuropathy Diabetes, Neuropathy Diabetes, Neuropathy Date Acquired: 03/12/2018 03/12/2018 06/28/2018 Weeks of [name]: 10 10 3  Wound Status: Open Open Open Clustered Wound: No Yes Yes Clustered Quantity: N/A N/A 4 Measurements L x W x D 1.4x0.8x0.1 0.8x1x0.1 1.7x0.8x0.1 (cm) Area (cm) : 0.88 0.628 1.068 Volume (cm) : 0.088 0.063 0.107 % Reduction in Area: 17.00% 96.50% 94.60% % Reduction in Volume: 17.00% 98.30% 94.50% Classification: Full Thickness Without Full Thickness Without Grade 1 Exposed Support Structures  Exposed Support Structures Exudate Amount: Small None Present Medium Exudate Type: Serous N/A Serous Exudate Color: amber N/A amber Wound Margin: Flat and Intact Flat and Intact Indistinct, nonvisible Granulation Amount: Small (1-33%) None Present (0%) None Present (0%) Granulation Quality: Pink, Pale N/A N/A Necrotic Amount: Medium (34-66%) Small (1-33%) Small (1-33%) [name], [name] ([id number]) Necrotic Tissue: Adherent Slough Adherent Slough Eschar, Adherent Slough Exposed Structures: Fat Layer (Subcutaneous Fat Layer (Subcutaneous Fat Layer (Subcutaneous Tissue) Exposed: Yes Tissue) Exposed: Yes Tissue) Exposed: Yes Fascia: No Fascia: No Fascia: No Tendon: No Tendon: No Tendon: No Muscle: No Muscle: No Muscle: No Joint: No Joint: No Joint: No Bone: No Bone: No Bone: No Epithelialization: None Small (1-33%) Medium (34-66%) Periwound Skin Texture: Excoriation: No Excoriation: No Excoriation: No Induration: No Induration: No Induration: No Callus: No Callus: No Callus: No Crepitus: No Crepitus: No Crepitus: No Rash: No Rash: No Rash: No Scarring: No Scarring: No Scarring: No Periwound Skin Moisture: Maceration: No Maceration: No Dry/Scaly: Yes Dry/Scaly: No Dry/Scaly: No Maceration: No Periwound Skin Color: Atrophie  Blanche: No Rubor: Yes Rubor: Yes Cyanosis: No Atrophie Blanche: No Atrophie Blanche: No Ecchymosis: No Cyanosis: No Cyanosis: No Erythema: No Ecchymosis: No Ecchymosis: No Hemosiderin Staining: No Erythema: No Erythema: No Mottled: No Hemosiderin Staining: No Hemosiderin Staining: No Pallor: No Mottled: No Mottled: No Rubor: No Pallor: No Pallor: No Temperature: No Abnormality No Abnormality No Abnormality Tenderness on Palpation: No No No Wound Preparation: Ulcer Cleansing: Ulcer Cleansing: Ulcer Cleansing: Rinsed/Irrigated with Saline Rinsed/Irrigated with Saline Rinsed/Irrigated with Saline Topical Anesthetic Applied: Topical Anesthetic Applied: Topical Anesthetic Applied: Other: lidocaine 4% Other: lidocaine 4% Other: lidocaine 4% [name] Notes Electronic Signature(s) Signed: 07/21/2018 5:26:03 PM By: [name] Entered By: [name] on 07/21/2018 11:40:41 Drennon, [name] ([id number]) -------------------------------------------------------------------------------- Multi-Disciplinary Care Plan Details Patient Name: Marcia [name] Date of Service: 07/21/2018 11:00 AM Medical Record Number: [id number] Patient Account Number: [id number] Date of Birth/Sex: 1945-12-28 (72 y.o. F) Treating RN: [name] Primary Care Desmund Elman: [name] Other Clinician: Referring Trayonna Bachmeier: [name] Treating Marrio Scribner/Extender: [name], HOYT Weeks in [name]: 10 Active Inactive ` Abuse / Safety / Falls / Self Care Management Nursing Diagnoses: Potential for falls Goals: Patient will not experience any injury related to falls Date Initiated: 05/12/2018 Target Resolution Date: 08/15/2018 Goal Status: Active Interventions: Assess Activities of Daily Living upon admission and as needed Assess fall risk on admission and as needed Assess: immobility, friction, shearing, incontinence upon admission and as needed Assess impairment of mobility on admission  and as needed per policy Assess personal safety and home safety (as indicated) on admission and as needed Notes: ` Nutrition Nursing Diagnoses: Imbalanced nutrition Impaired glucose control: actual or potential Goals: Patient/caregiver agrees to and verbalizes understanding of need to use nutritional supplements and/or vitamins as prescribed Date Initiated: 05/12/2018 Target Resolution Date: 09/12/2018 Goal Status: Active Patient/caregiver will maintain therapeutic glucose control Date Initiated: 05/12/2018 Target Resolution Date: 08/15/2018 Goal Status: Active Interventions: Assess patient nutrition upon admission and as needed per policy Provide education on elevated blood sugars and impact on wound healing Provide education on nutrition Notes: BERNEICE, ZETTLEMOYER ([id number]) Orientation to the Wound Care Program Nursing Diagnoses: Knowledge deficit related to the wound healing center program Goals: Patient/caregiver will verbalize understanding of the Wound Healing Center Program Date Initiated: 05/12/2018 Target Resolution Date: 06/13/2018 Goal Status: Active Interventions: Provide education on orientation to the wound center Notes: ` Wound/Skin Impairment Nursing Diagnoses: Impaired tissue integrity Knowledge deficit related to ulceration/compromised skin  integrity Goals: Ulcer/skin breakdown will have a volume reduction of 80% by week 12 Date Initiated: 05/12/2018 Target Resolution Date: 09/05/2018 Goal Status: Active Interventions: Assess patient/caregiver ability to perform ulcer/skin care regimen upon admission and as needed Assess ulceration(s) every visit Notes: Electronic Signature(s) Signed: 07/21/2018 5:26:03 PM By: [name] Entered By: [name] on 07/21/2018 11:40:33 Tool, [name] ([id number]) -------------------------------------------------------------------------------- Pain Assessment Details Patient Name: Marcia [name] Date of Service:  07/21/2018 11:00 AM Medical Record Number: [id number] Patient Account Number: [id number] Date of Birth/Sex: 04-26-1946 (72 y.o. F) Treating RN: [name] Primary Care Yasamin Karel: [name] Other Clinician: Referring Laikynn Pollio: [name] Treating Shaunita Seney/Extender: [name], HOYT Weeks in [name]: 10 Active Problems Location of Pain Severity and Description of Pain Patient Has Paino Yes Site Locations Rate the pain. Current Pain Level: 4 Pain Management and Medication Current Pain Management: Electronic Signature(s) Signed: 07/21/2018 4:58:41 PM By: [name] RCP, RRT, CHT Signed: 07/21/2018 5:26:03 PM By: [name] Entered By: [name] on 07/21/2018 11:08:06 Berkemeier, [name] ([id number]) -------------------------------------------------------------------------------- Patient/Caregiver Education Details Patient Name: Marcia [name] Date of Service: 07/21/2018 11:00 AM Medical Record Number: [id number] Patient Account Number: [id number] Date of Birth/Gender: [birth year] (72 y.o. F) Treating RN: [name] Primary Care Physician: [name] Other Clinician: Referring Physician: Sherrie [name] Treating Physician/Extender: Skeet Simmer in [name]: 10 Education Assessment Education Provided To: Patient Education Topics Provided Wound/Skin Impairment: Handouts: Other: wound care as ordered Methods: Demonstration, Explain/Verbal Responses: State content correctly Electronic Signature(s) Signed: 07/21/2018 5:26:03 PM By: [name] Entered By: [name] on 07/21/2018 12:41:30 Netto, [name] ([id number]) -------------------------------------------------------------------------------- Wound Assessment Details Patient Name: Marcia [name] Date of Service: 07/21/2018 11:00 AM Medical Record Number: [id number] Patient Account Number: [id number] Date of Birth/Sex: 08/09/1946 (72 y.o. F) Treating RN: [name] Primary Care Jewelene Mairena: [name] Other Clinician: Referring Charae Depaolis: [name] Treating Rodd Heft/Extender: [name], HOYT Weeks in [name]: 10 Wound Status Wound Number: 3 Primary Etiology: Vasculitis Wound Location: Right Lower Leg - Posterior Secondary Diabetic Wound/Ulcer of the Lower Etiology: Extremity Wounding Event: Gradually Appeared Wound Status: Open Date Acquired: 03/12/2018 Comorbid History: Hypertension, Type II Diabetes, Weeks Of [name]: 10 Neuropathy Clustered Wound: No Photos Photo Uploaded By: [name] on 07/21/2018 11:25:56 Wound Measurements Length: (cm) 1.4 Width: (cm) 0.8 Depth: (cm) 0.1 Area: (cm) 0.88 Volume: (cm) 0.088 % Reduction in Area: 17% % Reduction in Volume: 17% Epithelialization: None Tunneling: No Undermining: No Wound Description Full Thickness Without Exposed Support Classification: Structures Wound Margin: Flat and Intact Exudate Small Amount: Exudate Type: Serous Exudate Color: amber Foul Odor After Cleansing: No Slough/Fibrino Yes Wound Bed Granulation Amount: Small (1-33%) Exposed Structure Granulation Quality: Pink, Pale Fascia Exposed: No Necrotic Amount: Medium (34-66%) Fat Layer (Subcutaneous Tissue) Exposed: Yes Necrotic Quality: Adherent Slough Tendon Exposed: No Muscle Exposed: No Joint Exposed: No Bone Exposed: No Wilkie, Etrulia A. ([id number]) Periwound Skin Texture Texture Color No Abnormalities Noted: No No Abnormalities Noted: No Callus: No Atrophie Blanche: No Crepitus: No Cyanosis: No Excoriation: No Ecchymosis: No Induration: No Erythema: No Rash: No Hemosiderin Staining: No Scarring: No Mottled: No Pallor: No Moisture Rubor: No No Abnormalities Noted: No Dry / Scaly: No Temperature / Pain Maceration: No Temperature: No Abnormality Wound Preparation Ulcer Cleansing: Rinsed/Irrigated with Saline Topical Anesthetic Applied: Other: lidocaine 4%, Electronic  Signature(s) Signed: 07/21/2018 3:28:14 PM By: [name] Entered By: [name] on 07/21/2018 11:17:50 Niziolek, Amaira AMarland Kitchen ([id number]) -------------------------------------------------------------------------------- Wound Assessment Details Patient Name: Marcia [name]  Date of Service: 07/21/2018 11:00 AM Medical Record Number: [id number] Patient Account Number: [id number] Date of Birth/Sex: [birth year] (72 y.o. F) Treating RN: [name] Primary Care Harriett Azar: [name] Other Clinician: Referring Paislee Szatkowski: [name] Treating Sanaiyah Kirchhoff/Extender: [name], HOYT Weeks in [name]: 10 Wound Status Wound Number: 4 Primary Etiology: Vasculitis Wound Location: Right Malleolus - Medial Secondary Diabetic Wound/Ulcer of the Lower Etiology: Extremity Wounding Event: Gradually Appeared Wound Status: Open Date Acquired: 03/12/2018 Comorbid History: Hypertension, Type II Diabetes, Weeks Of [name]: 10 Neuropathy Clustered Wound: Yes Photos Photo Uploaded By: [name] on 07/21/2018 11:25:57 Wound Measurements Length: (cm) 0.8 Width: (cm) 1 Depth: (cm) 0.1 Area: (cm) 0.628 Volume: (cm) 0.063 % Reduction in Area: 96.5% % Reduction in Volume: 98.3% Epithelialization: Small (1-33%) Tunneling: No Undermining: No Wound Description Full Thickness Without Exposed Support Foul O Classification: Structures Slough Wound Margin: Flat and Intact Exudate None Present Amount: dor After Cleansing: No /Fibrino No Wound Bed Granulation Amount: None Present (0%) Exposed Structure Necrotic Amount: Small (1-33%) Fascia Exposed: No Necrotic Quality: Adherent Slough Fat Layer (Subcutaneous Tissue) Exposed: Yes Tendon Exposed: No Muscle Exposed: No Joint Exposed: No Bone Exposed: No Periwound Skin Texture Texture Color Sangiovanni, Malaiah A. ([id number]) No Abnormalities Noted: No No Abnormalities Noted: No Callus: No Atrophie Blanche: No Crepitus: No Cyanosis: No Excoriation:  No Ecchymosis: No Induration: No Erythema: No Rash: No Hemosiderin Staining: No Scarring: No Mottled: No Pallor: No Moisture Rubor: Yes No Abnormalities Noted: No Dry / Scaly: No Temperature / Pain Maceration: No Temperature: No Abnormality Wound Preparation Ulcer Cleansing: Rinsed/Irrigated with Saline Topical Anesthetic Applied: Other: lidocaine 4%, Electronic Signature(s) Signed: 07/21/2018 3:28:14 PM By: [name] Entered By: [name] on 07/21/2018 11:17:34 Madero, Janayla AMarland Kitchen ([id number]) -------------------------------------------------------------------------------- Wound Assessment Details Patient Name: Marcia [name] Date of Service: 07/21/2018 11:00 AM Medical Record Number: [id number] Patient Account Number: [id number] Date of Birth/Sex: 1946/08/22 (72 y.o. F) Treating RN: [name] Primary Care Ellenie Salome: [name] Other Clinician: Referring Shandreka Dante: [name] Treating Zahara Rembert/Extender: [name], HOYT Weeks in [name]: 10 Wound Status Wound Number: 5 Primary Etiology: Diabetic Wound/Ulcer of the Lower Extremity Wound Location: Right Lower Leg - Lateral, Distal Wound Status: Open Wounding Event: Gradually Appeared Comorbid Hypertension, Type II Diabetes, Date Acquired: 06/28/2018 History: Neuropathy Weeks Of [name]: 3 Clustered Wound: Yes Photos Photo Uploaded By: [name] on 07/21/2018 11:26:26 Wound Measurements Length: (cm) 1.7 % Red Width: (cm) 0.8 % Red Depth: (cm) 0.1 Epith Clustered Quantity: 4 Tunne Area: (cm) 1.068 Unde Volume: (cm) 0.107 uction in Area: 94.6% uction in Volume: 94.5% elialization: Medium (34-66%) ling: No rmining: No Wound Description Classification: Grade 1 Wound Margin: Indistinct, nonvisible Exudate Amount: Medium Exudate Type: Serous Exudate Color: amber Foul Odor After Cleansing: No Slough/Fibrino No Wound Bed Granulation Amount: None Present (0%) Exposed Structure Necrotic Amount: Small  (1-33%) Fascia Exposed: No Necrotic Quality: Eschar, Adherent Slough Fat Layer (Subcutaneous Tissue) Exposed: Yes Tendon Exposed: No Muscle Exposed: No Joint Exposed: No Bone Exposed: No Periwound Skin Texture Gsell, Gricelda A. ([id number]) Texture Color No Abnormalities Noted: No No Abnormalities Noted: No Callus: No Atrophie Blanche: No Crepitus: No Cyanosis: No Excoriation: No Ecchymosis: No Induration: No Erythema: No Rash: No Hemosiderin Staining: No Scarring: No Mottled: No Pallor: No Moisture Rubor: Yes No Abnormalities Noted: No Dry / Scaly: Yes Temperature / Pain Maceration: No Temperature: No Abnormality Wound Preparation Ulcer Cleansing: Rinsed/Irrigated with Saline Topical Anesthetic Applied: Other: lidocaine 4%, Electronic Signature(s) Signed: 07/21/2018 3:28:14 PM By: [name] Entered By:  [name] on 07/21/2018 11:19:55 Burkhalter, Johnella AMarland Kitchen ([id number]) -------------------------------------------------------------------------------- Vitals Details Patient Name: SIENA, MUNSTERMAN A. Date of Service: 07/21/2018 11:00 AM Medical Record Number: [id number] Patient Account Number: [id number] Date of Birth/Sex: [birth year] (72 y.o. F) Treating RN: [name] Primary Care Ellanore Vanhook: [name] Other Clinician: Referring Parlee Amescua: [name] Treating Daven Montz/Extender: [name], HOYT Weeks in [name]: 10 Vital Signs Time Taken: 11:07 Temperature (F): 98.0 Height (in): 70 Pulse (bpm): 67 Weight (lbs): 193 Respiratory Rate (breaths/min): 16 Body Mass Index (BMI): 27.7 Blood Pressure (mmHg): 141/54 Reference Range: 80 - 120 mg / dl Electronic Signature(s) Signed: 07/21/2018 4:58:41 PM By: [name] RCP, RRT, CHT Entered By: [name] on 07/21/2018 11:10:59

## 2018-08-04 ENCOUNTER — Encounter: Payer: Medicare HMO | Admitting: Physician Assistant

## 2018-08-04 DIAGNOSIS — E11622 Type 2 diabetes mellitus with other skin ulcer: Secondary | ICD-10-CM | POA: Diagnosis not present

## 2018-08-06 NOTE — Progress Notes (Signed)
MAHKENZIE, LYDEN ([id number]) Visit Report for 08/04/2018 Chief Complaint Document Details Patient Name: [name], [name]. Date of Service: 08/04/2018 9:30 AM Medical Record Number: [id number] Patient Account Number: [id number] Date of Birth/Sex: January 17, 1946 (72 y.o. F) Treating RN: Curtis Sites Primary Care Provider: Sherrie Mustache Other Clinician: Referring Provider: Sherrie Mustache Treating Provider/Extender: Skeet Simmer in Treatment: 12 Information Obtained from: Patient Chief Complaint RLE wounds Electronic Signature(s) Signed: 08/05/2018 1:21:52 AM By: Lenda Kelp PA-C Entered By: Lenda Kelp on 08/04/2018 09:54:57 Herdt, [name] ([id number]) -------------------------------------------------------------------------------- HPI Details Patient Name: [name]. Date of Service: 08/04/2018 9:30 AM Medical Record Number: [id number] Patient Account Number: [id number] Date of Birth/Sex: 1945/12/18 (72 y.o. F) Treating RN: Curtis Sites Primary Care Provider: Sherrie Mustache Other Clinician: Referring Provider: Sherrie Mustache Treating Provider/Extender: Linwood Dibbles, Mercy Malena Weeks in Treatment: 12 History of Present Illness HPI Description: 05/12/18-She is seen in initial evaluation for multiple open areas to the right lower extremity. She was recently hospitalized (7/30-8/2) with cellulitis of the right leg, failed outpatient therapy with Keflex. While hospitalized she was treated with IV vancomycin and cefepime; right foot x-ray was negative for osteomyelitis. She admits to intermittent pain, improved since hospitalization. She was discharged on doxycycline which she should complete with the next 48 hours. 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 She has had lower extremity ulcers with prolonged healing times. She has [name] remote history of following with vascular medicine for venous reflux, she does not tolerate wearing compression stockings d/t  skin sensitivities and "burning" sensation. In office ABI are elevated but she has strongly palpable pulses. We will initiate hydrofera blue and ace wrap compression. She is [name] diabetic with A1c 6.9 while hospitalized. 05/19/18- She is seen in follow up evaluation for RLE wounds; the lateral area is healed. She did tolerate ace wrap for compression, but experienced increased xerosis. We will hold off on ace wrap and increase lachydrin to twice daily. She c/o pain to the medial cluster of wounds, there is no apparent infection and will initiate steroid cream. She will follow up next week 05/26/18 on evaluation today patient appears to actually be having some issues currently with pain. She has been utilizing the dressings as previously recommended for the lower extremities. With that being said she does appear to have [name] little bit of [name] rash she states that anytime it he's in for anything else touches her skin that she will often develop [name] rash. She's not even sure that should be able to tolerate [name] compression wrap due to the fact that again she would be concerned about even the cotton layer being in contact with her skin which overtime will cause [name] burning sensation. She has seen dermatology concerning this unfortunately they really did not have [name] good answer for her as to why this is occurring. Fortunately there is no evidence of infection at this time. 05/29/18 on evaluation today patient was actually supposed to be back in the office in order to have [name] dressings/wrap change is [name] nurse visit. With that being said she was having significant discomfort with want to touch around the wound and. It green discharge. Subsequently the concern was that she had an infection so we did convert her to [name] physician visit instead of just [name] nurse visit. Subsequently upon evaluation she does seem to have purulent discharge along with evidence of cellulitis extending up the leg which is definitely worse than when I saw  her  earlier in the week. Fortunately there does not appear to be any evidence of systemic infection at this point. No fevers, chills, nausea, or vomiting noted at this time. 06/02/18 on evaluation today the patient appears to be doing better in regard to the overall appearance in regard to the wound bed. She still does have slough covering the surface of the wounds and due to pain really I do not think sharp debridement is going to be advisable at this point. Nonetheless I do feel like that she is definitely showing signs of improvement compared to her previous evaluation. Her pain level is still rather high she tells me at times 8-9/10. Unfortunately she is having [name] difficult time getting into pain management she states that the one that we referred her to stated it would be at least two weeks before they can get her in. 06/12/18 on evaluation today patient actually appears to be doing much better in regard to her lower extremity wounds. In fact there does not appear to be any evidence of infection at this time I do believe the Cipro has been effective her pain is also greatly improved compared to previous. Nonetheless she does have some Slough noted that is gonna require debridement. Patient's MRI was reviewed and revealed that she had no evidence of osteomyelitis, septic arthritis, or absence. She did have blisters noted on the surface of the foot which again visually we see today as well. 06/16/18 on evaluation today patient actually appears to be doing fairly well in regard to her lower extremity ulcerations. With that being said she does have issues at this point with discomfort although she did see the pain management physician this morning he did give her medication to help in this regard. She tells me that after I debrided her wound last week that she actually had significant pain to the point that she ended up having [name] difficult time even driving to get home. Obviously when she left the clinic  she was not in that much pain this somewhat surprises me especially since we put lidocaine on her wounds prior to applying the dressings and the dressing we were using was Prisma which is not notorious for causing discomfort and Musso, Tamsen [name]. ([id number]) burning. Nonetheless she did obviously experience this and she tells me that she set intermittent spells where this occurred in the week since I last saw her. Nonetheless she has been given the pain medications necessary in order to keep things under control at this point. Obviously this is short term but nonetheless I think will be helpful in getting the wounds to heal so that we can appropriately debrided do what we need to do. 06/23/18 on evaluation today patient actually appears to be doing significantly better in regard to her right lower extremity ulcerations. In fact she has made dramatic improvement pretty much all locations which is great news. Fortunately there's no evidence of infection at this time. Her irritation and inflammation also is better than I do believe that the treatment utilizing the Santyl has been beneficial over the past week. I'm not even sure that we need that any further. 06/30/18 on evaluation today patient actually is doing worse compared to her previous evaluation. Unfortunately she's been having increased drainage, pain, and overall I feel like this is likely due to infection based on the appearance of her wound today. In general the patient seems to have digressed although not really back to where she started although definitely not as well she's been  doing in the past couple of weeks. She's not having green drainage like she did previous but she is having [name] lot of drainage to the point that she's actually dripping from the posterior portion of her leg at this time. 07/07/18 on evaluation today patient actually appears to be doing rather well in regard to her right lower extremity ulcers. They also do measuring  smaller which is good news she has [name] lot of dry and cracked skin which is gonna make her more prone to reinfection we need to work on this. Nonetheless I do believe that in general the patient has been doing well overall with her current dressing changes and I do believe the doxycycline was beneficial she did have [name] Staphylococcus aureus infection fortunately this was not MRSA. The doxycycline also is appropriate for this infection. 07/21/18 on evaluation today patient's wound bed actually shows evidence of good improvement. This is really in regard to all locations. There was some dried dressing/eschar covering several of the areas but fortunately she's not having the amount of pain that she previously was experiencing. No fevers, chills, nausea, or vomiting noted at this time. 08/04/18 on evaluation today patient appears to be doing very well in regard to her right lower extremity ulcers. She's been tolerating the dressing changes without complication. Fortunately there does not appear to be any evidence of infection at this time which is excellent news. Overall very pleased with the progress that has been made. The patient is having much less pain she's even been able to cut back [name] little bit of the pain medication she was taking roughly 3 [name] day on [name] regular basis she struck back to two and still seems to be doing well. Overall that is great news. Electronic Signature(s) Signed: 08/05/2018 1:21:52 AM By: Lenda Kelp PA-C Entered By: Lenda Kelp on 08/04/2018 10:30:17 Hinds, [name] ([id number]) -------------------------------------------------------------------------------- Physical Exam Details Patient Name: [name]. Date of Service: 08/04/2018 9:30 AM Medical Record Number: [id number] Patient Account Number: [id number] Date of Birth/Sex: 30-Oct-1945 (72 y.o. F) Treating RN: Curtis Sites Primary Care Provider: Sherrie Mustache Other Clinician: Referring Provider: Sherrie Mustache Treating Provider/Extender: Linwood Dibbles, Justino Boze Weeks in Treatment: 12 Constitutional Well-nourished and well-hydrated in no acute distress. Respiratory normal breathing without difficulty. clear to auscultation bilaterally. Cardiovascular regular rate and rhythm with normal S1, S2. 1+ pitting edema of the bilateral lower extremities. Psychiatric this patient is able to make decisions and demonstrates good insight into disease process. Alert and Oriented x 3. pleasant and cooperative. Notes Patient's wound bed currently did not require any sharp debridement at this point. She is tolerating the dressing changes without complication and is very close to closure. Overall the two remaining areas that are still open show signs of dramatic improvement. Electronic Signature(s) Signed: 08/05/2018 1:21:52 AM By: Lenda Kelp PA-C Entered By: Lenda Kelp on 08/04/2018 10:30:54 Ehly, [name] ([id number]) -------------------------------------------------------------------------------- Physician Orders Details Patient Name: [name]. Date of Service: 08/04/2018 9:30 AM Medical Record Number: [id number] Patient Account Number: [id number] Date of Birth/Sex: [birth year] (72 y.o. 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 Wound #5 Right,Distal,Lateral Lower Leg o May Shower, gently pat wound dry prior to applying new  dressing. Skin Barriers/Peri-Wound Care Wound #3 Right,Posterior Lower Leg o Moisturizing lotion Wound #5 Right,Distal,Lateral Lower Leg o Moisturizing lotion Primary Wound Dressing Wound #3 Right,Posterior Lower Leg o Silver Alginate Wound #5 Right,Distal,Lateral Lower Leg o Silver Alginate Secondary Dressing Wound #3 Right,Posterior Lower Leg o ABD and Kerlix/Conform Wound #5 Right,Distal,Lateral Lower Leg o ABD and Kerlix/Conform Dressing Change Frequency Wound #3 Right,Posterior Lower Leg o Change dressing every day. Wound #5 Right,Distal,Lateral Lower Leg o Change dressing every day. KAGAN, HIETPAS ([id number]) Follow-up Appointments Wound #3 Right,Posterior Lower Leg o Return Appointment in 2 weeks. Wound #5 Right,Distal,Lateral Lower Leg o Return Appointment in 2 weeks. Edema Control Wound #3 Right,Posterior Lower Leg o Elevate legs to the level of the heart and pump ankles as often as possible Wound #5 Right,Distal,Lateral Lower Leg o Elevate legs to the level of the heart and pump ankles as often as possible Electronic Signature(s) Signed: 08/04/2018 5:02:25 PM By: Curtis Sites Signed: 08/05/2018 1:21:52 AM By: Lenda Kelp PA-C Entered By: Curtis Sites on 08/04/2018 10:25:07 Nong, [name] ([id number]) -------------------------------------------------------------------------------- Problem List Details Patient Name: [name]. Date of Service: 08/04/2018 9:30 AM Medical Record Number: [id number] Patient Account Number: [id number] Date of Birth/Sex: October 30, 1945 (72 y.o. 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 05/12/2018 No Yes L97.212 Non-pressure chronic ulcer of  right calf with fat layer exposed 05/12/2018 No Yes L97.312 Non-pressure chronic ulcer of right ankle with fat layer 05/12/2018 No Yes exposed E11.622 Type 2 diabetes mellitus with other skin ulcer 05/12/2018 No Yes Inactive Problems Resolved Problems Electronic Signature(s) Signed: 08/05/2018 1:21:52 AM By: Lenda Kelp PA-C Entered By: Lenda Kelp on 08/04/2018 09:54:52 Fecteau, Jahniyah AMarland Kitchen ([id number]) -------------------------------------------------------------------------------- Progress Note Details Patient Name: [name]. Date of Service: 08/04/2018 9:30 AM Medical Record Number: [id number] Patient Account Number: [id number] Date of Birth/Sex: [birth year] (72 y.o. F) Treating RN: Curtis Sites Primary Care Provider: Sherrie Mustache Other Clinician: Referring Provider: Sherrie Mustache Treating Provider/Extender: Linwood Dibbles, Maciah Feeback Weeks in Treatment: 12 Subjective Chief Complaint Information obtained from Patient RLE wounds History of Present Illness (HPI) 05/12/18-She is seen in initial evaluation for multiple open areas to the right lower extremity. She was recently hospitalized (7/30-8/2) with cellulitis of the right leg, failed outpatient therapy with Keflex. While hospitalized she was treated with IV vancomycin and cefepime; right foot x-ray was negative for osteomyelitis. She admits to intermittent pain, improved since hospitalization. She was discharged on doxycycline which she should complete with the next 48 hours. 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 She has had lower extremity ulcers with prolonged healing times. She has [name] remote history of following with vascular medicine for venous reflux, she does not tolerate wearing compression stockings d/t skin sensitivities and "burning" sensation. In office ABI are elevated but she has strongly palpable pulses. We will initiate hydrofera blue and ace wrap compression. She is [name] diabetic with A1c  6.9 while hospitalized. 05/19/18- She is seen in follow up evaluation for RLE wounds; the lateral area is healed. She did tolerate ace wrap for compression, but experienced increased  xerosis. We will hold off on ace wrap and increase lachydrin to twice daily. She c/o pain to the medial cluster of wounds, there is no apparent infection and will initiate steroid cream. She will follow up next week 05/26/18 on evaluation today patient appears to actually be having some issues currently with pain. She has been utilizing the dressings as previously recommended for the lower extremities. With that being said she does appear to have [name] little bit of [name] rash she states that anytime it he's in for anything else touches her skin that she will often develop [name] rash. She's not even sure that should be able to tolerate [name] compression wrap due to the fact that again she would be concerned about even the cotton layer being in contact with her skin which overtime will cause [name] burning sensation. She has seen dermatology concerning this unfortunately they really did not have [name] good answer for her as to why this is occurring. Fortunately there is no evidence of infection at this time. 05/29/18 on evaluation today patient was actually supposed to be back in the office in order to have [name] dressings/wrap change is [name] nurse visit. With that being said she was having significant discomfort with want to touch around the wound and. It green discharge. Subsequently the concern was that she had an infection so we did convert her to [name] physician visit instead of just [name] nurse visit. Subsequently upon evaluation she does seem to have purulent discharge along with evidence of cellulitis extending up the leg which is definitely worse than when I saw her earlier in the week. Fortunately there does not appear to be any evidence of systemic infection at this point. No fevers, chills, nausea, or vomiting noted at this time. 06/02/18 on evaluation  today the patient appears to be doing better in regard to the overall appearance in regard to the wound bed. She still does have slough covering the surface of the wounds and due to pain really I do not think sharp debridement is going to be advisable at this point. Nonetheless I do feel like that she is definitely showing signs of improvement compared to her previous evaluation. Her pain level is still rather high she tells me at times 8-9/10. Unfortunately she is having [name] difficult time getting into pain management she states that the one that we referred her to stated it would be at least two weeks before they can get her in. 06/12/18 on evaluation today patient actually appears to be doing much better in regard to her lower extremity wounds. In fact there does not appear to be any evidence of infection at this time I do believe the Cipro has been effective her pain is also greatly improved compared to previous. Nonetheless she does have some Slough noted that is gonna require debridement. Patient's MRI was reviewed and revealed that she had no evidence of osteomyelitis, septic arthritis, or absence. She did have blisters noted on the surface of the foot which again visually we see today as well. MARIACELESTE, HERRERA ([id number]) 06/16/18 on evaluation today patient actually appears to be doing fairly well in regard to her lower extremity ulcerations. With that being said she does have issues at this point with discomfort although she did see the pain management physician this morning he did give her medication to help in this regard. She tells me that after I debrided her wound last week that she actually had significant pain to the point that she ended  up having [name] difficult time even driving to get home. Obviously when she left the clinic she was not in that much pain this somewhat surprises me especially since we put lidocaine on her wounds prior to applying the dressings and the dressing we were using  was Prisma which is not notorious for causing discomfort and burning. Nonetheless she did obviously experience this and she tells me that she set intermittent spells where this occurred in the week since I last saw her. Nonetheless she has been given the pain medications necessary in order to keep things under control at this point. Obviously this is short term but nonetheless I think will be helpful in getting the wounds to heal so that we can appropriately debrided do what we need to do. 06/23/18 on evaluation today patient actually appears to be doing significantly better in regard to her right lower extremity ulcerations. In fact she has made dramatic improvement pretty much all locations which is great news. Fortunately there's no evidence of infection at this time. Her irritation and inflammation also is better than I do believe that the treatment utilizing the Santyl has been beneficial over the past week. I'm not even sure that we need that any further. 06/30/18 on evaluation today patient actually is doing worse compared to her previous evaluation. Unfortunately she's been having increased drainage, pain, and overall I feel like this is likely due to infection based on the appearance of her wound today. In general the patient seems to have digressed although not really back to where she started although definitely not as well she's been doing in the past couple of weeks. She's not having green drainage like she did previous but she is having [name] lot of drainage to the point that she's actually dripping from the posterior portion of her leg at this time. 07/07/18 on evaluation today patient actually appears to be doing rather well in regard to her right lower extremity ulcers. They also do measuring smaller which is good news she has [name] lot of dry and cracked skin which is gonna make her more prone to reinfection we need to work on this. Nonetheless I do believe that in general the patient has been  doing well overall with her current dressing changes and I do believe the doxycycline was beneficial she did have [name] Staphylococcus aureus infection fortunately this was not MRSA. The doxycycline also is appropriate for this infection. 07/21/18 on evaluation today patient's wound bed actually shows evidence of good improvement. This is really in regard to all locations. There was some dried dressing/eschar covering several of the areas but fortunately she's not having the amount of pain that she previously was experiencing. No fevers, chills, nausea, or vomiting noted at this time. 08/04/18 on evaluation today patient appears to be doing very well in regard to her right lower extremity ulcers. She's been tolerating the dressing changes without complication. Fortunately there does not appear to be any evidence of infection at this time which is excellent news. Overall very pleased with the progress that has been made. The patient is having much less pain she's even been able to cut back [name] little bit of the pain medication she was taking roughly 3 [name] day on [name] regular basis she struck back to two and still seems to be doing well. Overall that is great news. Patient History Information obtained from Patient. Family History Cancer - Siblings, Diabetes - Mother,Father,Siblings, Heart Disease - Father, Hypertension - Father, Stroke - Father,Mother, No  family history of Kidney Disease, Lung Disease, Seizures, Thyroid Problems, Tuberculosis. Social History Former smoker - 10-15 years quit, Marital Status - Married, Alcohol Use - Never, Drug Use - No History, Caffeine Use - Daily. Medical And Surgical History Notes Constitutional Symptoms (General Health) vitamin d deficiency Respiratory nodule of lung Cardiovascular dyspnea 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, AVV Musculoskeletal arthritis Review of Systems (ROS) Schreffler, Taraoluwa [name]. ([id number]) Constitutional Symptoms (General Health) Denies  complaints or symptoms of Fever, Chills. Respiratory The patient has no complaints or symptoms. Cardiovascular Complains or has symptoms of LE edema. Psychiatric The patient has no complaints or symptoms. Objective Constitutional Well-nourished and well-hydrated in no acute distress. Vitals Time Taken: 9:40 AM, Height: 70 in, Weight: 193 lbs, BMI: 27.7, Temperature: 98.1 F, Pulse: 61 bpm, Respiratory Rate: 16 breaths/min, Blood Pressure: 141/50 mmHg. Respiratory normal breathing without difficulty. clear to auscultation bilaterally. Cardiovascular regular rate and rhythm with normal S1, S2. 1+ pitting edema of the bilateral lower extremities. Psychiatric this patient is able to make decisions and demonstrates good insight into disease process. Alert and Oriented x 3. pleasant and cooperative. General Notes: Patient's wound bed currently did not require any sharp debridement at this point. She is tolerating the dressing changes without complication and is very close to closure. Overall the two remaining areas that are still open show signs of dramatic improvement. Integumentary (Hair, Skin) Wound #3 status is Open. Original cause of wound was Gradually Appeared. The wound is located on the Right,Posterior Lower Leg. The wound measures 0.9cm length x 0.5cm width x 0.1cm depth; 0.353cm^2 area and 0.035cm^3 volume. Wound #4 status is Healed - Epithelialized. Original cause of wound was Gradually Appeared. The wound is located on the Right,Medial Malleolus. The wound measures 0cm length x 0cm width x 0cm depth; 0cm^2 area and 0cm^3 volume. Wound #5 status is Open. Original cause of wound was Gradually Appeared. The wound is located on the Right,Distal,Lateral Lower Leg. The wound measures 1.6cm length x 0.1cm width x 0.1cm depth; 0.126cm^2 area and 0.013cm^3 volume. Assessment Active Problems Roehrs, Fancy [name]. 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 Wound #5 Right,Distal,Lateral Lower Leg: May Shower, gently pat wound dry prior to applying new dressing. Skin Barriers/Peri-Wound Care: Wound #3 Right,Posterior Lower Leg: Moisturizing lotion Wound #5 Right,Distal,Lateral Lower Leg: Moisturizing lotion Primary Wound Dressing: Wound #3 Right,Posterior Lower Leg: Silver Alginate Wound #5 Right,Distal,Lateral Lower Leg: Silver Alginate Secondary Dressing: Wound #3 Right,Posterior Lower Leg: ABD and Kerlix/Conform Wound #5 Right,Distal,Lateral Lower Leg: ABD and Kerlix/Conform Dressing Change Frequency: Wound #3 Right,Posterior Lower Leg: Change dressing every day. Wound #5 Right,Distal,Lateral Lower Leg: Change dressing every day. Follow-up Appointments: Wound #3 Right,Posterior Lower Leg: Return Appointment in 2 weeks. Wound #5 Right,Distal,Lateral Lower Leg: Return Appointment in 2 weeks. Edema Control: Wound #3 Right,Posterior Lower Leg: Elevate legs to the level of the heart and pump ankles as often as possible Wound #5 Right,Distal,Lateral Lower Leg: Elevate legs to the level of the heart and pump ankles as often as possible The biggest issue I see this point she continues to have some trouble with swelling which has been an ongoing issue and will continue to be an issue I'm afraid as well. For that reason I discussed with  the patient that I still believe she may benefit from the FarrowWrap 4000 compression wraps. We did order this for her although she did not get it explained the cost around $77 unfortunately insurance could not pay for it. With that being said I do believe that she would benefit from this and even now as far as getting the wounds to close I  think she would find it of benefit. She's going to think about going ahead and ordering this for the right lower extremity we will subsequently see her back for reevaluation in two weeks time. If anything changes the meantime shall contact the office and let us know. ARRIONNA, SERENA [name]. ([id number]) Please see above for specific wound care orders. We will see patient for re-evaluation in 2 week(s) here in the clinic. If anything worsens or changes patient will contact our office for additional recommendations. Electronic Signature(s) Signed: 08/05/2018 1:21:52 AM By: Lenda Kelp PA-C Entered By: Lenda Kelp on 08/04/2018 10:32:07 Weldy, [name] ([id number]) -------------------------------------------------------------------------------- ROS/PFSH Details Patient Name: [name]. Date of Service: 08/04/2018 9:30 AM Medical Record Number: [id number] Patient Account Number: [id number] Date of Birth/Sex: [birth year] (72 y.o. F) Treating RN: Curtis Sites Primary Care Provider: Sherrie Mustache Other Clinician: Referring Provider: Sherrie Mustache Treating Provider/Extender: Linwood Dibbles, Hollye Pritt Weeks in Treatment: 12 Information Obtained From Patient Wound History Do you currently have one or more open woundso Yes How many open wounds do you currently haveo 5 Approximately how long have you had your woundso 6 How have you been treating your wound(s) until nowo abx Has your wound(s) ever healed and then re-openedo No Have you had any lab work done in the past montho Yes Who ordered the lab work doneo hospital Have you tested positive for an antibiotic resistant organism (MRSA, VRE)o No Have you tested positive for osteomyelitis (bone infection)o No Have you had any tests for circulation on your legso Yes Constitutional Symptoms (General Health) Complaints and Symptoms: Negative for: Fever; Chills Medical History: Past Medical History Notes: vitamin d  deficiency Cardiovascular Complaints and Symptoms: Positive for: LE edema Medical History: Positive for: Hypertension Negative for: Angina; Arrhythmia; Congestive Heart Failure; Coronary Artery Disease; Deep Vein Thrombosis; Hypotension; Myocardial Infarction; Peripheral Arterial Disease; Peripheral Venous Disease; Phlebitis; Vasculitis Past Medical History Notes: dyspnea 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, [place] Medical History: Negative for: Cataracts; Glaucoma; Optic Neuritis Ear/Nose/Mouth/Throat Medical History: Negative for: Chronic sinus problems/congestion; Middle ear problems Hematologic/Lymphatic ZARINAH, OVIATT [name]. ([id number]) Medical History: Negative for: Anemia; Hemophilia; Human Immunodeficiency Virus; Lymphedema; Sickle Cell Disease Respiratory Complaints and Symptoms: No Complaints or Symptoms Medical History: Negative for: Aspiration; Asthma; Chronic Obstructive Pulmonary Disease (COPD); Pneumothorax; Sleep Apnea; Tuberculosis Past Medical History Notes: nodule of lung Gastrointestinal Medical History: Negative for: Cirrhosis ; Colitis; Crohnos; Hepatitis [name]; Hepatitis B; Hepatitis C Endocrine Medical History: Positive for: Type II Diabetes Time with diabetes: 10 years Treated with: Oral agents Blood sugar tested every day: Yes Tested : every mornimg Genitourinary Medical History: Negative for: End Stage Renal Disease Immunological Medical History: Negative for: Lupus Erythematosus; Raynaudos; Scleroderma Integumentary (Skin) Medical History: Negative for: History of Burn; History of pressure wounds Musculoskeletal Medical History: Negative for: Gout; Rheumatoid Arthritis; Osteoarthritis; Osteomyelitis Past Medical History Notes: arthritis Neurologic Medical History: Positive for: Neuropathy - peripheral Negative for: Dementia; Quadriplegia; Paraplegia; Seizure Disorder Oncologic JRUE, YAMBAO ([id number]) Medical History: Negative  for: Received Chemotherapy; Received Radiation Psychiatric Complaints and Symptoms: No Complaints or Symptoms Medical History: Negative for: Anorexia/bulimia; Confinement  Anxiety Immunizations Pneumococcal Vaccine: Received Pneumococcal Vaccination: Yes Tetanus Vaccine: Last tetanus shot: 05/12/2014 Immunization Notes: plan on flu shot Implantable Devices Family and Social History Cancer: Yes - Siblings; Diabetes: Yes - Mother,Father,Siblings; Heart Disease: Yes - Father; Hypertension: Yes - Father; Kidney Disease: No; Lung Disease: No; Seizures: No; Stroke: Yes - Father,Mother; Thyroid Problems: No; Tuberculosis: No; Former smoker - 10-15 years quit; Marital Status - Married; Alcohol Use: Never; Drug Use: No History; Caffeine Use: Daily; Financial Concerns: No; Food, Clothing or Shelter Needs: No; Support System Lacking: No; Transportation Concerns: No; Advanced Directives: No; Patient does not want information on Advanced Directives; Living Will: No; Medical Power of Attorney: No Physician Affirmation I have reviewed and agree with the above information. Electronic Signature(s) Signed: 08/04/2018 5:02:25 PM By: Curtis Sites Signed: 08/05/2018 1:21:52 AM By: Lenda Kelp PA-C Entered By: Lenda Kelp on 08/04/2018 10:30:35 Hallam, [name] ([id number]) -------------------------------------------------------------------------------- SuperBill Details Patient Name: [name]. Date of Service: 08/04/2018 Medical Record Number: [id number] Patient Account Number: [id number] Date of Birth/Sex: [birth year] (72 y.o. F) Treating RN: Curtis Sites Primary Care Provider: Sherrie Mustache Other Clinician: Referring Provider: Sherrie Mustache Treating Provider/Extender: Linwood Dibbles, Shaquan Puerta Weeks in Treatment: 12 Diagnosis Coding ICD-10 Codes Code Description 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 L97.212 Non-pressure chronic ulcer of right calf with fat layer exposed L97.312 Non-pressure chronic  ulcer of right ankle with fat layer exposed E11.622 Type 2 diabetes mellitus with other skin ulcer Facility Procedures CPT4 Code: 78295621 Description: 99214 - WOUND CARE VISIT-LEV 4 EST PT Modifier: Quantity: 1 Physician Procedures CPT4 Code: [id number] Description: 99214 - WC PHYS LEVEL 4 - EST PT ICD-10 Diagnosis Description 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

## 2018-08-08 NOTE — Progress Notes (Addendum)
[name], [name] ([id number]) Visit Report for 08/04/2018 Arrival Information Details Patient Name: [name], [name] Date of Service: 08/04/2018 9:30 AM Medical Record Number: [id number] Patient Account Number: [id number] Date of Birth/Sex: 01-01-1946 (72 y.o. F) Treating RN: Curtis Sites Primary Care Katilyn Miltenberger: Sherrie Mustache Other Clinician: Referring Oma Alpert: Sherrie Mustache Treating Haru Shaff/Extender: Linwood Dibbles, HOYT Weeks in Treatment: 12 Visit Information History Since Last Visit Added or deleted any medications: No Patient Arrived: Ambulatory Any new allergies or adverse reactions: No Arrival Time: 09:37 Had a fall or experienced change in No Accompanied By: self activities of daily living that may affect Transfer Assistance: None risk of falls: Patient Identification Verified: Yes Signs or symptoms of abuse/neglect since last visito No Secondary Verification Process Completed: Yes Hospitalized since last visit: No Patient Requires Transmission-Based No Implantable device outside of the clinic excluding No Precautions: cellular tissue based products placed in the center Patient Has Alerts: Yes since last visit: Patient Alerts: DMII Has Dressing in Place as Prescribed: Yes Pain Present Now: No Electronic Signature(s) Signed: 08/04/2018 2:09:33 PM By: Dayton Martes RCP, RRT, CHT Entered By: Weyman Rodney, Lucio Edward on 08/04/2018 09:41:31 Marzo, [name] ([id number]) -------------------------------------------------------------------------------- Clinic Level of Care Assessment Details Patient Name: Marcia [name] Date of Service: 08/04/2018 9:30 AM Medical Record Number: [id number] Patient Account Number: [id number] Date of Birth/Sex: October 31, 1945 (72 y.o. F) Treating RN: Curtis Sites Primary Care Sherlie Boyum: Sherrie Mustache Other Clinician: Referring Kemi Gell: Sherrie Mustache Treating Kathrina Crosley/Extender: Linwood Dibbles, HOYT Weeks in Treatment: 12 Clinic Level  of Care Assessment Items TOOL 4 Quantity Score []  - Use when only an EandM is performed on FOLLOW-UP visit 0 ASSESSMENTS - Nursing Assessment / Reassessment X - Reassessment of Co-morbidities (includes updates in patient status) 1 10 X- 1 5 Reassessment of Adherence to Treatment Plan ASSESSMENTS - Wound and Skin Assessment / Reassessment []  - Simple Wound Assessment / Reassessment - one wound 0 X- 3 5 Complex Wound Assessment / Reassessment - multiple wounds []  - 0 Dermatologic / Skin Assessment (not related to wound area) ASSESSMENTS - Focused Assessment []  - Circumferential Edema Measurements - multi extremities 0 []  - 0 Nutritional Assessment / Counseling / Intervention X- 1 5 Lower Extremity Assessment (monofilament, tuning fork, pulses) []  - 0 Peripheral Arterial Disease Assessment (using hand held doppler) ASSESSMENTS - Ostomy and/or Continence Assessment and Care []  - Incontinence Assessment and Management 0 []  - 0 Ostomy Care Assessment and Management (repouching, etc.) PROCESS - Coordination of Care X - Simple Patient / Family Education for ongoing care 1 15 []  - 0 Complex (extensive) Patient / Family Education for ongoing care X- 1 10 Staff obtains Chiropractor, Records, Test Results / Process Orders []  - 0 Staff telephones HHA, Nursing Homes / Clarify orders / etc []  - 0 Routine Transfer to another Facility (non-emergent condition) []  - 0 Routine Hospital Admission (non-emergent condition) []  - 0 New Admissions / Manufacturing engineer / Ordering NPWT, Apligraf, etc. []  - 0 Emergency Hospital Admission (emergent condition) X- 1 10 Simple Discharge Coordination [name], [name] ([id number]) []  - 0 Complex (extensive) Discharge Coordination PROCESS - Special Needs []  - Pediatric / Minor Patient Management 0 []  - 0 Isolation Patient Management []  - 0 Hearing / Language / Visual special needs []  - 0 Assessment of Community assistance (transportation, D/C  planning, etc.) []  - 0 Additional assistance / Altered mentation []  - 0 Support Surface(s) Assessment (bed, cushion, seat, etc.) INTERVENTIONS - Wound Cleansing / Measurement []  - Simple Wound Cleansing - one wound  0 X- 3 5 Complex Wound Cleansing - multiple wounds X- 1 5 Wound Imaging (photographs - any number of wounds) []  - 0 Wound Tracing (instead of photographs) []  - 0 Simple Wound Measurement - one wound X- 3 5 Complex Wound Measurement - multiple wounds INTERVENTIONS - Wound Dressings X - Small Wound Dressing one or multiple wounds 3 10 []  - 0 Medium Wound Dressing one or multiple wounds []  - 0 Large Wound Dressing one or multiple wounds []  - 0 Application of Medications - topical []  - 0 Application of Medications - injection INTERVENTIONS - Miscellaneous []  - External ear exam 0 []  - 0 Specimen Collection (cultures, biopsies, blood, body fluids, etc.) []  - 0 Specimen(s) / Culture(s) sent or taken to Lab for analysis []  - 0 Patient Transfer (multiple staff / Nurse, adult / Similar devices) []  - 0 Simple Staple / Suture removal (25 or less) []  - 0 Complex Staple / Suture removal (26 or more) []  - 0 Hypo / Hyperglycemic Management (close monitor of Blood Glucose) []  - 0 Ankle / Brachial Index (ABI) - do not check if billed separately X- 1 5 Vital Signs Laminack, Stephenie A. ([id number]) Has the patient been seen at the hospital within the last three years: Yes Total Score: 140 Level Of Care: New/Established - Level 4 Electronic Signature(s) Signed: 08/04/2018 5:02:25 PM By: Curtis Sites Entered By: Curtis Sites on 08/04/2018 10:26:04 Mangano, [name] ([id number]) -------------------------------------------------------------------------------- Encounter Discharge Information Details Patient Name: Marcia [name] Date of Service: 08/04/2018 9:30 AM Medical Record Number: [id number] Patient Account Number: [id number] Date of Birth/Sex: 07-18-1946 (72 y.o.  F) Treating RN: Curtis Sites Primary Care Modena Bellemare: Sherrie Mustache Other Clinician: Referring Maki Sweetser: Sherrie Mustache Treating [name]/Extender: Skeet Simmer in Treatment: 12 Encounter Discharge Information Items Discharge Condition: Stable Ambulatory Status: Ambulatory Discharge Destination: Home Transportation: Private Auto Accompanied By: self Schedule Follow-up Appointment: Yes Clinical Summary of Care: Electronic Signature(s) Signed: 08/04/2018 5:02:25 PM By: Curtis Sites Entered By: Curtis Sites on 08/04/2018 10:30:21 Ebrahim, [name] ([id number]) -------------------------------------------------------------------------------- Lower Extremity Assessment Details Patient Name: Marcia [name] Date of Service: 08/04/2018 9:30 AM Medical Record Number: [id number] Patient Account Number: [id number] Date of Birth/Sex: Jun 28, 1946 (72 y.o. F) Treating RN: Huel Coventry Primary Care Branch Pacitti: Sherrie Mustache Other Clinician: Referring Cataleia Gade: Sherrie Mustache Treating Kellene Mccleary/Extender: Linwood Dibbles, HOYT Weeks in Treatment: 12 Edema Assessment Assessed: [Left: No] [Right: No] Edema: [Left: N] [Right: o] Calf Left: Right: Point of Measurement: 34 cm From Medial Instep cm cm Ankle Left: Right: Point of Measurement: 12 cm From Medial Instep cm cm Vascular Assessment Pulses: Dorsalis Pedis Palpable: [Right:Yes] Posterior Tibial Extremity colors, hair growth, and conditions: Extremity Color: [Right:Hyperpigmented] Hair Growth on Extremity: [Right:No] Temperature of Extremity: [Right:Warm] Capillary Refill: [Right:< 3 seconds] Toe Nail Assessment Left: Right: Thick: Yes Discolored: No Deformed: No Improper Length and Hygiene: No Electronic Signature(s) Signed: 08/06/2018 7:26:08 AM By: Elliot Gurney, BSN, RN, CWS, Kim RN, BSN Entered By: Elliot Gurney, BSN, RN, CWS, Kim on 08/04/2018 09:49:52 Sevin, [name]  ([id number]) -------------------------------------------------------------------------------- Multi Wound Chart Details Patient Name: Marcia [name] Date of Service: 08/04/2018 9:30 AM Medical Record Number: [id number] Patient Account Number: [id number] Date of Birth/Sex: 26-Jan-1946 (72 y.o. F) Treating RN: Curtis Sites Primary Care Kairee Isa: Sherrie Mustache Other Clinician: Referring Gabby Rackers: Sherrie Mustache Treating Madysun Thall/Extender: Linwood Dibbles, HOYT Weeks in Treatment: 12 Vital Signs Height(in): 70 Pulse(bpm): 61 Weight(lbs): 193 Blood Pressure(mmHg): 141/50 Body Mass Index(BMI): 28 Temperature(F): 98.1 Respiratory Rate 16 (breaths/min): Photos: [3:No Photos] [4:No Photos] [  5:No Photos] Wound Location: [3:Right, Posterior Lower Leg] [4:Right, Medial Malleolus] [5:Right, Distal, Lateral Lower Leg] Wounding Event: [3:Gradually Appeared] [4:Gradually Appeared] [5:Gradually Appeared] Primary Etiology: [3:Vasculitis] [4:Vasculitis] [5:Diabetic Wound/Ulcer of the Lower Extremity] 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] [5:N/A] Date Acquired: [3:03/12/2018] [4:03/12/2018] [5:06/28/2018] Weeks of Treatment: [3:12] [4:12] [5:5] Wound Status: [3:Open] [4:Healed - Epithelialized] [5:Open] Clustered Wound: [3:No] [4:Yes] [5:Yes] Measurements L x W x D [3:0.9x0.5x0.1] [4:0x0x0] [5:1.6x0.1x0.1] (cm) Area (cm) : [3:0.353] [4:0] [5:0.126] Volume (cm) : [3:0.035] [4:0] [5:0.013] % Reduction in Area: [3:66.70%] [4:100.00%] [5:99.40%] % Reduction in Volume: [3:67.00%] [4:100.00%] [5:99.30%] Classification: [3:Full Thickness Without Exposed Support Structures] [4:Full Thickness Without Exposed Support Structures] [5:Grade 1] Periwound Skin Texture: [3:No Abnormalities Noted] [4:No Abnormalities Noted] [5:No Abnormalities Noted] Periwound Skin Moisture: [3:No Abnormalities Noted] [4:No Abnormalities Noted] [5:No  Abnormalities Noted] Periwound Skin Color: [3:No Abnormalities Noted No] [4:No Abnormalities Noted No] [5:No Abnormalities Noted No] Treatment Notes Electronic Signature(s) Signed: 08/04/2018 5:02:25 PM By: Curtis Sites Entered By: Curtis Sites on 08/04/2018 10:22:06 Henningsen, [name] ([id number]) -------------------------------------------------------------------------------- Multi-Disciplinary Care Plan Details Patient Name: Marcia [name] Date of Service: 08/04/2018 9:30 AM Medical Record Number: [id number] Patient Account Number: [id number] Date of Birth/Sex: April 08, 1946 (72 y.o. F) Treating RN: Curtis Sites Primary Care [place] Mccuen: Sherrie Mustache Other Clinician: Referring Sehar Sedano: Sherrie Mustache Treating Betty Daidone/Extender: Linwood Dibbles, HOYT Weeks in Treatment: 12 Active Inactive ` Abuse / Safety / Falls / Self Care Management Nursing Diagnoses: Potential for falls Goals: Patient will not experience any injury related to falls Date Initiated: 05/12/2018 Target Resolution Date: 08/15/2018 Goal Status: Active Interventions: Assess Activities of Daily Living upon admission and as needed Assess fall risk on admission and as needed Assess: immobility, friction, shearing, incontinence upon admission and as needed Assess impairment of mobility on admission and as needed per policy Assess personal safety and home safety (as indicated) on admission and as needed Notes: ` Nutrition Nursing Diagnoses: Imbalanced nutrition Impaired glucose control: actual or potential Goals: Patient/caregiver agrees to and verbalizes understanding of need to use nutritional supplements and/or vitamins as prescribed Date Initiated: 05/12/2018 Target Resolution Date: 09/12/2018 Goal Status: Active Patient/caregiver will maintain therapeutic glucose control Date Initiated: 05/12/2018 Target Resolution Date: 08/15/2018 Goal Status: Active Interventions: Assess patient nutrition upon admission and as needed  per policy Provide education on elevated blood sugars and impact on wound healing Provide education on nutrition Notes: CORITA, ALLINSON ([id number]) Orientation to the Wound Care Program Nursing Diagnoses: Knowledge deficit related to the wound healing center program Goals: Patient/caregiver will verbalize understanding of the Wound Healing Center Program Date Initiated: 05/12/2018 Target Resolution Date: 06/13/2018 Goal Status: Active Interventions: Provide education on orientation to the wound center Notes: ` Wound/Skin Impairment Nursing Diagnoses: Impaired tissue integrity Knowledge deficit related to ulceration/compromised skin integrity Goals: Ulcer/skin breakdown will have a volume reduction of 80% by week 12 Date Initiated: 05/12/2018 Target Resolution Date: 09/05/2018 Goal Status: Active Interventions: Assess patient/caregiver ability to perform ulcer/skin care regimen upon admission and as needed Assess ulceration(s) every visit Notes: Electronic Signature(s) Signed: 08/04/2018 5:02:25 PM By: Curtis Sites Entered By: Curtis Sites on 08/04/2018 10:21:59 Mcquaid, [name] ([id number]) -------------------------------------------------------------------------------- Pain Assessment Details Patient Name: Marcia [name] Date of Service: 08/04/2018 9:30 AM Medical Record Number: [id number] Patient Account Number: [id number] Date of Birth/Sex: [birth year] (72 y.o. F) Treating RN: Curtis Sites Primary Care Natalyia Innes: Sherrie Mustache Other Clinician: Referring Yamel Bale: Sherrie Mustache Treating Meiko Stranahan/Extender: Linwood Dibbles, HOYT Weeks in Treatment: 12 Active Problems  Location of Pain Severity and Description of Pain Patient Has Paino No Site Locations Pain Management and Medication Current Pain Management: Electronic Signature(s) Signed: 08/04/2018 2:09:33 PM By: Sallee Provencal, RRT, CHT Signed: 08/04/2018 5:02:25 PM By: Curtis Sites Entered By:  Dayton Martes on 08/04/2018 09:41:42 Mcmanaman, [name] ([id number]) -------------------------------------------------------------------------------- Patient/Caregiver Education Details Patient Name: Marcia [name] Date of Service: 08/04/2018 9:30 AM Medical Record Number: [id number] Patient Account Number: [id number] Date of Birth/Gender: [birth year] (72 y.o. F) Treating RN: Curtis Sites Primary Care Physician: Sherrie Mustache Other Clinician: Referring Physician: Sherrie Mustache Treating Physician/Extender: Skeet Simmer in Treatment: 12 Education Assessment Education Provided To: Patient Education Topics Provided Wound/Skin Impairment: Handouts: Other: wound care to continue as ordered Methods: Demonstration, Explain/Verbal Responses: State content correctly Electronic Signature(s) Signed: 08/04/2018 5:02:25 PM By: Curtis Sites Entered By: Curtis Sites on 08/04/2018 10:26:24 Stanke, [name] ([id number]) -------------------------------------------------------------------------------- Wound Assessment Details Patient Name: Marcia [name] Date of Service: 08/04/2018 9:30 AM Medical Record Number: [id number] Patient Account Number: [id number] Date of Birth/Sex: September 04, 1946 (72 y.o. F) Treating RN: Huel Coventry Primary Care Ozias Dicenzo: Sherrie Mustache Other Clinician: Referring Shabria Egley: Sherrie Mustache Treating Marco Adelson/Extender: Linwood Dibbles, HOYT Weeks in Treatment: 12 Wound Status Wound Number: 3 Primary Etiology: Vasculitis Wound Location: Right Lower Leg - Posterior Secondary Diabetic Wound/Ulcer of the Lower Etiology: Extremity Wounding Event: Gradually Appeared Wound Status: Open Date Acquired: 03/12/2018 Comorbid History: Hypertension, Type II Diabetes, Weeks Of Treatment: 12 Neuropathy Clustered Wound: No Photos Wound Measurements Length: (cm) 0.9 Width: (cm) 0.5 Depth: (cm) 0.1 Area: (cm) 0.353 Volume: (cm) 0.035 % Reduction in Area: 66.7% %  Reduction in Volume: 67% Epithelialization: None Tunneling: No Undermining: No Wound Description Full Thickness Without Exposed Support Classification: Structures Wound Margin: Flat and Intact Exudate Medium Amount: Exudate Type: Serous Exudate Color: amber Foul Odor After Cleansing: No Slough/Fibrino Yes Wound Bed Granulation Amount: Small (1-33%) Exposed Structure Granulation Quality: Pink, Pale Fascia Exposed: No Necrotic Amount: Medium (34-66%) Fat Layer (Subcutaneous Tissue) Exposed: Yes Necrotic Quality: Adherent Slough Tendon Exposed: No Muscle Exposed: No Joint Exposed: No Bone Exposed: No Periwound Skin Texture Fabry, Kattaleya A. ([id number]) Texture Color No Abnormalities Noted: No No Abnormalities Noted: No Callus: No Atrophie Blanche: No Crepitus: No Cyanosis: No Excoriation: No Ecchymosis: No Induration: No Erythema: No Rash: No Hemosiderin Staining: No Scarring: No Mottled: No Pallor: No Moisture Rubor: No No Abnormalities Noted: No Dry / Scaly: No Temperature / Pain Maceration: No Temperature: No Abnormality Wound Preparation Ulcer Cleansing: Rinsed/Irrigated with Saline Topical Anesthetic Applied: Other: lidocaine 4%, Treatment Notes Wound #3 (Right, Posterior Lower Leg) 1. Cleansed with: Clean wound with Normal Saline 2. Anesthetic Topical Lidocaine 4% cream to wound bed prior to debridement 3. Peri-wound Care: Moisturizing lotion 4. Dressing Applied: Calcium Alginate with Silver 5. Secondary Dressing Applied ABD Pad Kerlix/Conform Notes silvercell conform tape and cotton sleeve Electronic Signature(s) Signed: 08/14/2018 1:00:31 PM By: Curtis Sites Signed: 08/31/2018 7:36:17 AM By: Elliot Gurney, BSN, RN, CWS, Kim RN, BSN Previous Signature: 08/06/2018 7:26:08 AM Version By: Elliot Gurney, BSN, RN, CWS, Kim RN, BSN Entered By: Curtis Sites on 08/14/2018 13:00:31 Nowaczyk, Wilmetta AMarland Kitchen  ([id number]) -------------------------------------------------------------------------------- Wound Assessment Details Patient Name: Marcia [name] Date of Service: 08/04/2018 9:30 AM Medical Record Number: [id number] Patient Account Number: [id number] Date of Birth/Sex: Aug 20, 1946 (72 y.o. F) Treating RN: Huel Coventry Primary Care Elex Mainwaring: Sherrie Mustache Other Clinician: Referring Ralonda Tartt: Sherrie Mustache Treating Satia Winger/Extender: Linwood Dibbles, HOYT Weeks in Treatment: 12 Wound Status Wound Number: 4 Primary Etiology:  Vasculitis Wound Location: Right, Medial Malleolus Secondary Diabetic Wound/Ulcer of the Lower Etiology: Extremity Wounding Event: Gradually Appeared Wound Status: Healed - Epithelialized Date Acquired: 03/12/2018 Weeks Of Treatment: 12 Clustered Wound: Yes Photos Photo Uploaded By: Elliot Gurney, BSN, RN, CWS, Kim on 08/04/2018 16:34:11 Wound Measurements Length: (cm) 0 Width: (cm) 0 Depth: (cm) 0 Area: (cm) 0 Volume: (cm) 0 % Reduction in Area: 100% % Reduction in Volume: 100% Wound Description Full Thickness Without Exposed Support Classification: Structures Periwound Skin Texture Texture Color No Abnormalities Noted: No No Abnormalities Noted: No Moisture No Abnormalities Noted: No Electronic Signature(s) Signed: 08/06/2018 7:26:08 AM By: Elliot Gurney, BSN, RN, CWS, Kim RN, BSN Entered By: Elliot Gurney, BSN, RN, CWS, Kim on 08/04/2018 09:49:00 Giarratano, [name] ([id number]) -------------------------------------------------------------------------------- Wound Assessment Details Patient Name: Marcia [name] Date of Service: 08/04/2018 9:30 AM Medical Record Number: [id number] Patient Account Number: [id number] Date of Birth/Sex: 1946-02-17 (72 y.o. F) Treating RN: Huel Coventry Primary Care Monterrius Cardosa: Sherrie Mustache Other Clinician: Referring Kourtney Terriquez: Sherrie Mustache Treating Kassidee Narciso/Extender: Linwood Dibbles, HOYT Weeks in Treatment: 12 Wound Status Wound Number: 5 Primary  Etiology: Diabetic Wound/Ulcer of the Lower Extremity Wound Location: Right Lower Leg - Lateral, Distal Wound Status: Open Wounding Event: Gradually Appeared Comorbid Hypertension, Type II Diabetes, Date Acquired: 06/28/2018 History: Neuropathy Weeks Of Treatment: 5 Clustered Wound: Yes Photos Wound Measurements Length: (cm) 1.6 % Redu Width: (cm) 0.1 % Redu Depth: (cm) 0.1 Epithe Clustered Quantity: 4 Tunnel Area: (cm) 0.126 Under Volume: (cm) 0.013 ction in Area: 99.4% ction in Volume: 99.3% lialization: Medium (34-66%) ing: No mining: No Wound Description Classification: Grade 1 Wound Margin: Indistinct, nonvisible Exudate Amount: Medium Exudate Type: Serous Exudate Color: amber Foul Odor After Cleansing: No Slough/Fibrino No Wound Bed Granulation Amount: None Present (0%) Exposed Structure Necrotic Amount: Small (1-33%) Fascia Exposed: No Necrotic Quality: Eschar, Adherent Slough Fat Layer (Subcutaneous Tissue) Exposed: Yes Tendon Exposed: No Muscle Exposed: No Joint Exposed: No Bone Exposed: No Periwound Skin Texture Corniel, Kaileen A. ([id number]) Texture Color No Abnormalities Noted: No No Abnormalities Noted: No Callus: No Atrophie Blanche: No Crepitus: No Cyanosis: No Excoriation: No Ecchymosis: No Induration: No Erythema: No Rash: No Hemosiderin Staining: No Scarring: No Mottled: No Pallor: No Moisture Rubor: Yes No Abnormalities Noted: No Dry / Scaly: Yes Temperature / Pain Maceration: No Temperature: No Abnormality Wound Preparation Ulcer Cleansing: Rinsed/Irrigated with Saline Topical Anesthetic Applied: Other: lidocaine 4%, Treatment Notes Wound #5 (Right, Distal, Lateral Lower Leg) 1. Cleansed with: Clean wound with Normal Saline 2. Anesthetic Topical Lidocaine 4% cream to wound bed prior to debridement 3. Peri-wound Care: Moisturizing lotion 4. Dressing Applied: Calcium Alginate with Silver 5. Secondary Dressing  Applied ABD Pad Kerlix/Conform Notes silvercell conform tape and cotton sleeve Electronic Signature(s) Signed: 08/14/2018 1:00:50 PM By: Curtis Sites Signed: 08/31/2018 7:36:17 AM By: Elliot Gurney, BSN, RN, CWS, Kim RN, BSN Previous Signature: 08/06/2018 7:26:08 AM Version By: Elliot Gurney, BSN, RN, CWS, Kim RN, BSN Entered By: Curtis Sites on 08/14/2018 13:00:50 Bord, [name] ([id number]) -------------------------------------------------------------------------------- Vitals Details Patient Name: Marcia [name] Date of Service: 08/04/2018 9:30 AM Medical Record Number: [id number] Patient Account Number: [id number] Date of Birth/Sex: Jan 27, 1946 (72 y.o. F) Treating RN: Curtis Sites Primary Care Deontray Hunnicutt: Sherrie Mustache Other Clinician: Referring Salimata Christenson: Sherrie Mustache Treating Annagrace Carr/Extender: Linwood Dibbles, HOYT Weeks in Treatment: 12 Vital Signs Time Taken: 09:40 Temperature (F): 98.1 Height (in): 70 Pulse (bpm): 61 Weight (lbs): 193 Respiratory Rate (breaths/min): 16 Body Mass Index (BMI): 27.7 Blood Pressure (mmHg): 141/50 Reference Range: 80 -  120 mg / dl Electronic Signature(s) Signed: 08/04/2018 2:09:33 PM By: Dayton Martes RCP, RRT, CHT Entered By: Dayton Martes on 08/04/2018 09:43:46

## 2018-08-18 ENCOUNTER — Encounter: Payer: Medicare HMO | Attending: Physician Assistant | Admitting: Physician Assistant

## 2018-08-18 DIAGNOSIS — Z7984 Long term (current) use of oral hypoglycemic drugs: Secondary | ICD-10-CM | POA: Diagnosis not present

## 2018-08-18 DIAGNOSIS — I776 Arteritis, unspecified: Secondary | ICD-10-CM | POA: Diagnosis not present

## 2018-08-18 DIAGNOSIS — L03115 Cellulitis of right lower limb: Secondary | ICD-10-CM | POA: Insufficient documentation

## 2018-08-18 DIAGNOSIS — L97212 Non-pressure chronic ulcer of right calf with fat layer exposed: Secondary | ICD-10-CM | POA: Insufficient documentation

## 2018-08-18 DIAGNOSIS — E11622 Type 2 diabetes mellitus with other skin ulcer: Secondary | ICD-10-CM | POA: Insufficient documentation

## 2018-08-18 DIAGNOSIS — Z79899 Other long term (current) drug therapy: Secondary | ICD-10-CM | POA: Diagnosis not present

## 2018-08-18 DIAGNOSIS — E559 Vitamin D deficiency, unspecified: Secondary | ICD-10-CM | POA: Insufficient documentation

## 2018-08-18 DIAGNOSIS — L97312 Non-pressure chronic ulcer of right ankle with fat layer exposed: Secondary | ICD-10-CM | POA: Diagnosis not present

## 2018-08-18 DIAGNOSIS — E1140 Type 2 diabetes mellitus with diabetic neuropathy, unspecified: Secondary | ICD-10-CM | POA: Insufficient documentation

## 2018-08-18 DIAGNOSIS — I10 Essential (primary) hypertension: Secondary | ICD-10-CM | POA: Insufficient documentation

## 2018-08-18 DIAGNOSIS — E11621 Type 2 diabetes mellitus with foot ulcer: Secondary | ICD-10-CM | POA: Insufficient documentation

## 2018-08-20 NOTE — Progress Notes (Signed)
LUCEAL, STOCKMAN ([id number]) Visit Report for 08/18/2018 Arrival Information Details Patient Name: [name], [name] Date of Service: 08/18/2018 9:30 AM Medical Record Number: [id number] Patient Account Number: [id number] Date of Birth/Sex: 04-19-1946 (72 y.o. F) Treating RN: Curtis Sites Primary Care Maizie Garno: Sherrie Mustache Other Clinician: Referring Kilan Banfill: Sherrie Mustache Treating Cartier Mapel/Extender: Linwood Dibbles, HOYT Weeks in Treatment: 14 Visit Information History Since Last Visit Added or deleted any medications: No Patient Arrived: Ambulatory Any new allergies or adverse reactions: No Arrival Time: 09:33 Had a fall or experienced change in No Accompanied By: self activities of daily living that may affect Transfer Assistance: None risk of falls: Patient Identification Verified: Yes Signs or symptoms of abuse/neglect since last visito No Secondary Verification Process Completed: Yes Hospitalized since last visit: No Patient Requires Transmission-Based No Implantable device outside of the clinic excluding No Precautions: cellular tissue based products placed in the center Patient Has Alerts: Yes since last visit: Patient Alerts: DMII Has Dressing in Place as Prescribed: Yes Pain Present Now: No Electronic Signature(s) Signed: 08/18/2018 2:49:09 PM By: Dayton Martes RCP, RRT, CHT Entered By: Weyman Rodney, Lucio Edward on 08/18/2018 09:36:10 Vanzee, [name] ([id number]) -------------------------------------------------------------------------------- Encounter Discharge Information Details Patient Name: Marcia [name] Date of Service: 08/18/2018 9:30 AM Medical Record Number: [id number] Patient Account Number: [id number] Date of Birth/Sex: 11-Jan-1946 (72 y.o. F) Treating RN: Curtis Sites Primary Care Tamesha Ellerbrock: Sherrie Mustache Other Clinician: Referring Doloris Servantes: Sherrie Mustache Treating Breyah Akhter/Extender: Linwood Dibbles, HOYT Weeks in Treatment: 14 Encounter  Discharge Information Items Post Procedure Vitals Discharge Condition: Stable Temperature (F): 98.0 Ambulatory Status: Ambulatory Pulse (bpm): 68 Discharge Destination: Home Respiratory Rate (breaths/min): 18 Transportation: Private Auto Blood Pressure (mmHg): 135/52 Accompanied By: self Schedule Follow-up Appointment: Yes Clinical Summary of Care: Electronic Signature(s) Signed: 08/18/2018 5:03:03 PM By: Curtis Sites Entered By: Curtis Sites on 08/18/2018 10:18:11 Grossberg, [name] ([id number]) -------------------------------------------------------------------------------- Lower Extremity Assessment Details Patient Name: Marcia [name] Date of Service: 08/18/2018 9:30 AM Medical Record Number: [id number] Patient Account Number: [id number] Date of Birth/Sex: [birth year] (72 y.o. F) Treating RN: Huel Coventry Primary Care Anoop Hemmer: Sherrie Mustache Other Clinician: Referring Tiger Spieker: Sherrie Mustache Treating Messiah Rovira/Extender: Linwood Dibbles, HOYT Weeks in Treatment: 14 Edema Assessment Assessed: [Left: No] [Right: Yes] Edema: [Left: N] [Right: o] Vascular Assessment Pulses: Dorsalis Pedis Palpable: [Right:Yes] Posterior Tibial Extremity colors, hair growth, and conditions: Extremity Color: [Right:Red] Hair Growth on Extremity: [Right:Yes] Temperature of Extremity: [Right:Warm] Capillary Refill: [Right:< 3 seconds] Toe Nail Assessment Left: Right: Thick: Yes Discolored: No Deformed: No Improper Length and Hygiene: No Electronic Signature(s) Signed: 08/18/2018 4:48:54 PM By: Elliot Gurney, BSN, RN, CWS, Kim RN, BSN Entered By: Elliot Gurney, BSN, RN, CWS, Kim on 08/18/2018 09:43:57 Holsomback, [name] ([id number]) -------------------------------------------------------------------------------- Multi Wound Chart Details Patient Name: Marcia [name] Date of Service: 08/18/2018 9:30 AM Medical Record Number: [id number] Patient Account Number: [id number] Date of Birth/Sex: [birth year] (72 y.o.  F) Treating RN: Curtis Sites Primary Care Felicite Zeimet: Sherrie Mustache Other Clinician: Referring Amadu Schlageter: Sherrie Mustache Treating Pasty Manninen/Extender: Linwood Dibbles, HOYT Weeks in Treatment: 14 Vital Signs Height(in): 70 Pulse(bpm): 68 Weight(lbs): 193 Blood Pressure(mmHg): 135/52 Body Mass Index(BMI): 28 Temperature(F): 98.0 Respiratory Rate 16 (breaths/min): Photos: [3:No Photos] [5:No Photos] [N/A:N/A] Wound Location: [3:Right, Posterior Lower Leg] [5:Right, Distal, Lateral Lower Leg] [N/A:N/A] Wounding Event: [3:Gradually Appeared] [5:Gradually Appeared] [N/A:N/A] Primary Etiology: [3:Vasculitis] [5:Diabetic Wound/Ulcer of the Lower Extremity] [N/A:N/A] Secondary Etiology: [3:Diabetic Wound/Ulcer of the Lower Extremity] [5:N/A] [N/A:N/A] Date Acquired: [3:03/12/2018] [5:06/28/2018] [N/A:N/A] Weeks of Treatment: [3:14] [5:7] [N/A:N/A] Wound Status: [3:Open] [5:Open] [  N/A:N/A] Clustered Wound: [3:No] [5:Yes] [N/A:N/A] Measurements L x W x D [3:0.5x0.6x0.1] [5:1x0.4x0.1] [N/A:N/A] (cm) Area (cm) : [3:0.236] [5:0.314] [N/A:N/A] Volume (cm) : [3:0.024] [5:0.031] [N/A:N/A] % Reduction in Area: [3:77.70%] [5:98.40%] [N/A:N/A] % Reduction in Volume: [3:77.40%] [5:98.40%] [N/A:N/A] Classification: [3:Full Thickness Without Exposed Support Structures] [5:Grade 1] [N/A:N/A] Periwound Skin Texture: [3:No Abnormalities Noted] [5:No Abnormalities Noted] [N/A:N/A] Periwound Skin Moisture: [3:No Abnormalities Noted] [5:No Abnormalities Noted] [N/A:N/A] Periwound Skin Color: [3:No Abnormalities Noted No] [5:No Abnormalities Noted No] [N/A:N/A N/A] Treatment Notes Electronic Signature(s) Signed: 08/18/2018 5:03:03 PM By: Curtis Sites Entered By: Curtis Sites on 08/18/2018 10:10:58 Buss, [name] ([id number]) -------------------------------------------------------------------------------- Multi-Disciplinary Care Plan Details Patient Name: Marcia [name] Date of Service: 08/18/2018 9:30  AM Medical Record Number: [id number] Patient Account Number: [id number] Date of Birth/Sex: [birth year] (72 y.o. F) Treating RN: Huel Coventry Primary Care Owenn Rothermel: Sherrie Mustache Other Clinician: Referring Verginia Toohey: Sherrie Mustache Treating Annaliyah Willig/Extender: Linwood Dibbles, HOYT Weeks in Treatment: 14 Active Inactive ` Abuse / Safety / Falls / Self Care Management Nursing Diagnoses: Potential for falls Goals: Patient will not experience any injury related to falls Date Initiated: 05/12/2018 Target Resolution Date: 08/15/2018 Goal Status: Active Interventions: Assess Activities of Daily Living upon admission and as needed Assess fall risk on admission and as needed Assess: immobility, friction, shearing, incontinence upon admission and as needed Assess impairment of mobility on admission and as needed per policy Assess personal safety and home safety (as indicated) on admission and as needed Notes: ` Nutrition Nursing Diagnoses: Imbalanced nutrition Impaired glucose control: actual or potential Goals: Patient/caregiver agrees to and verbalizes understanding of need to use nutritional supplements and/or vitamins as prescribed Date Initiated: 05/12/2018 Target Resolution Date: 09/12/2018 Goal Status: Active Patient/caregiver will maintain therapeutic glucose control Date Initiated: 05/12/2018 Target Resolution Date: 08/15/2018 Goal Status: Active Interventions: Assess patient nutrition upon admission and as needed per policy Provide education on elevated blood sugars and impact on wound healing Provide education on nutrition Notes: JARROD, BODKINS ([id number]) Orientation to the Wound Care Program Nursing Diagnoses: Knowledge deficit related to the wound healing center program Goals: Patient/caregiver will verbalize understanding of the Wound Healing Center Program Date Initiated: 05/12/2018 Target Resolution Date: 06/13/2018 Goal Status: Active Interventions: Provide education on  orientation to the wound center Notes: ` Wound/Skin Impairment Nursing Diagnoses: Impaired tissue integrity Knowledge deficit related to ulceration/compromised skin integrity Goals: Ulcer/skin breakdown will have a volume reduction of 80% by week 12 Date Initiated: 05/12/2018 Target Resolution Date: 09/05/2018 Goal Status: Active Interventions: Assess patient/caregiver ability to perform ulcer/skin care regimen upon admission and as needed Assess ulceration(s) every visit Notes: Electronic Signature(s) Signed: 08/18/2018 4:48:54 PM By: Elliot Gurney, BSN, RN, CWS, Kim RN, BSN Entered By: Elliot Gurney, BSN, RN, CWS, Kim on 08/18/2018 09:44:20 Gibbins, [name] ([id number]) -------------------------------------------------------------------------------- Pain Assessment Details Patient Name: Marcia [name] Date of Service: 08/18/2018 9:30 AM Medical Record Number: [id number] Patient Account Number: [id number] Date of Birth/Sex: 1946-09-01 (72 y.o. F) Treating RN: Curtis Sites Primary Care Pryor Guettler: Sherrie Mustache Other Clinician: Referring Wonda Goodgame: Sherrie Mustache Treating Makeisha Jentsch/Extender: Linwood Dibbles, HOYT Weeks in Treatment: 14 Active Problems Location of Pain Severity and Description of Pain Patient Has Paino No Site Locations Pain Management and Medication Current Pain Management: Electronic Signature(s) Signed: 08/18/2018 2:49:09 PM By: Dayton Martes RCP, RRT, CHT Signed: 08/18/2018 5:03:03 PM By: Curtis Sites Entered By: Dayton Martes on 08/18/2018 09:36:18 Yeomans, [name] ([id number]) -------------------------------------------------------------------------------- Patient/Caregiver Education Details Patient Name: Marcia [name] Date of Service: 08/18/2018 9:30 AM Medical  Record Number: [id number] Patient Account Number: [id number] Date of Birth/Gender: [birth year] (72 y.o. F) Treating RN: Curtis Sites Primary Care Physician: Sherrie Mustache Other  Clinician: Referring Physician: Sherrie Mustache Treating Physician/Extender: Skeet Simmer in Treatment: 14 Education Assessment Education Provided To: Patient Education Topics Provided Wound/Skin Impairment: Handouts: Other: wound care as ordered Methods: Demonstration, Explain/Verbal Responses: State content correctly Electronic Signature(s) Signed: 08/18/2018 5:03:03 PM By: Curtis Sites Entered By: Curtis Sites on 08/18/2018 10:17:18 Cimmino, [name] ([id number]) -------------------------------------------------------------------------------- Wound Assessment Details Patient Name: Marcia [name] Date of Service: 08/18/2018 9:30 AM Medical Record Number: [id number] Patient Account Number: [id number] Date of Birth/Sex: [birth year] (72 y.o. F) Treating RN: Huel Coventry Primary Care Avraham Benish: Sherrie Mustache Other Clinician: Referring Raiven Belizaire: Sherrie Mustache Treating Sherika Kubicki/Extender: Linwood Dibbles, HOYT Weeks in Treatment: 14 Wound Status Wound Number: 3 Primary Etiology: Vasculitis Wound Location: Right, Posterior Lower Leg Secondary Diabetic Wound/Ulcer of the Lower Etiology: Extremity Wounding Event: Gradually Appeared Wound Status: Open Date Acquired: 03/12/2018 Weeks Of Treatment: 14 Clustered Wound: No Wound Measurements Length: (cm) 0.5 Width: (cm) 0.6 Depth: (cm) 0.1 Area: (cm) 0.236 Volume: (cm) 0.024 % Reduction in Area: 77.7% % Reduction in Volume: 77.4% Wound Description Full Thickness Without Exposed Support Classification: Structures Periwound Skin Texture Texture Color No Abnormalities Noted: No No Abnormalities Noted: No Moisture No Abnormalities Noted: No Treatment Notes Wound #3 (Right, Posterior Lower Leg) 1. Cleansed with: Clean wound with Normal Saline 2. Anesthetic Topical Lidocaine 4% cream to wound bed prior to debridement 3. Peri-wound Care: Moisturizing lotion 4. Dressing Applied: Calcium Alginate with Silver 5. Secondary  Dressing Applied ABD Pad Kerlix/Conform Notes silvercell conform tape and cotton sleeve Electronic Signature(s) Signed: 08/18/2018 4:48:54 PM By: Elliot Gurney, BSN, RN, CWS, Kim RN, BSN [place]aker, Marcia AMarland Kitchen ([id number]) Entered By: Elliot Gurney, BSN, RN, CWS, Kim on 08/18/2018 09:42:42 Rosinski, [name] ([id number]) -------------------------------------------------------------------------------- Wound Assessment Details Patient Name: Marcia [name] Date of Service: 08/18/2018 9:30 AM Medical Record Number: [id number] Patient Account Number: [id number] Date of Birth/Sex: October 31, 1945 (72 y.o. F) Treating RN: Huel Coventry Primary Care Evaleigh Mccamy: Sherrie Mustache Other Clinician: Referring Meldon Hanzlik: Sherrie Mustache Treating Janice Bodine/Extender: Linwood Dibbles, HOYT Weeks in Treatment: 14 Wound Status Wound Number: 5 Primary Diabetic Wound/Ulcer of the Lower Etiology: Extremity Wound Location: Right, Distal, Lateral Lower Leg Wound Status: Open Wounding Event: Gradually Appeared Date Acquired: 06/28/2018 Weeks Of Treatment: 7 Clustered Wound: Yes Wound Measurements Length: (cm) 1 Width: (cm) 0.4 Depth: (cm) 0.1 Area: (cm) 0.314 Volume: (cm) 0.031 % Reduction in Area: 98.4% % Reduction in Volume: 98.4% Wound Description Classification: Grade 1 Periwound Skin Texture Texture Color No Abnormalities Noted: No No Abnormalities Noted: No Moisture No Abnormalities Noted: No Treatment Notes Wound #5 (Right, Distal, Lateral Lower Leg) 1. Cleansed with: Clean wound with Normal Saline 2. Anesthetic Topical Lidocaine 4% cream to wound bed prior to debridement 3. Peri-wound Care: Moisturizing lotion 4. Dressing Applied: Calcium Alginate with Silver 5. Secondary Dressing Applied ABD Pad Kerlix/Conform Notes silvercell conform tape and cotton sleeve Electronic Signature(s) Signed: 08/18/2018 4:48:54 PM By: Elliot Gurney, BSN, RN, CWS, Kim RN, BSN Entered By: Elliot Gurney, BSN, RN, CWS, Kim on 08/18/2018 09:42:42 [place]ton,  [name] ([id number]) [name], [name] ([id number]) -------------------------------------------------------------------------------- Vitals Details Patient Name: Marcia [name] Date of Service: 08/18/2018 9:30 AM Medical Record Number: [id number] Patient Account Number: [id number] Date of Birth/Sex: [birth year] (72 y.o. F) Treating RN: Curtis Sites Primary Care Jayli Fogleman: Sherrie Mustache Other Clinician: Referring Tatum Massman: Sherrie Mustache Treating Zyshawn Bohnenkamp/Extender: Linwood Dibbles, HOYT Weeks in Treatment: 14 Vital  Signs Time Taken: 09:35 Temperature (F): 98.0 Height (in): 70 Pulse (bpm): 68 Weight (lbs): 193 Respiratory Rate (breaths/min): 16 Body Mass Index (BMI): 27.7 Blood Pressure (mmHg): 135/52 Reference Range: 80 - 120 mg / dl Electronic Signature(s) Signed: 08/18/2018 2:49:09 PM By: Dayton Martes RCP, RRT, CHT Entered By: Dayton Martes on 08/18/2018 09:39:12

## 2018-08-21 NOTE — Progress Notes (Signed)
MARQUITTA, [name] ([id number]) Visit Report for 08/18/2018 Chief Complaint Document Details Patient Name: [name], [name] Date of Service: 08/18/2018 9:30 AM Medical Record Number: [id number] Patient Account Number: [id number] Date of Birth/Sex: 1946/02/02 (72 y.o. F) Treating RN: Curtis Sites Primary Care Provider: Sherrie Mustache Other Clinician: Referring Provider: Sherrie Mustache Treating Provider/Extender: Skeet Simmer in Treatment: 14 Information Obtained from: Patient Chief Complaint RLE wounds Electronic Signature(s) Signed: 08/18/2018 5:50:45 PM By: Lenda Kelp PA-C Entered By: Lenda Kelp on 08/18/2018 10:06:46 [name], [name] ([id number]) -------------------------------------------------------------------------------- Debridement Details Patient Name: Marcia [name] Date of Service: 08/18/2018 9:30 AM Medical Record Number: [id number] Patient Account Number: [id number] Date of Birth/Sex: [birth year] (72 y.o. F) Treating RN: Curtis Sites Primary Care Provider: Sherrie Mustache Other Clinician: Referring Provider: Sherrie Mustache Treating Provider/Extender: [name], Marcia Weeks in Treatment: 14 Debridement Performed for Wound #3 Right,Posterior Lower Leg Assessment: Performed By: Physician STONE [name], Marcia E., PA-C Debridement Type: Debridement Severity of Tissue Pre Fat layer exposed Debridement: Level of Consciousness (Pre- Awake and Alert procedure): Pre-procedure Verification/Time Yes - 10:11 Out Taken: Start Time: 10:11 Pain Control: Lidocaine 4% Topical Solution Total Area Debrided (L x W): 0.5 (cm) x 0.6 (cm) = 0.3 (cm) Tissue and other material Viable, Non-Viable, Slough, Subcutaneous, Slough debrided: Level: Skin/Subcutaneous Tissue Debridement Description: Excisional Instrument: Curette Bleeding: Minimum Hemostasis Achieved: Pressure End Time: 10:13 Procedural Pain: 0 Post Procedural Pain: 0 Response to Treatment: Procedure was  tolerated well Level of Consciousness Awake and Alert (Post-procedure): Post Debridement Measurements of Total Wound Length: (cm) 0.5 Width: (cm) 0.6 Depth: (cm) 0.2 Volume: (cm) 0.047 Character of Wound/Ulcer Post Debridement: Improved Severity of Tissue Post Debridement: Fat layer exposed Post Procedure Diagnosis Same as Pre-procedure Electronic Signature(s) Signed: 08/18/2018 5:03:03 PM By: Curtis Sites Signed: 08/18/2018 5:50:45 PM By: Lenda Kelp PA-C Entered By: Curtis Sites on 08/18/2018 10:13:33 [name], [name] Kitchen ([id number]) -------------------------------------------------------------------------------- Debridement Details Patient Name: Marcia [name] Date of Service: 08/18/2018 9:30 AM Medical Record Number: [id number] Patient Account Number: [id number] Date of Birth/Sex: 06-03-1946 (72 y.o. F) Treating RN: Curtis Sites Primary Care Provider: Sherrie Mustache Other Clinician: Referring Provider: Sherrie Mustache Treating Provider/Extender: [name], Marcia Weeks in Treatment: 14 Debridement Performed for Wound #5 Right,Distal,Lateral Lower Leg Assessment: Performed By: Physician STONE [name], Marcia E., PA-C Debridement Type: Debridement Severity of Tissue Pre Fat layer exposed Debridement: Level of Consciousness (Pre- Awake and Alert procedure): Pre-procedure Verification/Time Yes - 10:13 Out Taken: Start Time: 10:13 Pain Control: Lidocaine 4% Topical Solution Total Area Debrided (L x W): 1 (cm) x 0.4 (cm) = 0.4 (cm) Tissue and other material Viable, Non-Viable, Slough, Subcutaneous, Slough debrided: Level: Skin/Subcutaneous Tissue Debridement Description: Excisional Instrument: Curette Bleeding: Minimum Hemostasis Achieved: Pressure End Time: 10:16 Procedural Pain: 0 Post Procedural Pain: 0 Response to Treatment: Procedure was tolerated well Level of Consciousness Awake and Alert (Post-procedure): Post Debridement Measurements of Total  Wound Length: (cm) 1 Width: (cm) 0.4 Depth: (cm) 0.2 Volume: (cm) 0.063 Character of Wound/Ulcer Post Debridement: Improved Severity of Tissue Post Debridement: Fat layer exposed Post Procedure Diagnosis Same as Pre-procedure Electronic Signature(s) Signed: 08/18/2018 5:03:03 PM By: Curtis Sites Signed: 08/18/2018 5:50:45 PM By: Lenda Kelp PA-C Entered By: Curtis Sites on 08/18/2018 10:16:29 [name], [name] Kitchen ([id number]) -------------------------------------------------------------------------------- HPI Details Patient Name: Marcia [name] Date of Service: 08/18/2018 9:30 AM Medical Record Number: [id number] Patient Account Number: [id number] Date of Birth/Sex: Dec 24, 1945 (72 y.o. F) Treating RN: Curtis Sites Primary Care Provider: Sherrie Mustache Other  Clinician: Referring Provider: Sherrie Mustache Treating Provider/Extender: [name], Marcia Weeks in Treatment: 14 History of Present Illness HPI Description: 05/12/18-She is seen in initial evaluation for multiple open areas to the right lower extremity. She was recently hospitalized (7/30-8/2) with cellulitis of the right leg, failed outpatient therapy with Keflex. While hospitalized she was treated with IV vancomycin and cefepime; right foot x-ray was negative for osteomyelitis. She admits to intermittent pain, improved since hospitalization. She was discharged on doxycycline which she should complete with the next 48 hours. 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 She has a remote history of following with vascular medicine for venous reflux, she does not tolerate wearing compression stockings d/t skin sensitivities and "burning" sensation. In office ABI are elevated but she has strongly palpable pulses. We will initiate hydrofera blue and ace wrap compression. She is a diabetic with A1c 6.9 while hospitalized. 05/19/18- She is  seen in follow up evaluation for RLE wounds; the lateral area is healed. She did tolerate ace wrap for compression, but experienced increased xerosis. We will hold off on ace wrap and increase lachydrin to twice daily. She c/o pain to the medial cluster of wounds, there is no apparent infection and will initiate steroid cream. She will follow up next week 05/26/18 on evaluation today patient appears to actually be having some issues currently with pain. She has been utilizing the dressings as previously recommended for the lower extremities. With that being said she does appear to have a little bit of a rash she states that anytime it he's in for anything else touches her skin that she will often develop a rash. She's not even sure that should be able to tolerate a compression wrap due to the fact that again she would be concerned about even the cotton layer being in contact with her skin which overtime will cause a burning sensation. She has seen dermatology concerning this unfortunately they really did not have a good answer for her as to why this is occurring. Fortunately there is no evidence of infection at this time. 05/29/18 on evaluation today patient was actually supposed to be back in the office in order to have a dressings/wrap change is a nurse visit. With that being said she was having significant discomfort with want to touch around the wound and. It green discharge. Subsequently the concern was that she had an infection so we did convert her to a physician visit instead of just a nurse visit. Subsequently upon evaluation she does seem to have purulent discharge along with evidence of cellulitis extending up the leg which is definitely worse than when I saw her earlier in the week. Fortunately there does not appear to be any evidence of systemic infection at this point. No fevers, chills, nausea, or vomiting noted at this time. 06/02/18 on evaluation today the patient appears to be doing  better in regard to the overall appearance in regard to the wound bed. She still does have slough covering the surface of the wounds and due to pain really I do not think sharp debridement is going to be advisable at this point. Nonetheless I do feel like that she is definitely showing signs of improvement compared to her previous evaluation. Her pain level is still rather high she tells me at times 8-9/10. Unfortunately she is having a difficult time getting into pain management she states that the one that we referred  her to stated it would be at least two weeks before they can get her in. 06/12/18 on evaluation today patient actually appears to be doing much better in regard to her lower extremity wounds. In fact there does not appear to be any evidence of infection at this time I do believe the Cipro has been effective her pain is also greatly improved compared to previous. Nonetheless she does have some Slough noted that is gonna require debridement. Patient's MRI was reviewed and revealed that she had no evidence of osteomyelitis, septic arthritis, or absence. She did have blisters noted on the surface of the foot which again visually we see today as well. 06/16/18 on evaluation today patient actually appears to be doing fairly well in regard to her lower extremity ulcerations. With that being said she does have issues at this point with discomfort although she did see the pain management physician this morning he did give her medication to help in this regard. She tells me that after I debrided her wound last week that she actually had significant pain to the point that she ended up having a difficult time even driving to get home. Obviously when she left the clinic she was not in that much pain this somewhat surprises me especially since we put lidocaine on her wounds prior to applying the dressings and the dressing we were using was Prisma which is not notorious for causing discomfort  and [name], [name] ([id number]) burning. Nonetheless she did obviously experience this and she tells me that she set intermittent spells where this occurred in the week since I last saw her. Nonetheless she has been given the pain medications necessary in order to keep things under control at this point. Obviously this is short term but nonetheless I think will be helpful in getting the wounds to heal so that we can appropriately debrided do what we need to do. 06/23/18 on evaluation today patient actually appears to be doing significantly better in regard to her right lower extremity ulcerations. In fact she has made dramatic improvement pretty much all locations which is great news. Fortunately there's no evidence of infection at this time. Her irritation and inflammation also is better than I do believe that the treatment utilizing the Santyl has been beneficial over the past week. I'm not even sure that we need that any further. 06/30/18 on evaluation today patient actually is doing worse compared to her previous evaluation. Unfortunately she's been having increased drainage, pain, and overall I feel like this is likely due to infection based on the appearance of her wound today. In general the patient seems to have digressed although not really back to where she started although definitely not as well she's been doing in the past couple of weeks. She's not having green drainage like she did previous but she is having a lot of drainage to the point that she's actually dripping from the posterior portion of her leg at this time. 07/07/18 on evaluation today patient actually appears to be doing rather well in regard to her right lower extremity ulcers. They also do measuring smaller which is good news she has a lot of dry and cracked skin which is gonna make her more prone to reinfection we need to work on this. Nonetheless I do believe that in general the patient has been doing well overall with  her current dressing changes and I do believe the doxycycline was beneficial she did have a Staphylococcus aureus infection fortunately this was  not MRSA. The doxycycline also is appropriate for this infection. 07/21/18 on evaluation today patient's wound bed actually shows evidence of good improvement. This is really in regard to all locations. There was some dried dressing/eschar covering several of the areas but fortunately she's not having the amount of pain that she previously was experiencing. No fevers, chills, nausea, or vomiting noted at this time. 08/04/18 on evaluation today patient appears to be doing very well in regard to her right lower extremity ulcers. She's been tolerating the dressing changes without complication. Fortunately there does not appear to be any evidence of infection at this time which is excellent news. Overall very pleased with the progress that has been made. The patient is having much less pain she's even been able to cut back a little bit of the pain medication she was taking roughly 3 a day on a regular basis she struck back to two and still seems to be doing well. Overall that is great news. 08/18/18 on evaluation today patient's lower extremity wounds actually both appear to be doing excellent at this time. She has been tolerating the dressing changes without complication. Overall I've been extremely pleased with the progress that she's made. The patient likewise is having much less pain and is very happy as well. Electronic Signature(s) Signed: 08/18/2018 5:50:45 PM By: Lenda Kelp PA-C Entered By: Lenda Kelp on 08/18/2018 17:15:31 [name], [name] ([id number]) -------------------------------------------------------------------------------- Physical Exam Details Patient Name: Marcia [name] Date of Service: 08/18/2018 9:30 AM Medical Record Number: [id number] Patient Account Number: [id number] Date of Birth/Sex: 1946/05/30 (72 y.o. F) Treating RN:  Curtis Sites Primary Care Provider: Sherrie Mustache Other Clinician: Referring Provider: Sherrie Mustache Treating Provider/Extender: [name], Marcia Weeks in Treatment: 14 Constitutional Well-nourished and well-hydrated in no acute distress. Respiratory normal breathing without difficulty. Psychiatric this patient is able to make decisions and demonstrates good insight into disease process. Alert and Oriented x 3. pleasant and cooperative. Notes Upon inspection patient did require some sharp debridement in regard to the two remaining wounds that were open. The debridement was minimal but I did remove slough and subcutaneous tissue from the surface of the wound which I'm hoping will allow this to heal more quickly and appropriately. She tolerated this with only minimal pain. Electronic Signature(s) Signed: 08/18/2018 5:50:45 PM By: Lenda Kelp PA-C Entered By: Lenda Kelp on 08/18/2018 17:16:08 Johannesen, [name] ([id number]) -------------------------------------------------------------------------------- Physician Orders Details Patient Name: Marcia [name] Date of Service: 08/18/2018 9:30 AM Medical Record Number: [id number] Patient Account Number: [id number] Date of Birth/Sex: 02-Aug-1946 (72 y.o. 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 Wound #5 Right,Distal,Lateral Lower Leg o May Shower, gently pat wound dry  prior to applying new dressing. Skin Barriers/Peri-Wound Care Wound #3 Right,Posterior Lower Leg o Moisturizing lotion Wound #5 Right,Distal,Lateral Lower Leg o Moisturizing lotion Primary Wound Dressing Wound #3 Right,Posterior Lower Leg o Silver Alginate Wound #5 Right,Distal,Lateral Lower Leg o Silver Alginate Secondary Dressing Wound #3 Right,Posterior Lower Leg o ABD and Kerlix/Conform Wound #  5 Right,Distal,Lateral Lower Leg o ABD and Kerlix/Conform Dressing Change Frequency Wound #3 Right,Posterior Lower Leg o Change dressing every day. Wound #5 Right,Distal,Lateral Lower Leg o Change dressing every day. YUMA, BLUCHER ([id number]) Follow-up Appointments Wound #3 Right,Posterior Lower Leg o Return Appointment in 2 weeks. Wound #5 Right,Distal,Lateral Lower Leg o Return Appointment in 2 weeks. Edema Control Wound #3 Right,Posterior Lower Leg o Elevate legs to the level of the heart and pump ankles as often as possible Wound #5 Right,Distal,Lateral Lower Leg o Elevate legs to the level of the heart and pump ankles as often as possible Electronic Signature(s) Signed: 08/18/2018 5:03:03 PM By: Curtis Sites Signed: 08/18/2018 5:50:45 PM By: Lenda Kelp PA-C Entered By: Curtis Sites on 08/18/2018 10:16:56 [name], [name] ([id number]) -------------------------------------------------------------------------------- Problem List Details Patient Name: Marcia [name] Date of Service: 08/18/2018 9:30 AM Medical Record Number: [id number] Patient Account Number: [id number] Date of Birth/Sex: 06-Feb-1946 (72 y.o. 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 05/12/2018 No Yes L97.212  Non-pressure chronic ulcer of right calf with fat layer exposed 05/12/2018 No Yes L97.312 Non-pressure chronic ulcer of right ankle with fat layer 05/12/2018 No Yes exposed E11.622 Type 2 diabetes mellitus with other skin ulcer 05/12/2018 No Yes Inactive Problems Resolved Problems Electronic Signature(s) Signed: 08/18/2018 5:50:45 PM By: Lenda Kelp PA-C Entered By: Lenda Kelp on 08/18/2018 10:06:41 [name], [name] Kitchen ([id number]) -------------------------------------------------------------------------------- Progress Note Details Patient Name: Marcia [name] Date of Service: 08/18/2018 9:30 AM Medical Record Number: [id number] Patient Account Number: [id number] Date of Birth/Sex: October 24, 1945 (72 y.o. F) Treating RN: Curtis Sites Primary Care Provider: Sherrie Mustache Other Clinician: Referring Provider: Sherrie Mustache Treating Provider/Extender: [name], Marcia Weeks in Treatment: 14 Subjective Chief Complaint Information obtained from Patient RLE wounds History of Present Illness (HPI) 05/12/18-She is seen in initial evaluation for multiple open areas to the right lower extremity. She was recently hospitalized (7/30-8/2) with cellulitis of the right leg, failed outpatient therapy with Keflex. While hospitalized she was treated with IV vancomycin and cefepime; right foot x-ray was negative for osteomyelitis. She admits to intermittent pain, improved since hospitalization. She was discharged on doxycycline which she should complete with the next 48 hours. 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 She has a remote history of following with vascular medicine for venous reflux, she does not tolerate wearing compression stockings d/t skin sensitivities and "burning" sensation. In office ABI are elevated but she has strongly palpable pulses. We will initiate hydrofera blue and ace wrap  compression. She is a diabetic with A1c 6.9 while hospitalized. 05/19/18- She is seen in follow up evaluation for RLE wounds; the lateral area is healed. She did tolerate ace wrap for compression, but experienced increased xerosis. We will hold off on ace wrap and increase lachydrin to twice daily. She c/o pain to the medial cluster of wounds, there is no apparent infection and will initiate steroid cream. She will follow up next week 05/26/18 on evaluation today patient appears to actually be having some issues currently with pain. She has been utilizing the dressings as previously recommended for the lower extremities. With that being said she does appear to have a little bit of a rash she states that anytime it  he's in for anything else touches her skin that she will often develop a rash. She's not even sure that should be able to tolerate a compression wrap due to the fact that again she would be concerned about even the cotton layer being in contact with her skin which overtime will cause a burning sensation. She has seen dermatology concerning this unfortunately they really did not have a good answer for her as to why this is occurring. Fortunately there is no evidence of infection at this time. 05/29/18 on evaluation today patient was actually supposed to be back in the office in order to have a dressings/wrap change is a nurse visit. With that being said she was having significant discomfort with want to touch around the wound and. It green discharge. Subsequently the concern was that she had an infection so we did convert her to a physician visit instead of just a nurse visit. Subsequently upon evaluation she does seem to have purulent discharge along with evidence of cellulitis extending up the leg which is definitely worse than when I saw her earlier in the week. Fortunately there does not appear to be any evidence of systemic infection at this point. No fevers, chills, nausea, or vomiting  noted at this time. 06/02/18 on evaluation today the patient appears to be doing better in regard to the overall appearance in regard to the wound bed. She still does have slough covering the surface of the wounds and due to pain really I do not think sharp debridement is going to be advisable at this point. Nonetheless I do feel like that she is definitely showing signs of improvement compared to her previous evaluation. Her pain level is still rather high she tells me at times 8-9/10. Unfortunately she is having a difficult time getting into pain management she states that the one that we referred her to stated it would be at least two weeks before they can get her in. 06/12/18 on evaluation today patient actually appears to be doing much better in regard to her lower extremity wounds. In fact there does not appear to be any evidence of infection at this time I do believe the Cipro has been effective her pain is also greatly improved compared to previous. Nonetheless she does have some Slough noted that is gonna require debridement. Patient's MRI was reviewed and revealed that she had no evidence of osteomyelitis, septic arthritis, or absence. She did have blisters noted on the surface of the foot which again visually we see today as well. [name], MUCKLE ([id number]) 06/16/18 on evaluation today patient actually appears to be doing fairly well in regard to her lower extremity ulcerations. With that being said she does have issues at this point with discomfort although she did see the pain management physician this morning he did give her medication to help in this regard. She tells me that after I debrided her wound last week that she actually had significant pain to the point that she ended up having a difficult time even driving to get home. Obviously when she left the clinic she was not in that much pain this somewhat surprises me especially since we put lidocaine on her wounds prior to applying  the dressings and the dressing we were using was Prisma which is not notorious for causing discomfort and burning. Nonetheless she did obviously experience this and she tells me that she set intermittent spells where this occurred in the week since I last saw her. Nonetheless she has  been given the pain medications necessary in order to keep things under control at this point. Obviously this is short term but nonetheless I think will be helpful in getting the wounds to heal so that we can appropriately debrided do what we need to do. 06/23/18 on evaluation today patient actually appears to be doing significantly better in regard to her right lower extremity ulcerations. In fact she has made dramatic improvement pretty much all locations which is great news. Fortunately there's no evidence of infection at this time. Her irritation and inflammation also is better than I do believe that the treatment utilizing the Santyl has been beneficial over the past week. I'm not even sure that we need that any further. 06/30/18 on evaluation today patient actually is doing worse compared to her previous evaluation. Unfortunately she's been having increased drainage, pain, and overall I feel like this is likely due to infection based on the appearance of her wound today. In general the patient seems to have digressed although not really back to where she started although definitely not as well she's been doing in the past couple of weeks. She's not having green drainage like she did previous but she is having a lot of drainage to the point that she's actually dripping from the posterior portion of her leg at this time. 07/07/18 on evaluation today patient actually appears to be doing rather well in regard to her right lower extremity ulcers. They also do measuring smaller which is good news she has a lot of dry and cracked skin which is gonna make her more prone to reinfection we need to work on this. Nonetheless I do  believe that in general the patient has been doing well overall with her current dressing changes and I do believe the doxycycline was beneficial she did have a Staphylococcus aureus infection fortunately this was not MRSA. The doxycycline also is appropriate for this infection. 07/21/18 on evaluation today patient's wound bed actually shows evidence of good improvement. This is really in regard to all locations. There was some dried dressing/eschar covering several of the areas but fortunately she's not having the amount of pain that she previously was experiencing. No fevers, chills, nausea, or vomiting noted at this time. 08/04/18 on evaluation today patient appears to be doing very well in regard to her right lower extremity ulcers. She's been tolerating the dressing changes without complication. Fortunately there does not appear to be any evidence of infection at this time which is excellent news. Overall very pleased with the progress that has been made. The patient is having much less pain she's even been able to cut back a little bit of the pain medication she was taking roughly 3 a day on a regular basis she struck back to two and still seems to be doing well. Overall that is great news. 08/18/18 on evaluation today patient's lower extremity wounds actually both appear to be doing excellent at this time. She has been tolerating the dressing changes without complication. Overall I've been extremely pleased with the progress that she's made. The patient likewise is having much less pain and is very happy as well. Patient History Information obtained from Patient. Family History Cancer - Siblings, Diabetes - Mother,Father,Siblings, Heart Disease - Father, Hypertension - Father, Stroke - Father,Mother, No family history of Kidney Disease, Lung Disease, Seizures, Thyroid Problems, Tuberculosis. Social History Former smoker - 10-15 years quit, Marital Status - Married, Alcohol Use - Never,  Drug Use - No History, Caffeine Use -  Daily. Medical And Surgical History Notes Constitutional Symptoms (General Health) vitamin d deficiency Respiratory nodule of lung Cardiovascular dyspnea 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, [facility] [place], [place] A. ([id number]) Musculoskeletal arthritis Review of Systems (ROS) Constitutional Symptoms (General Health) Denies complaints or symptoms of Fever, Chills, Marked Weight Change. Respiratory The patient has no complaints or symptoms. Cardiovascular Complains or has symptoms of LE edema. Psychiatric The patient has no complaints or symptoms. Objective Constitutional Well-nourished and well-hydrated in no acute distress. Vitals Time Taken: 9:35 AM, Height: 70 in, Weight: 193 lbs, BMI: 27.7, Temperature: 98.0 F, Pulse: 68 bpm, Respiratory Rate: 16 breaths/min, Blood Pressure: 135/52 mmHg. Respiratory normal breathing without difficulty. Psychiatric this patient is able to make decisions and demonstrates good insight into disease process. Alert and Oriented x 3. pleasant and cooperative. General Notes: Upon inspection patient did require some sharp debridement in regard to the two remaining wounds that were open. The debridement was minimal but I did remove slough and subcutaneous tissue from the surface of the wound which I'm hoping will allow this to heal more quickly and appropriately. She tolerated this with only minimal pain. Integumentary (Hair, Skin) Wound #3 status is Open. Original cause of wound was Gradually Appeared. The wound is located on the Right,Posterior Lower Leg. The wound measures 0.5cm length x 0.6cm width x 0.1cm depth; 0.236cm^2 area and 0.024cm^3 volume. Wound #5 status is Open. Original cause of wound was Gradually Appeared. The wound is located on the Right,Distal,Lateral Lower Leg. The wound measures 1cm length x 0.4cm width x 0.1cm depth; 0.314cm^2 area and 0.031cm^3 volume. 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 There was a Excisional Skin/Subcutaneous Tissue Debridement with a total area of 0.3 sq cm performed by STONE [name], Marcia E., PA-C. With the following instrument(s): Curette to remove Viable and Non-Viable tissue/material. Material removed includes Subcutaneous Tissue and Slough and after achieving pain control using Lidocaine 4% Topical Solution. No specimens were taken. A time out was conducted at 10:11, prior to the start of the procedure. A Minimum amount of bleeding was controlled with Pressure. The procedure was tolerated well with a pain level of 0 throughout and a pain level of 0 following the procedure. Post Debridement Measurements: 0.5cm length x 0.6cm width x 0.2cm depth; 0.047cm^3 volume. Character of Wound/Ulcer Post Debridement is improved. Severity of Tissue Post Debridement is: Fat layer exposed. Post procedure Diagnosis Wound #3: Same as Pre-Procedure Wound #5 Pre-procedure diagnosis of Wound #5 is a Diabetic Wound/Ulcer of the Lower Extremity located on the Right,Distal,Lateral Lower Leg .Severity of Tissue Pre Debridement is: Fat layer exposed. There was a Excisional Skin/Subcutaneous Tissue Debridement with a total area of 0.4 sq cm performed by STONE [name], Marcia E., PA-C. With the following instrument(s): Curette to remove Viable and Non-Viable tissue/material. Material removed includes Subcutaneous Tissue and Slough and after achieving pain control using Lidocaine 4% Topical Solution. No specimens were taken. A time out was conducted at 10:13, prior to the start of the  procedure. A Minimum amount of bleeding was controlled with Pressure. The procedure was tolerated well with a pain level of 0 throughout and a pain level of 0 following  the procedure. Post Debridement Measurements: 1cm length x 0.4cm width x 0.2cm depth; 0.063cm^3 volume. Character of Wound/Ulcer Post Debridement is improved. Severity of Tissue Post Debridement is: Fat layer exposed. Post procedure Diagnosis Wound #5: Same as Pre-Procedure Plan Wound Cleansing: Wound #3 Right,Posterior Lower Leg: May Shower, gently pat wound dry prior to applying new dressing. Wound #5 Right,Distal,Lateral Lower Leg: May Shower, gently pat wound dry prior to applying new dressing. Skin Barriers/Peri-Wound Care: Wound #3 Right,Posterior Lower Leg: Moisturizing lotion Wound #5 Right,Distal,Lateral Lower Leg: Moisturizing lotion Primary Wound Dressing: Wound #3 Right,Posterior Lower Leg: Silver Alginate Wound #5 Right,Distal,Lateral Lower Leg: Silver Alginate Nagele, [name] ([id number]) Secondary Dressing: Wound #3 Right,Posterior Lower Leg: ABD and Kerlix/Conform Wound #5 Right,Distal,Lateral Lower Leg: ABD and Kerlix/Conform Dressing Change Frequency: Wound #3 Right,Posterior Lower Leg: Change dressing every day. Wound #5 Right,Distal,Lateral Lower Leg: Change dressing every day. Follow-up Appointments: Wound #3 Right,Posterior Lower Leg: Return Appointment in 2 weeks. Wound #5 Right,Distal,Lateral Lower Leg: Return Appointment in 2 weeks. Edema Control: Wound #3 Right,Posterior Lower Leg: Elevate legs to the level of the heart and pump ankles as often as possible Wound #5 Right,Distal,Lateral Lower Leg: Elevate legs to the level of the heart and pump ankles as often as possible I'm gonna suggest currently that we continue with the above wound care measures for the next week. The patient is in agreement with that plan. If anything changes or worsens in the meantime she will contact the  office and let me know. Otherwise we gonna see how things stand at follow-up in two weeks. Please see above for specific wound care orders. We will see patient for re-evaluation in 2 week(s) here in the clinic. If anything worsens or changes patient will contact our office for additional recommendations. Electronic Signature(s) Signed: 08/18/2018 5:50:45 PM By: Lenda Kelp PA-C Entered By: Lenda Kelp on 08/18/2018 17:16:18 [place], [name] ([id number]) -------------------------------------------------------------------------------- ROS/PFSH Details Patient Name: Marcia [name] Date of Service: 08/18/2018 9:30 AM Medical Record Number: [id number] Patient Account Number: [id number] Date of Birth/Sex: [birth year] (72 y.o. F) Treating RN: Curtis Sites Primary Care Provider: Sherrie Mustache Other Clinician: Referring Provider: Sherrie Mustache Treating Provider/Extender: [name], Marcia Weeks in Treatment: 14 Information Obtained From Patient Wound History Do you currently have one or more open woundso Yes How many open wounds do you currently haveo 5 Approximately how long have you had your woundso 6 How have you been treating your wound(s) until nowo abx Has your wound(s) ever healed and then re-openedo No Have you had any lab work done in the past montho Yes Who ordered the lab work doneo hospital Have you tested positive for an antibiotic resistant organism (MRSA, VRE)o No Have you tested positive for osteomyelitis (bone infection)o No Have you had any tests for circulation on your legso Yes Constitutional Symptoms (General Health) Complaints and Symptoms: Negative for: Fever; Chills; Marked Weight Change Medical History: Past Medical History Notes: vitamin d deficiency Cardiovascular Complaints and Symptoms: Positive for: LE edema Medical History: Positive for: Hypertension Negative for: Angina; Arrhythmia; Congestive Heart Failure; Coronary Artery Disease; Deep Vein  Thrombosis; Hypotension; Myocardial Infarction; Peripheral Arterial Disease; Peripheral Venous Disease; Phlebitis; Vasculitis Past Medical History Notes: dyspnea 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, [place] [facility] Medical History: Negative for: Cataracts; Glaucoma; Optic Neuritis Ear/Nose/Mouth/Throat Medical History: Negative for: Chronic sinus problems/congestion; Middle ear problems Hematologic/Lymphatic REBA, HULETT A. ([id number]) Medical History: Negative for: Anemia; Hemophilia; Human Immunodeficiency Virus; Lymphedema; Sickle Cell Disease Respiratory Complaints and Symptoms:  No Complaints or Symptoms Medical History: Negative for: Aspiration; Asthma; Chronic Obstructive Pulmonary Disease (COPD); Pneumothorax; Sleep Apnea; Tuberculosis Past Medical History Notes: nodule of lung Gastrointestinal Medical History: Negative for: Cirrhosis ; Colitis; Crohnos; Hepatitis A; Hepatitis B; Hepatitis C Endocrine Medical History: Positive for: Type II Diabetes Time with diabetes: 10 years Treated with: Oral agents Blood sugar tested every day: Yes Tested : every mornimg Genitourinary Medical History: Negative for: End Stage Renal Disease Immunological Medical History: Negative for: Lupus Erythematosus; Raynaudos; Scleroderma Integumentary (Skin) Medical History: Negative for: History of Burn; History of pressure wounds Musculoskeletal Medical History: Negative for: Gout; Rheumatoid Arthritis; Osteoarthritis; Osteomyelitis Past Medical History Notes: arthritis Neurologic Medical History: Positive for: Neuropathy - peripheral Negative for: Dementia; Quadriplegia; Paraplegia; Seizure Disorder Oncologic JOSLYNN, JOSHUA ([id number]) Medical History: Negative for: Received Chemotherapy; Received Radiation Psychiatric Complaints and Symptoms: No Complaints or Symptoms Medical History: Negative for: Anorexia/bulimia; Confinement Anxiety Immunizations Pneumococcal  Vaccine: Received Pneumococcal Vaccination: Yes Tetanus Vaccine: Last tetanus shot: 05/12/2014 Immunization Notes: plan on flu shot Implantable Devices Family and Social History Cancer: Yes - Siblings; Diabetes: Yes - Mother,Father,Siblings; Heart Disease: Yes - Father; Hypertension: Yes - Father; Kidney Disease: No; Lung Disease: No; Seizures: No; Stroke: Yes - Father,Mother; Thyroid Problems: No; Tuberculosis: No; Former smoker - 10-15 years quit; Marital Status - Married; Alcohol Use: Never; Drug Use: No History; Caffeine Use: Daily; Financial Concerns: No; Food, Clothing or Shelter Needs: No; Support System Lacking: No; Transportation Concerns: No; Advanced Directives: No; Patient does not want information on Advanced Directives; Living Will: No; Medical Power of Attorney: No Physician Affirmation I have reviewed and agree with the above information. Electronic Signature(s) Signed: 08/18/2018 5:50:45 PM By: Lenda Kelp PA-C Signed: 08/19/2018 5:11:22 PM By: Curtis Sites Entered By: Lenda Kelp on 08/18/2018 17:15:56 [place]is[place], [name] ([id number]) -------------------------------------------------------------------------------- SuperBill Details Patient Name: Marcia [name] Date of Service: 08/18/2018 Medical Record Number: [id number] Patient Account Number: [id number] Date of Birth/Sex: 08-08-1946 (72 y.o. 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 L97.212 Non-pressure chronic ulcer of right calf with fat layer exposed L97.312 Non-pressure chronic ulcer of right ankle with fat layer exposed E11.622 Type 2 diabetes mellitus with other skin ulcer Facility Procedures CPT4 Code: 07371062 Description: 11042 - DEB SUBQ TISSUE 20 SQ CM/< ICD-10 Diagnosis  Description L97.212 Non-pressure chronic ulcer of right calf with fat layer exp Modifier: osed Quantity: 1 Physician Procedures CPT4 Code: [id number] Description: 11042 - WC PHYS SUBQ TISS 20 SQ CM ICD-10 Diagnosis Description L97.212 Non-pressure chronic ulcer of right calf with fat layer exp Modifier: osed Quantity: 1 Electronic Signature(s) Signed: 08/18/2018 5:50:45 PM By: Lenda Kelp PA-C Entered By: Lenda Kelp on 08/18/2018 17:17:18

## 2018-09-01 ENCOUNTER — Ambulatory Visit: Payer: Medicare HMO | Admitting: Physician Assistant

## 2018-09-08 ENCOUNTER — Encounter: Payer: Medicare HMO | Attending: Physician Assistant | Admitting: Physician Assistant

## 2018-09-08 DIAGNOSIS — L950 Livedoid vasculitis: Secondary | ICD-10-CM | POA: Insufficient documentation

## 2018-09-08 DIAGNOSIS — L97212 Non-pressure chronic ulcer of right calf with fat layer exposed: Secondary | ICD-10-CM | POA: Insufficient documentation

## 2018-09-08 DIAGNOSIS — E1140 Type 2 diabetes mellitus with diabetic neuropathy, unspecified: Secondary | ICD-10-CM | POA: Diagnosis not present

## 2018-09-08 DIAGNOSIS — L97312 Non-pressure chronic ulcer of right ankle with fat layer exposed: Secondary | ICD-10-CM | POA: Insufficient documentation

## 2018-09-08 DIAGNOSIS — I10 Essential (primary) hypertension: Secondary | ICD-10-CM | POA: Diagnosis not present

## 2018-09-08 DIAGNOSIS — Z87891 Personal history of nicotine dependence: Secondary | ICD-10-CM | POA: Insufficient documentation

## 2018-09-08 DIAGNOSIS — E11622 Type 2 diabetes mellitus with other skin ulcer: Secondary | ICD-10-CM | POA: Insufficient documentation

## 2018-09-08 DIAGNOSIS — Z8249 Family history of ischemic heart disease and other diseases of the circulatory system: Secondary | ICD-10-CM | POA: Insufficient documentation

## 2018-09-08 DIAGNOSIS — E559 Vitamin D deficiency, unspecified: Secondary | ICD-10-CM | POA: Insufficient documentation

## 2018-09-08 DIAGNOSIS — E11621 Type 2 diabetes mellitus with foot ulcer: Secondary | ICD-10-CM | POA: Diagnosis not present

## 2018-09-08 DIAGNOSIS — Z7984 Long term (current) use of oral hypoglycemic drugs: Secondary | ICD-10-CM | POA: Insufficient documentation

## 2018-09-12 NOTE — Progress Notes (Signed)
[name], [name] ([id number]) Visit Report for 09/08/2018 Arrival Information Details Patient Name: [name], [name] Date of Service: 09/08/2018 10:15 AM Medical Record Number: [id number] Patient Account Number: [id number] Date of Birth/Sex: April 15, 1946 (72 y.o. F) Treating RN: Huel Coventry Primary Care Jacson Rapaport: Sherrie Mustache Other Clinician: Referring Nikie Cid: Sherrie Mustache Treating Dawsen Krieger/Extender: Linwood Dibbles, HOYT Weeks in Treatment: 17 Visit Information History Since Last Visit Added or deleted any medications: No Patient Arrived: Ambulatory Any new allergies or adverse reactions: No Arrival Time: 10:32 Had a fall or experienced change in No Accompanied By: self activities of daily living that may affect Transfer Assistance: None risk of falls: Patient Identification Verified: Yes Signs or symptoms of abuse/neglect since last visito No Secondary Verification Process Completed: Yes Hospitalized since last visit: No Patient Requires Transmission-Based No Implantable device outside of the clinic excluding No Precautions: cellular tissue based products placed in the center Patient Has Alerts: Yes since last visit: Patient Alerts: DMII Has Dressing in Place as Prescribed: Yes Pain Present Now: No Electronic Signature(s) Signed: 09/08/2018 5:26:41 PM By: Elliot Gurney, BSN, RN, CWS, Kim RN, BSN Entered By: Elliot Gurney, BSN, RN, CWS, Kim on 09/08/2018 10:32:17 Somero, [name] ([id number]) -------------------------------------------------------------------------------- Encounter Discharge Information Details Patient Name: Marcia [name] Date of Service: 09/08/2018 10:15 AM Medical Record Number: [id number] Patient Account Number: [id number] Date of Birth/Sex: 1946-09-21 (72 y.o. F) Treating RN: Curtis Sites Primary Care Kinston Magnan: Sherrie Mustache Other Clinician: Referring Jahki Witham: Sherrie Mustache Treating Lurlie Wigen/Extender: Linwood Dibbles, HOYT Weeks in Treatment: 17 Encounter Discharge Information  Items Post Procedure Vitals Discharge Condition: Stable Temperature (F): 98.7 Ambulatory Status: Ambulatory Pulse (bpm): 68 Discharge Destination: Home Respiratory Rate (breaths/min): 16 Transportation: Private Auto Blood Pressure (mmHg): 135/64 Accompanied By: self Schedule Follow-up Appointment: Yes Clinical Summary of Care: Electronic Signature(s) Signed: 09/08/2018 5:21:38 PM By: Curtis Sites Entered By: Curtis Sites on 09/08/2018 11:02:14 Wilczynski, [name] ([id number]) -------------------------------------------------------------------------------- Lower Extremity Assessment Details Patient Name: Marcia [name] Date of Service: 09/08/2018 10:15 AM Medical Record Number: [id number] Patient Account Number: [id number] Date of Birth/Sex: [birth year] (72 y.o. F) Treating RN: Huel Coventry Primary Care Anielle Headrick: Sherrie Mustache Other Clinician: Referring Yoshua Geisinger: Sherrie Mustache Treating Kolter Reaver/Extender: Linwood Dibbles, HOYT Weeks in Treatment: 17 Edema Assessment Assessed: [Left: No] [Right: No] Edema: [Left: Ye] [Right: s] Vascular Assessment Pulses: Dorsalis Pedis Palpable: [Right:Yes] Posterior Tibial Extremity colors, hair growth, and conditions: Extremity Color: [Right:Normal] Hair Growth on Extremity: [Right:Yes] Temperature of Extremity: [Right:Warm] Capillary Refill: [Right:< 3 seconds] Toe Nail Assessment Left: Right: Thick: Yes Discolored: No Deformed: No Improper Length and Hygiene: No Electronic Signature(s) Signed: 09/08/2018 5:26:41 PM By: Elliot Gurney, BSN, RN, CWS, Kim RN, BSN Entered By: Elliot Gurney, BSN, RN, CWS, Kim on 09/08/2018 10:41:24 [name], [name] ([id number]) -------------------------------------------------------------------------------- Multi Wound Chart Details Patient Name: Marcia [name] Date of Service: 09/08/2018 10:15 AM Medical Record Number: [id number] Patient Account Number: [id number] Date of Birth/Sex: 02-12-1946 (72 y.o. F) Treating RN: Curtis Sites Primary Care Tramond Slinker: Sherrie Mustache Other Clinician: Referring Jariah Jarmon: Sherrie Mustache Treating Emanuelle Hammerstrom/Extender: Linwood Dibbles, HOYT Weeks in Treatment: 17 Vital Signs Height(in): 70 Pulse(bpm): 68 Weight(lbs): 193 Blood Pressure(mmHg): 135/64 Body Mass Index(BMI): 28 Temperature(F): 98.7 Respiratory Rate 16 (breaths/min): Photos: [3:No Photos] [5:No Photos] [N/A:N/A] Wound Location: [3:Right Lower Leg - Posterior] [5:Right, Distal, Lateral Lower Leg] [N/A:N/A] Wounding Event: [3:Gradually Appeared] [5:Gradually Appeared] [N/A:N/A] Primary Etiology: [3:Vasculitis] [5:Diabetic Wound/Ulcer of the Lower Extremity] [N/A:N/A] Secondary Etiology: [3:Diabetic Wound/Ulcer of the Lower Extremity] [5:N/A] [N/A:N/A] Comorbid History: [3:Hypertension, Type II Diabetes, Neuropathy] [5:N/A] [N/A:N/A] Date Acquired: [  3:03/12/2018] [5:06/28/2018] [N/A:N/A] Weeks of Treatment: [3:17] [5:10] [N/A:N/A] Wound Status: [3:Open] [5:Healed - Epithelialized] [N/A:N/A] Clustered Wound: [3:No] [5:Yes] [N/A:N/A] Measurements L x W x D [3:1x0.9x0.1] [5:0x0x0] [N/A:N/A] (cm) Area (cm) : [3:0.707] [5:0] [N/A:N/A] Volume (cm) : [3:0.071] [5:0] [N/A:N/A] % Reduction in Area: [3:33.30%] [5:100.00%] [N/A:N/A] % Reduction in Volume: [3:33.00%] [5:100.00%] [N/A:N/A] Classification: [3:Full Thickness Without Exposed Support Structures] [5:Grade 1] [N/A:N/A] Exudate Amount: [3:None Present] [5:N/A] [N/A:N/A] Wound Margin: [3:Flat and Intact] [5:N/A] [N/A:N/A] Granulation Amount: [3:None Present (0%)] [5:N/A] [N/A:N/A] Necrotic Amount: [3:Large (67-100%)] [5:N/A] [N/A:N/A] Necrotic Tissue: [3:Eschar] [5:N/A] [N/A:N/A] Exposed Structures: [3:Fascia: No Fat Layer (Subcutaneous Tissue) Exposed: No Tendon: No Muscle: No Joint: No Bone: No Limited to Skin Breakdown] [5:N/A] [N/A:N/A] Periwound Skin Texture: [3:Excoriation: No Induration: No] [5:No Abnormalities Noted] [N/A:N/A] Callus: No Crepitus: No Rash:  No Scarring: No Periwound Skin Moisture: Maceration: No No Abnormalities Noted N/A Dry/Scaly: No Periwound Skin Color: Atrophie Blanche: No No Abnormalities Noted N/A Cyanosis: No Ecchymosis: No Erythema: No Hemosiderin Staining: No Mottled: No Pallor: No Rubor: No Tenderness on Palpation: No No N/A Wound Preparation: Ulcer Cleansing: N/A N/A Rinsed/Irrigated with Saline Topical Anesthetic Applied: Other: lidocaine 4% Treatment Notes Electronic Signature(s) Signed: 09/08/2018 5:21:38 PM By: Curtis Sites Entered By: Curtis Sites on 09/08/2018 10:48:10 [name], [name] ([id number]) -------------------------------------------------------------------------------- Multi-Disciplinary Care Plan Details Patient Name: Marcia [name] Date of Service: 09/08/2018 10:15 AM Medical Record Number: [id number] Patient Account Number: [id number] Date of Birth/Sex: 04-08-1946 (72 y.o. F) Treating RN: Curtis Sites Primary Care Beauregard Jarrells: Sherrie Mustache Other Clinician: Referring Danta Baumgardner: Sherrie Mustache Treating Constance Whittle/Extender: Linwood Dibbles, HOYT Weeks in Treatment: 17 Active Inactive Abuse / Safety / Falls / Self Care Management Nursing Diagnoses: Potential for falls Goals: Patient will not experience any injury related to falls Date Initiated: 05/12/2018 Target Resolution Date: 08/15/2018 Goal Status: Active Interventions: Assess Activities of Daily Living upon admission and as needed Assess fall risk on admission and as needed Assess: immobility, friction, shearing, incontinence upon admission and as needed Assess impairment of mobility on admission and as needed per policy Assess personal safety and home safety (as indicated) on admission and as needed Notes: Nutrition Nursing Diagnoses: Imbalanced nutrition Impaired glucose control: actual or potential Goals: Patient/caregiver agrees to and verbalizes understanding of need to use nutritional supplements and/or vitamins as  prescribed Date Initiated: 05/12/2018 Target Resolution Date: 09/12/2018 Goal Status: Active Patient/caregiver will maintain therapeutic glucose control Date Initiated: 05/12/2018 Target Resolution Date: 08/15/2018 Goal Status: Active Interventions: Assess patient nutrition upon admission and as needed per policy Provide education on elevated blood sugars and impact on wound healing Provide education on nutrition Notes: Orientation to the Wound Care Program [name], [name] ([id number]) Nursing Diagnoses: Knowledge deficit related to the wound healing center program Goals: Patient/caregiver will verbalize understanding of the Wound Healing Center Program Date Initiated: 05/12/2018 Target Resolution Date: 06/13/2018 Goal Status: Active Interventions: Provide education on orientation to the wound center Notes: Wound/Skin Impairment Nursing Diagnoses: Impaired tissue integrity Knowledge deficit related to ulceration/compromised skin integrity Goals: Ulcer/skin breakdown will have a volume reduction of 80% by week 12 Date Initiated: 05/12/2018 Target Resolution Date: 09/05/2018 Goal Status: Active Interventions: Assess patient/caregiver ability to perform ulcer/skin care regimen upon admission and as needed Assess ulceration(s) every visit Notes: Electronic Signature(s) Signed: 09/08/2018 5:21:38 PM By: Curtis Sites Entered By: Curtis Sites on 09/08/2018 10:47:28 [name], [name] Kitchen ([id number]) -------------------------------------------------------------------------------- Pain Assessment Details Patient Name: Marcia [name] Date of Service: 09/08/2018 10:15 AM Medical Record Number: [id number] Patient Account Number: [id number] Date  of Birth/Sex: 1946-04-08 (72 y.o. F) Treating RN: Huel Coventry Primary Care Chucky Homes: Sherrie Mustache Other Clinician: Referring Calista Crain: Sherrie Mustache Treating Terrilee Dudzik/Extender: Linwood Dibbles, HOYT Weeks in Treatment: 17 Active Problems Location of Pain  Severity and Description of Pain Patient Has Paino No Site Locations Pain Management and Medication Current Pain Management: Goals for Pain Management Patient states she has pain at night and feels like it may be from the neuropathy. Electronic Signature(s) Signed: 09/08/2018 5:26:41 PM By: Elliot Gurney, BSN, RN, CWS, Kim RN, BSN Entered By: Elliot Gurney, BSN, RN, CWS, Kim on 09/08/2018 10:32:50 [name], [name] ([id number]) -------------------------------------------------------------------------------- Patient/Caregiver Education Details Patient Name: Marcia [name] Date of Service: 09/08/2018 10:15 AM Medical Record Number: [id number] Patient Account Number: [id number] Date of Birth/Gender: Jul 19, 1946 (72 y.o. F) Treating RN: Curtis Sites Primary Care Physician: Sherrie Mustache Other Clinician: Referring Physician: Sherrie Mustache Treating Physician/Extender: Skeet Simmer in Treatment: 17 Education Assessment Education Provided To: Patient Education Topics Provided Wound/Skin Impairment: Handouts: Other: wound care as ordered Methods: Demonstration, Explain/Verbal Responses: State content correctly Electronic Signature(s) Signed: 09/08/2018 5:21:38 PM By: Curtis Sites Entered By: Curtis Sites on 09/08/2018 10:59:37 [name], [name] Kitchen ([id number]) -------------------------------------------------------------------------------- Wound Assessment Details Patient Name: Marcia [name] Date of Service: 09/08/2018 10:15 AM Medical Record Number: [id number] Patient Account Number: [id number] Date of Birth/Sex: [birth year] (72 y.o. F) Treating RN: Huel Coventry Primary Care Pauline Trainer: Sherrie Mustache Other Clinician: Referring Ily Denno: Sherrie Mustache Treating Larae Caison/Extender: Linwood Dibbles, HOYT Weeks in Treatment: 17 Wound Status Wound Number: 3 Primary Etiology: Vasculitis Wound Location: Right Lower Leg - Posterior Secondary Diabetic Wound/Ulcer of the Lower Etiology: Extremity Wounding  Event: Gradually Appeared Wound Status: Healed - Epithelialized Date Acquired: 03/12/2018 Comorbid History: Hypertension, Type II Diabetes, Weeks Of Treatment: 17 Neuropathy Clustered Wound: No Wound Measurements Length: (cm) 0 Width: (cm) 0 Depth: (cm) 0 Area: (cm) 0 Volume: (cm) 0 % Reduction in Area: 100% % Reduction in Volume: 100% Wound Description Full Thickness Without Exposed Support Foul O Classification: Structures Slough Wound Margin: Flat and Intact Exudate None Present Amount: dor After Cleansing: No /Fibrino No Wound Bed Granulation Amount: None Present (0%) Exposed Structure Necrotic Amount: Large (67-100%) Fascia Exposed: No Necrotic Quality: Eschar Fat Layer (Subcutaneous Tissue) Exposed: No Tendon Exposed: No Muscle Exposed: No Joint Exposed: No Bone Exposed: No Limited to Skin Breakdown Periwound Skin Texture Texture Color No Abnormalities Noted: No No Abnormalities Noted: No Callus: No Atrophie Blanche: No Crepitus: No Cyanosis: No Excoriation: No Ecchymosis: No Induration: No Erythema: No Rash: No Hemosiderin Staining: No Scarring: No Mottled: No Pallor: No Moisture Rubor: No No Abnormalities Noted: No Dry / Scaly: No Maceration: No Walberg, [name] ([id number]) Wound Preparation Ulcer Cleansing: Rinsed/Irrigated with Saline Topical Anesthetic Applied: Other: lidocaine 4%, Electronic Signature(s) Signed: 09/08/2018 10:50:54 AM By: Elliot Gurney, BSN, RN, CWS, Kim RN, BSN Entered By: Elliot Gurney, BSN, RN, CWS, Kim on 09/08/2018 10:50:54 [name], [name] ([id number]) -------------------------------------------------------------------------------- Wound Assessment Details Patient Name: Marcia [name] Date of Service: 09/08/2018 10:15 AM Medical Record Number: [id number] Patient Account Number: [id number] Date of Birth/Sex: Oct 29, 1945 (72 y.o. F) Treating RN: Huel Coventry Primary Care Alzora Ha: Sherrie Mustache Other Clinician: Referring Dominique Ressel:  Sherrie Mustache Treating Kerria Sapien/Extender: Linwood Dibbles, HOYT Weeks in Treatment: 17 Wound Status Wound Number: 5 Primary Diabetic Wound/Ulcer of the Lower Etiology: Extremity Wound Location: Right, Distal, Lateral Lower Leg Wound Status: Healed - Epithelialized Wounding Event: Gradually Appeared Date Acquired: 06/28/2018 Weeks Of Treatment: 10 Clustered Wound: Yes Photos Photo Uploaded By: Elliot Gurney, BSN, RN, CWS,  Kim on 09/08/2018 10:50:28 Wound Measurements Length: (cm) 1 Width: (cm) 0.9 Depth: (cm) 0.1 Area: (cm) 0 Volume: (cm) 0 % Reduction in Area: 100% % Reduction in Volume: 100% Wound Description Classification: Grade 1 Periwound Skin Texture Texture Color No Abnormalities Noted: No No Abnormalities Noted: No Moisture No Abnormalities Noted: No Electronic Signature(s) Signed: 09/08/2018 5:26:41 PM By: Elliot Gurney, BSN, RN, CWS, Kim RN, BSN Entered By: Elliot Gurney, BSN, RN, CWS, Kim on 09/08/2018 10:48:14 [name], [name] ([id number]) -------------------------------------------------------------------------------- Vitals Details Patient Name: Marcia [name] Date of Service: 09/08/2018 10:15 AM Medical Record Number: [id number] Patient Account Number: [id number] Date of Birth/Sex: 10-12-1945 (72 y.o. F) Treating RN: Huel Coventry Primary Care Barbi Kumagai: Sherrie Mustache Other Clinician: Referring Quan Cybulski: Sherrie Mustache Treating Ciel Yanes/Extender: Linwood Dibbles, HOYT Weeks in Treatment: 17 Vital Signs Time Taken: 10:34 Temperature (F): 98.7 Height (in): 70 Pulse (bpm): 68 Weight (lbs): 193 Respiratory Rate (breaths/min): 16 Body Mass Index (BMI): 27.7 Blood Pressure (mmHg): 135/64 Reference Range: 80 - 120 mg / dl Electronic Signature(s) Signed: 09/08/2018 5:26:41 PM By: Elliot Gurney, BSN, RN, CWS, Kim RN, BSN Entered By: Elliot Gurney, BSN, RN, CWS, Kim on 09/08/2018 10:35:13

## 2018-09-12 NOTE — Progress Notes (Signed)
SADDIE, VAGO ([id number]) Visit Report for 09/08/2018 Chief Complaint Document Details Patient Name: [name], [name] Date of Service: 09/08/2018 10:15 AM Medical Record Number: [id number] Patient Account Number: [id number] Date of Birth/Sex: 1946/04/28 (72 y.o. F) Treating RN: Curtis Sites Primary Care Provider: Sherrie Mustache Other Clinician: Referring Provider: Sherrie Mustache Treating Provider/Extender: Skeet Simmer in Treatment: 17 Information Obtained from: Patient Chief Complaint RLE wounds Electronic Signature(s) Signed: 09/10/2018 1:15:36 AM By: Lenda Kelp PA-C Entered By: Lenda Kelp on 09/08/2018 10:28:07 [name], [name] ([id number]) -------------------------------------------------------------------------------- Debridement Details Patient Name: Marcia [name] Date of Service: 09/08/2018 10:15 AM Medical Record Number: [id number] Patient Account Number: [id number] Date of Birth/Sex: June 13, 1946 (72 y.o. F) Treating RN: Curtis Sites Primary Care Provider: Sherrie Mustache Other Clinician: Referring Provider: Sherrie Mustache Treating Provider/Extender: Linwood Dibbles, HOYT Weeks in Treatment: 17 Debridement Performed for Wound #5 Right,Distal,Lateral Lower Leg Assessment: Performed By: Physician STONE III, HOYT E., PA-C Debridement Type: Debridement Severity of Tissue Pre Fat layer exposed Debridement: Level of Consciousness (Pre- Awake and Alert procedure): Pre-procedure Verification/Time Yes - 10:49 Out Taken: Start Time: 10:49 Pain Control: Lidocaine 4% Topical Solution Total Area Debrided (L x W): 1 (cm) x 0.9 (cm) = 0.9 (cm) Tissue and other material Viable, Non-Viable, Slough, Subcutaneous, Slough debrided: Level: Skin/Subcutaneous Tissue Debridement Description: Excisional Instrument: Curette Bleeding: Minimum Hemostasis Achieved: Pressure End Time: 10:53 Procedural Pain: 0 Post Procedural Pain: 0 Response to Treatment: Procedure was  tolerated well Level of Consciousness Awake and Alert (Post-procedure): Post Debridement Measurements of Total Wound Length: (cm) 1 Width: (cm) 0.9 Depth: (cm) 0.2 Volume: (cm) 0.141 Character of Wound/Ulcer Post Debridement: Improved Severity of Tissue Post Debridement: Fat layer exposed Post Procedure Diagnosis Same as Pre-procedure Electronic Signature(s) Signed: 09/08/2018 5:21:38 PM By: Curtis Sites Signed: 09/10/2018 1:15:36 AM By: Lenda Kelp PA-C Entered By: Curtis Sites on 09/08/2018 10:53:31 [name], [name] ([id number]) -------------------------------------------------------------------------------- HPI Details Patient Name: Marcia [name] Date of Service: 09/08/2018 10:15 AM Medical Record Number: [id number] Patient Account Number: [id number] Date of Birth/Sex: Jul 11, 1946 (72 y.o. F) Treating RN: Curtis Sites Primary Care Provider: Sherrie Mustache Other Clinician: Referring Provider: Sherrie Mustache Treating Provider/Extender: Linwood Dibbles, HOYT Weeks in Treatment: 17 History of Present Illness HPI Description: 05/12/18-She is seen in initial evaluation for multiple open areas to the right lower extremity. She was recently hospitalized (7/30-8/2) with cellulitis of the right leg, failed outpatient therapy with Keflex. While hospitalized she was treated with IV vancomycin and cefepime; right foot x-ray was negative for osteomyelitis. She admits to intermittent pain, improved since hospitalization. She was discharged on doxycycline which she should complete with the next 48 hours. 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 She has a remote history of following with vascular medicine for venous reflux, she does not tolerate wearing compression stockings d/t skin sensitivities and "burning" sensation. In office ABI are elevated but she has strongly palpable pulses. We will initiate  hydrofera blue and ace wrap compression. She is a diabetic with A1c 6.9 while hospitalized. 05/19/18- She is seen in follow up evaluation for RLE wounds; the lateral area is healed. She did tolerate ace wrap for compression, but experienced increased xerosis. We will hold off on ace wrap and increase lachydrin to twice daily. She c/o pain to the medial cluster of wounds, there is no apparent infection and will initiate steroid cream. She will follow up next week 05/26/18  on evaluation today patient appears to actually be having some issues currently with pain. She has been utilizing the dressings as previously recommended for the lower extremities. With that being said she does appear to have a little bit of a rash she states that anytime it he's in for anything else touches her skin that she will often develop a rash. She's not even sure that should be able to tolerate a compression wrap due to the fact that again she would be concerned about even the cotton layer being in contact with her skin which overtime will cause a burning sensation. She has seen dermatology concerning this unfortunately they really did not have a good answer for her as to why this is occurring. Fortunately there is no evidence of infection at this time. 05/29/18 on evaluation today patient was actually supposed to be back in the office in order to have a dressings/wrap change is a nurse visit. With that being said she was having significant discomfort with want to touch around the wound and. It green discharge. Subsequently the concern was that she had an infection so we did convert her to a physician visit instead of just a nurse visit. Subsequently upon evaluation she does seem to have purulent discharge along with evidence of cellulitis extending up the leg which is definitely worse than when I saw her earlier in the week. Fortunately there does not appear to be any evidence of systemic infection at this point. No fevers,  chills, nausea, or vomiting noted at this time. 06/02/18 on evaluation today the patient appears to be doing better in regard to the overall appearance in regard to the wound bed. She still does have slough covering the surface of the wounds and due to pain really I do not think sharp debridement is going to be advisable at this point. Nonetheless I do feel like that she is definitely showing signs of improvement compared to her previous evaluation. Her pain level is still rather high she tells me at times 8-9/10. Unfortunately she is having a difficult time getting into pain management she states that the one that we referred her to stated it would be at least two weeks before they can get her in. 06/12/18 on evaluation today patient actually appears to be doing much better in regard to her lower extremity wounds. In fact there does not appear to be any evidence of infection at this time I do believe the Cipro has been effective her pain is also greatly improved compared to previous. Nonetheless she does have some Slough noted that is gonna require debridement. Patient's MRI was reviewed and revealed that she had no evidence of osteomyelitis, septic arthritis, or absence. She did have blisters noted on the surface of the foot which again visually we see today as well. 06/16/18 on evaluation today patient actually appears to be doing fairly well in regard to her lower extremity ulcerations. With that being said she does have issues at this point with discomfort although she did see the pain management physician this morning he did give her medication to help in this regard. She tells me that after I debrided her wound last week that she actually had significant pain to the point that she ended up having a difficult time even driving to get home. Obviously when she left the clinic she was not in that much pain this somewhat surprises me especially since we put lidocaine on her wounds prior to applying  the dressings and the dressing  we were using was Prisma which is not notorious for causing discomfort and Sanger, Crystel A. ([id number]) burning. Nonetheless she did obviously experience this and she tells me that she set intermittent spells where this occurred in the week since I last saw her. Nonetheless she has been given the pain medications necessary in order to keep things under control at this point. Obviously this is short term but nonetheless I think will be helpful in getting the wounds to heal so that we can appropriately debrided do what we need to do. 06/23/18 on evaluation today patient actually appears to be doing significantly better in regard to her right lower extremity ulcerations. In fact she has made dramatic improvement pretty much all locations which is great news. Fortunately there's no evidence of infection at this time. Her irritation and inflammation also is better than I do believe that the treatment utilizing the Santyl has been beneficial over the past week. I'm not even sure that we need that any further. 06/30/18 on evaluation today patient actually is doing worse compared to her previous evaluation. Unfortunately she's been having increased drainage, pain, and overall I feel like this is likely due to infection based on the appearance of her wound today. In general the patient seems to have digressed although not really back to where she started although definitely not as well she's been doing in the past couple of weeks. She's not having green drainage like she did previous but she is having a lot of drainage to the point that she's actually dripping from the posterior portion of her leg at this time. 07/07/18 on evaluation today patient actually appears to be doing rather well in regard to her right lower extremity ulcers. They also do measuring smaller which is good news she has a lot of dry and cracked skin which is gonna make her more prone to reinfection we need to  work on this. Nonetheless I do believe that in general the patient has been doing well overall with her current dressing changes and I do believe the doxycycline was beneficial she did have a Staphylococcus aureus infection fortunately this was not MRSA. The doxycycline also is appropriate for this infection. 07/21/18 on evaluation today patient's wound bed actually shows evidence of good improvement. This is really in regard to all locations. There was some dried dressing/eschar covering several of the areas but fortunately she's not having the amount of pain that she previously was experiencing. No fevers, chills, nausea, or vomiting noted at this time. 08/04/18 on evaluation today patient appears to be doing very well in regard to her right lower extremity ulcers. She's been tolerating the dressing changes without complication. Fortunately there does not appear to be any evidence of infection at this time which is excellent news. Overall very pleased with the progress that has been made. The patient is having much less pain she's even been able to cut back a little bit of the pain medication she was taking roughly 3 a day on a regular basis she struck back to two and still seems to be doing well. Overall that is great news. 08/18/18 on evaluation today patient's lower extremity wounds actually both appear to be doing excellent at this time. She has been tolerating the dressing changes without complication. Overall I've been extremely pleased with the progress that she's made. The patient likewise is having much less pain and is very happy as well. 09/08/18 on evaluation today patient actually appears to be doing excellent in regard  to her right lower extremity ulcers. She has been tolerating the dressing changes without complication which is great news. Overall I've been extremely pleased with the progress that she has made. With that being said she does still continue to have some issue and it  comes to getting this last small area to heal we've had some trouble in this regard. Nonetheless I do believe that she will do very well following a good debridement today. Electronic Signature(s) Signed: 09/10/2018 1:15:36 AM By: Lenda Kelp PA-C Entered By: Lenda Kelp on 09/09/2018 21:16:25 [name], [name] ([id number]) -------------------------------------------------------------------------------- Physical Exam Details Patient Name: Marcia [name] Date of Service: 09/08/2018 10:15 AM Medical Record Number: [id number] Patient Account Number: [id number] Date of Birth/Sex: 1946/03/13 (72 y.o. F) Treating RN: Curtis Sites Primary Care Provider: Sherrie Mustache Other Clinician: Referring Provider: Sherrie Mustache Treating Provider/Extender: Linwood Dibbles, HOYT Weeks in Treatment: 17 Constitutional Well-nourished and well-hydrated in no acute distress. Respiratory normal breathing without difficulty. Psychiatric this patient is able to make decisions and demonstrates good insight into disease process. Alert and Oriented x 3. pleasant and cooperative. Notes Patient's wound bed currently did show some Slough noted on the surface of the wound which require sharp debridement. This was performed today without complication post debridement the wound bed appears to be doing significantly better. Electronic Signature(s) Signed: 09/10/2018 1:15:36 AM By: Lenda Kelp PA-C Entered By: Lenda Kelp on 09/09/2018 21:17:37 Zeis, [name] ([id number]) -------------------------------------------------------------------------------- Physician Orders Details Patient Name: Marcia [name] Date of Service: 09/08/2018 10:15 AM Medical Record Number: [id number] Patient Account Number: [id number] Date of Birth/Sex: 12/02/1945 (72 y.o. 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 Skin Barriers/Peri-Wound Care Wound #5 Right,Distal,Lateral Lower Leg o Moisturizing lotion Primary Wound Dressing Wound #5 Right,Distal,Lateral Lower Leg o Silver Collagen Secondary Dressing Wound #5 Right,Distal,Lateral Lower Leg o ABD and Kerlix/Conform Dressing Change Frequency Wound #5 Right,Distal,Lateral Lower Leg o Change dressing every other day. Follow-up Appointments Wound #5 Right,Distal,Lateral Lower Leg o Return Appointment in 2 weeks. Edema Control Wound #5 Right,Distal,Lateral Lower Leg o Elevate legs to the level of the heart and pump ankles as often as possible Electronic Signature(s) Signed: 09/08/2018 5:21:38 PM By: Curtis Sites Signed: 09/10/2018 1:15:36 AM By: Lenda Kelp PA-C Entered By: Curtis Sites on 09/08/2018 10:54:02 [name], [name] ([id number]) [name], [name] Kitchen ([id number]) -------------------------------------------------------------------------------- Problem List Details Patient Name: Marcia [name] Date of Service: 09/08/2018 10:15 AM Medical Record Number: [id number] Patient Account Number: [id number] Date of Birth/Sex: 05-15-1946 (72 y.o. 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 05/12/2018 No Yes L97.212 Non-pressure  chronic ulcer of right calf with fat layer exposed 05/12/2018 No Yes L97.312 Non-pressure chronic ulcer of right ankle with fat layer 05/12/2018 No Yes exposed E11.622 Type 2 diabetes mellitus with other skin ulcer 05/12/2018 No Yes Inactive Problems Resolved  Problems Electronic Signature(s) Signed: 09/10/2018 1:15:36 AM By: Lenda Kelp PA-C Entered By: Lenda Kelp on 09/08/2018 10:28:01 [name], [name] Kitchen ([id number]) -------------------------------------------------------------------------------- Progress Note Details Patient Name: Marcia [name] Date of Service: 09/08/2018 10:15 AM Medical Record Number: [id number] Patient Account Number: [id number] Date of Birth/Sex: 1946/04/22 (72 y.o. F) Treating RN: Curtis Sites Primary Care Provider: Sherrie Mustache Other Clinician: Referring Provider: Sherrie Mustache Treating Provider/Extender: Linwood Dibbles, HOYT Weeks in Treatment: 17 Subjective Chief Complaint Information obtained from Patient RLE wounds History of Present Illness (HPI) 05/12/18-She is seen in initial evaluation for multiple open areas to the right lower extremity. She was recently hospitalized (7/30-8/2) with cellulitis of the right leg, failed outpatient therapy with Keflex. While hospitalized she was treated with IV vancomycin and cefepime; right foot x-ray was negative for osteomyelitis. She admits to intermittent pain, improved since hospitalization. She was discharged on doxycycline which she should complete with the next 48 hours. 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 She has a remote history of following with vascular medicine for venous reflux, she does not tolerate wearing compression stockings d/t skin sensitivities and "burning" sensation. In office ABI are elevated but she has strongly palpable pulses. We will initiate hydrofera blue and ace wrap compression. She is a  diabetic with A1c 6.9 while hospitalized. 05/19/18- She is seen in follow up evaluation for RLE wounds; the lateral area is healed. She did tolerate ace wrap for compression, but experienced increased xerosis. We will hold off on ace wrap and increase lachydrin to twice daily. She c/o pain to the medial cluster of wounds, there is no apparent infection and will initiate steroid cream. She will follow up next week 05/26/18 on evaluation today patient appears to actually be having some issues currently with pain. She has been utilizing the dressings as previously recommended for the lower extremities. With that being said she does appear to have a little bit of a rash she states that anytime it he's in for anything else touches her skin that she will often develop a rash. She's not even sure that should be able to tolerate a compression wrap due to the fact that again she would be concerned about even the cotton layer being in contact with her skin which overtime will cause a burning sensation. She has seen dermatology concerning this unfortunately they really did not have a good answer for her as to why this is occurring. Fortunately there is no evidence of infection at this time. 05/29/18 on evaluation today patient was actually supposed to be back in the office in order to have a dressings/wrap change is a nurse visit. With that being said she was having significant discomfort with want to touch around the wound and. It green discharge. Subsequently the concern was that she had an infection so we did convert her to a physician visit instead of just a nurse visit. Subsequently upon evaluation she does seem to have purulent discharge along with evidence of cellulitis extending up the leg which is definitely worse than when I saw her earlier in the week. Fortunately there does not appear to be any evidence of systemic infection at this point. No fevers, chills, nausea, or vomiting noted at this  time. 06/02/18 on evaluation today the patient appears to be doing better in regard to the overall appearance in regard to the wound bed. She still does have slough covering the surface  of the wounds and due to pain really I do not think sharp debridement is going to be advisable at this point. Nonetheless I do feel like that she is definitely showing signs of improvement compared to her previous evaluation. Her pain level is still rather high she tells me at times 8-9/10. Unfortunately she is having a difficult time getting into pain management she states that the one that we referred her to stated it would be at least two weeks before they can get her in. 06/12/18 on evaluation today patient actually appears to be doing much better in regard to her lower extremity wounds. In fact there does not appear to be any evidence of infection at this time I do believe the Cipro has been effective her pain is also greatly improved compared to previous. Nonetheless she does have some Slough noted that is gonna require debridement. Patient's MRI was reviewed and revealed that she had no evidence of osteomyelitis, septic arthritis, or absence. She did have blisters noted on the surface of the foot which again visually we see today as well. [name], [name] ([id number]) 06/16/18 on evaluation today patient actually appears to be doing fairly well in regard to her lower extremity ulcerations. With that being said she does have issues at this point with discomfort although she did see the pain management physician this morning he did give her medication to help in this regard. She tells me that after I debrided her wound last week that she actually had significant pain to the point that she ended up having a difficult time even driving to get home. Obviously when she left the clinic she was not in that much pain this somewhat surprises me especially since we put lidocaine on her wounds prior to applying the dressings  and the dressing we were using was Prisma which is not notorious for causing discomfort and burning. Nonetheless she did obviously experience this and she tells me that she set intermittent spells where this occurred in the week since I last saw her. Nonetheless she has been given the pain medications necessary in order to keep things under control at this point. Obviously this is short term but nonetheless I think will be helpful in getting the wounds to heal so that we can appropriately debrided do what we need to do. 06/23/18 on evaluation today patient actually appears to be doing significantly better in regard to her right lower extremity ulcerations. In fact she has made dramatic improvement pretty much all locations which is great news. Fortunately there's no evidence of infection at this time. Her irritation and inflammation also is better than I do believe that the treatment utilizing the Santyl has been beneficial over the past week. I'm not even sure that we need that any further. 06/30/18 on evaluation today patient actually is doing worse compared to her previous evaluation. Unfortunately she's been having increased drainage, pain, and overall I feel like this is likely due to infection based on the appearance of her wound today. In general the patient seems to have digressed although not really back to where she started although definitely not as well she's been doing in the past couple of weeks. She's not having green drainage like she did previous but she is having a lot of drainage to the point that she's actually dripping from the posterior portion of her leg at this time. 07/07/18 on evaluation today patient actually appears to be doing rather well in regard to her right lower extremity  ulcers. They also do measuring smaller which is good news she has a lot of dry and cracked skin which is gonna make her more prone to reinfection we need to work on this. Nonetheless I do believe that  in general the patient has been doing well overall with her current dressing changes and I do believe the doxycycline was beneficial she did have a Staphylococcus aureus infection fortunately this was not MRSA. The doxycycline also is appropriate for this infection. 07/21/18 on evaluation today patient's wound bed actually shows evidence of good improvement. This is really in regard to all locations. There was some dried dressing/eschar covering several of the areas but fortunately she's not having the amount of pain that she previously was experiencing. No fevers, chills, nausea, or vomiting noted at this time. 08/04/18 on evaluation today patient appears to be doing very well in regard to her right lower extremity ulcers. She's been tolerating the dressing changes without complication. Fortunately there does not appear to be any evidence of infection at this time which is excellent news. Overall very pleased with the progress that has been made. The patient is having much less pain she's even been able to cut back a little bit of the pain medication she was taking roughly 3 a day on a regular basis she struck back to two and still seems to be doing well. Overall that is great news. 08/18/18 on evaluation today patient's lower extremity wounds actually both appear to be doing excellent at this time. She has been tolerating the dressing changes without complication. Overall I've been extremely pleased with the progress that she's made. The patient likewise is having much less pain and is very happy as well. 09/08/18 on evaluation today patient actually appears to be doing excellent in regard to her right lower extremity ulcers. She has been tolerating the dressing changes without complication which is great news. Overall I've been extremely pleased with the progress that she has made. With that being said she does still continue to have some issue and it comes to getting this last small area to heal  we've had some trouble in this regard. Nonetheless I do believe that she will do very well following a good debridement today. Patient History Information obtained from Patient. Family History Cancer - Siblings, Diabetes - Mother,Father,Siblings, Heart Disease - Father, Hypertension - Father, Stroke - Father,Mother, No family history of Kidney Disease, Lung Disease, Seizures, Thyroid Problems, Tuberculosis. Social History Former smoker - 10-15 years quit, Marital Status - Married, Alcohol Use - Never, Drug Use - No History, Caffeine Use - Daily. Medical And Surgical History Notes LINLEE, CROMIE ([id number]) Constitutional Symptoms (General Health) vitamin d deficiency Respiratory nodule of lung Cardiovascular dyspnea 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, AVV Musculoskeletal arthritis Review of Systems (ROS) Constitutional Symptoms (General Health) Denies complaints or symptoms of Fever, Chills. Respiratory The patient has no complaints or symptoms. Cardiovascular The patient has no complaints or symptoms. Psychiatric The patient has no complaints or symptoms. Objective Constitutional Well-nourished and well-hydrated in no acute distress. Vitals Time Taken: 10:34 AM, Height: 70 in, Weight: 193 lbs, BMI: 27.7, Temperature: 98.7 F, Pulse: 68 bpm, Respiratory Rate: 16 breaths/min, Blood Pressure: 135/64 mmHg. Respiratory normal breathing without difficulty. Psychiatric this patient is able to make decisions and demonstrates good insight into disease process. Alert and Oriented x 3. pleasant and cooperative. General Notes: Patient's wound bed currently did show some Slough noted on the surface of the wound which require sharp debridement.  This was performed today without complication post debridement the wound bed appears to be doing significantly better. Integumentary (Hair, Skin) Wound #3 status is Healed - Epithelialized. Original cause of wound was Gradually Appeared.  The wound is located on the Right,Posterior Lower Leg. The wound measures 0cm length x 0cm width x 0cm depth; 0cm^2 area and 0cm^3 volume. The wound is limited to skin breakdown. There is a none present amount of drainage noted. The wound margin is flat and intact. There is no granulation within the wound bed. There is a large (67-100%) amount of necrotic tissue within the wound bed including Eschar. The periwound skin appearance did not exhibit: Callus, Crepitus, Excoriation, Induration, Rash, Scarring, Dry/Scaly, Maceration, Atrophie Blanche, Cyanosis, Ecchymosis, Hemosiderin Staining, Mottled, Pallor, Rubor, Erythema. Wound #5 status is Healed - Epithelialized. Original cause of wound was Gradually Appeared. The wound is located on the Right,Distal,Lateral Lower Leg. The wound measures 1cm length x 0.9cm width x 0.1cm depth; 0cm^2 area and 0cm^3 [name], [name] ([id number]) volume. 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 There was a Excisional Skin/Subcutaneous Tissue Debridement with a total area of 0.9 sq cm performed by STONE III, HOYT E., PA-C. With the following instrument(s): Curette to remove Viable and Non-Viable tissue/material. Material removed includes Subcutaneous Tissue and Slough and after achieving pain control using Lidocaine 4% Topical Solution. No specimens were taken. A time out was conducted at 10:49, prior to the start of the procedure. A Minimum amount of bleeding was controlled with Pressure. The procedure was tolerated well with a pain level of 0 throughout and a pain level  of 0 following the procedure. Post Debridement Measurements: 1cm length x 0.9cm width x 0.2cm depth; 0.141cm^3 volume. Character of Wound/Ulcer Post Debridement is improved. Severity of Tissue Post Debridement is: Fat layer exposed. Post procedure Diagnosis Wound #5: Same as Pre-Procedure Plan Wound Cleansing: Wound #5 Right,Distal,Lateral Lower Leg: May Shower, gently pat wound dry prior to applying new dressing. Skin Barriers/Peri-Wound Care: Wound #5 Right,Distal,Lateral Lower Leg: Moisturizing lotion Primary Wound Dressing: Wound #5 Right,Distal,Lateral Lower Leg: Silver Collagen Secondary Dressing: Wound #5 Right,Distal,Lateral Lower Leg: ABD and Kerlix/Conform Dressing Change Frequency: Wound #5 Right,Distal,Lateral Lower Leg: Change dressing every other day. Follow-up Appointments: Wound #5 Right,Distal,Lateral Lower Leg: [name], [name] ([id number]) Return Appointment in 2 weeks. Edema Control: Wound #5 Right,Distal,Lateral Lower Leg: Elevate legs to the level of the heart and pump ankles as often as possible I'm gonna suggest that we continue with the above wound care measures and the patient is in agreement with this plan. Following the sharp debridement we did today I think she will have a significant improvement overall in her symptoms. Please see above for specific wound care orders. We will see patient for re-evaluation in 1 week(s) here in the clinic. If anything worsens or changes patient will contact our office for additional recommendations. Electronic Signature(s) Signed: 09/10/2018 1:15:36 AM By: Lenda Kelp PA-C Entered By: Lenda Kelp on 09/09/2018 21:17:50 Ladd, [name] ([id number]) -------------------------------------------------------------------------------- ROS/PFSH Details Patient Name: Marcia [name] Date of Service: 09/08/2018 10:15 AM Medical Record Number: [id number] Patient Account Number: [id number] Date of Birth/Sex: 1946-04-02 (72 y.o.  F) Treating RN: Curtis Sites Primary Care Provider: Sherrie Mustache Other Clinician: Referring Provider: Sherrie Mustache Treating Provider/Extender: Linwood Dibbles, HOYT Weeks in Treatment: 17 Information Obtained From Patient Wound History Do you currently have one or more open woundso Yes How many open wounds do you currently haveo 5 Approximately how long have you had your woundso 6 How have you been treating your wound(s) until nowo abx Has your wound(s) ever healed and then re-openedo No Have you had any lab work done in the past montho Yes Who ordered the lab work doneo hospital Have you tested positive for an antibiotic resistant organism (MRSA, VRE)o No Have you tested positive for osteomyelitis (bone infection)o No Have you had any tests for circulation on your legso Yes Constitutional Symptoms (General Health) Complaints and Symptoms: Negative for: Fever; Chills Medical History: Past Medical History Notes: vitamin d deficiency Eyes Medical History: Negative for: Cataracts; Glaucoma; Optic Neuritis Ear/Nose/Mouth/Throat Medical History: Negative for: Chronic sinus problems/congestion; Middle ear problems Hematologic/Lymphatic Medical History: Negative for: Anemia; Hemophilia; Human Immunodeficiency Virus; Lymphedema; Sickle Cell Disease Respiratory Complaints and Symptoms: No Complaints or Symptoms Medical History: Negative for: Aspiration; Asthma; Chronic Obstructive Pulmonary Disease (COPD); Pneumothorax; Sleep Apnea; Tuberculosis Past Medical History Notes: nodule of lung [name], Samyukta A. ([id number]) Cardiovascular Complaints and Symptoms: No Complaints or Symptoms Medical History: Positive for: Hypertension Negative for: Angina; Arrhythmia; Congestive Heart Failure; Coronary Artery Disease; Deep Vein Thrombosis; Hypotension; Myocardial Infarction; Peripheral Arterial Disease; Peripheral Venous Disease; Phlebitis; Vasculitis Past Medical History  Notes: dyspnea 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, AVV Gastrointestinal Medical History: Negative for: Cirrhosis ; Colitis; Crohnos; Hepatitis A; Hepatitis B; Hepatitis C Endocrine Medical History: Positive for: Type II Diabetes Time with diabetes: 10 years Treated with: Oral agents Blood sugar tested every day: Yes Tested : every mornimg Genitourinary Medical History: Negative for: End Stage Renal Disease Immunological Medical History: Negative for: Lupus Erythematosus; Raynaudos; Scleroderma Integumentary (Skin) Medical History: Negative for: History of Burn; History of pressure wounds Musculoskeletal Medical History: Negative for: Gout; Rheumatoid Arthritis; Osteoarthritis; Osteomyelitis Past Medical History Notes: arthritis Neurologic Medical History: Positive for: Neuropathy - peripheral Negative for: Dementia; Quadriplegia; Paraplegia; Seizure Disorder Oncologic CAPRISHA, BRIDGETT ([id number]) Medical History: Negative for: Received Chemotherapy; Received Radiation Psychiatric Complaints and Symptoms: No Complaints or Symptoms Medical History: Negative for: Anorexia/bulimia; Confinement Anxiety Immunizations Pneumococcal Vaccine: Received Pneumococcal Vaccination: Yes Tetanus Vaccine: Last tetanus shot: 05/12/2014 Immunization Notes: plan on flu shot Implantable Devices Family and Social History Cancer: Yes - Siblings; Diabetes: Yes - Mother,Father,Siblings; Heart Disease: Yes - Father; Hypertension: Yes - Father; Kidney Disease: No; Lung Disease: No; Seizures: No; Stroke: Yes - Father,Mother; Thyroid Problems: No; Tuberculosis: No; Former smoker - 10-15 years quit; Marital Status - Married; Alcohol Use: Never; Drug Use: No History; Caffeine Use: Daily; Financial Concerns: No; Food, Clothing or Shelter Needs: No; Support System Lacking: No; Transportation Concerns: No; Advanced Directives: No; Patient does not want information on Advanced Directives;  Living Will: No; Medical Power of Attorney: No Physician Affirmation I have reviewed and agree with the above information. Electronic Signature(s) Signed: 09/10/2018 1:15:36 AM By: Lenda Kelp PA-C Signed: 09/10/2018 5:28:33 PM By: Curtis Sites Entered By: Lenda Kelp on 09/09/2018 21:17:24 Jablonsky, [name] ([id number]) -------------------------------------------------------------------------------- SuperBill Details Patient Name: Marcia [name] Date of Service: 09/08/2018 Medical Record Number: [id number] Patient Account Number: [id number] Date of Birth/Sex: 02-Jul-1946 (72 y.o. 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 L97.212 Non-pressure chronic ulcer of right calf with fat layer exposed L97.312 Non-pressure chronic ulcer of right ankle with fat layer exposed E11.622 Type 2 diabetes mellitus with other skin ulcer Facility Procedures CPT4 Code: 56213086 Description: 11042 - DEB SUBQ TISSUE 20 SQ CM/< ICD-10 Diagnosis Description L97.212 Non-pressure chronic ulcer of right calf with fat layer exp Modifier: osed Quantity: 1 Physician Procedures CPT4 Code: [id number] Description: 11042 - WC PHYS SUBQ TISS 20 SQ CM ICD-10 Diagnosis Description L97.212 Non-pressure chronic ulcer of right calf with fat layer exp Modifier: osed Quantity: 1 Electronic Signature(s) Signed: 09/10/2018 1:15:36 AM By: Lenda Kelp PA-C Entered By: Lenda Kelp on 09/09/2018 21:18:14

## 2018-09-22 ENCOUNTER — Encounter: Payer: Medicare HMO | Admitting: Physician Assistant

## 2018-09-22 DIAGNOSIS — E11622 Type 2 diabetes mellitus with other skin ulcer: Secondary | ICD-10-CM | POA: Diagnosis not present

## 2018-09-23 NOTE — Progress Notes (Signed)
[name], [name] ([id number]) Visit Report for 09/22/2018 Chief Complaint Document Details Patient Name: [name], [name] Date of Service: 09/22/2018 3:45 PM Medical Record Number: [id number] Patient Account Number: [id number] Date of Birth/Sex: 07-14-1946 (72 y.o. F) Treating RN: Curtis Sites Primary Care Provider: Sherrie Mustache Other Clinician: Referring Provider: Sherrie Mustache Treating Provider/Extender: Skeet Simmer in Treatment: 19 Information Obtained from: Patient Chief Complaint RLE wounds Electronic Signature(s) Signed: 09/22/2018 6:07:41 PM By: Lenda Kelp PA-C Entered By: Lenda Kelp on 09/22/2018 15:56:25 Ha, [name] ([id number]) -------------------------------------------------------------------------------- HPI Details Patient Name: [name] Date of Service: 09/22/2018 3:45 PM Medical Record Number: [id number] Patient Account Number: [id number] Date of Birth/Sex: 1946-08-24 (72 y.o. F) Treating RN: Curtis Sites Primary Care Provider: Sherrie Mustache Other Clinician: Referring Provider: Sherrie Mustache Treating Provider/Extender: Linwood Dibbles, Paizlie Klaus Weeks in Treatment: 19 History of Present Illness HPI Description: 05/12/18-She is seen in initial evaluation for multiple open areas to the right lower extremity. She was recently hospitalized (7/30-8/2) with cellulitis of the right leg, failed outpatient therapy with Keflex. While hospitalized she was treated with IV vancomycin and cefepime; right foot x-ray was negative for osteomyelitis. She admits to intermittent pain, improved since hospitalization. She was discharged on doxycycline which she should complete with the next 48 hours. 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 She has a remote history of following with vascular medicine for venous reflux, she does not tolerate wearing compression stockings d/t  skin sensitivities and "burning" sensation. In office ABI are elevated but she has strongly palpable pulses. We will initiate hydrofera blue and ace wrap compression. She is a diabetic with A1c 6.9 while hospitalized. 05/19/18- She is seen in follow up evaluation for RLE wounds; the lateral area is healed. She did tolerate ace wrap for compression, but experienced increased xerosis. We will hold off on ace wrap and increase lachydrin to twice daily. She c/o pain to the medial cluster of wounds, there is no apparent infection and will initiate steroid cream. She will follow up next week 05/26/18 on evaluation today patient appears to actually be having some issues currently with pain. She has been utilizing the dressings as previously recommended for the lower extremities. With that being said she does appear to have a little bit of a rash she states that anytime it he's in for anything else touches her skin that she will often develop a rash. She's not even sure that should be able to tolerate a compression wrap due to the fact that again she would be concerned about even the cotton layer being in contact with her skin which overtime will cause a burning sensation. She has seen dermatology concerning this unfortunately they really did not have a good answer for her as to why this is occurring. Fortunately there is no evidence of infection at this time. 05/29/18 on evaluation today patient was actually supposed to be back in the office in order to have a dressings/wrap change is a nurse visit. With that being said she was having significant discomfort with want to touch around the wound and. It green discharge. Subsequently the concern was that she had an infection so we did convert her to a physician visit instead of just a nurse visit. Subsequently upon evaluation she does seem to have purulent discharge along with evidence of cellulitis extending up the leg which is definitely worse than when I saw  her  earlier in the week. Fortunately there does not appear to be any evidence of systemic infection at this point. No fevers, chills, nausea, or vomiting noted at this time. 06/02/18 on evaluation today the patient appears to be doing better in regard to the overall appearance in regard to the wound bed. She still does have slough covering the surface of the wounds and due to pain really I do not think sharp debridement is going to be advisable at this point. Nonetheless I do feel like that she is definitely showing signs of improvement compared to her previous evaluation. Her pain level is still rather high she tells me at times 8-9/10. Unfortunately she is having a difficult time getting into pain management she states that the one that we referred her to stated it would be at least two weeks before they can get her in. 06/12/18 on evaluation today patient actually appears to be doing much better in regard to her lower extremity wounds. In fact there does not appear to be any evidence of infection at this time I do believe the Cipro has been effective her pain is also greatly improved compared to previous. Nonetheless she does have some Slough noted that is gonna require debridement. Patient's MRI was reviewed and revealed that she had no evidence of osteomyelitis, septic arthritis, or absence. She did have blisters noted on the surface of the foot which again visually we see today as well. 06/16/18 on evaluation today patient actually appears to be doing fairly well in regard to her lower extremity ulcerations. With that being said she does have issues at this point with discomfort although she did see the pain management physician this morning he did give her medication to help in this regard. She tells me that after I debrided her wound last week that she actually had significant pain to the point that she ended up having a difficult time even driving to get home. Obviously when she left the clinic  she was not in that much pain this somewhat surprises me especially since we put lidocaine on her wounds prior to applying the dressings and the dressing we were using was Prisma which is not notorious for causing discomfort and Vanderlinde, Ann A. ([id number]) burning. Nonetheless she did obviously experience this and she tells me that she set intermittent spells where this occurred in the week since I last saw her. Nonetheless she has been given the pain medications necessary in order to keep things under control at this point. Obviously this is short term but nonetheless I think will be helpful in getting the wounds to heal so that we can appropriately debrided do what we need to do. 06/23/18 on evaluation today patient actually appears to be doing significantly better in regard to her right lower extremity ulcerations. In fact she has made dramatic improvement pretty much all locations which is great news. Fortunately there's no evidence of infection at this time. Her irritation and inflammation also is better than I do believe that the treatment utilizing the Santyl has been beneficial over the past week. I'm not even sure that we need that any further. 06/30/18 on evaluation today patient actually is doing worse compared to her previous evaluation. Unfortunately she's been having increased drainage, pain, and overall I feel like this is likely due to infection based on the appearance of her wound today. In general the patient seems to have digressed although not really back to where she started although definitely not as well she's been  doing in the past couple of weeks. She's not having green drainage like she did previous but she is having a lot of drainage to the point that she's actually dripping from the posterior portion of her leg at this time. 07/07/18 on evaluation today patient actually appears to be doing rather well in regard to her right lower extremity ulcers. They also do measuring  smaller which is good news she has a lot of dry and cracked skin which is gonna make her more prone to reinfection we need to work on this. Nonetheless I do believe that in general the patient has been doing well overall with her current dressing changes and I do believe the doxycycline was beneficial she did have a Staphylococcus aureus infection fortunately this was not MRSA. The doxycycline also is appropriate for this infection. 07/21/18 on evaluation today patient's wound bed actually shows evidence of good improvement. This is really in regard to all locations. There was some dried dressing/eschar covering several of the areas but fortunately she's not having the amount of pain that she previously was experiencing. No fevers, chills, nausea, or vomiting noted at this time. 08/04/18 on evaluation today patient appears to be doing very well in regard to her right lower extremity ulcers. She's been tolerating the dressing changes without complication. Fortunately there does not appear to be any evidence of infection at this time which is excellent news. Overall very pleased with the progress that has been made. The patient is having much less pain she's even been able to cut back a little bit of the pain medication she was taking roughly 3 a day on a regular basis she struck back to two and still seems to be doing well. Overall that is great news. 08/18/18 on evaluation today patient's lower extremity wounds actually both appear to be doing excellent at this time. She has been tolerating the dressing changes without complication. Overall I've been extremely pleased with the progress that she's made. The patient likewise is having much less pain and is very happy as well. 09/08/18 on evaluation today patient actually appears to be doing excellent in regard to her right lower extremity ulcers. She has been tolerating the dressing changes without complication which is great news. Overall I've been  extremely pleased with the progress that she has made. With that being said she does still continue to have some issue and it comes to getting this last small area to heal we've had some trouble in this regard. Nonetheless I do believe that she will do very well following a good debridement today. 09/22/18 on evaluation today patient's wound actually appears to be doing well. She does show signs of healing compared to previous. Fortunately there does not appear to be evidence of infection at this time which is good news. No fevers, chills, nausea, or vomiting noted at this time. Electronic Signature(s) Signed: 09/22/2018 6:07:41 PM By: Lenda Kelp PA-C Entered By: Lenda Kelp on 09/22/2018 16:43:18 Virden, [name] ([id number]) -------------------------------------------------------------------------------- Physical Exam Details Patient Name: [name] Date of Service: 09/22/2018 3:45 PM Medical Record Number: [id number] Patient Account Number: [id number] Date of Birth/Sex: October 01, 1946 (72 y.o. F) Treating RN: Curtis Sites Primary Care Provider: Sherrie Mustache Other Clinician: Referring Provider: Sherrie Mustache Treating Provider/Extender: Linwood Dibbles, Mariane Burpee Weeks in Treatment: 19 Constitutional Well-nourished and well-hydrated in no acute distress. Respiratory normal breathing without difficulty. clear to auscultation bilaterally. Cardiovascular regular rate and rhythm with normal S1, S2. Psychiatric this patient is able to  make decisions and demonstrates good insight into disease process. Alert and Oriented x 3. pleasant and cooperative. Notes Upon inspection today patient appears to be doing rather well in regard to her lower extremity ulcer. In fact I think this is almost completely healed. With that being said we're still struggling to get the last of area to close. Electronic Signature(s) Signed: 09/22/2018 6:07:41 PM By: Lenda Kelp PA-C Entered By: Lenda Kelp on 09/22/2018 16:43:47 Tropea, [name] ([id number]) -------------------------------------------------------------------------------- Physician Orders Details Patient Name: [name] Date of Service: 09/22/2018 3:45 PM Medical Record Number: [id number] Patient Account Number: [id number] Date of Birth/Sex: [birth year] (72 y.o. 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 Skin Barriers/Peri-Wound Care Wound #5 Right,Distal,Lateral Lower Leg o Moisturizing lotion Primary Wound Dressing Wound #5 Right,Distal,Lateral Lower Leg o Silver Collagen Secondary Dressing Wound #5 Right,Distal,Lateral Lower Leg o ABD and Kerlix/Conform Dressing Change Frequency Wound #5 Right,Distal,Lateral Lower Leg o Change dressing every other day. Follow-up Appointments Wound #5 Right,Distal,Lateral Lower Leg o Return Appointment in 2 weeks. Edema Control Wound #5 Right,Distal,Lateral Lower Leg o Elevate legs to the level of the heart and pump ankles as often as possible Electronic Signature(s) Signed: 09/22/2018 5:03:08 PM By: Curtis Sites Signed: 09/22/2018 6:07:41 PM By: Lenda Kelp PA-C Entered By: Curtis Sites on 09/22/2018 16:27:48 Zemaitis, [name] ([id number]) Lewelling, [name] ([id number]) -------------------------------------------------------------------------------- Problem List Details Patient Name:  [name] Date of Service: 09/22/2018 3:45 PM Medical Record Number: [id number] Patient Account Number: [id number] Date of Birth/Sex: [birth year] (72 y.o. 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 05/12/2018 No Yes L97.212 Non-pressure chronic ulcer of right calf with fat layer exposed 05/12/2018 No Yes L97.312 Non-pressure chronic ulcer of right ankle with fat layer 05/12/2018 No Yes exposed E11.622 Type 2 diabetes mellitus with other skin ulcer 05/12/2018 No Yes Inactive Problems Resolved Problems Electronic Signature(s) Signed: 09/22/2018 6:07:41 PM By: Lenda Kelp PA-C Entered By: Lenda Kelp on 09/22/2018 15:56:19 Gauer, Marolyn AMarland Kitchen ([id number]) -------------------------------------------------------------------------------- Progress Note Details Patient Name: [name] Date of Service: 09/22/2018 3:45 PM Medical Record Number: [id number] Patient Account Number: [id number] Date of Birth/Sex: 1946-07-14 (72 y.o. F) Treating RN: Curtis Sites Primary Care Provider: Sherrie Mustache Other Clinician: Referring Provider: Sherrie Mustache Treating Provider/Extender: Linwood Dibbles, Annamae Shivley Weeks in Treatment: 19 Subjective Chief Complaint Information obtained from Patient RLE wounds History of Present Illness (HPI) 05/12/18-She is seen in initial evaluation for multiple open areas to the right lower extremity. She was recently hospitalized (7/30-8/2) with cellulitis of the right leg, failed outpatient therapy with Keflex. While hospitalized she was treated with IV vancomycin and cefepime; right foot x-ray was negative for osteomyelitis. She admits to intermittent pain, improved since hospitalization. She was discharged on  doxycycline which she should complete with the next 48 hours. She does have a history  of livedoid vasculitis based on biopsy 07/2015. She has had lower extremity ulcers with prolonged healing times. She has a remote history of following with vascular medicine for venous reflux, she does not tolerate wearing compression stockings d/t skin sensitivities and "burning" sensation. In office ABI are elevated but she has strongly palpable pulses. We will initiate hydrofera blue and ace wrap compression. She is a diabetic with A1c 6.9 while hospitalized. 05/19/18- She is seen in follow up evaluation for RLE wounds; the lateral area is healed. She did tolerate ace wrap for compression, but experienced increased xerosis. We will hold off on ace wrap and increase lachydrin to twice daily. She c/o pain to the medial cluster of wounds, there is no apparent infection and will initiate steroid cream. She will follow up next week 05/26/18 on evaluation today patient appears to actually be having some issues currently with pain. She has been utilizing the dressings as previously recommended for the lower extremities. With that being said she does appear to have a little bit of a rash she states that anytime it he's in for anything else touches her skin that she will often develop a rash. She's not even sure that should be able to tolerate a compression wrap due to the fact that again she would be concerned about even the cotton layer being in contact with her skin which overtime will cause a burning sensation. She has seen dermatology concerning this unfortunately they really did not have a good answer for her as to why this is occurring. Fortunately there is no evidence of infection at this time. 05/29/18 on evaluation today patient was actually supposed to be back in the office in order to have a dressings/wrap change is a nurse visit. With that being said she was having significant discomfort with want to touch  around the wound and. It green discharge. Subsequently the concern was that she had an infection so we did convert her to a physician visit instead of just a nurse visit. Subsequently upon evaluation she does seem to have purulent discharge along with evidence of cellulitis extending up the leg which is definitely worse than when I saw her earlier in the week. Fortunately there does not appear to be any evidence of systemic infection at this point. No fevers, chills, nausea, or vomiting noted at this time. 06/02/18 on evaluation today the patient appears to be doing better in regard to the overall appearance in regard to the wound bed. She still does have slough covering the surface of the wounds and due to pain really I do not think sharp debridement is going to be advisable at this point. Nonetheless I do feel like that she is definitely showing signs of improvement compared to her previous evaluation. Her pain level is still rather high she tells me at times 8-9/10. Unfortunately she is having a difficult time getting into pain management she states that the one that we referred her to stated it would be at least two weeks before they can get her in. 06/12/18 on evaluation today patient actually appears to be doing much better in regard to her lower extremity wounds. In fact there does not appear to be any evidence of infection at this time I do believe the Cipro has been effective her pain is also greatly improved compared to previous. Nonetheless she does have some Slough noted that is gonna require debridement. Patient's MRI was reviewed and revealed that she had no evidence of osteomyelitis, septic arthritis, or absence.  She did have blisters noted on the surface of the foot which again visually we see today as well. MATILDA, FLEIG ([id number]) 06/16/18 on evaluation today patient actually appears to be doing fairly well in regard to her lower extremity ulcerations. With that being said she does  have issues at this point with discomfort although she did see the pain management physician this morning he did give her medication to help in this regard. She tells me that after I debrided her wound last week that she actually had significant pain to the point that she ended up having a difficult time even driving to get home. Obviously when she left the clinic she was not in that much pain this somewhat surprises me especially since we put lidocaine on her wounds prior to applying the dressings and the dressing we were using was Prisma which is not notorious for causing discomfort and burning. Nonetheless she did obviously experience this and she tells me that she set intermittent spells where this occurred in the week since I last saw her. Nonetheless she has been given the pain medications necessary in order to keep things under control at this point. Obviously this is short term but nonetheless I think will be helpful in getting the wounds to heal so that we can appropriately debrided do what we need to do. 06/23/18 on evaluation today patient actually appears to be doing significantly better in regard to her right lower extremity ulcerations. In fact she has made dramatic improvement pretty much all locations which is great news. Fortunately there's no evidence of infection at this time. Her irritation and inflammation also is better than I do believe that the treatment utilizing the Santyl has been beneficial over the past week. I'm not even sure that we need that any further. 06/30/18 on evaluation today patient actually is doing worse compared to her previous evaluation. Unfortunately she's been having increased drainage, pain, and overall I feel like this is likely due to infection based on the appearance of her wound today. In general the patient seems to have digressed although not really back to where she started although definitely not as well she's been doing in the past couple of  weeks. She's not having green drainage like she did previous but she is having a lot of drainage to the point that she's actually dripping from the posterior portion of her leg at this time. 07/07/18 on evaluation today patient actually appears to be doing rather well in regard to her right lower extremity ulcers. They also do measuring smaller which is good news she has a lot of dry and cracked skin which is gonna make her more prone to reinfection we need to work on this. Nonetheless I do believe that in general the patient has been doing well overall with her current dressing changes and I do believe the doxycycline was beneficial she did have a Staphylococcus aureus infection fortunately this was not MRSA. The doxycycline also is appropriate for this infection. 07/21/18 on evaluation today patient's wound bed actually shows evidence of good improvement. This is really in regard to all locations. There was some dried dressing/eschar covering several of the areas but fortunately she's not having the amount of pain that she previously was experiencing. No fevers, chills, nausea, or vomiting noted at this time. 08/04/18 on evaluation today patient appears to be doing very well in regard to her right lower extremity ulcers. She's been tolerating the dressing changes without complication. Fortunately there does not  appear to be any evidence of infection at this time which is excellent news. Overall very pleased with the progress that has been made. The patient is having much less pain she's even been able to cut back a little bit of the pain medication she was taking roughly 3 a day on a regular basis she struck back to two and still seems to be doing well. Overall that is great news. 08/18/18 on evaluation today patient's lower extremity wounds actually both appear to be doing excellent at this time. She has been tolerating the dressing changes without complication. Overall I've been extremely pleased  with the progress that she's made. The patient likewise is having much less pain and is very happy as well. 09/08/18 on evaluation today patient actually appears to be doing excellent in regard to her right lower extremity ulcers. She has been tolerating the dressing changes without complication which is great news. Overall I've been extremely pleased with the progress that she has made. With that being said she does still continue to have some issue and it comes to getting this last small area to heal we've had some trouble in this regard. Nonetheless I do believe that she will do very well following a good debridement today. 09/22/18 on evaluation today patient's wound actually appears to be doing well. She does show signs of healing compared to previous. Fortunately there does not appear to be evidence of infection at this time which is good news. No fevers, chills, nausea, or vomiting noted at this time. Patient History Information obtained from Patient. Family History Cancer - Siblings, Diabetes - Mother,Father,Siblings, Heart Disease - Father, Hypertension - Father, Stroke - Father,Mother, No family history of Kidney Disease, Lung Disease, Seizures, Thyroid Problems, Tuberculosis. GUILIANNA, MCKOY ([id number]) Social History Former smoker - 10-15 years quit, Marital Status - Married, Alcohol Use - Never, Drug Use - No History, Caffeine Use - Daily. 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 Respiratory The patient has no complaints or symptoms. Cardiovascular The patient has no complaints or symptoms. Psychiatric The patient has no complaints or  symptoms. Objective Constitutional Well-nourished and well-hydrated in no acute distress. Vitals Time Taken: 3:45 PM, Height: 70 in, Weight: 193 lbs, BMI: 27.7, Temperature: 97.8 F, Pulse: 74 bpm, Respiratory Rate: 16 breaths/min, Blood Pressure: 131/52 mmHg. Respiratory normal breathing without difficulty. clear to auscultation bilaterally. Cardiovascular regular rate and rhythm with normal S1, S2. Psychiatric this patient is able to make decisions and demonstrates good insight into disease process. Alert and Oriented x 3. pleasant and cooperative. General Notes: Upon inspection today patient appears to be doing rather well in regard to her lower extremity ulcer. In fact I think this is almost completely healed. With that being said we're still struggling to get the last of area to close. Integumentary (Hair, Skin) Wound #5 status is Open. Original cause of wound was Gradually Appeared. The wound is located on the Right,Distal,Lateral Lower Leg. The wound measures 0.7cm length x 0.4cm width x 0.1cm depth; 0.22cm^2 area and 0.022cm^3 volume. There is Fat Layer (Subcutaneous Tissue) Exposed exposed. There is no tunneling or undermining noted. There is a medium amount of serosanguineous drainage noted. The wound margin is flat and intact. There is large (67-100%) red granulation Kennard, Samanvitha A. ([id number]) within the wound  bed. There is a small (1-33%) amount of necrotic tissue within the wound bed including Adherent Slough. 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 Skin Barriers/Peri-Wound Care: Wound #5 Right,Distal,Lateral Lower Leg: Moisturizing lotion Primary Wound  Dressing: Wound #5 Right,Distal,Lateral Lower Leg: Silver Collagen Secondary Dressing: Wound #5 Right,Distal,Lateral Lower Leg: ABD and Kerlix/Conform Dressing Change Frequency: Wound #5 Right,Distal,Lateral Lower Leg: Change dressing every other day. Follow-up Appointments: Wound #5 Right,Distal,Lateral Lower Leg: Return Appointment in 2 weeks. Edema Control: Wound #5 Right,Distal,Lateral Lower Leg: Elevate legs to the level of the heart and pump ankles as often as possible My suggestion at this point is gonna be that we go ahead and continue with the above wound care measures for the next week. Patient is in agreement the plan. Will see were things stand. Please see above for specific wound care orders. We will see patient for re-evaluation in 2 week(s) here in the clinic. If anything worsens or changes patient will contact our office for additional recommendations. Electronic Signature(s) Signed: 09/22/2018 6:07:41 PM By: Lenda Kelp PA-C Entered By: Lenda Kelp on 09/22/2018 16:44:04 Wands, [name] ([id number]) CAIDEN, MONSIVAIS ([id number]) -------------------------------------------------------------------------------- ROS/PFSH Details Patient Name: [name] Date of Service: 09/22/2018 3:45 PM Medical Record Number: [id number] Patient Account Number: [id number] Date of Birth/Sex: [birth year] (72 y.o. F) Treating RN: Curtis Sites Primary Care Provider: Sherrie Mustache Other Clinician: Referring Provider: Sherrie Mustache Treating Provider/Extender: Linwood Dibbles, Soffia Doshier Weeks in Treatment: 19 Information Obtained From Patient Wound History Do you currently have one or more open woundso Yes How many open wounds do you currently haveo 5 Approximately how long have you had your woundso 6 How have you been treating your wound(s) until nowo abx Has your wound(s) ever healed and then re-openedo No Have you had any lab work done in the past montho Yes Who ordered the lab  work doneo hospital Have you tested positive for an antibiotic resistant organism (MRSA, VRE)o No Have you tested positive for osteomyelitis (bone infection)o No Have you had any tests for circulation on your legso Yes Constitutional Symptoms (General Health) Complaints and Symptoms: Negative for: Fever; Chills Medical History: Past Medical History Notes: vitamin d deficiency Eyes Medical History: Negative for: Cataracts; Glaucoma; Optic Neuritis Ear/Nose/Mouth/Throat Medical History: Negative for: Chronic sinus problems/congestion; Middle ear problems Hematologic/Lymphatic Medical History: Negative for: Anemia; Hemophilia; Human Immunodeficiency Virus; Lymphedema; Sickle Cell Disease Respiratory Complaints and Symptoms: No Complaints or Symptoms Medical History: Negative for: Aspiration; Asthma; Chronic Obstructive Pulmonary Disease (COPD); Pneumothorax; Sleep Apnea; Tuberculosis Past Medical History Notes: nodule of lung Abshier, Martavia A. ([id number]) Cardiovascular Complaints and Symptoms: No Complaints or Symptoms Medical History: Positive for: Hypertension Negative for: Angina; Arrhythmia; Congestive Heart Failure; Coronary Artery Disease; Deep Vein Thrombosis; Hypotension; Myocardial Infarction; Peripheral Arterial Disease; Peripheral Venous Disease; Phlebitis; Vasculitis Past Medical History Notes: dyspnea 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, AVV Gastrointestinal Medical History: Negative for: Cirrhosis ; Colitis; Crohnos; Hepatitis A; Hepatitis B; Hepatitis C Endocrine Medical History: Positive for: Type II Diabetes Time with diabetes: 10 years Treated with: Oral agents Blood sugar tested every day: Yes Tested : every mornimg Genitourinary Medical History: Negative for: End Stage Renal Disease Immunological Medical History: Negative for: Lupus Erythematosus; Raynaudos; Scleroderma  Integumentary (Skin) Medical History: Negative for: History of Burn;  History of pressure wounds Musculoskeletal Medical History: Negative for: Gout; Rheumatoid Arthritis; Osteoarthritis; Osteomyelitis Past Medical History Notes: arthritis Neurologic Medical History: Positive for: Neuropathy - peripheral Negative for: Dementia; Quadriplegia; Paraplegia; Seizure Disorder Oncologic JAILEN, LUNG ([id number]) Medical History: Negative for: Received Chemotherapy; Received Radiation Psychiatric Complaints and Symptoms: No Complaints or Symptoms Medical History: Negative for: Anorexia/bulimia; Confinement Anxiety Immunizations Pneumococcal Vaccine: Received Pneumococcal Vaccination: Yes Tetanus Vaccine: Last tetanus shot: 05/12/2014 Immunization Notes: plan on flu shot Implantable Devices Family and Social History Cancer: Yes - Siblings; Diabetes: Yes - Mother,Father,Siblings; Heart Disease: Yes - Father; Hypertension: Yes - Father; Kidney Disease: No; Lung Disease: No; Seizures: No; Stroke: Yes - Father,Mother; Thyroid Problems: No; Tuberculosis: No; Former smoker - 10-15 years quit; Marital Status - Married; Alcohol Use: Never; Drug Use: No History; Caffeine Use: Daily; Financial Concerns: No; Food, Clothing or Shelter Needs: No; Support System Lacking: No; Transportation Concerns: No; Advanced Directives: No; Patient does not want information on Advanced Directives; Living Will: No; Medical Power of Attorney: No Physician Affirmation I have reviewed and agree with the above information. Electronic Signature(s) Signed: 09/22/2018 5:03:08 PM By: Curtis Sites Signed: 09/22/2018 6:07:41 PM By: Lenda Kelp PA-C Entered By: Lenda Kelp on 09/22/2018 16:43:32 Pitcock, [name] ([id number]) -------------------------------------------------------------------------------- SuperBill Details Patient Name: [name] Date of Service: 09/22/2018 Medical Record Number: [id number] Patient Account Number: [id number] Date of Birth/Sex: Jul 19, 1946 (72  y.o. F) Treating RN: Curtis Sites Primary Care Provider: Sherrie Mustache Other Clinician: Referring Provider: Sherrie Mustache Treating Provider/Extender: Linwood Dibbles, Keili Hasten Weeks in Treatment: 19 Diagnosis Coding ICD-10 Codes Code Description 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 L97.212 Non-pressure chronic ulcer of right calf with fat layer exposed L97.312 Non-pressure chronic ulcer of right ankle with fat layer exposed E11.622 Type 2 diabetes mellitus with other skin ulcer Facility Procedures CPT4 Code: 08657846 Description: 99213 - WOUND CARE VISIT-LEV 3 EST PT Modifier: Quantity: 1 Physician Procedures CPT4 Code: 9629528 Description: 99214 - WC PHYS LEVEL 4 - EST PT ICD-10 Diagnosis Description 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

## 2018-10-06 ENCOUNTER — Encounter: Payer: Medicare HMO | Admitting: Family Medicine

## 2018-10-06 DIAGNOSIS — E11622 Type 2 diabetes mellitus with other skin ulcer: Secondary | ICD-10-CM | POA: Diagnosis not present

## 2018-10-08 NOTE — Progress Notes (Signed)
Stevenson ClinchITTS, Leialoha A. ([id number]) Visit Report for 10/06/2018 Chief Complaint Document Details Patient Name: [name], [name] Date of Service: 10/06/2018 10:00 AM Medical Record Number: [id number] Patient Account Number: [id number] Date of Birth/Sex: 04/20/1946 (72 y.o. F) Treating RN: Arnette NorrisBiell, Kristina Primary Care Provider: Sherrie MustacheJADALI, FAYEGH Other Clinician: Referring Provider: Sherrie MustacheJADALI, FAYEGH Treating Provider/Extender: Youlanda RoysKnight, Kason Benak Weeks in Treatment: 21 Information Obtained from: Patient Chief Complaint RLE wounds Electronic Signature(s) Signed: 10/08/2018 12:47:52 AM By: Wilkie AyeKnight, Tayton Decaire FNP-C Entered By: Wilkie AyeKnight, Odessie Polzin on 10/06/2018 11:15:33 Franson, Shawn RouteSARAH A. ([id number]) -------------------------------------------------------------------------------- Debridement Details Patient Name: Marcia BrayPITTS, Gizell A. Date of Service: 10/06/2018 10:00 AM Medical Record Number: [id number] Patient Account Number: [id number] Date of Birth/Sex: 01/22/1946 (72 y.o. F) Treating RN: Arnette NorrisBiell, Kristina Primary Care Provider: Sherrie MustacheJADALI, FAYEGH Other Clinician: Referring Provider: Sherrie MustacheJADALI, FAYEGH Treating Provider/Extender: Youlanda RoysKnight, Avis Tirone Weeks in Treatment: 21 Debridement Performed for Wound #5 Right,Distal,Lateral Lower Leg Assessment: Performed By: Physician Wilkie AyeKnight, Haston Casebolt, FNP Debridement Type: Debridement Severity of Tissue Pre Fat layer exposed Debridement: Level of Consciousness (Pre- Awake and Alert procedure): Pre-procedure Verification/Time Yes - 10:19 Out Taken: Start Time: 10:19 Pain Control: Lidocaine Total Area Debrided (L x W): 0.5 (cm) x 0.3 (cm) = 0.15 (cm) Tissue and other material Viable, Non-Viable, Slough, Subcutaneous, Slough debrided: Level: Skin/Subcutaneous Tissue Debridement Description: Excisional Instrument: Curette Bleeding: Minimum Hemostasis Achieved: Pressure End Time: 10:22 Procedural Pain: 0 Post Procedural Pain: 0 Response to Treatment: Procedure was tolerated  well Level of Consciousness Awake and Alert (Post-procedure): Post Debridement Measurements of Total Wound Length: (cm) 0.5 Width: (cm) 0.3 Depth: (cm) 0.1 Volume: (cm) 0.012 Character of Wound/Ulcer Post Debridement: Improved Severity of Tissue Post Debridement: Fat layer exposed Post Procedure Diagnosis Same as Pre-procedure Electronic Signature(s) Signed: 10/06/2018 4:38:01 PM By: Arnette NorrisBiell, Kristina Signed: 10/08/2018 12:47:52 AM By: Wilkie AyeKnight, Singleton Hickox FNP-C Entered By: Arnette NorrisBiell, Kristina on 10/06/2018 10:21:04 Haigler, Shawn RouteSARAH A. ([id number]) -------------------------------------------------------------------------------- HPI Details Patient Name: Marcia BrayPITTS, Kaylee A. Date of Service: 10/06/2018 10:00 AM Medical Record Number: [id number] Patient Account Number: [id number] Date of Birth/Sex: 06/23/1946 (72 y.o. F) Treating RN: Arnette NorrisBiell, Kristina Primary Care Provider: Sherrie MustacheJADALI, FAYEGH Other Clinician: Referring Provider: Sherrie MustacheJADALI, FAYEGH Treating Provider/Extender: Youlanda RoysKnight, Britteny Fiebelkorn Weeks in Treatment: 21 History of Present Illness HPI Description: 05/12/18-She is seen in initial evaluation for multiple open areas to the right lower extremity. She was recently hospitalized (7/30-8/2) with cellulitis of the right leg, failed outpatient therapy with Keflex. While hospitalized she was treated with IV vancomycin and cefepime; right foot x-ray was negative for osteomyelitis. She admits to intermittent pain, improved since hospitalization. She was discharged on doxycycline which she should complete with the next 48 hours. 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 She has a remote history of following with vascular medicine for venous reflux, she does not tolerate wearing compression stockings d/t skin sensitivities and "burning" sensation. In office ABI are elevated but she has strongly palpable pulses. We will initiate  hydrofera blue and ace wrap compression. She is a diabetic with A1c 6.9 while hospitalized. 05/19/18- She is seen in follow up evaluation for RLE wounds; the lateral area is healed. She did tolerate ace wrap for compression, but experienced increased xerosis. We will hold off on ace wrap and increase lachydrin to twice daily. She c/o pain to the medial cluster of wounds, there is no apparent infection and will initiate steroid cream. She will follow up next week 05/26/18 on evaluation today patient appears to actually be having some issues  currently with pain. She has been utilizing the dressings as previously recommended for the lower extremities. With that being said she does appear to have a little bit of a rash she states that anytime it he's in for anything else touches her skin that she will often develop a rash. She's not even sure that should be able to tolerate a compression wrap due to the fact that again she would be concerned about even the cotton layer being in contact with her skin which overtime will cause a burning sensation. She has seen dermatology concerning this unfortunately they really did not have a good answer for her as to why this is occurring. Fortunately there is no evidence of infection at this time. 05/29/18 on evaluation today patient was actually supposed to be back in the office in order to have a dressings/wrap change is a nurse visit. With that being said she was having significant discomfort with want to touch around the wound and. It green discharge. Subsequently the concern was that she had an infection so we did convert her to a physician visit instead of just a nurse visit. Subsequently upon evaluation she does seem to have purulent discharge along with evidence of cellulitis extending up the leg which is definitely worse than when I saw her earlier in the week. Fortunately there does not appear to be any evidence of systemic infection at this point. No fevers,  chills, nausea, or vomiting noted at this time. 06/02/18 on evaluation today the patient appears to be doing better in regard to the overall appearance in regard to the wound bed. She still does have slough covering the surface of the wounds and due to pain really I do not think sharp debridement is going to be advisable at this point. Nonetheless I do feel like that she is definitely showing signs of improvement compared to her previous evaluation. Her pain level is still rather high she tells me at times 8-9/10. Unfortunately she is having a difficult time getting into pain management she states that the one that we referred her to stated it would be at least two weeks before they can get her in. 06/12/18 on evaluation today patient actually appears to be doing much better in regard to her lower extremity wounds. In fact there does not appear to be any evidence of infection at this time I do believe the Cipro has been effective her pain is also greatly improved compared to previous. Nonetheless she does have some Slough noted that is gonna require debridement. Patient's MRI was reviewed and revealed that she had no evidence of osteomyelitis, septic arthritis, or absence. She did have blisters noted on the surface of the foot which again visually we see today as well. 06/16/18 on evaluation today patient actually appears to be doing fairly well in regard to her lower extremity ulcerations. With that being said she does have issues at this point with discomfort although she did see the pain management physician this morning he did give her medication to help in this regard. She tells me that after I debrided her wound last week that she actually had significant pain to the point that she ended up having a difficult time even driving to get home. Obviously when she left the clinic she was not in that much pain this somewhat surprises me especially since we put lidocaine on her wounds prior to applying  the dressings and the dressing we were using was Prisma which is not notorious for causing  discomfort and Glace, Angelica A. ([id number]) burning. Nonetheless she did obviously experience this and she tells me that she set intermittent spells where this occurred in the week since I last saw her. Nonetheless she has been given the pain medications necessary in order to keep things under control at this point. Obviously this is short term but nonetheless I think will be helpful in getting the wounds to heal so that we can appropriately debrided do what we need to do. 06/23/18 on evaluation today patient actually appears to be doing significantly better in regard to her right lower extremity ulcerations. In fact she has made dramatic improvement pretty much all locations which is great news. Fortunately there's no evidence of infection at this time. Her irritation and inflammation also is better than I do believe that the treatment utilizing the Santyl has been beneficial over the past week. I'm not even sure that we need that any further. 06/30/18 on evaluation today patient actually is doing worse compared to her previous evaluation. Unfortunately she's been having increased drainage, pain, and overall I feel like this is likely due to infection based on the appearance of her wound today. In general the patient seems to have digressed although not really back to where she started although definitely not as well she's been doing in the past couple of weeks. She's not having green drainage like she did previous but she is having a lot of drainage to the point that she's actually dripping from the posterior portion of her leg at this time. 07/07/18 on evaluation today patient actually appears to be doing rather well in regard to her right lower extremity ulcers. They also do measuring smaller which is good news she has a lot of dry and cracked skin which is gonna make her more prone to reinfection we need to  work on this. Nonetheless I do believe that in general the patient has been doing well overall with her current dressing changes and I do believe the doxycycline was beneficial she did have a Staphylococcus aureus infection fortunately this was not MRSA. The doxycycline also is appropriate for this infection. 07/21/18 on evaluation today patient's wound bed actually shows evidence of good improvement. This is really in regard to all locations. There was some dried dressing/eschar covering several of the areas but fortunately she's not having the amount of pain that she previously was experiencing. No fevers, chills, nausea, or vomiting noted at this time. 08/04/18 on evaluation today patient appears to be doing very well in regard to her right lower extremity ulcers. She's been tolerating the dressing changes without complication. Fortunately there does not appear to be any evidence of infection at this time which is excellent news. Overall very pleased with the progress that has been made. The patient is having much less pain she's even been able to cut back a little bit of the pain medication she was taking roughly 3 a day on a regular basis she struck back to two and still seems to be doing well. Overall that is great news. 08/18/18 on evaluation today patient's lower extremity wounds actually both appear to be doing excellent at this time. She has been tolerating the dressing changes without complication. Overall I've been extremely pleased with the progress that she's made. The patient likewise is having much less pain and is very happy as well. 09/08/18 on evaluation today patient actually appears to be doing excellent in regard to her right lower extremity ulcers. She has been tolerating the  dressing changes without complication which is great news. Overall I've been extremely pleased with the progress that she has made. With that being said she does still continue to have some issue and it  comes to getting this last small area to heal we've had some trouble in this regard. Nonetheless I do believe that she will do very well following a good debridement today. 09/22/18 on evaluation today patient's wound actually appears to be doing well. She does show signs of healing compared to previous. Fortunately there does not appear to be evidence of infection at this time which is good news. No fevers, chills, nausea, or vomiting noted at this time. 10/06/18 Seen today for follow up and management of right LE ulcer. Wound progressing well. No s/s of infection. Denies any recent fever, chills, or pain. Electronic Signature(s) Signed: 10/08/2018 12:47:52 AM By: Wilkie Aye FNP-C Entered By: Wilkie Aye on 10/06/2018 12:09:25 Ridings, [name] ([id number]) -------------------------------------------------------------------------------- Physical Exam Details Patient Name: Marcia [name] Date of Service: 10/06/2018 10:00 AM Medical Record Number: [id number] Patient Account Number: [id number] Date of Birth/Sex: 02/20/1946 (72 y.o. F) Treating RN: Arnette Norris Primary Care Provider: Sherrie Mustache Other Clinician: Referring Provider: Sherrie Mustache Treating Provider/Extender: Youlanda Roys in Treatment: 21 Constitutional appears in no distress. Respiratory Respiratory effort is easy and symmetric bilaterally. Rate is normal at rest and on room air.. Cardiovascular Pedal pulses palpable and strong bilaterally.. Integumentary (Hair, Skin) open wound. Notes Wound of lower extremity ulcer is progressing well. Small amount of slough present. Lightly debrided area removing slough and some subcutaneous tissue. Tolerated procedure well. Minimal bleeding. Wound looks better post procedure. Electronic Signature(s) Signed: 10/08/2018 12:47:52 AM By: Wilkie Aye FNP-C Entered By: Wilkie Aye on 10/06/2018 12:14:50 Tippin, [name]  ([id number]) -------------------------------------------------------------------------------- Physician Orders Details Patient Name: Marcia [name] Date of Service: 10/06/2018 10:00 AM Medical Record Number: [id number] Patient Account Number: [id number] Date of Birth/Sex: 12-Oct-1945 (72 y.o. F) Treating RN: Arnette Norris Primary Care Provider: Sherrie Mustache Other Clinician: Referring Provider: Sherrie Mustache Treating Provider/Extender: Youlanda Roys in Treatment: 21 Verbal / Phone Orders: No Diagnosis Coding Wound Cleansing Wound #5 Right,Distal,Lateral Lower Leg o May Shower, gently pat wound dry prior to applying new dressing. Skin Barriers/Peri-Wound Care Wound #5 Right,Distal,Lateral Lower Leg o Moisturizing lotion Primary Wound Dressing Wound #5 Right,Distal,Lateral Lower Leg o Silver Collagen Secondary Dressing Wound #5 Right,Distal,Lateral Lower Leg o ABD and Kerlix/Conform Dressing Change Frequency Wound #5 Right,Distal,Lateral Lower Leg o Change dressing every other day. Follow-up Appointments Wound #5 Right,Distal,Lateral Lower Leg o Return Appointment in 2 weeks. Edema Control Wound #5 Right,Distal,Lateral Lower Leg o Elevate legs to the level of the heart and pump ankles as often as possible Electronic Signature(s) Signed: 10/08/2018 12:47:52 AM By: Wilkie Aye FNP-C Entered By: Wilkie Aye on 10/06/2018 12:15:07 Flitton, [name] ([id number]) -------------------------------------------------------------------------------- Problem List Details Patient Name: Marcia [name] Date of Service: 10/06/2018 10:00 AM Medical Record Number: [id number] Patient Account Number: [id number] Date of Birth/Sex: August 10, 1946 (72 y.o. 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 Date of Service: 10/06/2018 10:00 AM Medical Record Number: [id number] Patient Account Number: [id number] Date of Birth/Sex: 07/10/1946 (72 y.o. F) Treating RN: Arnette Norris Primary Care Provider: Sherrie Mustache Other Clinician: Referring Provider: Sherrie Mustache Treating Provider/Extender: Youlanda Roys in Treatment: 21 Subjective Chief Complaint Information obtained from Patient RLE wounds History of Present Illness (HPI) 05/12/18-She is seen in initial evaluation for multiple open areas to the right lower extremity. She was recently hospitalized (7/30-8/2) with cellulitis of the right leg, failed outpatient therapy with Keflex. While hospitalized she was treated with IV vancomycin and cefepime; right foot x-ray was negative for osteomyelitis. She admits to intermittent pain, improved since hospitalization. She was discharged on doxycycline which she should complete with the next 48 hours. 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 She has a remote history of following with vascular medicine for venous reflux, she does not tolerate wearing compression stockings d/t skin sensitivities and  "burning" sensation. In office ABI are elevated but she has strongly palpable pulses. We will initiate hydrofera blue and ace wrap compression. She is a diabetic with A1c 6.9 while hospitalized. 05/19/18- She is seen in follow up evaluation for RLE wounds; the lateral area is healed. She did tolerate ace wrap for compression, but experienced increased xerosis. We will hold off on ace wrap and increase lachydrin to twice daily. She c/o pain to the medial cluster of wounds, there is no apparent infection and will initiate steroid cream. She will follow up next week 05/26/18 on evaluation today patient appears to actually be having some issues currently with pain. She has been utilizing the dressings as previously recommended for the lower extremities. With that being said she does appear to have a little bit of a rash she states that anytime it he's in for anything else touches her skin that she will often develop a rash. She's not even sure that should be able to tolerate a compression wrap due to the fact that again she would be concerned about even the cotton layer being in contact with her skin which overtime will cause a burning sensation. She has seen dermatology concerning this unfortunately they really did not have a good answer for her as to why this is occurring. Fortunately there is no evidence of infection at this time. 05/29/18 on evaluation today patient was actually supposed to be back in the office in order to have a dressings/wrap change is a nurse visit. With that being said she was having significant discomfort with want to touch around the wound and. It green discharge. Subsequently the concern was that she had an infection so we did convert her to a physician visit instead of just a nurse visit. Subsequently upon evaluation she does seem to have purulent discharge along with evidence of cellulitis extending up the leg which is definitely worse than when I saw her earlier in the week.  Fortunately there does not appear to be any evidence of systemic infection at this point. No fevers, chills, nausea, or vomiting noted at this time. 06/02/18 on evaluation today the patient appears to be doing better in regard to the overall appearance in regard to the wound bed. She still does have slough covering the surface of the wounds  and due to pain really I do not think sharp debridement is going to be advisable at this point. Nonetheless I do feel like that she is definitely showing signs of improvement compared to her previous evaluation. Her pain level is still rather high she tells me at times 8-9/10. Unfortunately she is having a difficult time getting into pain management she states that the one that we referred her to stated it would be at least two weeks before they can get her in. 06/12/18 on evaluation today patient actually appears to be doing much better in regard to her lower extremity wounds. In fact there does not appear to be any evidence of infection at this time I do believe the Cipro has been effective her pain is also greatly improved compared to previous. Nonetheless she does have some Slough noted that is gonna require debridement. Patient's MRI was reviewed and revealed that she had no evidence of osteomyelitis, septic arthritis, or absence. She did have blisters noted on the surface of the foot which again visually we see today as well. BRYTNEE, BECHLER ([id number]) 06/16/18 on evaluation today patient actually appears to be doing fairly well in regard to her lower extremity ulcerations. With that being said she does have issues at this point with discomfort although she did see the pain management physician this morning he did give her medication to help in this regard. She tells me that after I debrided her wound last week that she actually had significant pain to the point that she ended up having a difficult time even driving to get home. Obviously when she left the  clinic she was not in that much pain this somewhat surprises me especially since we put lidocaine on her wounds prior to applying the dressings and the dressing we were using was Prisma which is not notorious for causing discomfort and burning. Nonetheless she did obviously experience this and she tells me that she set intermittent spells where this occurred in the week since I last saw her. Nonetheless she has been given the pain medications necessary in order to keep things under control at this point. Obviously this is short term but nonetheless I think will be helpful in getting the wounds to heal so that we can appropriately debrided do what we need to do. 06/23/18 on evaluation today patient actually appears to be doing significantly better in regard to her right lower extremity ulcerations. In fact she has made dramatic improvement pretty much all locations which is great news. Fortunately there's no evidence of infection at this time. Her irritation and inflammation also is better than I do believe that the treatment utilizing the Santyl has been beneficial over the past week. I'm not even sure that we need that any further. 06/30/18 on evaluation today patient actually is doing worse compared to her previous evaluation. Unfortunately she's been having increased drainage, pain, and overall I feel like this is likely due to infection based on the appearance of her wound today. In general the patient seems to have digressed although not really back to where she started although definitely not as well she's been doing in the past couple of weeks. She's not having green drainage like she did previous but she is having a lot of drainage to the point that she's actually dripping from the posterior portion of her leg at this time. 07/07/18 on evaluation today patient actually appears to be doing rather well in regard to her right lower extremity ulcers. They also  do measuring smaller which is good news  she has a lot of dry and cracked skin which is gonna make her more prone to reinfection we need to work on this. Nonetheless I do believe that in general the patient has been doing well overall with her current dressing changes and I do believe the doxycycline was beneficial she did have a Staphylococcus aureus infection fortunately this was not MRSA. The doxycycline also is appropriate for this infection. 07/21/18 on evaluation today patient's wound bed actually shows evidence of good improvement. This is really in regard to all locations. There was some dried dressing/eschar covering several of the areas but fortunately she's not having the amount of pain that she previously was experiencing. No fevers, chills, nausea, or vomiting noted at this time. 08/04/18 on evaluation today patient appears to be doing very well in regard to her right lower extremity ulcers. She's been tolerating the dressing changes without complication. Fortunately there does not appear to be any evidence of infection at this time which is excellent news. Overall very pleased with the progress that has been made. The patient is having much less pain she's even been able to cut back a little bit of the pain medication she was taking roughly 3 a day on a regular basis she struck back to two and still seems to be doing well. Overall that is great news. 08/18/18 on evaluation today patient's lower extremity wounds actually both appear to be doing excellent at this time. She has been tolerating the dressing changes without complication. Overall I've been extremely pleased with the progress that she's made. The patient likewise is having much less pain and is very happy as well. 09/08/18 on evaluation today patient actually appears to be doing excellent in regard to her right lower extremity ulcers. She has been tolerating the dressing changes without complication which is great news. Overall I've been extremely pleased with the  progress that she has made. With that being said she does still continue to have some issue and it comes to getting this last small area to heal we've had some trouble in this regard. Nonetheless I do believe that she will do very well following a good debridement today. 09/22/18 on evaluation today patient's wound actually appears to be doing well. She does show signs of healing compared to previous. Fortunately there does not appear to be evidence of infection at this time which is good news. No fevers, chills, nausea, or vomiting noted at this time. 10/06/18 Seen today for follow up and management of right LE ulcer. Wound progressing well. No s/s of infection. Denies any recent fever, chills, or pain. Patient History Information obtained from Patient. Family History OLIVIAROSE, PUNCH ([id number]) Cancer - Siblings, Diabetes - Mother,Father,Siblings, Heart Disease - Father, Hypertension - Father, Stroke - Father,Mother, No family history of Kidney Disease, Lung Disease, Seizures, Thyroid Problems, Tuberculosis. Social History Former smoker - 10-15 years quit, Marital Status - Married, Alcohol Use - Never, Drug Use - No History, Caffeine Use - Daily. 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 Respiratory The patient has no complaints or symptoms. Cardiovascular The patient has no complaints or symptoms. Integumentary (Skin) Complains or has symptoms of Wounds - right LE. Objective Constitutional appears in no distress. Vitals Time Taken: 10:11 AM,  Height: 70 in, Weight: 193 lbs, BMI: 27.7, Temperature: 98.1 F, Pulse: 69 bpm, Respiratory Rate: 18 breaths/min, Blood Pressure: 140/58  mmHg. Respiratory Respiratory effort is easy and symmetric bilaterally. Rate is normal at rest and on room air.. Cardiovascular Pedal pulses palpable and strong bilaterally.. General Notes: Wound of lower extremity ulcer is progressing well. Small amount of slough present. Lightly debrided area removing slough and some subcutaneous tissue. Tolerated procedure well. Minimal bleeding. Wound looks better post procedure. Integumentary (Hair, Skin) open wound. Wound #5 status is Open. Original cause of wound was Gradually Appeared. The wound is located on the Right,Distal,Lateral Hamme, LURA A. ([id number]) Lower Leg. The wound measures 0.5cm length x 0.3cm width x 0.1cm depth; 0.118cm^2 area and 0.012cm^3 volume. 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 There was a Excisional Skin/Subcutaneous Tissue Debridement with a total area of 0.15 sq cm performed by Wilkie Aye, FNP. With the following instrument(s): Curette to remove Viable and Non-Viable tissue/material. Material removed includes Subcutaneous Tissue and Slough and after achieving pain control using Lidocaine. A time out was conducted at 10:19, prior to the start of the procedure. A Minimum amount of bleeding was controlled with Pressure. The procedure was tolerated well with a pain level of 0 throughout and a pain level of 0 following the procedure. Post Debridement Measurements: 0.5cm length x 0.3cm width x 0.1cm depth; 0.012cm^3 volume. Character of Wound/Ulcer Post Debridement is improved. Severity of Tissue Post Debridement is: Fat  layer exposed. Post procedure Diagnosis Wound #5: Same as Pre-Procedure Plan Wound Cleansing: Wound #5 Right,Distal,Lateral Lower Leg: May Shower, gently pat wound dry prior to applying new dressing. Skin Barriers/Peri-Wound Care: Wound #5 Right,Distal,Lateral Lower Leg: Moisturizing lotion Primary Wound Dressing: Wound #5 Right,Distal,Lateral Lower Leg: Silver Collagen Secondary Dressing: Wound #5 Right,Distal,Lateral Lower Leg: ABD and Kerlix/Conform Dressing Change Frequency: Wound #5 Right,Distal,Lateral Lower Leg: Change dressing every other day. Follow-up Appointments: Wound #5 Right,Distal,Lateral Lower Leg: Shoberg, Toleen A. ([id number]) Return Appointment in 2 weeks. Edema Control: Wound #5 Right,Distal,Lateral Lower Leg: Elevate legs to the level of the heart and pump ankles as often as possible Electronic Signature(s) Signed: 10/08/2018 12:47:52 AM By: Wilkie Aye FNP-C Entered By: Wilkie Aye on 10/06/2018 12:15:20 Belue, [name] ([id number]) -------------------------------------------------------------------------------- ROS/PFSH Details Patient Name: Marcia [name] Date of Service: 10/06/2018 10:00 AM Medical Record Number: [id number] Patient Account Number: [id number] Date of Birth/Sex: 1945-11-03 (72 y.o. F) Treating RN: Arnette Norris Primary Care Provider: Sherrie Mustache Other Clinician: Referring Provider: Sherrie Mustache Treating Provider/Extender: Youlanda Roys in Treatment: 21 Information Obtained From Patient Wound History Do you currently have one or more open woundso Yes How many open wounds do you currently haveo 5 Approximately how long have you had your woundso 6 How have you been treating your wound(s) until nowo abx Has your wound(s) ever healed and then re-openedo No Have you had any lab work done in the past montho Yes Who ordered the lab work doneo hospital Have you tested positive for an antibiotic resistant organism (MRSA,  VRE)o No Have you tested positive for osteomyelitis (bone infection)o No Have you had any tests for circulation on your legso Yes Integumentary (Skin) Complaints  and Symptoms: Positive for: Wounds - right LE Medical History: Negative for: History of Burn; History of pressure wounds Constitutional Symptoms (General Health) Complaints and Symptoms: No Complaints or Symptoms Medical History: Past Medical History Notes: vitamin d deficiency Eyes Medical History: Negative for: Cataracts; Glaucoma; Optic Neuritis Ear/Nose/Mouth/Throat Medical History: Negative for: Chronic sinus problems/congestion; Middle ear problems Hematologic/Lymphatic Medical History: Negative for: Anemia; Hemophilia; Human Immunodeficiency Virus; Lymphedema; Sickle Cell Disease Respiratory Smithhart, Brantlee A. ([id number]) Complaints and Symptoms: No Complaints or Symptoms Medical History: Negative for: Aspiration; Asthma; Chronic Obstructive Pulmonary Disease (COPD); Pneumothorax; Sleep Apnea; Tuberculosis Past Medical History Notes: nodule of lung Cardiovascular Complaints and Symptoms: No Complaints or Symptoms Medical History: Positive for: Hypertension Negative for: Angina; Arrhythmia; Congestive Heart Failure; Coronary Artery Disease; Deep Vein Thrombosis; Hypotension; Myocardial Infarction; Peripheral Arterial Disease; Peripheral Venous Disease; Phlebitis; Vasculitis Past Medical History Notes: dyspnea 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, AVV Gastrointestinal Medical History: Negative for: Cirrhosis ; Colitis; Crohnos; Hepatitis A; Hepatitis B; Hepatitis C Endocrine Medical History: Positive for: Type II Diabetes Time with diabetes: 10 years Treated with: Oral agents Blood sugar tested every day: Yes Tested : every mornimg Genitourinary Medical History: Negative for: End Stage Renal Disease Immunological Medical History: Negative for: Lupus Erythematosus; Raynaudos;  Scleroderma Musculoskeletal Medical History: Negative for: Gout; Rheumatoid Arthritis; Osteoarthritis; Osteomyelitis Past Medical History Notes: arthritis Neurologic Medical History: Positive for: Neuropathy - peripheral Negative for: Dementia; Quadriplegia; Paraplegia; Seizure Disorder SHANEEN, REESER ([id number]) Oncologic Medical History: Negative for: Received Chemotherapy; Received Radiation Psychiatric Medical History: Negative for: Anorexia/bulimia; Confinement Anxiety Immunizations Pneumococcal Vaccine: Received Pneumococcal Vaccination: Yes Tetanus Vaccine: Last tetanus shot: 05/12/2014 Immunization Notes: plan on flu shot Implantable Devices Family and Social History Cancer: Yes - Siblings; Diabetes: Yes - Mother,Father,Siblings; Heart Disease: Yes - Father; Hypertension: Yes - Father; Kidney Disease: No; Lung Disease: No; Seizures: No; Stroke: Yes - Father,Mother; Thyroid Problems: No; Tuberculosis: No; Former smoker - 10-15 years quit; Marital Status - Married; Alcohol Use: Never; Drug Use: No History; Caffeine Use: Daily; Financial Concerns: No; Food, Clothing or Shelter Needs: No; Support System Lacking: No; Transportation Concerns: No; Advanced Directives: No; Patient does not want information on Advanced Directives; Living Will: No; Medical Power of Attorney: No Physician Affirmation I have reviewed and agree with the above information. Electronic Signature(s) Signed: 10/06/2018 4:38:01 PM By: Arnette Norris Signed: 10/08/2018 12:47:52 AM By: Wilkie Aye FNP-C Entered By: Wilkie Aye on 10/06/2018 12:10:58 Jenniges, [name] ([id number]) -------------------------------------------------------------------------------- SuperBill Details Patient Name: Marcia [name] Date of Service: 10/06/2018 Medical Record Number: [id number] Patient Account Number: [id number] Date of Birth/Sex: [birth year] (73 y.o. 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 L97.212 Non-pressure chronic ulcer of right calf with fat layer exposed L97.312 Non-pressure chronic ulcer of right ankle with fat layer exposed E11.622 Type 2 diabetes mellitus with other skin ulcer Facility Procedures CPT4 Code: 44010272 Description: 11042 - DEB SUBQ TISSUE 20 SQ CM/< ICD-10 Diagnosis Description L97.212 Non-pressure chronic ulcer of right calf with fat layer exp Modifier: osed Quantity: 1 Physician Procedures CPT4 Code: 5366440 Description: 11042 - WC PHYS SUBQ TISS 20 SQ CM ICD-10 Diagnosis Description L97.212 Non-pressure chronic ulcer of right calf with fat layer exp Modifier: osed Quantity: 1 Electronic Signature(s) Signed: 10/08/2018 12:47:52 AM By: Wilkie Aye FNP-C Entered By: Wilkie Aye on 10/06/2018 12:15:40

## 2018-10-08 NOTE — Progress Notes (Signed)
Marcia, [name] ([id number]) Visit Report for 10/06/2018 Arrival Information Details Patient Name: Marcia, [name] A. Date of Service: 10/06/2018 10:00 AM Medical Record Number: [id number] Patient Account Number: [id number] Date of Birth/Sex: 08/16/1946 (73 y.o. F) Treating RN: Arnette Norris Primary Care Brinlee Gambrell: Sherrie Mustache Other Clinician: Referring Taelyn Nemes: Sherrie Mustache Treating Kyler Lerette/Extender: Youlanda Roys in Treatment: 21 Visit Information History Since Last Visit Added or deleted any medications: No Patient Arrived: Ambulatory Any new allergies or adverse reactions: No Arrival Time: 10:09 Had a fall or experienced change in No Accompanied By: self activities of daily living that may affect Transfer Assistance: None risk of falls: Patient Identification Verified: Yes Signs or symptoms of abuse/neglect since last visito No Secondary Verification Process Completed: Yes Hospitalized since last visit: No Patient Requires Transmission-Based No Has Dressing in Place as Prescribed: Yes Precautions: Pain Present Now: Yes Patient Has Alerts: Yes Patient Alerts: DMII Electronic Signature(s) Signed: 10/06/2018 4:38:01 PM By: Arnette Norris Entered By: Arnette Norris on 10/06/2018 10:11:08 Ryle, [name] ([id number]) -------------------------------------------------------------------------------- Encounter Discharge Information Details Patient Name: [name] A. Date of Service: 10/06/2018 10:00 AM Medical Record Number: [id number] Patient Account Number: [id number] Date of Birth/Sex: 10/23/1945 (73 y.o. F) Treating RN: Arnette Norris Primary Care Janee Ureste: Sherrie Mustache Other Clinician: Referring Alvester Eads: Sherrie Mustache Treating Vernesha Talbot/Extender: Youlanda Roys in Treatment: 21 Encounter Discharge Information Items Post Procedure Vitals Discharge Condition: Stable Temperature (F): 98.1 Ambulatory Status: Ambulatory Pulse (bpm): 69 Discharge  Destination: Home Respiratory Rate (breaths/min): 18 Transportation: Private Auto Blood Pressure (mmHg): 140/58 Accompanied By: self Schedule Follow-up Appointment: Yes Clinical Summary of Care: Electronic Signature(s) Signed: 10/06/2018 4:38:01 PM By: Arnette Norris Entered By: Arnette Norris on 10/06/2018 10:28:32 Carie, [name] ([id number]) -------------------------------------------------------------------------------- Lower Extremity Assessment Details Patient Name: [name] A. Date of Service: 10/06/2018 10:00 AM Medical Record Number: [id number] Patient Account Number: [id number] Date of Birth/Sex: 30-Jun-1946 (73 y.o. F) Treating RN: Arnette Norris Primary Care Kieu Quiggle: Sherrie Mustache Other Clinician: Referring Vickii Volland: Sherrie Mustache Treating Darian Cansler/Extender: Wilkie Aye Weeks in Treatment: 21 Electronic Signature(s) Signed: 10/06/2018 4:38:01 PM By: Arnette Norris Entered By: Arnette Norris on 10/06/2018 10:15:31 Haralson, [name] ([id number]) -------------------------------------------------------------------------------- Multi Wound Chart Details Patient Name: [name] A. Date of Service: 10/06/2018 10:00 AM Medical Record Number: [id number] Patient Account Number: [id number] Date of Birth/Sex: 06-05-1946 (73 y.o. F) Treating RN: Arnette Norris Primary Care Millisa Giarrusso: Sherrie Mustache Other Clinician: Referring Kenasia Scheller: Sherrie Mustache Treating Dianca Owensby/Extender: Youlanda Roys in Treatment: 21 Vital Signs Height(in): 70 Pulse(bpm): 69 Weight(lbs): 193 Blood Pressure(mmHg): 140/58 Body Mass Index(BMI): 28 Temperature(F): 98.1 Respiratory Rate 18 (breaths/min): Photos: [5:No Photos] [N/A:N/A] Wound Location: [5:Right, Distal, Lateral Lower Leg] [N/A:N/A] Wounding Event: [5:Gradually Appeared] [N/A:N/A] Primary Etiology: [5:Diabetic Wound/Ulcer of the Lower Extremity] [N/A:N/A] Date Acquired: [5:06/28/2018] [N/A:N/A] Weeks of Treatment:  [5:14] [N/A:N/A] Wound Status: [5:Open] [N/A:N/A] Clustered Wound: [5:Yes] [N/A:N/A] Measurements L x W x D [5:0.5x0.3x0.1] [N/A:N/A] (cm) Area (cm) : [5:0.118] [N/A:N/A] Volume (cm) : [5:0.012] [N/A:N/A] % Reduction in Area: [5:99.40%] [N/A:N/A] % Reduction in Volume: [5:99.40%] [N/A:N/A] Classification: [5:Grade 1] [N/A:N/A] Debridement: [5:Debridement - Excisional] [N/A:N/A] Pre-procedure [5:10:19] [N/A:N/A] Verification/Time Out Taken: Pain Control: [5:Lidocaine] [N/A:N/A] Tissue Debrided: [5:Subcutaneous, Slough] [N/A:N/A] Level: [5:Skin/Subcutaneous Tissue] [N/A:N/A] Debridement Area (sq cm): [5:0.15] [N/A:N/A] Instrument: [5:Curette] [N/A:N/A] Bleeding: [5:Minimum] [N/A:N/A] Hemostasis Achieved: [5:Pressure] [N/A:N/A] Procedural Pain: [5:0] [N/A:N/A] Post Procedural Pain: [5:0] [N/A:N/A] Debridement Treatment [5:Procedure was tolerated well] [N/A:N/A] Response: Post Debridement [5:0.5x0.3x0.1] [N/A:N/A] Measurements L x W x D (cm) Post Debridement Volume: [5:0.012] [N/A:N/A] (cm) Periwound Skin Texture: [5:No  Abnormalities Noted] [N/A:N/A] Periwound Skin Moisture: No Abnormalities Noted N/A N/A Periwound Skin Color: No Abnormalities Noted N/A N/A Tenderness on Palpation: No N/A N/A Procedures Performed: Debridement N/A N/A Treatment Notes Wound #5 (Right, Distal, Lateral Lower Leg) Notes prisma, conform tape and cotton sleeve Electronic Signature(s) Signed: 10/08/2018 12:47:52 AM By: Wilkie Aye FNP-C Entered By: Wilkie Aye on 10/06/2018 11:13:30 Tschantz, [name] ([id number]) -------------------------------------------------------------------------------- Multi-Disciplinary Care Plan Details Patient Name: [name] A. Date of Service: 10/06/2018 10:00 AM Medical Record Number: [id number] Patient Account Number: [id number] Date of Birth/Sex: 1946/10/02 (73 y.o. F) Treating RN: Arnette Norris Primary Care Brien Lowe: Sherrie Mustache Other  Clinician: Referring Davionne Dowty: Sherrie Mustache Treating Danila Eddie/Extender: Youlanda Roys in Treatment: 21 Active Inactive Abuse / Safety / Falls / Self Care Management Nursing Diagnoses: Potential for falls Goals: Patient will not experience any injury related to falls Date Initiated: 05/12/2018 Target Resolution Date: 08/15/2018 Goal Status: Active Interventions: Assess Activities of Daily Living upon admission and as needed Assess fall risk on admission and as needed Assess: immobility, friction, shearing, incontinence upon admission and as needed Assess impairment of mobility on admission and as needed per policy Assess personal safety and home safety (as indicated) on admission and as needed Notes: Nutrition Nursing Diagnoses: Imbalanced nutrition Impaired glucose control: actual or potential Goals: Patient/caregiver agrees to and verbalizes understanding of need to use nutritional supplements and/or vitamins as prescribed Date Initiated: 05/12/2018 Target Resolution Date: 09/12/2018 Goal Status: Active Patient/caregiver will maintain therapeutic glucose control Date Initiated: 05/12/2018 Target Resolution Date: 08/15/2018 Goal Status: Active Interventions: Assess patient nutrition upon admission and as needed per policy Provide education on elevated blood sugars and impact on wound healing Provide education on nutrition Notes: Orientation to the Wound Care Program [place], [place] ([id number]) Nursing Diagnoses: Knowledge deficit related to the wound healing center program Goals: Patient/caregiver will verbalize understanding of the Wound Healing Center Program Date Initiated: 05/12/2018 Target Resolution Date: 06/13/2018 Goal Status: Active Interventions: Provide education on orientation to the wound center Notes: Wound/Skin Impairment Nursing Diagnoses: Impaired tissue integrity Knowledge deficit related to ulceration/compromised skin integrity Goals: Ulcer/skin  breakdown will have a volume reduction of 80% by week 12 Date Initiated: 05/12/2018 Target Resolution Date: 09/05/2018 Goal Status: Active Interventions: Assess patient/caregiver ability to perform ulcer/skin care regimen upon admission and as needed Assess ulceration(s) every visit Notes: Electronic Signature(s) Signed: 10/06/2018 4:38:01 PM By: Arnette Norris Entered By: Arnette Norris on 10/06/2018 10:18:19 Kimura, [name] ([id number]) -------------------------------------------------------------------------------- Pain Assessment Details Patient Name: [name] A. Date of Service: 10/06/2018 10:00 AM Medical Record Number: [id number] Patient Account Number: [id number] Date of Birth/Sex: 1946-05-02 (73 y.o. F) Treating RN: Arnette Norris Primary Care Tomy Khim: Sherrie Mustache Other Clinician: Referring Lillyana Majette: Sherrie Mustache Treating Emileo Semel/Extender: Youlanda Roys in Treatment: 21 Active Problems Location of Pain Severity and Description of Pain Patient Has Paino Yes Site Locations Rate the pain. Current Pain Level: 2 Pain Management and Medication Current Pain Management: Electronic Signature(s) Signed: 10/06/2018 4:38:01 PM By: Arnette Norris Entered By: Arnette Norris on 10/06/2018 10:11:23 Bayard, [name] ([id number]) -------------------------------------------------------------------------------- Patient/Caregiver Education Details Patient Name: [name] A. Date of Service: 10/06/2018 10:00 AM Medical Record Number: [id number] Patient Account Number: [id number] Date of Birth/Gender: 01/01/1946 (73 y.o. F) Treating RN: Arnette Norris Primary Care Physician: Sherrie Mustache Other Clinician: Referring Physician: Sherrie Mustache Treating Physician/Extender: Youlanda Roys in Treatment: 21 Education Assessment Education Provided To: Patient Education Topics Provided Electronic Signature(s) Signed: 10/06/2018 4:38:01 PM By: Arnette Norris Entered By: Darliss Ridgel  Kristina on 10/06/2018 10:28:36 [name], [name] ([id number]) -------------------------------------------------------------------------------- Wound Assessment Details Patient Name: Marcia, [name] A. Date of Service: 10/06/2018 10:00 AM Medical Record Number: [id number] Patient Account Number: [id number] Date of Birth/Sex: [birth year] (73 y.o. F) Treating RN: Arnette Norris Primary Care Elijah Michaelis: Sherrie Mustache Other Clinician: Referring Kalob Bergen: Sherrie Mustache Treating Tramel Westbrook/Extender: Wilkie Aye Weeks in Treatment: 21 Wound Status Wound Number: 5 Primary Diabetic Wound/Ulcer of the Lower Etiology: Extremity Wound Location: Right, Distal, Lateral Lower Leg Wound Status: Open Wounding Event: Gradually Appeared Date Acquired: 06/28/2018 Weeks Of Treatment: 14 Clustered Wound: Yes Wound Measurements Length: (cm) 0.5 Width: (cm) 0.3 Depth: (cm) 0.1 Area: (cm) 0.118 Volume: (cm) 0.012 % Reduction in Area: 99.4% % Reduction in Volume: 99.4% Wound Description Classification: Grade 1 Periwound Skin Texture Texture Color No Abnormalities Noted: No No Abnormalities Noted: No Moisture No Abnormalities Noted: No Treatment Notes Wound #5 (Right, Distal, Lateral Lower Leg) Notes prisma, conform tape and cotton sleeve Electronic Signature(s) Signed: 10/06/2018 4:38:01 PM By: Arnette Norris Entered By: Arnette Norris on 10/06/2018 10:14:23 Vawter, [name] ([id number]) -------------------------------------------------------------------------------- Vitals Details Patient Name: [name] A. Date of Service: 10/06/2018 10:00 AM Medical Record Number: [id number] Patient Account Number: [id number] Date of Birth/Sex: 07-08-1946 (73 y.o. F) Treating RN: Arnette Norris Primary Care Tinya Cadogan: Sherrie Mustache Other Clinician: Referring Elyas Villamor: Sherrie Mustache Treating Micki Cassel/Extender: Youlanda Roys in Treatment: 21 Vital Signs Time Taken:  10:11 Temperature (F): 98.1 Height (in): 70 Pulse (bpm): 69 Weight (lbs): 193 Respiratory Rate (breaths/min): 18 Body Mass Index (BMI): 27.7 Blood Pressure (mmHg): 140/58 Reference Range: 80 - 120 mg / dl Electronic Signature(s) Signed: 10/06/2018 4:38:01 PM By: Arnette Norris Entered By: Arnette Norris on 10/06/2018 10:14:50

## 2018-10-09 DIAGNOSIS — L97919 Non-pressure chronic ulcer of unspecified part of right lower leg with unspecified severity: Secondary | ICD-10-CM | POA: Insufficient documentation

## 2018-10-09 DIAGNOSIS — E782 Mixed hyperlipidemia: Secondary | ICD-10-CM | POA: Insufficient documentation

## 2018-10-09 DIAGNOSIS — I208 Other forms of angina pectoris: Secondary | ICD-10-CM | POA: Insufficient documentation

## 2018-10-20 ENCOUNTER — Encounter: Payer: Medicare HMO | Attending: Physician Assistant | Admitting: Physician Assistant

## 2018-10-20 DIAGNOSIS — Z09 Encounter for follow-up examination after completed treatment for conditions other than malignant neoplasm: Secondary | ICD-10-CM | POA: Diagnosis present

## 2018-10-20 DIAGNOSIS — I10 Essential (primary) hypertension: Secondary | ICD-10-CM | POA: Insufficient documentation

## 2018-10-20 DIAGNOSIS — Z8249 Family history of ischemic heart disease and other diseases of the circulatory system: Secondary | ICD-10-CM | POA: Diagnosis not present

## 2018-10-20 DIAGNOSIS — L950 Livedoid vasculitis: Secondary | ICD-10-CM | POA: Insufficient documentation

## 2018-10-20 DIAGNOSIS — E1142 Type 2 diabetes mellitus with diabetic polyneuropathy: Secondary | ICD-10-CM | POA: Diagnosis not present

## 2018-10-21 NOTE — Progress Notes (Signed)
Stevenson ClinchITTS, Genita A. ([id number]) Visit Report for 10/20/2018 Chief Complaint Document Details Patient Name: [name], [name] Date of Service: 10/20/2018 10:15 AM Medical Record Number: [id number] Patient Account Number: [id number] Date of Birth/Sex: 04/11/1946 (72 y.o. F) Treating RN: Curtis Sitesorthy, Joanna Primary Care Provider: Sherrie MustacheJADALI, FAYEGH Other Clinician: Referring Provider: Sherrie MustacheJADALI, FAYEGH Treating Provider/Extender: Skeet SimmerSTONE III, Krissie Merrick Weeks in Treatment: 23 Information Obtained from: Patient Chief Complaint RLE wounds Electronic Signature(s) Signed: 10/20/2018 5:17:02 PM By: Lenda KelpStone III, Marlowe Cinquemani PA-C Entered By: Lenda KelpStone III, Royale Swamy on 10/20/2018 11:07:57 Groom, Jazzalynn AMarland [name]. ([id number]) -------------------------------------------------------------------------------- Debridement Details Patient Name: [name], [name] Date of Service: 10/20/2018 10:15 AM Medical Record Number: [id number] Patient Account Number: [id number] Date of Birth/Sex: 05/31/1946 (72 y.o. F) Treating RN: Curtis Sitesorthy, Joanna Primary Care Provider: Sherrie MustacheJADALI, FAYEGH Other Clinician: Referring Provider: Sherrie MustacheJADALI, FAYEGH Treating Provider/Extender: Linwood DibblesSTONE III, Oveda Dadamo Weeks in Treatment: 23 Debridement Performed for Wound #5 Right,Distal,Lateral Lower Leg Assessment: Performed By: Physician STONE III, Missey Hasley E., PA-C Debridement Type: Debridement Severity of Tissue Pre Fat layer exposed Debridement: Level of Consciousness (Pre- Awake and Alert procedure): Pre-procedure Verification/Time Yes - 10:57 Out Taken: Start Time: 10:57 Pain Control: Lidocaine 4% Topical Solution Total Area Debrided (L x W): 0.5 (cm) x 0.3 (cm) = 0.15 (cm) Tissue and other material Viable, Non-Viable, Eschar, Slough, Subcutaneous, Slough debrided: Level: Skin/Subcutaneous Tissue Debridement Description: Excisional Instrument: Curette Bleeding: Minimum Hemostasis Achieved: Pressure End Time: 10:59 Procedural Pain: 0 Post Procedural Pain: 0 Response to Treatment:  Procedure was tolerated well Level of Consciousness Awake and Alert (Post-procedure): Post Debridement Measurements of Total Wound Length: (cm) 0.5 Width: (cm) 0.3 Depth: (cm) 0.2 Volume: (cm) 0.024 Character of Wound/Ulcer Post Debridement: Improved Severity of Tissue Post Debridement: Fat layer exposed Post Procedure Diagnosis Same as Pre-procedure Electronic Signature(s) Signed: 10/20/2018 5:17:02 PM By: Lenda KelpStone III, Chez Bulnes PA-C Signed: 10/20/2018 5:22:36 PM By: Curtis Sitesorthy, Joanna Entered By: Curtis Sitesorthy, Joanna on 10/20/2018 10:59:06 [name], [name] ([id number]) -------------------------------------------------------------------------------- HPI Details Patient Name: [name], [name] Date of Service: 10/20/2018 10:15 AM Medical Record Number: [id number] Patient Account Number: [id number] Date of Birth/Sex: 04/08/1946 (72 y.o. F) Treating RN: Curtis Sitesorthy, Joanna Primary Care Provider: Sherrie MustacheJADALI, FAYEGH Other Clinician: Referring Provider: Sherrie MustacheJADALI, FAYEGH Treating Provider/Extender: Linwood DibblesSTONE III, Esra Frankowski Weeks in Treatment: 23 History of Present Illness HPI Description: 05/12/18-She is seen in initial evaluation for multiple open areas to the right lower extremity. She was recently hospitalized (7/30-8/2) with cellulitis of the right leg, failed outpatient therapy with Keflex. While hospitalized she was treated with IV vancomycin and cefepime; right foot x-ray was negative for osteomyelitis. She admits to intermittent pain, improved since hospitalization. She was discharged on doxycycline which she should complete with the next 48 hours. 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 She has a remote history of following with vascular medicine for venous reflux, she does not tolerate wearing compression stockings d/t skin sensitivities and "burning" sensation. In office ABI are elevated but she has strongly palpable pulses.  We will initiate hydrofera blue and ace wrap compression. She is a diabetic with A1c 6.9 while hospitalized. 05/19/18- She is seen in follow up evaluation for RLE wounds; the lateral area is healed. She did tolerate ace wrap for compression, but experienced increased xerosis. We will hold off on ace wrap and increase lachydrin to twice daily. She c/o pain to the medial cluster of wounds, there is no apparent infection and will initiate steroid cream. She will follow up next week  05/26/18 on evaluation today patient appears to actually be having some issues currently with pain. She has been utilizing the dressings as previously recommended for the lower extremities. With that being said she does appear to have a little bit of a rash she states that anytime it he's in for anything else touches her skin that she will often develop a rash. She's not even sure that should be able to tolerate a compression wrap due to the fact that again she would be concerned about even the cotton layer being in contact with her skin which overtime will cause a burning sensation. She has seen dermatology concerning this unfortunately they really did not have a good answer for her as to why this is occurring. Fortunately there is no evidence of infection at this time. 05/29/18 on evaluation today patient was actually supposed to be back in the office in order to have a dressings/wrap change is a nurse visit. With that being said she was having significant discomfort with want to touch around the wound and. It green discharge. Subsequently the concern was that she had an infection so we did convert her to a physician visit instead of just a nurse visit. Subsequently upon evaluation she does seem to have purulent discharge along with evidence of cellulitis extending up the leg which is definitely worse than when I saw her earlier in the week. Fortunately there does not appear to be any evidence of systemic infection at this  point. No fevers, chills, nausea, or vomiting noted at this time. 06/02/18 on evaluation today the patient appears to be doing better in regard to the overall appearance in regard to the wound bed. She still does have slough covering the surface of the wounds and due to pain really I do not think sharp debridement is going to be advisable at this point. Nonetheless I do feel like that she is definitely showing signs of improvement compared to her previous evaluation. Her pain level is still rather high she tells me at times 8-9/10. Unfortunately she is having a difficult time getting into pain management she states that the one that we referred her to stated it would be at least two weeks before they can get her in. 06/12/18 on evaluation today patient actually appears to be doing much better in regard to her lower extremity wounds. In fact there does not appear to be any evidence of infection at this time I do believe the Cipro has been effective her pain is also greatly improved compared to previous. Nonetheless she does have some Slough noted that is gonna require debridement. Patient's MRI was reviewed and revealed that she had no evidence of osteomyelitis, septic arthritis, or absence. She did have blisters noted on the surface of the foot which again visually we see today as well. 06/16/18 on evaluation today patient actually appears to be doing fairly well in regard to her lower extremity ulcerations. With that being said she does have issues at this point with discomfort although she did see the pain management physician this morning he did give her medication to help in this regard. She tells me that after I debrided her wound last week that she actually had significant pain to the point that she ended up having a difficult time even driving to get home. Obviously when she left the clinic she was not in that much pain this somewhat surprises me especially since we put lidocaine on her  wounds prior to applying the dressings and the  dressing we were using was Prisma which is not notorious for causing discomfort and Graser, Siena A. ([id number]) burning. Nonetheless she did obviously experience this and she tells me that she set intermittent spells where this occurred in the week since I last saw her. Nonetheless she has been given the pain medications necessary in order to keep things under control at this point. Obviously this is short term but nonetheless I think will be helpful in getting the wounds to heal so that we can appropriately debrided do what we need to do. 06/23/18 on evaluation today patient actually appears to be doing significantly better in regard to her right lower extremity ulcerations. In fact she has made dramatic improvement pretty much all locations which is great news. Fortunately there's no evidence of infection at this time. Her irritation and inflammation also is better than I do believe that the treatment utilizing the Santyl has been beneficial over the past week. I'm not even sure that we need that any further. 06/30/18 on evaluation today patient actually is doing worse compared to her previous evaluation. Unfortunately she's been having increased drainage, pain, and overall I feel like this is likely due to infection based on the appearance of her wound today. In general the patient seems to have digressed although not really back to where she started although definitely not as well she's been doing in the past couple of weeks. She's not having green drainage like she did previous but she is having a lot of drainage to the point that she's actually dripping from the posterior portion of her leg at this time. 07/07/18 on evaluation today patient actually appears to be doing rather well in regard to her right lower extremity ulcers. They also do measuring smaller which is good news she has a lot of dry and cracked skin which is gonna make her more prone  to reinfection we need to work on this. Nonetheless I do believe that in general the patient has been doing well overall with her current dressing changes and I do believe the doxycycline was beneficial she did have a Staphylococcus aureus infection fortunately this was not MRSA. The doxycycline also is appropriate for this infection. 07/21/18 on evaluation today patient's wound bed actually shows evidence of good improvement. This is really in regard to all locations. There was some dried dressing/eschar covering several of the areas but fortunately she's not having the amount of pain that she previously was experiencing. No fevers, chills, nausea, or vomiting noted at this time. 08/04/18 on evaluation today patient appears to be doing very well in regard to her right lower extremity ulcers. She's been tolerating the dressing changes without complication. Fortunately there does not appear to be any evidence of infection at this time which is excellent news. Overall very pleased with the progress that has been made. The patient is having much less pain she's even been able to cut back a little bit of the pain medication she was taking roughly 3 a day on a regular basis she struck back to two and still seems to be doing well. Overall that is great news. 08/18/18 on evaluation today patient's lower extremity wounds actually both appear to be doing excellent at this time. She has been tolerating the dressing changes without complication. Overall I've been extremely pleased with the progress that she's made. The patient likewise is having much less pain and is very happy as well. 09/08/18 on evaluation today patient actually appears to be doing excellent in  regard to her right lower extremity ulcers. She has been tolerating the dressing changes without complication which is great news. Overall I've been extremely pleased with the progress that she has made. With that being said she does still continue to  have some issue and it comes to getting this last small area to heal we've had some trouble in this regard. Nonetheless I do believe that she will do very well following a good debridement today. 09/22/18 on evaluation today patient's wound actually appears to be doing well. She does show signs of healing compared to previous. Fortunately there does not appear to be evidence of infection at this time which is good news. No fevers, chills, nausea, or vomiting noted at this time. 10/06/18 Seen today for follow up and management of right LE ulcer. Wound progressing well. No s/s of infection. Denies any recent fever, chills, or pain. 10/20/18 on evaluation today patient actually appears to be doing excellent in regard to her right lower extremity ulcer. She has been tolerating the dressing changes without complication. Overall I'm very pleased with how things appear at this point. Electronic Signature(s) Signed: 10/20/2018 5:17:02 PM By: Lenda Kelp PA-C Entered By: Lenda Kelp on 10/20/2018 17:07:23 [name], [name] ([id number]) -------------------------------------------------------------------------------- Physical Exam Details Patient Name: Marcia [name] Date of Service: 10/20/2018 10:15 AM Medical Record Number: [id number] Patient Account Number: [id number] Date of Birth/Sex: 1945/12/25 (72 y.o. F) Treating RN: Curtis Sites Primary Care Provider: Sherrie Mustache Other Clinician: Referring Provider: Sherrie Mustache Treating Provider/Extender: Linwood Dibbles, Rozetta Stumpp Weeks in Treatment: 23 Constitutional Well-nourished and well-hydrated in no acute distress. Respiratory normal breathing without difficulty. Psychiatric this patient is able to make decisions and demonstrates good insight into disease process. Alert and Oriented x 3. pleasant and cooperative. Notes Patient's wound bed currently still shows a slight opening in one location although the remaining wounds which were scattered  over the lower extremity have all healed otherwise. This looks very healthy and is a very slight area but nonetheless is not completely close as of yet. Electronic Signature(s) Signed: 10/20/2018 5:17:02 PM By: Lenda Kelp PA-C Entered By: Lenda Kelp on 10/20/2018 17:07:53 [name], [name] ([id number]) -------------------------------------------------------------------------------- Physician Orders Details Patient Name: Marcia [name] Date of Service: 10/20/2018 10:15 AM Medical Record Number: [id number] Patient Account Number: [id number] Date of Birth/Sex: Dec 01, 1945 (72 y.o. F) Treating RN: Curtis Sites Primary Care Provider: Sherrie Mustache Other Clinician: Referring Provider: Sherrie Mustache Treating Provider/Extender: Linwood Dibbles, Kalesha Irving Weeks in Treatment: 27 Verbal / Phone Orders: No Diagnosis Coding Wound Cleansing Wound #5 Right,Distal,Lateral Lower Leg o May Shower, gently pat wound dry prior to applying new dressing. Skin Barriers/Peri-Wound Care Wound #5 Right,Distal,Lateral Lower Leg o Moisturizing lotion Primary Wound Dressing Wound #5 Right,Distal,Lateral Lower Leg o Xeroform Secondary Dressing Wound #5 Right,Distal,Lateral Lower Leg o Gauze and Kerlix/Conform Dressing Change Frequency Wound #5 Right,Distal,Lateral Lower Leg o Change dressing every other day. Follow-up Appointments Wound #5 Right,Distal,Lateral Lower Leg o Return Appointment in 2 weeks. Edema Control Wound #5 Right,Distal,Lateral Lower Leg o Elevate legs to the level of the heart and pump ankles as often as possible Electronic Signature(s) Signed: 10/20/2018 5:17:02 PM By: Lenda Kelp PA-C Signed: 10/20/2018 5:22:36 PM By: Curtis Sites Entered By: Curtis Sites on 10/20/2018 10:59:55 [name], [name] ([id number]) -------------------------------------------------------------------------------- Problem List Details Patient Name: Marcia [name] Date of Service: 10/20/2018  10:15 AM Medical Record Number: [id number] Patient Account Number: [id number] Date of Birth/Sex: 07/29/1946 (72 y.o. 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 Date of Service: 10/20/2018 10:15 AM Medical Record Number: [id number] Patient Account Number: [id number] Date of Birth/Sex: 1945-12-30 (72 y.o. F) Treating RN: Curtis Sites Primary Care Provider: Sherrie Mustache Other Clinician: Referring Provider: Sherrie Mustache Treating Provider/Extender: Linwood Dibbles, Kassadee Carawan Weeks in Treatment: 23 Subjective Chief Complaint Information obtained from Patient RLE wounds History of Present Illness (HPI) 05/12/18-She is seen in initial evaluation for multiple open areas to the right lower extremity. She was recently hospitalized (7/30-8/2) with cellulitis of the right leg, failed outpatient therapy with Keflex. While hospitalized she was treated with IV vancomycin and cefepime; right foot x-ray was negative for osteomyelitis. She admits to intermittent pain, improved since hospitalization. She was discharged on doxycycline which she should complete with the next  48 hours. 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 She has a remote history of following with vascular medicine for venous reflux, she does not tolerate wearing compression stockings d/t skin sensitivities and "burning" sensation. In office ABI are elevated but she has strongly palpable pulses. We will initiate hydrofera blue and ace wrap compression. She is a diabetic with A1c 6.9 while hospitalized. 05/19/18- She is seen in follow up evaluation for RLE wounds; the lateral area is healed. She did tolerate ace wrap for compression, but experienced increased xerosis. We will hold off on ace wrap and increase lachydrin to twice daily. She c/o pain to the medial cluster of wounds, there is no apparent infection and will initiate steroid cream. She will follow up next week 05/26/18 on evaluation today patient appears to actually be having some issues currently with pain. She has been utilizing the dressings as previously recommended for the lower extremities. With that being said she does appear to have a little bit of a rash she states that anytime it he's in for anything else touches her skin that she will often develop a rash. She's not even sure that should be able to tolerate a compression wrap due to the fact that again she would be concerned about even the cotton layer being in contact with her skin which overtime will cause a burning sensation. She has seen dermatology concerning this unfortunately they really did not have a good answer for her as to why this is occurring. Fortunately there is no evidence of infection at this time. 05/29/18 on evaluation today patient was actually supposed to be back in the office in order to have a dressings/wrap change is a nurse visit. With that being said she was having significant discomfort with want to touch around the wound and. It green discharge. Subsequently  the concern was that she had an infection so we did convert her to a physician visit instead of just a nurse visit. Subsequently upon evaluation she does seem to  have purulent discharge along with evidence of cellulitis extending up the leg which is definitely worse than when I saw her earlier in the week. Fortunately there does not appear to be any evidence of systemic infection at this point. No fevers, chills, nausea, or vomiting noted at this time. 06/02/18 on evaluation today the patient appears to be doing better in regard to the overall appearance in regard to the wound bed. She still does have slough covering the surface of the wounds and due to pain really I do not think sharp debridement is going to be advisable at this point. Nonetheless I do feel like that she is definitely showing signs of improvement compared to her previous evaluation. Her pain level is still rather high she tells me at times 8-9/10. Unfortunately she is having a difficult time getting into pain management she states that the one that we referred her to stated it would be at least two weeks before they can get her in. 06/12/18 on evaluation today patient actually appears to be doing much better in regard to her lower extremity wounds. In fact there does not appear to be any evidence of infection at this time I do believe the Cipro has been effective her pain is also greatly improved compared to previous. Nonetheless she does have some Slough noted that is gonna require debridement. Patient's MRI was reviewed and revealed that she had no evidence of osteomyelitis, septic arthritis, or absence. She did have blisters noted on the surface of the foot which again visually we see today as well. DHWANI, VENKATESH ([id number]) 06/16/18 on evaluation today patient actually appears to be doing fairly well in regard to her lower extremity ulcerations. With that being said she does have issues at this point with discomfort although she  did see the pain management physician this morning he did give her medication to help in this regard. She tells me that after I debrided her wound last week that she actually had significant pain to the point that she ended up having a difficult time even driving to get home. Obviously when she left the clinic she was not in that much pain this somewhat surprises me especially since we put lidocaine on her wounds prior to applying the dressings and the dressing we were using was Prisma which is not notorious for causing discomfort and burning. Nonetheless she did obviously experience this and she tells me that she set intermittent spells where this occurred in the week since I last saw her. Nonetheless she has been given the pain medications necessary in order to keep things under control at this point. Obviously this is short term but nonetheless I think will be helpful in getting the wounds to heal so that we can appropriately debrided do what we need to do. 06/23/18 on evaluation today patient actually appears to be doing significantly better in regard to her right lower extremity ulcerations. In fact she has made dramatic improvement pretty much all locations which is great news. Fortunately there's no evidence of infection at this time. Her irritation and inflammation also is better than I do believe that the treatment utilizing the Santyl has been beneficial over the past week. I'm not even sure that we need that any further. 06/30/18 on evaluation today patient actually is doing worse compared to her previous evaluation. Unfortunately she's been having increased drainage, pain, and overall I feel like this is likely due to infection based on the appearance of her wound today. In general  the patient seems to have digressed although not really back to where she started although definitely not as well she's been doing in the past couple of weeks. She's not having green drainage like she did  previous but she is having a lot of drainage to the point that she's actually dripping from the posterior portion of her leg at this time. 07/07/18 on evaluation today patient actually appears to be doing rather well in regard to her right lower extremity ulcers. They also do measuring smaller which is good news she has a lot of dry and cracked skin which is gonna make her more prone to reinfection we need to work on this. Nonetheless I do believe that in general the patient has been doing well overall with her current dressing changes and I do believe the doxycycline was beneficial she did have a Staphylococcus aureus infection fortunately this was not MRSA. The doxycycline also is appropriate for this infection. 07/21/18 on evaluation today patient's wound bed actually shows evidence of good improvement. This is really in regard to all locations. There was some dried dressing/eschar covering several of the areas but fortunately she's not having the amount of pain that she previously was experiencing. No fevers, chills, nausea, or vomiting noted at this time. 08/04/18 on evaluation today patient appears to be doing very well in regard to her right lower extremity ulcers. She's been tolerating the dressing changes without complication. Fortunately there does not appear to be any evidence of infection at this time which is excellent news. Overall very pleased with the progress that has been made. The patient is having much less pain she's even been able to cut back a little bit of the pain medication she was taking roughly 3 a day on a regular basis she struck back to two and still seems to be doing well. Overall that is great news. 08/18/18 on evaluation today patient's lower extremity wounds actually both appear to be doing excellent at this time. She has been tolerating the dressing changes without complication. Overall I've been extremely pleased with the progress that she's made. The patient  likewise is having much less pain and is very happy as well. 09/08/18 on evaluation today patient actually appears to be doing excellent in regard to her right lower extremity ulcers. She has been tolerating the dressing changes without complication which is great news. Overall I've been extremely pleased with the progress that she has made. With that being said she does still continue to have some issue and it comes to getting this last small area to heal we've had some trouble in this regard. Nonetheless I do believe that she will do very well following a good debridement today. 09/22/18 on evaluation today patient's wound actually appears to be doing well. She does show signs of healing compared to previous. Fortunately there does not appear to be evidence of infection at this time which is good news. No fevers, chills, nausea, or vomiting noted at this time. 10/06/18 Seen today for follow up and management of right LE ulcer. Wound progressing well. No s/s of infection. Denies any recent fever, chills, or pain. 10/20/18 on evaluation today patient actually appears to be doing excellent in regard to her right lower extremity ulcer. She has been tolerating the dressing changes without complication. Overall I'm very pleased with how things appear at this point. Patient History JIN, SHOCKLEY ([id number]) Information obtained from Patient. Family History Cancer - Siblings, Diabetes - Mother,Father,Siblings, Heart Disease - Father, Hypertension -  Father, Stroke - Father,Mother, No family history of Kidney Disease, Lung Disease, Seizures, Thyroid Problems, Tuberculosis. Social History Former smoker - 10-15 years quit, Marital Status - Married, Alcohol Use - Never, Drug Use - No History, Caffeine Use - Daily. Medical And Surgical History Notes Constitutional Symptoms (General Health) vitamin d deficiency Respiratory nodule of lung Cardiovascular dyspnea hyperlipedema had vascular work up in  2011, AVV Musculoskeletal arthritis Review of Systems (ROS) Constitutional Symptoms (General Health) Denies complaints or symptoms of Fever, Chills. Respiratory The patient has no complaints or symptoms. Cardiovascular The patient has no complaints or symptoms. Psychiatric The patient has no complaints or symptoms. Objective Constitutional Well-nourished and well-hydrated in no acute distress. Vitals Time Taken: 10:32 AM, Height: 70 in, Weight: 193 lbs, BMI: 27.7, Temperature: 98.1 F, Pulse: 72 bpm, Respiratory Rate: 18 breaths/min, Blood Pressure: 134/58 mmHg. Respiratory normal breathing without difficulty. Psychiatric this patient is able to make decisions and demonstrates good insight into disease process. Alert and Oriented x 3. pleasant and cooperative. General Notes: Patient's wound bed currently still shows a slight opening in one location although the remaining wounds which were scattered over the lower extremity have all healed otherwise. This looks very healthy and is a very slight area but nonetheless is not completely close as of yet. Integumentary (Hair, Skin) [name], [name] ([id number]) Wound #5 status is Open. Original cause of wound was Gradually Appeared. The wound is located on the Right,Distal,Lateral Lower Leg. The wound measures 0.5cm length x 0.3cm width x 0.1cm depth; 0.118cm^2 area and 0.012cm^3 volume. There is no tunneling or undermining noted. There is a none present amount of drainage noted. The wound margin is flat and intact. There is no granulation within the wound bed. There is a large (67-100%) amount of necrotic tissue within the wound bed including Adherent Slough. The periwound skin appearance exhibited: Hemosiderin Staining, Erythema. The periwound skin appearance did not exhibit: Callus, Crepitus, Excoriation, Induration, Rash, Scarring, Dry/Scaly, Maceration, Atrophie Blanche, Cyanosis, Ecchymosis, Mottled, Pallor, Rubor. The surrounding  wound skin color is noted with erythema which is circumferential. Periwound temperature was noted as No Abnormality. The periwound has tenderness on palpation. 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 There was a Excisional Skin/Subcutaneous Tissue Debridement with a total area of 0.15 sq cm performed by STONE III, Alda Gaultney E., PA-C. With the following instrument(s): Curette to remove Viable and Non-Viable tissue/material. Material removed includes Eschar, Subcutaneous Tissue, and Slough after achieving pain control using Lidocaine 4% Topical Solution. No specimens were taken. A time out was conducted at 10:57, prior to the start of the procedure. A Minimum amount of bleeding was controlled with Pressure. The procedure was tolerated well with a pain level of 0 throughout and a pain level of 0 following the procedure. Post Debridement Measurements: 0.5cm length x 0.3cm width x 0.2cm depth; 0.024cm^3 volume. Character of Wound/Ulcer Post Debridement is improved. Severity of Tissue Post Debridement is: Fat layer exposed. Post procedure Diagnosis Wound #5: Same as Pre-Procedure Plan Wound Cleansing: Wound #5 Right,Distal,Lateral Lower Leg: May Shower, gently pat wound dry prior to applying new dressing. Skin Barriers/Peri-Wound Care: Wound #5 Right,Distal,Lateral Lower Leg: Moisturizing lotion Primary Wound Dressing:  Wound #5 Right,Distal,Lateral Lower Leg: Xeroform Secondary Dressing: [name], [name] ([id number]) Wound #5 Right,Distal,Lateral Lower Leg: Gauze and Kerlix/Conform Dressing Change  Frequency: Wound #5 Right,Distal,Lateral Lower Leg: Change dressing every other day. Follow-up Appointments: Wound #5 Right,Distal,Lateral Lower Leg: Return Appointment in 2 weeks. Edema Control: Wound #5 Right,Distal,Lateral Lower Leg: Elevate legs to the level of the heart and pump ankles as often as possible My suggestion currently is going to be that we go ahead and continue with the above wound care measures for the next week. The patient is in agreement with plan. Subsequently we're gonna see were things stand at follow-up. Please see above for specific wound care orders. We will see patient for re-evaluation in 2 week(s) here in the clinic. If anything worsens or changes patient will contact our office for additional recommendations. Electronic Signature(s) Signed: 10/20/2018 5:17:02 PM By: Lenda Kelp PA-C Entered By: Lenda Kelp on 10/20/2018 17:08:17 Attaway, [name] ([id number]) -------------------------------------------------------------------------------- ROS/PFSH Details Patient Name: Marcia [name] Date of Service: 10/20/2018 10:15 AM Medical Record Number: [id number] Patient Account Number: [id number] Date of Birth/Sex: Apr 20, 1946 (72 y.o. F) Treating RN: Curtis Sites Primary Care Provider: Sherrie Mustache Other Clinician: Referring Provider: Sherrie Mustache Treating Provider/Extender: Linwood Dibbles, Kimerly Rowand Weeks in Treatment: 23 Information Obtained From Patient Wound History Do you currently have one or more open woundso Yes How many open wounds do you currently haveo 5 Approximately how long have you had your woundso 6 How have you been treating your wound(s) until nowo abx Has your wound(s) ever healed and then re-openedo No Have you had any lab work done in the past montho Yes Who ordered the lab work doneo hospital Have you tested positive for an antibiotic resistant organism (MRSA, VRE)o No Have you tested positive for osteomyelitis (bone infection)o  No Have you had any tests for circulation on your legso Yes Constitutional Symptoms (General Health) Complaints and Symptoms: Negative for: Fever; Chills Medical History: Past Medical History Notes: vitamin d deficiency Eyes Medical History: Negative for: Cataracts; Glaucoma; Optic Neuritis Ear/Nose/Mouth/Throat Medical History: Negative for: Chronic sinus problems/congestion; Middle ear problems Hematologic/Lymphatic Medical History: Negative for: Anemia; Hemophilia; Human Immunodeficiency Virus; Lymphedema; Sickle Cell Disease Respiratory Complaints and Symptoms: No Complaints or Symptoms Medical History: Negative for: Aspiration; Asthma; Chronic Obstructive Pulmonary Disease (COPD); Pneumothorax; Sleep Apnea; Tuberculosis Past Medical History Notes: nodule of lung [name], Suleika A. ([id number]) Cardiovascular Complaints and Symptoms: No Complaints or Symptoms Medical History: Positive for: Hypertension Negative for: Angina; Arrhythmia; Congestive Heart Failure; Coronary Artery Disease; Deep Vein Thrombosis; Hypotension; Myocardial Infarction; Peripheral Arterial Disease; Peripheral Venous Disease; Phlebitis; Vasculitis Past Medical History Notes: dyspnea 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, AVV Gastrointestinal Medical History: Negative for: Cirrhosis ; Colitis; Crohnos; Hepatitis A; Hepatitis B; Hepatitis C Endocrine Medical History: Positive for: Type II Diabetes Time with diabetes: 10 years Treated with: Oral agents Blood sugar tested every day: Yes Tested : every mornimg Genitourinary Medical History: Negative for: End Stage Renal Disease Immunological Medical History: Negative for: Lupus Erythematosus; Raynaudos; Scleroderma Integumentary (Skin) Medical History: Negative for: History of Burn; History of pressure wounds Musculoskeletal Medical History: Negative for: Gout; Rheumatoid Arthritis; Osteoarthritis; Osteomyelitis Past Medical History  Notes: arthritis Neurologic Medical History: Positive for: Neuropathy - peripheral Negative for: Dementia; Quadriplegia; Paraplegia; Seizure Disorder Oncologic CHANTAVIA, KRAMARZ ([id number]) Medical History: Negative for: Received Chemotherapy; Received Radiation Psychiatric Complaints and Symptoms: No Complaints or Symptoms Medical History: Negative for: Anorexia/bulimia; Confinement Anxiety Immunizations Pneumococcal Vaccine: Received Pneumococcal Vaccination: Yes Tetanus  Vaccine: Last tetanus shot: 05/12/2014 Immunization Notes: plan on flu shot Implantable Devices Family and Social History Cancer: Yes - Siblings; Diabetes: Yes - Mother,Father,Siblings; Heart Disease: Yes - Father; Hypertension: Yes - Father; Kidney Disease: No; Lung Disease: No; Seizures: No; Stroke: Yes - Father,Mother; Thyroid Problems: No; Tuberculosis: No; Former smoker - 10-15 years quit; Marital Status - Married; Alcohol Use: Never; Drug Use: No History; Caffeine Use: Daily; Financial Concerns: No; Food, Clothing or Shelter Needs: No; Support System Lacking: No; Transportation Concerns: No; Advanced Directives: No; Patient does not want information on Advanced Directives; Living Will: No; Medical Power of Attorney: No Physician Affirmation I have reviewed and agree with the above information. Electronic Signature(s) Signed: 10/20/2018 5:17:02 PM By: Lenda Kelp PA-C Signed: 10/20/2018 5:22:36 PM By: Curtis Sites Entered By: Lenda Kelp on 10/20/2018 17:07:37 Kutter, [name] ([id number]) -------------------------------------------------------------------------------- SuperBill Details Patient Name: Marcia [name] Date of Service: 10/20/2018 Medical Record Number: [id number] Patient Account Number: [id number] Date of Birth/Sex: 1946-01-13 (73 y.o. F) Treating RN: Curtis Sites Primary Care Provider: Sherrie Mustache Other Clinician: Referring Provider: Sherrie Mustache Treating Provider/Extender:  Linwood Dibbles, Alliah Boulanger Weeks in Treatment: 23 Diagnosis Coding ICD-10 Codes Code Description 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 L97.212 Non-pressure chronic ulcer of right calf with fat layer exposed L97.312 Non-pressure chronic ulcer of right ankle with fat layer exposed E11.622 Type 2 diabetes mellitus with other skin ulcer Facility Procedures CPT4 Code: 78295621 Description: 11042 - DEB SUBQ TISSUE 20 SQ CM/< ICD-10 Diagnosis Description L97.212 Non-pressure chronic ulcer of right calf with fat layer exp Modifier: osed Quantity: 1 Physician Procedures CPT4 Code: 3086578 Description: 11042 - WC PHYS SUBQ TISS 20 SQ CM ICD-10 Diagnosis Description L97.212 Non-pressure chronic ulcer of right calf with fat layer exp Modifier: osed Quantity: 1 Electronic Signature(s) Signed: 10/20/2018 5:17:02 PM By: Lenda Kelp PA-C Entered By: Lenda Kelp on 10/20/2018 17:09:11

## 2018-10-23 NOTE — Progress Notes (Signed)
SAGRARIO, GENSHEIMER ([id number]) Visit Report for 10/20/2018 Arrival Information Details Patient Name: [name], [name] Date of Service: 10/20/2018 10:15 AM Medical Record Number: [id number] Patient Account Number: [id number] Date of Birth/Sex: 1945/11/10 (72 y.o. F) Treating RN: Curtis Sites Primary Care Jannel Lynne: Sherrie Mustache Other Clinician: Referring Demaryius Imran: Sherrie Mustache Treating Kelden Lavallee/Extender: Linwood Dibbles, HOYT Weeks in Treatment: 23 Visit Information History Since Last Visit Added or deleted any medications: Yes Patient Arrived: Ambulatory Any new allergies or adverse reactions: No Arrival Time: 10:31 Had a fall or experienced change in No Accompanied By: self activities of daily living that may affect Transfer Assistance: None risk of falls: Patient Identification Verified: Yes Signs or symptoms of abuse/neglect since last visito No Secondary Verification Process Completed: Yes Hospitalized since last visit: No Patient Requires Transmission-Based No Implantable device outside of the clinic excluding No Precautions: cellular tissue based products placed in the center Patient Has Alerts: Yes since last visit: Patient Alerts: DMII Has Dressing in Place as Prescribed: Yes Pain Present Now: No Electronic Signature(s) Signed: 10/20/2018 1:47:13 PM By: Dayton Martes RCP, RRT, CHT Entered By: Dayton Martes on 10/20/2018 10:32:06 Wisniewski, [name] ([id number]) -------------------------------------------------------------------------------- Encounter Discharge Information Details Patient Name: Marcia [name] Date of Service: 10/20/2018 10:15 AM Medical Record Number: [id number] Patient Account Number: [id number] Date of Birth/Sex: [birth year] (72 y.o. F) Treating RN: Curtis Sites Primary Care Siddhi Dornbush: Sherrie Mustache Other Clinician: Referring Gracious Renken: Sherrie Mustache Treating Jamear Carbonneau/Extender: Linwood Dibbles, HOYT Weeks in Treatment: 84 Encounter  Discharge Information Items Post Procedure Vitals Discharge Condition: Stable Temperature (F): 98.1 Ambulatory Status: Ambulatory Pulse (bpm): 72 Discharge Destination: Home Respiratory Rate (breaths/min): 18 Transportation: Private Auto Blood Pressure (mmHg): 134/58 Accompanied By: self Schedule Follow-up Appointment: Yes Clinical Summary of Care: Electronic Signature(s) Signed: 10/20/2018 5:22:36 PM By: Curtis Sites Entered By: Curtis Sites on 10/20/2018 11:01:12 Graw, [name] ([id number]) -------------------------------------------------------------------------------- Lower Extremity Assessment Details Patient Name: Marcia [name] Date of Service: 10/20/2018 10:15 AM Medical Record Number: [id number] Patient Account Number: [id number] Date of Birth/Sex: Dec 12, 1945 (72 y.o. F) Treating RN: Arnette Norris Primary Care Belladonna Lubinski: Sherrie Mustache Other Clinician: Referring Alphonse Asbridge: Sherrie Mustache Treating Morio Widen/Extender: Linwood Dibbles, HOYT Weeks in Treatment: 23 Electronic Signature(s) Signed: 10/22/2018 4:21:01 PM By: Arnette Norris Entered By: Arnette Norris on 10/20/2018 10:42:53 Piasecki, [name] ([id number]) -------------------------------------------------------------------------------- Multi Wound Chart Details Patient Name: Marcia [name] Date of Service: 10/20/2018 10:15 AM Medical Record Number: [id number] Patient Account Number: [id number] Date of Birth/Sex: [birth year] (72 y.o. 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] [N/A:N/A] Comorbid History: [5:Hypertension, Type II Diabetes, Neuropathy] [N/A:N/A] Date Acquired: [5:06/28/2018] [N/A:N/A] Weeks of Treatment: [5:16] [N/A:N/A] Wound Status: [5:Open] [N/A:N/A] Clustered Wound: [5:Yes] [N/A:N/A] Measurements L x W x D [5:0.5x0.3x0.1] [N/A:N/A] (cm) Area (cm) : [5:0.118] [N/A:N/A] Volume (cm) : [5:0.012] [N/A:N/A] % Reduction in Area: [5:99.40%] [N/A:N/A] % Reduction in Volume: [5:99.40%] [N/A:N/A] Classification: [5:Grade 1] [N/A:N/A] Exudate Amount: [5:None Present] [N/A:N/A] Wound Margin: [5:Flat and Intact] [N/A:N/A] Granulation Amount: [5:None Present (0%)] [N/A:N/A] Necrotic Amount: [5:Large (67-100%)] [N/A:N/A] Exposed Structures: [5:Fascia: No Fat Layer (Subcutaneous Tissue) Exposed: No Tendon: No Muscle: No Joint: No Bone: No] [N/A:N/A]  Periwound Skin Texture: [5:Excoriation: No Induration: No Callus: No Crepitus: No Rash: No Scarring: No] [N/A:N/A] Periwound Skin Moisture: [N/A:N/A] Maceration: No Dry/Scaly: No Periwound Skin Color: Erythema: Yes N/A N/A Hemosiderin Staining: Yes Atrophie Blanche: No Cyanosis: No Ecchymosis: No Mottled: No Pallor: No Rubor: No Erythema Location: Circumferential N/A N/A Temperature: No Abnormality N/A N/A Tenderness on Palpation: Yes N/A N/A Wound Preparation: Ulcer Cleansing: N/A N/A Rinsed/Irrigated with Saline Topical Anesthetic Applied: Other: lidocaine 4% Treatment Notes Electronic Signature(s) Signed: 10/20/2018 5:22:36 PM By: Curtis Sites Entered By: Curtis Sites on 10/20/2018 10:57:18 Beckles, [name] ([id number]) -------------------------------------------------------------------------------- Multi-Disciplinary Care Plan Details Patient Name: Marcia [name] Date of Service: 10/20/2018 10:15 AM Medical Record Number: [id number] Patient Account Number: [id number] Date of Birth/Sex: Jun 14, 1946 (72  y.o. F) Treating RN: Curtis Sites Primary Care Devora Tortorella: Sherrie Mustache Other Clinician: Referring Marlita Keil: Sherrie Mustache Treating Teresea Donley/Extender: Linwood Dibbles, HOYT Weeks in Treatment: 23 Active Inactive Abuse / Safety / Falls / Self Care Management Nursing Diagnoses: Potential for falls Goals: Patient will not experience any injury related to falls Date Initiated: 05/12/2018 Target Resolution Date: 08/15/2018 Goal Status: Active Interventions: Assess Activities of Daily Living upon admission and as needed Assess fall risk on admission and as needed Assess: immobility, friction, shearing, incontinence upon admission and as needed Assess impairment of mobility on admission and as needed per policy Assess personal safety and home safety (as indicated) on admission and as needed Notes: Nutrition Nursing Diagnoses: Imbalanced nutrition Impaired glucose control: actual or potential Goals: Patient/caregiver agrees to and verbalizes understanding of need to use nutritional supplements and/or vitamins as prescribed Date Initiated: 05/12/2018 Target Resolution Date: 09/12/2018 Goal Status: Active Patient/caregiver will maintain therapeutic glucose control Date Initiated: 05/12/2018 Target Resolution Date: 08/15/2018 Goal Status: Active Interventions: Assess patient nutrition upon admission and as needed per policy Provide education on elevated blood sugars and impact on wound healing Provide education on nutrition Notes: Orientation to the Wound Care Program [name], [name] ([id number]) Nursing Diagnoses: Knowledge deficit related to the wound healing center program Goals: Patient/caregiver will verbalize understanding of the Wound Healing Center Program Date Initiated: 05/12/2018 Target Resolution Date: 06/13/2018 Goal Status: Active Interventions: Provide education on orientation to the wound center Notes: Wound/Skin Impairment Nursing Diagnoses: Impaired tissue  integrity Knowledge deficit related to ulceration/compromised skin integrity Goals: Ulcer/skin breakdown will have a volume reduction of 80% by week 12 Date Initiated: 05/12/2018 Target Resolution Date: 09/05/2018 Goal Status: Active Interventions: Assess patient/caregiver ability to perform ulcer/skin care regimen upon admission and as needed Assess ulceration(s) every visit Notes: Electronic Signature(s) Signed: 10/20/2018 5:22:36 PM By: Curtis Sites Entered By: Curtis Sites on 10/20/2018 10:56:43 [name], [name] AMarland Kitchen ([id number]) -------------------------------------------------------------------------------- Pain Assessment Details Patient Name: Marcia [name] Date of Service: 10/20/2018 10:15 AM Medical Record Number: [id number] Patient Account Number: [id number] Date of Birth/Sex: [birth year] (72 y.o. F) Treating RN: Curtis Sites Primary Care Keyan Folson: Sherrie Mustache Other Clinician: Referring Darra Rosa: Sherrie Mustache Treating Nyx Keady/Extender: Linwood Dibbles, HOYT Weeks in Treatment: 23 Active Problems Location of Pain Severity and Description of Pain Patient Has Paino No Site Locations Pain Management and Medication Current Pain Management: Electronic Signature(s) Signed: 10/20/2018 1:47:13 PM By: Sallee Provencal, RRT, CHT Signed: 10/20/2018 5:22:36 PM By: Curtis Sites Entered By: Dayton Martes on 10/20/2018 10:32:14 [name], [name] ([id number]) -------------------------------------------------------------------------------- Patient/Caregiver Education Details Patient Name: Marcia [name] Date of Service: 10/20/2018 10:15 AM Medical Record Number: [id number] Patient Account Number: [id number] Date of Birth/Gender: 1946/01/04 (73 y.o. F) Treating RN: Curtis Sites Primary  Care Physician: Sherrie Mustache Other Clinician: Referring Physician: Sherrie Mustache Treating Physician/Extender: Skeet Simmer in Treatment: 75 Education  Assessment Education Provided To: Patient Education Topics Provided Venous: Handouts: Other: need for compression socks Methods: Explain/Verbal Responses: State content correctly Electronic Signature(s) Signed: 10/20/2018 5:22:36 PM By: Curtis Sites Entered By: Curtis Sites on 10/20/2018 11:01:30 [name], [name] AMarland Kitchen ([id number]) -------------------------------------------------------------------------------- Wound Assessment Details Patient Name: Marcia [name] Date of Service: 10/20/2018 10:15 AM Medical Record Number: [id number] Patient Account Number: [id number] Date of Birth/Sex: Feb 20, 1946 (72 y.o. F) Treating RN: Arnette Norris Primary Care Ethelreda Sukhu: Sherrie Mustache Other Clinician: Referring Porcha Deblanc: Sherrie Mustache Treating Konrad Hoak/Extender: Linwood Dibbles, HOYT Weeks in Treatment: 23 Wound Status Wound Number: 5 Primary Etiology: Diabetic Wound/Ulcer of the Lower Extremity Wound Location: Right Lower Leg - Lateral, Distal Wound Status: Open Wounding Event: Gradually Appeared Comorbid Hypertension, Type II Diabetes, Date Acquired: 06/28/2018 History: Neuropathy Weeks Of Treatment: 16 Clustered Wound: Yes Photos Photo Uploaded By: Arnette Norris on 10/20/2018 13:20:09 Wound Measurements Length: (cm) 0.5 Width: (cm) 0.3 Depth: (cm) 0.1 Area: (cm) 0.118 Volume: (cm) 0.012 % Reduction in Area: 99.4% % Reduction in Volume: 99.4% Tunneling: No Undermining: No Wound Description Classification: Grade 1 Wound Margin: Flat and Intact Exudate Amount: None Present Foul Odor After Cleansing: No Slough/Fibrino Yes Wound Bed Granulation Amount: None Present (0%) Exposed Structure Necrotic Amount: Large (67-100%) Fascia Exposed: No Necrotic Quality: Adherent Slough Fat Layer (Subcutaneous Tissue) Exposed: No Tendon Exposed: No Muscle Exposed: No Joint Exposed: No Bone Exposed: No Periwound Skin Texture Texture Color No Abnormalities Noted: No No Abnormalities  Noted: No [name], [name] ([id number]) Callus: No Atrophie Blanche: No Crepitus: No Cyanosis: No Excoriation: No Ecchymosis: No Induration: No Erythema: Yes Rash: No Erythema Location: Circumferential Scarring: No Hemosiderin Staining: Yes Mottled: No Moisture Pallor: No No Abnormalities Noted: No Rubor: No Dry / Scaly: No Maceration: No Temperature / Pain Temperature: No Abnormality Tenderness on Palpation: Yes Wound Preparation Ulcer Cleansing: Rinsed/Irrigated with Saline Topical Anesthetic Applied: Other: lidocaine 4%, Treatment Notes Wound #5 (Right, Distal, Lateral Lower Leg) Notes xeroform, gauze, conform tape and cotton sleeve Electronic Signature(s) Signed: 10/22/2018 4:21:01 PM By: Arnette Norris Entered By: Arnette Norris on 10/20/2018 10:42:40 [name], [name] ([id number]) -------------------------------------------------------------------------------- Vitals Details Patient Name: Marcia [name] Date of Service: 10/20/2018 10:15 AM Medical Record Number: [id number] Patient Account Number: [id number] Date of Birth/Sex: 1945-10-09 (72 y.o. F) Treating RN: Curtis Sites Primary Care Mateo Overbeck: Sherrie Mustache Other Clinician: Referring Binta Statzer: Sherrie Mustache Treating Marilena Trevathan/Extender: Linwood Dibbles, HOYT Weeks in Treatment: 23 Vital Signs Time Taken: 10:32 Temperature (F): 98.1 Height (in): 70 Pulse (bpm): 72 Weight (lbs): 193 Respiratory Rate (breaths/min): 18 Body Mass Index (BMI): 27.7 Blood Pressure (mmHg): 134/58 Reference Range: 80 - 120 mg / dl Electronic Signature(s) Signed: 10/20/2018 1:47:13 PM By: Dayton Martes RCP, RRT, CHT Entered By: Dayton Martes on 10/20/2018 10:34:58

## 2018-10-29 DIAGNOSIS — E538 Deficiency of other specified B group vitamins: Secondary | ICD-10-CM | POA: Insufficient documentation

## 2018-10-29 DIAGNOSIS — Z0000 Encounter for general adult medical examination without abnormal findings: Secondary | ICD-10-CM | POA: Insufficient documentation

## 2018-11-03 ENCOUNTER — Encounter: Payer: Medicare HMO | Admitting: Physician Assistant

## 2018-11-03 DIAGNOSIS — Z09 Encounter for follow-up examination after completed treatment for conditions other than malignant neoplasm: Secondary | ICD-10-CM | POA: Diagnosis not present

## 2018-11-05 NOTE — Progress Notes (Signed)
Stevenson ClinchITTS, [name] ([id number]) Visit Report for 11/03/2018 Arrival Information Details Patient Name: [name], [name] Date of Service: 11/03/2018 9:45 AM Medical Record Number: [id number] Patient Account Number: [id number] Date of Birth/Sex: 10/04/1946 (73 y.o. F) Treating RN: Curtis Sitesorthy, Joanna Primary Care Linden Mikes: Sherrie MustacheJADALI, Marcia Other Clinician: Referring Toia Micale: Sherrie MustacheJADALI, Marcia Treating Amiylah Anastos/Extender: Linwood DibblesSTONE III, HOYT Weeks in Treatment: 25 Visit Information History Since Last Visit Added or deleted any medications: Yes Patient Arrived: Ambulatory Any new allergies or adverse reactions: No Arrival Time: 09:51 Had a fall or experienced change in No Accompanied By: self activities of daily living that may affect Transfer Assistance: None risk of falls: Patient Identification Verified: Yes Signs or symptoms of abuse/neglect since last visito No Secondary Verification Process Completed: Yes Hospitalized since last visit: No Patient Requires Transmission-Based No Implantable device outside of the clinic excluding No Precautions: cellular tissue based products placed in the center Patient Has Alerts: Yes since last visit: Patient Alerts: DMII Pain Present Now: Yes Electronic Signature(s) Signed: 11/03/2018 4:05:03 PM By: Dayton MartesWallace, RCP,RRT,CHT, Sallie RCP, RRT, CHT Entered By: Weyman RodneyWallace, RCP,RRT,CHT, Lucio EdwardSallie on 11/03/2018 09:53:09 [name], [name] ([id number]) -------------------------------------------------------------------------------- Clinic Level of Care Assessment Details Patient Name: [name], [name] Date of Service: 11/03/2018 9:45 AM Medical Record Number: [id number] Patient Account Number: [id number] Date of Birth/Sex: 09/18/1946 (73 y.o. F) Treating RN: Curtis Sitesorthy, Joanna Primary Care Ketina Mars: Sherrie MustacheJADALI, Marcia Other Clinician: Referring Obe Ahlers: Sherrie MustacheJADALI, Marcia Treating Fidencia Mccloud/Extender: Linwood DibblesSTONE III, HOYT Weeks in Treatment: 25 Clinic Level of Care Assessment Items TOOL 4 Quantity  Score []  - Use when only an EandM is performed on FOLLOW-UP visit 0 ASSESSMENTS - Nursing Assessment / Reassessment X - Reassessment of Co-morbidities (includes updates in patient status) 1 10 X- 1 5 Reassessment of Adherence to Treatment Plan ASSESSMENTS - Wound and Skin Assessment / Reassessment X - Simple Wound Assessment / Reassessment - one wound 1 5 []  - 0 Complex Wound Assessment / Reassessment - multiple wounds []  - 0 Dermatologic / Skin Assessment (not related to wound area) ASSESSMENTS - Focused Assessment X - Circumferential Edema Measurements - multi extremities 1 5 []  - 0 Nutritional Assessment / Counseling / Intervention X- 1 5 Lower Extremity Assessment (monofilament, tuning fork, pulses) []  - 0 Peripheral Arterial Disease Assessment (using hand held doppler) ASSESSMENTS - Ostomy and/or Continence Assessment and Care []  - Incontinence Assessment and Management 0 []  - 0 Ostomy Care Assessment and Management (repouching, etc.) PROCESS - Coordination of Care X - Simple Patient / Family Education for ongoing care 1 15 []  - 0 Complex (extensive) Patient / Family Education for ongoing care X- 1 10 Staff obtains ChiropractorConsents, Records, Test Results / Process Orders []  - 0 Staff telephones HHA, Nursing Homes / Clarify orders / etc []  - 0 Routine Transfer to another Facility (non-emergent condition) []  - 0 Routine Hospital Admission (non-emergent condition) []  - 0 New Admissions / Manufacturing engineernsurance Authorizations / Ordering NPWT, Apligraf, etc. []  - 0 Emergency Hospital Admission (emergent condition) X- 1 10 Simple Discharge Coordination [name], [name] ([id number]) []  - 0 Complex (extensive) Discharge Coordination PROCESS - Special Needs []  - Pediatric / Minor Patient Management 0 []  - 0 Isolation Patient Management []  - 0 Hearing / Language / Visual special needs []  - 0 Assessment of Community assistance (transportation, D/C planning, etc.) []  - 0 Additional assistance  / Altered mentation []  - 0 Support Surface(s) Assessment (bed, cushion, seat, etc.) INTERVENTIONS - Wound Cleansing / Measurement X - Simple Wound Cleansing - one wound 1 5 []  - 0  Complex Wound Cleansing - multiple wounds X- 1 5 Wound Imaging (photographs - any number of wounds) []  - 0 Wound Tracing (instead of photographs) X- 1 5 Simple Wound Measurement - one wound []  - 0 Complex Wound Measurement - multiple wounds INTERVENTIONS - Wound Dressings []  - Small Wound Dressing one or multiple wounds 0 []  - 0 Medium Wound Dressing one or multiple wounds []  - 0 Large Wound Dressing one or multiple wounds []  - 0 Application of Medications - topical []  - 0 Application of Medications - injection INTERVENTIONS - Miscellaneous []  - External ear exam 0 []  - 0 Specimen Collection (cultures, biopsies, blood, body fluids, etc.) []  - 0 Specimen(s) / Culture(s) sent or taken to Lab for analysis []  - 0 Patient Transfer (multiple staff / Nurse, adult / Similar devices) []  - 0 Simple Staple / Suture removal (25 or less) []  - 0 Complex Staple / Suture removal (26 or more) []  - 0 Hypo / Hyperglycemic Management (close monitor of Blood Glucose) []  - 0 Ankle / Brachial Index (ABI) - do not check if billed separately X- 1 5 Vital Signs Routt, [name] ([id number]) Has the patient been seen at the hospital within the last three years: Yes Total Score: 85 Level Of Care: New/Established - Level 3 Electronic Signature(s) Signed: 11/03/2018 4:47:13 PM By: Curtis Sites Entered By: Curtis Sites on 11/03/2018 10:30:52 [name], [name] ([id number]) -------------------------------------------------------------------------------- Encounter Discharge Information Details Patient Name: Marcia [name] Date of Service: 11/03/2018 9:45 AM Medical Record Number: [id number] Patient Account Number: [id number] Date of Birth/Sex: [birth year] (73 y.o. F) Treating RN: Curtis Sites Primary Care Ayman Brull:  Sherrie Mustache Other Clinician: Referring Jordain Radin: Sherrie Mustache Treating Alazae Crymes/Extender: Linwood Dibbles, HOYT Weeks in Treatment: 25 Encounter Discharge Information Items Discharge Condition: Stable Ambulatory Status: Ambulatory Discharge Destination: Home Transportation: Private Auto Accompanied By: self Schedule Follow-up Appointment: No Clinical Summary of Care: Electronic Signature(s) Signed: 11/03/2018 4:47:13 PM By: Curtis Sites Entered By: Curtis Sites on 11/03/2018 10:31:08 [name], [name] ([id number]) -------------------------------------------------------------------------------- Lower Extremity Assessment Details Patient Name: Marcia [name] Date of Service: 11/03/2018 9:45 AM Medical Record Number: [id number] Patient Account Number: [id number] Date of Birth/Sex: 09-24-1946 (73 y.o. F) Treating RN: [name] Primary Care Alicyn Klann: Sherrie Mustache Other Clinician: Referring Cadyn Fann: Sherrie Mustache Treating Kailani Brass/Extender: Linwood Dibbles, HOYT Weeks in Treatment: 25 Edema Assessment Assessed: [Left: No] [Right: No] Edema: [Left: N] [Right: o] Calf Left: Right: Point of Measurement: 31 cm From Medial Instep cm 39 cm Ankle Left: Right: Point of Measurement: 16 cm From Medial Instep cm 20.5 cm Vascular Assessment Claudication: Claudication Assessment [Right:None] Pulses: Dorsalis Pedis Palpable: [Right:Yes] Posterior Tibial Extremity colors, hair growth, and conditions: Extremity Color: [Right:Normal] Hair Growth on Extremity: [Right:No] Temperature of Extremity: [Right:Warm] Capillary Refill: [Right:< 3 seconds] Toe Nail Assessment Left: Right: Thick: Yes Discolored: No Deformed: No Improper Length and Hygiene: No Electronic Signature(s) Signed: 11/03/2018 11:27:43 AM By: [name] Entered By: [name] on 11/03/2018 10:01:57 [name], [name] ([id number]) -------------------------------------------------------------------------------- Multi-Disciplinary  Care Plan Details Patient Name: Marcia [name] Date of Service: 11/03/2018 9:45 AM Medical Record Number: [id number] Patient Account Number: [id number] Date of Birth/Sex: 1945-12-02 (73 y.o. F) Treating RN: Curtis Sites Primary Care Jariana Shumard: Sherrie Mustache Other Clinician: Referring Glynn Freas: Sherrie Mustache Treating Mairin Lindsley/Extender: Linwood Dibbles, HOYT Weeks in Treatment: 25 Active Inactive Electronic Signature(s) Signed: 11/03/2018 4:47:13 PM By: Curtis Sites Entered By: Curtis Sites on 11/03/2018 10:29:39 [name], [name] ([id number]) -------------------------------------------------------------------------------- Pain Assessment Details Patient Name: Marcia [name] Date of  Service: 11/03/2018 9:45 AM Medical Record Number: [id number] Patient Account Number: [id number] Date of Birth/Sex: May 01, 1946 (72 y.o. F) Treating RN: Curtis Sites Primary Care Belina Mandile: Sherrie Mustache Other Clinician: Referring Marquisha Nikolov: Sherrie Mustache Treating Charnee Turnipseed/Extender: Linwood Dibbles, HOYT Weeks in Treatment: 25 Active Problems Location of Pain Severity and Description of Pain Patient Has Paino Yes Site Locations Rate the pain. Current Pain Level: 1 Pain Management and Medication Current Pain Management: Electronic Signature(s) Signed: 11/03/2018 4:05:03 PM By: Sallee Provencal, RRT, CHT Signed: 11/03/2018 4:47:13 PM By: Curtis Sites Entered By: Dayton Martes on 11/03/2018 09:53:20 [name], [name] ([id number]) -------------------------------------------------------------------------------- Patient/Caregiver Education Details Patient Name: Marcia [name] Date of Service: 11/03/2018 9:45 AM Medical Record Number: [id number] Patient Account Number: [id number] Date of Birth/Gender: 06-Jan-1946 (73 y.o. F) Treating RN: Curtis Sites Primary Care Physician: Sherrie Mustache Other Clinician: Referring Physician: Sherrie Mustache Treating Physician/Extender: Skeet Simmer in Treatment: 25 Education Assessment Education Provided To: Patient Education Topics Provided Basic Hygiene: Handouts: Other: care of newly healed ulcer site Methods: Explain/Verbal Responses: State content correctly Electronic Signature(s) Signed: 11/03/2018 4:47:13 PM By: Curtis Sites Entered By: Curtis Sites on 11/03/2018 10:31:31 [name], [name] Kitchen ([id number]) -------------------------------------------------------------------------------- Wound Assessment Details Patient Name: Marcia [name] Date of Service: 11/03/2018 9:45 AM Medical Record Number: [id number] Patient Account Number: [id number] Date of Birth/Sex: [birth year] (73 y.o. F) Treating RN: Curtis Sites Primary Care Sherrin Stahle: Sherrie Mustache Other Clinician: Referring Jules Vidovich: Sherrie Mustache Treating Trigg Delarocha/Extender: Linwood Dibbles, HOYT Weeks in Treatment: 25 Wound Status Wound Number: 5 Primary Etiology: Diabetic Wound/Ulcer of the Lower Extremity Wound Location: Right, Distal, Lateral Lower Leg Wound Status: Healed - Epithelialized Wounding Event: Gradually Appeared Comorbid Hypertension, Type II Diabetes, Date Acquired: 06/28/2018 History: Neuropathy Weeks Of Treatment: 18 Clustered Wound: Yes Photos Photo Uploaded By: [name] on 11/03/2018 10:28:55 Wound Measurements Length: (cm) 0 % R Width: (cm) 0 % R Depth: (cm) 0 Tun Area: (cm) 0 Un Volume: (cm) 0 eduction in Area: 100% eduction in Volume: 100% neling: No dermining: No Wound Description Classification: Grade 1 Wound Margin: Flat and Intact Exudate Amount: None Present Foul Odor After Cleansing: No Slough/Fibrino Yes Wound Bed Granulation Amount: None Present (0%) Exposed Structure Necrotic Amount: None Present (0%) Fascia Exposed: No Fat Layer (Subcutaneous Tissue) Exposed: Yes Tendon Exposed: No Muscle Exposed: No Joint Exposed: No Bone Exposed: No Periwound Skin Texture Texture Color No Abnormalities Noted: No No  Abnormalities Noted: No Esco, Analeigha A. ([id number]) Callus: No Atrophie Blanche: No Crepitus: No Cyanosis: No Excoriation: No Ecchymosis: No Induration: No Erythema: No Rash: No Hemosiderin Staining: Yes Scarring: No Mottled: No Pallor: No Moisture Rubor: No No Abnormalities Noted: No Dry / Scaly: No Temperature / Pain Maceration: No Temperature: No Abnormality Tenderness on Palpation: Yes Wound Preparation Ulcer Cleansing: Rinsed/Irrigated with Saline Topical Anesthetic Applied: Other: lidocaine 4%, Electronic Signature(s) Signed: 11/03/2018 4:47:13 PM By: Curtis Sites Entered By: Curtis Sites on 11/03/2018 10:29:52 [name], [name] ([id number]) -------------------------------------------------------------------------------- Vitals Details Patient Name: Marcia [name] Date of Service: 11/03/2018 9:45 AM Medical Record Number: [id number] Patient Account Number: [id number] Date of Birth/Sex: 09-10-1946 (73 y.o. F) Treating RN: Curtis Sites Primary Care Iori Gigante: Sherrie Mustache Other Clinician: Referring Ammy Lienhard: Sherrie Mustache Treating Chandi Nicklin/Extender: Linwood Dibbles, HOYT Weeks in Treatment: 25 Vital Signs Time Taken: 09:52 Temperature (F): 97.7 Height (in): 70 Pulse (bpm): 69 Weight (lbs): 193 Respiratory Rate (breaths/min): 16 Body Mass Index (BMI): 27.7 Blood Pressure (mmHg): 140/54 Reference Range: 80 - 120 mg / dl Airway Electronic  Signature(s) Signed: 11/03/2018 4:05:03 PM By: Dayton Martes RCP, RRT, CHT Entered By: Dayton Martes on 11/03/2018 09:54:22

## 2018-11-06 NOTE — Progress Notes (Signed)
Marcia, [name] ([id number]) Visit Report for 11/03/2018 Chief Complaint Document Details Patient Name: [name], [name] Date of Service: 11/03/2018 9:45 AM Medical Record Number: [id number] Patient Account Number: [id number] Date of Birth/Sex: [birth year] (73 y.o. F) Treating RN: Curtis Sites Primary Care Provider: Sherrie Mustache Other Clinician: Referring Provider: Sherrie Mustache Treating Provider/Extender: Skeet Simmer in Treatment: 25 Information Obtained from: Patient Chief Complaint RLE wounds Electronic Signature(s) Signed: 11/05/2018 1:01:02 AM By: Lenda Kelp PA-C Entered By: Lenda Kelp on 11/03/2018 10:03:21 [name], [name] ([id number]) -------------------------------------------------------------------------------- HPI Details Patient Name: Marcia [name] Date of Service: 11/03/2018 9:45 AM Medical Record Number: [id number] Patient Account Number: [id number] Date of Birth/Sex: 1946/04/26 (73 y.o. F) Treating RN: Curtis Sites Primary Care Provider: Sherrie Mustache Other Clinician: Referring Provider: Sherrie Mustache Treating Provider/Extender: Linwood Dibbles, [name] Weeks in Treatment: 25 History of Present Illness HPI Description: 05/12/18-She is seen in initial evaluation for multiple open areas to the right lower extremity. She was recently hospitalized (7/30-8/2) with cellulitis of the right leg, failed outpatient therapy with Keflex. While hospitalized she was treated with IV vancomycin and cefepime; right foot x-ray was negative for osteomyelitis. She admits to intermittent pain, improved since hospitalization. She was discharged on doxycycline which she should complete with the next 48 hours. 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 She has a remote history of following with vascular medicine for venous reflux, she does not tolerate wearing compression stockings d/t skin  sensitivities and "burning" sensation. In office ABI are elevated but she has strongly palpable pulses. We will initiate hydrofera blue and ace wrap compression. She is a diabetic with A1c 6.9 while hospitalized. 05/19/18- She is seen in follow up evaluation for RLE wounds; the lateral area is healed. She did tolerate ace wrap for compression, but experienced increased xerosis. We will hold off on ace wrap and increase lachydrin to twice daily. She c/o pain to the medial cluster of wounds, there is no apparent infection and will initiate steroid cream. She will follow up next week 05/26/18 on evaluation today patient appears to actually be having some issues currently with pain. She has been utilizing the dressings as previously recommended for the lower extremities. With that being said she does appear to have a little bit of a rash she states that anytime it he's in for anything else touches her skin that she will often develop a rash. She's not even sure that should be able to tolerate a compression wrap due to the fact that again she would be concerned about even the cotton layer being in contact with her skin which overtime will cause a burning sensation. She has seen dermatology concerning this unfortunately they really did not have a good answer for her as to why this is occurring. Fortunately there is no evidence of infection at this time. 05/29/18 on evaluation today patient was actually supposed to be back in the office in order to have a dressings/wrap change is a nurse visit. With that being said she was having significant discomfort with want to touch around the wound and. It green discharge. Subsequently the concern was that she had an infection so we did convert her to a physician visit instead of just a nurse visit. Subsequently upon evaluation she does seem to have purulent discharge along with evidence of cellulitis extending up the leg which is definitely worse than when I saw her  earlier in the week. Fortunately there does not appear to be any evidence of systemic infection at this point. No fevers, chills, nausea, or vomiting noted at this time. 06/02/18 on evaluation today the patient appears to be doing better in regard to the overall appearance in regard to the wound bed. She still does have slough covering the surface of the wounds and due to pain really I do not think sharp debridement is going to be advisable at this point. Nonetheless I do feel like that she is definitely showing signs of improvement compared to her previous evaluation. Her pain level is still rather high she tells me at times 8-9/10. Unfortunately she is having a difficult time getting into pain management she states that the one that we referred her to stated it would be at least two weeks before they can get her in. 06/12/18 on evaluation today patient actually appears to be doing much better in regard to her lower extremity wounds. In fact there does not appear to be any evidence of infection at this time I do believe the Cipro has been effective her pain is also greatly improved compared to previous. Nonetheless she does have some Slough noted that is gonna require debridement. Patient's MRI was reviewed and revealed that she had no evidence of osteomyelitis, septic arthritis, or absence. She did have blisters noted on the surface of the foot which again visually we see today as well. 06/16/18 on evaluation today patient actually appears to be doing fairly well in regard to her lower extremity ulcerations. With that being said she does have issues at this point with discomfort although she did see the pain management physician this morning he did give her medication to help in this regard. She tells me that after I debrided her wound last week that she actually had significant pain to the point that she ended up having a difficult time even driving to get home. Obviously when she left the clinic she  was not in that much pain this somewhat surprises me especially since we put lidocaine on her wounds prior to applying the dressings and the dressing we were using was Prisma which is not notorious for causing discomfort and [name], [name] ([id number]) burning. Nonetheless she did obviously experience this and she tells me that she set intermittent spells where this occurred in the week since I last saw her. Nonetheless she has been given the pain medications necessary in order to keep things under control at this point. Obviously this is short term but nonetheless I think will be helpful in getting the wounds to heal so that we can appropriately debrided do what we need to do. 06/23/18 on evaluation today patient actually appears to be doing significantly better in regard to her right lower extremity ulcerations. In fact she has made dramatic improvement pretty much all locations which is great news. Fortunately there's no evidence of infection at this time. Her irritation and inflammation also is better than I do believe that the treatment utilizing the Santyl has been beneficial over the past week. I'm not even sure that we need that any further. 06/30/18 on evaluation today patient actually is doing worse compared to her previous evaluation. Unfortunately she's been having increased drainage, pain, and overall I feel like this is likely due to infection based on the appearance of her wound today. In general the patient seems to have digressed although not really back to where she started although definitely not as well she's been  doing in the past couple of weeks. She's not having green drainage like she did previous but she is having a lot of drainage to the point that she's actually dripping from the posterior portion of her leg at this time. 07/07/18 on evaluation today patient actually appears to be doing rather well in regard to her right lower extremity ulcers. They also do measuring smaller  which is good news she has a lot of dry and cracked skin which is gonna make her more prone to reinfection we need to work on this. Nonetheless I do believe that in general the patient has been doing well overall with her current dressing changes and I do believe the doxycycline was beneficial she did have a Staphylococcus aureus infection fortunately this was not MRSA. The doxycycline also is appropriate for this infection. 07/21/18 on evaluation today patient's wound bed actually shows evidence of good improvement. This is really in regard to all locations. There was some dried dressing/eschar covering several of the areas but fortunately she's not having the amount of pain that she previously was experiencing. No fevers, chills, nausea, or vomiting noted at this time. 08/04/18 on evaluation today patient appears to be doing very well in regard to her right lower extremity ulcers. She's been tolerating the dressing changes without complication. Fortunately there does not appear to be any evidence of infection at this time which is excellent news. Overall very pleased with the progress that has been made. The patient is having much less pain she's even been able to cut back a little bit of the pain medication she was taking roughly 3 a day on a regular basis she struck back to two and still seems to be doing well. Overall that is great news. 08/18/18 on evaluation today patient's lower extremity wounds actually both appear to be doing excellent at this time. She has been tolerating the dressing changes without complication. Overall I've been extremely pleased with the progress that she's made. The patient likewise is having much less pain and is very happy as well. 09/08/18 on evaluation today patient actually appears to be doing excellent in regard to her right lower extremity ulcers. She has been tolerating the dressing changes without complication which is great news. Overall I've been extremely  pleased with the progress that she has made. With that being said she does still continue to have some issue and it comes to getting this last small area to heal we've had some trouble in this regard. Nonetheless I do believe that she will do very well following a good debridement today. 09/22/18 on evaluation today patient's wound actually appears to be doing well. She does show signs of healing compared to previous. Fortunately there does not appear to be evidence of infection at this time which is good news. No fevers, chills, nausea, or vomiting noted at this time. 10/06/18 Seen today for follow up and management of right LE ulcer. Wound progressing well. No s/s of infection. Denies any recent fever, chills, or pain. 10/20/18 on evaluation today patient actually appears to be doing excellent in regard to her right lower extremity ulcer. She has been tolerating the dressing changes without complication. Overall I'm very pleased with how things appear at this point. 11/03/18 on evaluation today patient actually appears to be doing excellent in fact she is completely healed finally. We have been managing this for roughly half a year and she was having this issue prior to going to our clinic. With that being said  overall I feel like she is making excellent progress and has been up till today where this is in the healed. Electronic Signature(s) Signed: 11/05/2018 1:01:02 AM By: Corinne Ports, Oliviana [name] Kitchen ([id number]) Entered By: Lenda Kelp on 11/05/2018 00:20:18 Stevenson Clinch ([id number]) -------------------------------------------------------------------------------- Physical Exam Details Patient Name: Marcia [name] Date of Service: 11/03/2018 9:45 AM Medical Record Number: [id number] Patient Account Number: [id number] Date of Birth/Sex: 03-Oct-1946 (73 y.o. F) Treating RN: Curtis Sites Primary Care Provider: Sherrie Mustache Other Clinician: Referring Provider: Sherrie Mustache Treating Provider/Extender: Linwood Dibbles, Contrina Orona Weeks in Treatment: 25 Constitutional Well-nourished and well-hydrated in no acute distress. Respiratory normal breathing without difficulty. Psychiatric this patient is able to make decisions and demonstrates good insight into disease process. Alert and Oriented x 3. pleasant and cooperative. Notes Patient's wound shows complete epithelialization there's no sign of infection at this Electronic Signature(s) Signed: 11/05/2018 1:01:02 AM By: Lenda Kelp PA-C Entered By: Lenda Kelp on 11/05/2018 00:20:48 Stevenson Clinch ([id number]) -------------------------------------------------------------------------------- Physician Orders Details Patient Name: Marcia [name] Date of Service: 11/03/2018 9:45 AM Medical Record Number: [id number] Patient Account Number: [id number] Date of Birth/Sex: 1946-05-16 (73 y.o. 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 Date of Service: 11/03/2018 9:45  AM Medical Record Number: [id number] Patient Account Number: [id number] Date of Birth/Sex: 12-Sep-1946 (73 y.o. 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 05/12/2018 No Yes L97.212 Non-pressure chronic ulcer of right calf with fat layer exposed 05/12/2018 No Yes L97.312 Non-pressure chronic ulcer of right ankle with fat layer 05/12/2018 No Yes exposed E11.622 Type 2 diabetes mellitus with other skin ulcer 05/12/2018 No Yes Inactive Problems Resolved Problems Electronic Signature(s) Signed: 11/05/2018 1:01:02 AM By: Lenda Kelp PA-C Entered By: Lenda Kelp on 11/03/2018 10:03:10 [name], [name] Kitchen ([id number]) -------------------------------------------------------------------------------- Progress Note Details Patient Name: Marcia [name] Date of Service: 11/03/2018 9:45 AM Medical Record Number: [id number] Patient Account Number: [id number] Date of Birth/Sex: [birth year] (73 y.o. F) Treating RN: Curtis Sites Primary Care Provider: Sherrie Mustache Other Clinician: Referring Provider: Sherrie Mustache Treating Provider/Extender: Linwood Dibbles, Carlton Sweaney Weeks in Treatment: 25 Subjective Chief Complaint Information obtained from Patient RLE wounds History of Present Illness (HPI) 05/12/18-She is seen in initial evaluation for multiple open areas to the right lower extremity. She was recently hospitalized (7/30-8/2) with cellulitis of the right leg, failed outpatient therapy with Keflex. While hospitalized she was treated with IV vancomycin and cefepime; right foot x-ray was negative for osteomyelitis. She admits to intermittent pain, improved since hospitalization. She was discharged  on doxycycline which she should complete with the next 48  hours. 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 She has a remote history of following with vascular medicine for venous reflux, she does not tolerate wearing compression stockings d/t skin sensitivities and "burning" sensation. In office ABI are elevated but she has strongly palpable pulses. We will initiate hydrofera blue and ace wrap compression. She is a diabetic with A1c 6.9 while hospitalized. 05/19/18- She is seen in follow up evaluation for RLE wounds; the lateral area is healed. She did tolerate ace wrap for compression, but experienced increased xerosis. We will hold off on ace wrap and increase lachydrin to twice daily. She c/o pain to the medial cluster of wounds, there is no apparent infection and will initiate steroid cream. She will follow up next week 05/26/18 on evaluation today patient appears to actually be having some issues currently with pain. She has been utilizing the dressings as previously recommended for the lower extremities. With that being said she does appear to have a little bit of a rash she states that anytime it he's in for anything else touches her skin that she will often develop a rash. She's not even sure that should be able to tolerate a compression wrap due to the fact that again she would be concerned about even the cotton layer being in contact with her skin which overtime will cause a burning sensation. She has seen dermatology concerning this unfortunately they really did not have a good answer for her as to why this is occurring. Fortunately there is no evidence of infection at this time. 05/29/18 on evaluation today patient was actually supposed to be back in the office in order to have a dressings/wrap change is a nurse visit. With that being said she was having significant discomfort with want to touch around the wound and. It green discharge. Subsequently the  concern was that she had an infection so we did convert her to a physician visit instead of just a nurse visit. Subsequently upon evaluation she does seem to have purulent discharge along with evidence of cellulitis extending up the leg which is definitely worse than when I saw her earlier in the week. Fortunately there does not appear to be any evidence of systemic infection at this point. No fevers, chills, nausea, or vomiting noted at this time. 06/02/18 on evaluation today the patient appears to be doing better in regard to the overall appearance in regard to the wound bed. She still does have slough covering the surface of the wounds and due to pain really I do not think sharp debridement is going to be advisable at this point. Nonetheless I do feel like that she is definitely showing signs of improvement compared to her previous evaluation. Her pain level is still rather high she tells me at times 8-9/10. Unfortunately she is having a difficult time getting into pain management she states that the one that we referred her to stated it would be at least two weeks before they can get her in. 06/12/18 on evaluation today patient actually appears to be doing much better in regard to her lower extremity wounds. In fact there does not appear to be any evidence of infection at this time I do believe the Cipro has been effective her pain is also greatly improved compared to previous. Nonetheless she does have some Slough noted that is gonna require debridement.  Patient's MRI was reviewed and revealed that she had no evidence of osteomyelitis, septic arthritis, or absence. She did have blisters noted on the surface of the foot which again visually we see today as well. [name], [name] ([id number]) 06/16/18 on evaluation today patient actually appears to be doing fairly well in regard to her lower extremity ulcerations. With that being said she does have issues at this point with discomfort although she did  see the pain management physician this morning he did give her medication to help in this regard. She tells me that after I debrided her wound last week that she actually had significant pain to the point that she ended up having a difficult time even driving to get home. Obviously when she left the clinic she was not in that much pain this somewhat surprises me especially since we put lidocaine on her wounds prior to applying the dressings and the dressing we were using was Prisma which is not notorious for causing discomfort and burning. Nonetheless she did obviously experience this and she tells me that she set intermittent spells where this occurred in the week since I last saw her. Nonetheless she has been given the pain medications necessary in order to keep things under control at this point. Obviously this is short term but nonetheless I think will be helpful in getting the wounds to heal so that we can appropriately debrided do what we need to do. 06/23/18 on evaluation today patient actually appears to be doing significantly better in regard to her right lower extremity ulcerations. In fact she has made dramatic improvement pretty much all locations which is great news. Fortunately there's no evidence of infection at this time. Her irritation and inflammation also is better than I do believe that the treatment utilizing the Santyl has been beneficial over the past week. I'm not even sure that we need that any further. 06/30/18 on evaluation today patient actually is doing worse compared to her previous evaluation. Unfortunately she's been having increased drainage, pain, and overall I feel like this is likely due to infection based on the appearance of her wound today. In general the patient seems to have digressed although not really back to where she started although definitely not as well she's been doing in the past couple of weeks. She's not having green drainage like she did previous  but she is having a lot of drainage to the point that she's actually dripping from the posterior portion of her leg at this time. 07/07/18 on evaluation today patient actually appears to be doing rather well in regard to her right lower extremity ulcers. They also do measuring smaller which is good news she has a lot of dry and cracked skin which is gonna make her more prone to reinfection we need to work on this. Nonetheless I do believe that in general the patient has been doing well overall with her current dressing changes and I do believe the doxycycline was beneficial she did have a Staphylococcus aureus infection fortunately this was not MRSA. The doxycycline also is appropriate for this infection. 07/21/18 on evaluation today patient's wound bed actually shows evidence of good improvement. This is really in regard to all locations. There was some dried dressing/eschar covering several of the areas but fortunately she's not having the amount of pain that she previously was experiencing. No fevers, chills, nausea, or vomiting noted at this time. 08/04/18 on evaluation today patient appears to be doing very well in regard to  her right lower extremity ulcers. She's been tolerating the dressing changes without complication. Fortunately there does not appear to be any evidence of infection at this time which is excellent news. Overall very pleased with the progress that has been made. The patient is having much less pain she's even been able to cut back a little bit of the pain medication she was taking roughly 3 a day on a regular basis she struck back to two and still seems to be doing well. Overall that is great news. 08/18/18 on evaluation today patient's lower extremity wounds actually both appear to be doing excellent at this time. She has been tolerating the dressing changes without complication. Overall I've been extremely pleased with the progress that she's made. The patient likewise is  having much less pain and is very happy as well. 09/08/18 on evaluation today patient actually appears to be doing excellent in regard to her right lower extremity ulcers. She has been tolerating the dressing changes without complication which is great news. Overall I've been extremely pleased with the progress that she has made. With that being said she does still continue to have some issue and it comes to getting this last small area to heal we've had some trouble in this regard. Nonetheless I do believe that she will do very well following a good debridement today. 09/22/18 on evaluation today patient's wound actually appears to be doing well. She does show signs of healing compared to previous. Fortunately there does not appear to be evidence of infection at this time which is good news. No fevers, chills, nausea, or vomiting noted at this time. 10/06/18 Seen today for follow up and management of right LE ulcer. Wound progressing well. No s/s of infection. Denies any recent fever, chills, or pain. 10/20/18 on evaluation today patient actually appears to be doing excellent in regard to her right lower extremity ulcer. She has been tolerating the dressing changes without complication. Overall I'm very pleased with how things appear at this point. 11/03/18 on evaluation today patient actually appears to be doing excellent in fact she is completely healed finally. We have been managing this for roughly half a year and she was having this issue prior to going to our clinic. With that being said [facility]. ([id number]) overall I feel like she is making excellent progress and has been up till today where this is in the healed. Patient History Information obtained from Patient. Family History Cancer - Siblings, Diabetes - Mother,Father,Siblings, Heart Disease - Father, Hypertension - Father, Stroke - Father,Mother, No family history of Kidney Disease, Lung Disease, Seizures, Thyroid Problems,  Tuberculosis. Social History Former smoker - 10-15 years quit, Marital Status - Married, Alcohol Use - Never, Drug Use - No History, Caffeine Use - Daily. Medical History Eyes Denies history of Cataracts, Glaucoma, Optic Neuritis Ear/Nose/Mouth/Throat Denies history of Chronic sinus problems/congestion, Middle ear problems Hematologic/Lymphatic Denies history of Anemia, Hemophilia, Human Immunodeficiency Virus, Lymphedema, Sickle Cell Disease Respiratory Denies history of Aspiration, Asthma, Chronic Obstructive Pulmonary Disease (COPD), Pneumothorax, Sleep Apnea, Tuberculosis Cardiovascular Patient has history of Hypertension Denies history of Angina, Arrhythmia, Congestive Heart Failure, Coronary Artery Disease, Deep Vein Thrombosis, Hypotension, Myocardial Infarction, Peripheral Arterial Disease, Peripheral Venous Disease, Phlebitis, Vasculitis Gastrointestinal Denies history of Cirrhosis , Colitis, Crohn s, Hepatitis A, Hepatitis B, Hepatitis C Endocrine Patient has history of Type II Diabetes Genitourinary Denies history of End Stage Renal Disease Immunological Denies history of Lupus Erythematosus, Raynaud s, Scleroderma Integumentary (Skin) Denies history of  History of Burn, History of pressure wounds Musculoskeletal Denies history of Gout, Rheumatoid Arthritis, Osteoarthritis, Osteomyelitis Neurologic Patient has history of Neuropathy - peripheral Denies history of Dementia, Quadriplegia, Paraplegia, Seizure Disorder Oncologic Denies history of Received Chemotherapy, Received Radiation Psychiatric Denies history of Anorexia/bulimia, Confinement Anxiety Medical And Surgical History Notes Constitutional Symptoms (General Health) vitamin d deficiency Respiratory nodule of lung Cardiovascular dyspnea 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, AVV Musculoskeletal arthritis Review of Systems (ROS) Constitutional Symptoms (General Health) Denies complaints or symptoms  of Fever, Chills. [name], [name] [name] Kitchen ([id number]) Respiratory The patient has no complaints or symptoms. Cardiovascular The patient has no complaints or symptoms. Psychiatric The patient has no complaints or symptoms. Objective Constitutional Well-nourished and well-hydrated in no acute distress. Vitals Time Taken: 9:52 AM, Height: 70 in, Weight: 193 lbs, BMI: 27.7, Temperature: 97.7 F, Pulse: 69 bpm, Respiratory Rate: 16 breaths/min, Blood Pressure: 140/54 mmHg. Respiratory normal breathing without difficulty. Psychiatric this patient is able to make decisions and demonstrates good insight into disease process. Alert and Oriented x 3. pleasant and cooperative. General Notes: Patient's wound shows complete epithelialization there's no sign of infection at this Integumentary (Hair, Skin) Wound #5 status is Healed - Epithelialized. Original cause of wound was Gradually Appeared. The wound is located on the Right,Distal,Lateral Lower Leg. The wound measures 0cm length x 0cm width x 0cm depth; 0cm^2 area and 0cm^3 volume. There is Fat Layer (Subcutaneous Tissue) Exposed exposed. There is no tunneling or undermining noted. There is a none present amount of drainage noted. The wound margin is flat and intact. There is no granulation within the wound bed. There is no necrotic tissue within the wound bed. The periwound skin appearance exhibited: Hemosiderin Staining. The periwound skin appearance did not exhibit: Callus, Crepitus, Excoriation, Induration, Rash, Scarring, Dry/Scaly, Maceration, Atrophie Blanche, Cyanosis, Ecchymosis, Mottled, Pallor, Rubor, Erythema. Periwound temperature was noted as No Abnormality. The periwound has tenderness on palpation. 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 She notes that she has them at home and can do this. We will subsequently see her back for reevaluation as needed if anything changes in the future she's in agreement with that plan. Electronic Signature(s) Signed: 11/05/2018 1:01:02 AM By: Lenda Kelp PA-C Entered By: Lenda Kelp on 11/05/2018 00:20:59 Hope, [name] ([id number]) -------------------------------------------------------------------------------- ROS/PFSH Details Patient Name: Marcia [name] Date of Service: 11/03/2018 9:45 AM Medical Record Number: [id number] Patient Account Number: [id number] Date of Birth/Sex: 1946-03-30 (73 y.o. F) Treating RN: Curtis Sites Primary Care Provider: Sherrie Mustache Other Clinician: Referring Provider: Sherrie Mustache Treating Provider/Extender: Linwood Dibbles, Rhonna Holster Weeks in Treatment: 25 Information Obtained From Patient Wound History Do you currently have one or more open woundso Yes How many open wounds do you currently haveo 5 Approximately how long have you had your woundso 6 How have you been treating your wound(s) until nowo abx Has your wound(s) ever healed and then re-openedo No Have you had any lab work done in the past montho Yes Who ordered the lab work doneo hospital Have you  tested positive for an antibiotic resistant organism (MRSA, VRE)o No Have you tested positive for osteomyelitis (bone infection)o No Have you had any tests for circulation on your legso Yes Constitutional Symptoms (General Health) Complaints and Symptoms: Negative for: Fever; Chills Medical History: Past Medical History Notes: vitamin d deficiency Eyes Medical History: Negative for: Cataracts; Glaucoma;  Optic Neuritis Ear/Nose/Mouth/Throat Medical History: Negative for: Chronic sinus problems/congestion; Middle ear problems Hematologic/Lymphatic Medical History: Negative for: Anemia; Hemophilia; Human Immunodeficiency Virus; Lymphedema; Sickle Cell Disease Respiratory Complaints and Symptoms: No Complaints or Symptoms Medical History: Negative for: Aspiration; Asthma; Chronic Obstructive Pulmonary Disease (COPD); Pneumothorax; Sleep Apnea; Tuberculosis Past Medical History Notes: nodule of lung [name], [name] ([id number]) Cardiovascular Complaints and Symptoms: No Complaints or Symptoms Medical History: Positive for: Hypertension Negative for: Angina; Arrhythmia; Congestive Heart Failure; Coronary Artery Disease; Deep Vein Thrombosis; Hypotension; Myocardial Infarction; Peripheral Arterial Disease; Peripheral Venous Disease; Phlebitis; Vasculitis Past Medical History Notes: dyspnea 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, AVV Gastrointestinal Medical History: Negative for: Cirrhosis ; Colitis; Crohnos; Hepatitis A; Hepatitis B; Hepatitis C Endocrine Medical History: Positive for: Type II Diabetes Time with diabetes: 10 years Treated with: Oral agents Blood sugar tested every day: Yes Tested : every mornimg Genitourinary Medical History: Negative for: End Stage Renal Disease Immunological Medical History: Negative for: Lupus Erythematosus; Raynaudos; Scleroderma Integumentary (Skin) Medical History: Negative for: History of Burn; History of pressure wounds Musculoskeletal Medical History: Negative for: Gout; Rheumatoid Arthritis; Osteoarthritis; Osteomyelitis Past Medical History Notes: arthritis Neurologic Medical History: Positive for: Neuropathy - peripheral Negative for: Dementia; Quadriplegia; Paraplegia; Seizure Disorder Oncologic [name], [name] ([id number]) Medical History: Negative for: Received Chemotherapy; Received  Radiation Psychiatric Complaints and Symptoms: No Complaints or Symptoms Medical History: Negative for: Anorexia/bulimia; Confinement Anxiety Immunizations Pneumococcal Vaccine: Received Pneumococcal Vaccination: Yes Tetanus Vaccine: Last tetanus shot: 05/12/2014 Immunization Notes: plan on flu shot Implantable Devices Family and Social History Cancer: Yes - Siblings; Diabetes: Yes - Mother,Father,Siblings; Heart Disease: Yes - Father; Hypertension: Yes - Father; Kidney Disease: No; Lung Disease: No; Seizures: No; Stroke: Yes - Father,Mother; Thyroid Problems: No; Tuberculosis: No; Former smoker - 10-15 years quit; Marital Status - Married; Alcohol Use: Never; Drug Use: No History; Caffeine Use: Daily; Financial Concerns: No; Food, Clothing or Shelter Needs: No; Support System Lacking: No; Transportation Concerns: No; Advanced Directives: No; Patient does not want information on Advanced Directives; Living Will: No; Medical Power of Attorney: No Physician Affirmation I have reviewed and agree with the above information. Electronic Signature(s) Signed: 11/05/2018 1:01:02 AM By: Lenda Kelp PA-C Signed: 11/05/2018 5:03:44 PM By: Curtis Sites Entered By: Lenda Kelp on 11/05/2018 00:20:34 [name], [name] ([id number]) -------------------------------------------------------------------------------- SuperBill Details Patient Name: Marcia [name] Date of Service: 11/03/2018 Medical Record Number: [id number] Patient Account Number: [id number] Date of Birth/Sex: [birth year] (73 y.o. F) Treating RN: Curtis Sites Primary Care Provider: Sherrie Mustache Other Clinician: Referring Provider: Sherrie Mustache Treating Provider/Extender: Linwood Dibbles, Knut Rondinelli Weeks in Treatment: 25 Diagnosis Coding ICD-10 Codes Code Description 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 L97.212 Non-pressure chronic ulcer of right calf with fat layer exposed L97.312 Non-pressure chronic ulcer of right ankle with fat layer  exposed E11.622 Type 2 diabetes mellitus with other skin ulcer Facility Procedures CPT4 Code: [id number] Description: 99213 - WOUND CARE VISIT-LEV 3 EST PT Modifier: Quantity: 1 Physician Procedures CPT4 Code: 3845364 Description: 99214 - WC PHYS LEVEL 4 - EST PT ICD-10 Diagnosis Description 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

## 2019-04-07 DIAGNOSIS — L03115 Cellulitis of right lower limb: Secondary | ICD-10-CM | POA: Diagnosis not present

## 2019-04-07 DIAGNOSIS — L909 Atrophic disorder of skin, unspecified: Secondary | ICD-10-CM | POA: Insufficient documentation

## 2019-04-07 DIAGNOSIS — M5116 Intervertebral disc disorders with radiculopathy, lumbar region: Secondary | ICD-10-CM | POA: Diagnosis not present

## 2019-04-27 DIAGNOSIS — E1140 Type 2 diabetes mellitus with diabetic neuropathy, unspecified: Secondary | ICD-10-CM | POA: Diagnosis not present

## 2019-04-27 DIAGNOSIS — E538 Deficiency of other specified B group vitamins: Secondary | ICD-10-CM | POA: Diagnosis not present

## 2019-05-03 DIAGNOSIS — M79671 Pain in right foot: Secondary | ICD-10-CM | POA: Diagnosis not present

## 2019-05-03 DIAGNOSIS — G894 Chronic pain syndrome: Secondary | ICD-10-CM | POA: Diagnosis not present

## 2019-05-03 DIAGNOSIS — I10 Essential (primary) hypertension: Secondary | ICD-10-CM | POA: Diagnosis not present

## 2019-05-03 DIAGNOSIS — F1120 Opioid dependence, uncomplicated: Secondary | ICD-10-CM | POA: Diagnosis not present

## 2019-05-03 DIAGNOSIS — E0842 Diabetes mellitus due to underlying condition with diabetic polyneuropathy: Secondary | ICD-10-CM | POA: Diagnosis not present

## 2019-05-03 DIAGNOSIS — Z79899 Other long term (current) drug therapy: Secondary | ICD-10-CM | POA: Diagnosis not present

## 2019-05-03 DIAGNOSIS — E08621 Diabetes mellitus due to underlying condition with foot ulcer: Secondary | ICD-10-CM | POA: Diagnosis not present

## 2019-05-04 DIAGNOSIS — E782 Mixed hyperlipidemia: Secondary | ICD-10-CM | POA: Diagnosis not present

## 2019-05-04 DIAGNOSIS — E1140 Type 2 diabetes mellitus with diabetic neuropathy, unspecified: Secondary | ICD-10-CM | POA: Diagnosis not present

## 2019-05-04 DIAGNOSIS — E538 Deficiency of other specified B group vitamins: Secondary | ICD-10-CM | POA: Diagnosis not present

## 2019-05-31 DIAGNOSIS — I83013 Varicose veins of right lower extremity with ulcer of ankle: Secondary | ICD-10-CM | POA: Diagnosis not present

## 2019-05-31 DIAGNOSIS — I10 Essential (primary) hypertension: Secondary | ICD-10-CM | POA: Diagnosis not present

## 2019-05-31 DIAGNOSIS — M79671 Pain in right foot: Secondary | ICD-10-CM | POA: Diagnosis not present

## 2019-05-31 DIAGNOSIS — G894 Chronic pain syndrome: Secondary | ICD-10-CM | POA: Diagnosis not present

## 2019-05-31 DIAGNOSIS — E0842 Diabetes mellitus due to underlying condition with diabetic polyneuropathy: Secondary | ICD-10-CM | POA: Diagnosis not present

## 2019-05-31 DIAGNOSIS — L03115 Cellulitis of right lower limb: Secondary | ICD-10-CM | POA: Diagnosis not present

## 2019-05-31 DIAGNOSIS — L97312 Non-pressure chronic ulcer of right ankle with fat layer exposed: Secondary | ICD-10-CM | POA: Diagnosis not present

## 2019-05-31 DIAGNOSIS — E08621 Diabetes mellitus due to underlying condition with foot ulcer: Secondary | ICD-10-CM | POA: Diagnosis not present

## 2019-05-31 DIAGNOSIS — F1120 Opioid dependence, uncomplicated: Secondary | ICD-10-CM | POA: Diagnosis not present

## 2019-06-09 DIAGNOSIS — L97919 Non-pressure chronic ulcer of unspecified part of right lower leg with unspecified severity: Secondary | ICD-10-CM | POA: Diagnosis not present

## 2019-06-15 ENCOUNTER — Encounter (INDEPENDENT_AMBULATORY_CARE_PROVIDER_SITE_OTHER): Payer: Self-pay

## 2019-06-15 ENCOUNTER — Ambulatory Visit (INDEPENDENT_AMBULATORY_CARE_PROVIDER_SITE_OTHER): Payer: Medicare HMO | Admitting: Vascular Surgery

## 2019-06-15 ENCOUNTER — Encounter (INDEPENDENT_AMBULATORY_CARE_PROVIDER_SITE_OTHER): Payer: Self-pay | Admitting: Vascular Surgery

## 2019-06-15 ENCOUNTER — Other Ambulatory Visit: Payer: Self-pay

## 2019-06-15 VITALS — BP 168/72 | HR 72 | Resp 17 | Ht 70.0 in | Wt 190.0 lb

## 2019-06-15 DIAGNOSIS — M7989 Other specified soft tissue disorders: Secondary | ICD-10-CM

## 2019-06-15 DIAGNOSIS — E782 Mixed hyperlipidemia: Secondary | ICD-10-CM | POA: Diagnosis not present

## 2019-06-15 DIAGNOSIS — I872 Venous insufficiency (chronic) (peripheral): Secondary | ICD-10-CM | POA: Diagnosis not present

## 2019-06-15 DIAGNOSIS — L98499 Non-pressure chronic ulcer of skin of other sites with unspecified severity: Secondary | ICD-10-CM | POA: Diagnosis not present

## 2019-06-15 DIAGNOSIS — L97909 Non-pressure chronic ulcer of unspecified part of unspecified lower leg with unspecified severity: Secondary | ICD-10-CM

## 2019-06-15 DIAGNOSIS — L97919 Non-pressure chronic ulcer of unspecified part of right lower leg with unspecified severity: Secondary | ICD-10-CM

## 2019-06-15 DIAGNOSIS — E11622 Type 2 diabetes mellitus with other skin ulcer: Secondary | ICD-10-CM | POA: Diagnosis not present

## 2019-06-15 DIAGNOSIS — I10 Essential (primary) hypertension: Secondary | ICD-10-CM

## 2019-06-15 NOTE — Assessment & Plan Note (Signed)
Should be improved with Unna boot on the right.  May benefit from a compression stocking on the left.  Venous work-up as planned above.

## 2019-06-15 NOTE — Assessment & Plan Note (Signed)
blood glucose control important in reducing the progression of atherosclerotic disease. Also, involved in wound healing. On appropriate medications.  

## 2019-06-15 NOTE — Assessment & Plan Note (Signed)
lipid control important in reducing the progression of atherosclerotic disease. Continue statin therapy  

## 2019-06-15 NOTE — Assessment & Plan Note (Signed)
The patient has a nonhealing ulceration on the right lateral lower leg just above the ankle consistent with a typical venous stasis ulceration.  She has signs of venous insufficiency with varicose veins.  She has had a venous intervention almost a decade ago but no venous evaluation in many years.  We discussed this back in early 2018, but does not appear as if she followed up.  I would recommend placing her right leg in an Unna boot.  A 3 layer Unna boot was placed today and will be changed weekly.  I would recommend a venous reflux study to be done not only on the right leg but also in the left leg as she has swelling and varicosities there as well.  She has strongly palpable pedal pulses and I do not think this is arterial insufficiency although with multiple atherosclerotic risk factors of her wounds do not heal in short order we can check that as well.

## 2019-06-15 NOTE — Assessment & Plan Note (Signed)
blood pressure control important in reducing the progression of atherosclerotic disease. On appropriate oral medications.  

## 2019-06-15 NOTE — Progress Notes (Signed)
Patient ID: [name], female   DOB: 18-Dec-1945, 73 y.o.   MRN: [id number]  Chief Complaint  Patient presents with  . New Patient (Initial Visit)    Cellulitis of right leg    HPI [name] is a 73 y.o. female.  I am asked to see the patient by Dr. Dario Guardian for evaluation of leg swelling and ulceration. We saw her back in 2018 and discussed venous evaluation but she was lost to follow-up after this.  She had had a venous ablation done about a decade ago but she is not sure on which leg.  Her right leg gives her more trouble.  This 1 hurts more and has more swelling.  A few weeks ago, she developed an ulceration that is about the size of a dime with about the depth of a nickel on her right lower leg just above the lateral ankle.  She has had 3 episodes of cellulitis on this leg as well.  She has had prominent varicosities there for many years.  The ulcer hurts.  Otherwise, her leg just feels heavy and tired.  Her left leg swells some as well although the symptoms are not as severe as they are on the right.     Past Medical History:  Diagnosis Date  . Diabetes mellitus without complication (HCC)   . Hyperlipidemia   . Hypertension     Past Surgical History:  Procedure Laterality Date  . endovascular laser N/A     Family History Family History  Problem Relation Age of Onset  . Breast cancer Sister 75  . Breast cancer Maternal Aunt   No bleeding or clotting disorders No aneurysms  Social History Social History   Tobacco Use  . Smoking status: Former Games developer  . Smokeless tobacco: Never Used  Substance Use Topics  . Alcohol use: No  . Drug use: No    No Known Allergies  Current Outpatient Medications  Medication Sig Dispense Refill  . aspirin EC 81 MG tablet Take 81 mg by mouth daily.    . Cholecalciferol (VITAMIN D-3) 5000 units TABS Take 5,000 Units by mouth daily.    . cyanocobalamin (,VITAMIN B-12,) 1000 MCG/ML injection Inject into the muscle.    . etodolac  (LODINE) 400 MG tablet TAKE 1 TABLET BY MOUTH DAILY    . furosemide (LASIX) 20 MG tablet     . gabapentin (NEURONTIN) 300 MG capsule Take 300 mg by mouth 3 (three) times daily.     [name] Kitchen glimepiride (AMARYL) 4 MG tablet     . hydrochlorothiazide (HYDRODIURIL) 25 MG tablet Take 25 mg by mouth 2 (two) times daily.  1  . HYDROcodone-acetaminophen (NORCO/VICODIN) 5-325 MG tablet     . metFORMIN (GLUCOPHAGE) 1000 MG tablet     . metoprolol succinate (TOPROL-XL) 50 MG 24 hr tablet TAKE 1 TABLET ONCE A DAY ORALLY  0  . Multiple Vitamin (MULTI-VITAMIN) tablet Take by mouth.    . olmesartan (BENICAR) 40 MG tablet TAKE ONE TABLET BY MOUTH EVERY DAY    . omeprazole (PRILOSEC) 20 MG capsule Take by mouth.    . rosuvastatin (CRESTOR) 10 MG tablet Take 10 mg by mouth daily.    [name] Kitchen amLODipine (NORVASC) 10 MG tablet Take 10 mg by mouth daily.    . benazepril (LOTENSIN) 20 MG tablet Take 20 mg by mouth daily.  0  . celecoxib (CELEBREX) 200 MG capsule Take 200 mg by mouth daily.    [name] Kitchen glipiZIDE (GLUCOTROL  XL) 5 MG 24 hr tablet Take 5 mg by mouth 2 (two) times daily.  0  . hydrocortisone 2.5 % cream Apply topically 2 (two) times daily. (Patient not taking: Reported on 06/15/2019) 30 g 0  . KOMBIGLYZE XR 2.02-999 MG TB24 TAKE 1 TABLET 2 TIMES A DAY ORALLY  4   No current facility-administered medications for this visit.       REVIEW OF SYSTEMS (Negative unless checked)  Constitutional: [] Weight loss  [] Fever  [] Chills Cardiac: [] Chest pain   [] Chest pressure   [] Palpitations   [] Shortness of breath when laying flat   [] Shortness of breath at rest   [] Shortness of breath with exertion. Vascular:  [x] Pain in legs with walking   [x] Pain in legs at rest   [] Pain in legs when laying flat   [] Claudication   [] Pain in feet when walking  [] Pain in feet at rest  [] Pain in feet when laying flat   [] History of DVT   [] Phlebitis   [x] Swelling in legs   [] Varicose veins   [x] Non-healing ulcers Pulmonary:   [] Uses home oxygen    [] Productive cough   [] Hemoptysis   [] Wheeze  [] COPD   [] Asthma Neurologic:  [] Dizziness  [] Blackouts   [] Seizures   [] History of stroke   [] History of TIA  [] Aphasia   [] Temporary blindness   [] Dysphagia   [] Weakness or numbness in arms   [] Weakness or numbness in legs Musculoskeletal:  [] Arthritis   [] Joint swelling   [] Joint pain   [] Low back pain Hematologic:  [] Easy bruising  [] Easy bleeding   [] Hypercoagulable state   [] Anemic  [] Hepatitis Gastrointestinal:  [] Blood in stool   [] Vomiting blood  [] Gastroesophageal reflux/heartburn   [] Abdominal pain Genitourinary:  [] Chronic kidney disease   [] Difficult urination  [] Frequent urination  [] Burning with urination   [] Hematuria Skin:  [] Rashes   [x] Ulcers   [x] Wounds Psychological:  [] History of anxiety   []  History of major depression.    Physical Exam BP (!) 168/72 (BP Location: Right Arm)   Pulse 72   Resp 17   Ht 5\' 10"  (1.778 m)   Wt 190 lb (86.2 kg)   BMI 27.26 kg/m  Gen:  WD/WN, NAD Head: [place]/AT, No temporalis wasting.  Ear/Nose/Throat: Hearing grossly intact, nares w/o erythema or drainage, oropharynx w/o Erythema/Exudate Eyes: Conjunctiva clear, sclera non-icteric  Neck: trachea midline.  No JVD.  Pulmonary:  Good air movement, respirations not labored, no use of accessory muscles  Cardiac: RRR, no JVD Vascular:  Vessel Right Left  Radial Palpable Palpable                          DP 2+ 2+  PT 2+ 2+   Gastrointestinal:. No masses, surgical incisions, or scars. Musculoskeletal: M/S 5/5 throughout.  Extremities without ischemic changes.  No deformity or atrophy.  Diffuse varicosities bilaterally a little more on the right than the left.  1+ bilateral lower extremity edema. Neurologic: Sensation grossly intact in extremities.  Symmetrical.  Speech is fluent. Motor exam as listed above. Psychiatric: Judgment intact, Mood & affect appropriate for pt's clinical situation. Dermatologic: No rashes or ulcers noted.  No  cellulitis or open wounds.    Radiology No results found.  Labs No results found for this or any previous visit (from the past 2160 hour(s)).  Assessment/Plan:  Swelling of limb Should be improved with Unna boot on the right.  May benefit from a compression stocking on the left.  Venous  work-up as planned above.  Essential hypertension, benign blood pressure control important in reducing the progression of atherosclerotic disease. On appropriate oral medications.   Diabetes (HCC) blood glucose control important in reducing the progression of atherosclerotic disease. Also, involved in wound healing. On appropriate medications.   Hyperlipidemia, mixed lipid control important in reducing the progression of atherosclerotic disease. Continue statin therapy  Venous ulcer of lower leg without varicose veins (HCC) The patient has a nonhealing ulceration on the right lateral lower leg just above the ankle consistent with a typical venous stasis ulceration.  She has signs of venous insufficiency with varicose veins.  She has had a venous intervention almost a decade ago but no venous evaluation in many years.  We discussed this back in early 2018, but does not appear as if she followed up.  I would recommend placing her right leg in an Unna boot.  A 3 layer Unna boot was placed today and will be changed weekly.  I would recommend a venous reflux study to be done not only on the right leg but also in the left leg as she has swelling and varicosities there as well.  She has strongly palpable pedal pulses and I do not think this is arterial insufficiency although with multiple atherosclerotic risk factors of her wounds do not heal in short order we can check that as well.      Festus BarrenJason  06/15/2019, 12:18 PM   This note was created with Dragon medical transcription system.  Any errors from dictation are unintentional.

## 2019-06-15 NOTE — Patient Instructions (Signed)

## 2019-06-22 ENCOUNTER — Ambulatory Visit (INDEPENDENT_AMBULATORY_CARE_PROVIDER_SITE_OTHER): Payer: Medicare HMO | Admitting: Nurse Practitioner

## 2019-06-22 ENCOUNTER — Encounter (INDEPENDENT_AMBULATORY_CARE_PROVIDER_SITE_OTHER): Payer: Self-pay | Admitting: Nurse Practitioner

## 2019-06-22 ENCOUNTER — Other Ambulatory Visit: Payer: Self-pay

## 2019-06-22 VITALS — BP 137/83 | HR 74 | Resp 12 | Ht 70.0 in | Wt 191.0 lb

## 2019-06-22 DIAGNOSIS — L97919 Non-pressure chronic ulcer of unspecified part of right lower leg with unspecified severity: Secondary | ICD-10-CM | POA: Diagnosis not present

## 2019-06-22 NOTE — Progress Notes (Signed)
History of Present Illness  There is no documented history at this time  Assessments & Plan   There are no diagnoses linked to this encounter.    Additional instructions  Subjective:  Patient presents with venous ulcer of the Right lower extremity.    Procedure:  3 layer unna wrap was placed Right lower extremity.   Plan:   Follow up in one week.   

## 2019-06-29 ENCOUNTER — Encounter (INDEPENDENT_AMBULATORY_CARE_PROVIDER_SITE_OTHER): Payer: Self-pay | Admitting: Nurse Practitioner

## 2019-06-29 ENCOUNTER — Other Ambulatory Visit: Payer: Self-pay

## 2019-06-29 ENCOUNTER — Ambulatory Visit (INDEPENDENT_AMBULATORY_CARE_PROVIDER_SITE_OTHER): Payer: Medicare HMO | Admitting: Nurse Practitioner

## 2019-06-29 VITALS — BP 138/64 | HR 72

## 2019-06-29 DIAGNOSIS — L97919 Non-pressure chronic ulcer of unspecified part of right lower leg with unspecified severity: Secondary | ICD-10-CM | POA: Diagnosis not present

## 2019-06-29 NOTE — Progress Notes (Signed)
..  History of Present Illness  There is no documented history at this time  Assessments & Plan   There are no diagnoses linked to this encounter.    Additional instructions  Subjective:  Patient presents with venous ulcer of the Right lower extremity.    Procedure:  3 layer unna wrap was placed Right lower extremity.   Plan:   Follow up in one week.  Patient wound was weeping through the 3 layer unna. The color of the tissue in the hole is looking more of a yellow color. I spoke with Dr. Lucky Cowboy and he advised to use Aquacel AG on the area and re-wrap and then have patient come in 2 times weekly for unna wraps using the Aquacel. AS, CMA

## 2019-06-30 DIAGNOSIS — I10 Essential (primary) hypertension: Secondary | ICD-10-CM | POA: Diagnosis not present

## 2019-06-30 DIAGNOSIS — E08621 Diabetes mellitus due to underlying condition with foot ulcer: Secondary | ICD-10-CM | POA: Diagnosis not present

## 2019-06-30 DIAGNOSIS — F1120 Opioid dependence, uncomplicated: Secondary | ICD-10-CM | POA: Diagnosis not present

## 2019-06-30 DIAGNOSIS — M79671 Pain in right foot: Secondary | ICD-10-CM | POA: Diagnosis not present

## 2019-06-30 DIAGNOSIS — G894 Chronic pain syndrome: Secondary | ICD-10-CM | POA: Diagnosis not present

## 2019-06-30 DIAGNOSIS — E0842 Diabetes mellitus due to underlying condition with diabetic polyneuropathy: Secondary | ICD-10-CM | POA: Diagnosis not present

## 2019-06-30 DIAGNOSIS — Z79899 Other long term (current) drug therapy: Secondary | ICD-10-CM | POA: Diagnosis not present

## 2019-07-01 ENCOUNTER — Ambulatory Visit (INDEPENDENT_AMBULATORY_CARE_PROVIDER_SITE_OTHER): Payer: Medicare HMO | Admitting: Nurse Practitioner

## 2019-07-01 ENCOUNTER — Other Ambulatory Visit: Payer: Self-pay

## 2019-07-01 VITALS — BP 158/74 | HR 76 | Resp 10 | Ht 70.0 in | Wt 190.0 lb

## 2019-07-01 DIAGNOSIS — I872 Venous insufficiency (chronic) (peripheral): Secondary | ICD-10-CM

## 2019-07-01 DIAGNOSIS — L97909 Non-pressure chronic ulcer of unspecified part of unspecified lower leg with unspecified severity: Secondary | ICD-10-CM

## 2019-07-01 DIAGNOSIS — L97919 Non-pressure chronic ulcer of unspecified part of right lower leg with unspecified severity: Secondary | ICD-10-CM

## 2019-07-02 NOTE — Progress Notes (Signed)
..  History of Present Illness  There is no documented history at this time  Assessments & Plan   There are no diagnoses linked to this encounter.    Additional instructions  Subjective:  Patient presents with venous ulcer of the Right lower extremity.    Procedure:  3 layer unna wrap was placed Right lower extremity.   Plan:   Follow up in one week.   Patient has wound on right ankle. I placed a piece of Aquacel over the wound then applied a 3 layer unna wrap. AS, CMA

## 2019-07-05 ENCOUNTER — Encounter (INDEPENDENT_AMBULATORY_CARE_PROVIDER_SITE_OTHER): Payer: Self-pay | Admitting: Nurse Practitioner

## 2019-07-06 ENCOUNTER — Ambulatory Visit (INDEPENDENT_AMBULATORY_CARE_PROVIDER_SITE_OTHER): Payer: Medicare HMO | Admitting: Nurse Practitioner

## 2019-07-06 ENCOUNTER — Other Ambulatory Visit: Payer: Self-pay

## 2019-07-06 VITALS — BP 122/70 | HR 74 | Resp 10 | Ht 70.0 in | Wt 187.0 lb

## 2019-07-06 DIAGNOSIS — L97919 Non-pressure chronic ulcer of unspecified part of right lower leg with unspecified severity: Secondary | ICD-10-CM

## 2019-07-06 DIAGNOSIS — L97909 Non-pressure chronic ulcer of unspecified part of unspecified lower leg with unspecified severity: Secondary | ICD-10-CM | POA: Diagnosis not present

## 2019-07-06 DIAGNOSIS — I872 Venous insufficiency (chronic) (peripheral): Secondary | ICD-10-CM | POA: Diagnosis not present

## 2019-07-06 NOTE — Progress Notes (Signed)
..  History of Present Illness  There is no documented history at this time  Assessments & Plan   There are no diagnoses linked to this encounter.    Additional instructions  Subjective:  Patient presents with venous ulcer of the Right lower extremity.    Procedure:  3 layer unna wrap was placed Right lower extremity.   Plan:   Follow up in one week.     Small piece of Aquacel placed over wound on leg per Dew. AS, CMA

## 2019-07-08 ENCOUNTER — Other Ambulatory Visit: Payer: Self-pay

## 2019-07-08 ENCOUNTER — Encounter (INDEPENDENT_AMBULATORY_CARE_PROVIDER_SITE_OTHER): Payer: Self-pay | Admitting: Nurse Practitioner

## 2019-07-08 ENCOUNTER — Ambulatory Visit (INDEPENDENT_AMBULATORY_CARE_PROVIDER_SITE_OTHER): Payer: Medicare HMO | Admitting: Nurse Practitioner

## 2019-07-08 VITALS — BP 122/68 | HR 76 | Resp 17 | Ht 70.0 in | Wt 188.0 lb

## 2019-07-08 DIAGNOSIS — L97919 Non-pressure chronic ulcer of unspecified part of right lower leg with unspecified severity: Secondary | ICD-10-CM | POA: Diagnosis not present

## 2019-07-08 DIAGNOSIS — I872 Venous insufficiency (chronic) (peripheral): Secondary | ICD-10-CM | POA: Diagnosis not present

## 2019-07-08 DIAGNOSIS — L97909 Non-pressure chronic ulcer of unspecified part of unspecified lower leg with unspecified severity: Secondary | ICD-10-CM

## 2019-07-08 NOTE — Progress Notes (Signed)
History of Present Illness  There is no documented history at this time  Assessments & Plan   There are no diagnoses linked to this encounter.    Additional instructions  Subjective:  Patient presents with venous ulcer of the Right lower extremity.    Procedure:  3 layer unna wrap was placed Right lower extremity.   Plan:   Follow up in one week.   

## 2019-07-13 ENCOUNTER — Other Ambulatory Visit: Payer: Self-pay

## 2019-07-13 ENCOUNTER — Encounter (INDEPENDENT_AMBULATORY_CARE_PROVIDER_SITE_OTHER): Payer: Self-pay | Admitting: Vascular Surgery

## 2019-07-13 ENCOUNTER — Ambulatory Visit (INDEPENDENT_AMBULATORY_CARE_PROVIDER_SITE_OTHER): Payer: Medicare HMO | Admitting: Vascular Surgery

## 2019-07-13 ENCOUNTER — Ambulatory Visit (INDEPENDENT_AMBULATORY_CARE_PROVIDER_SITE_OTHER): Payer: Medicare HMO

## 2019-07-13 VITALS — BP 156/69 | HR 69 | Resp 16 | Ht 70.0 in | Wt 190.0 lb

## 2019-07-13 DIAGNOSIS — I10 Essential (primary) hypertension: Secondary | ICD-10-CM

## 2019-07-13 DIAGNOSIS — I872 Venous insufficiency (chronic) (peripheral): Secondary | ICD-10-CM

## 2019-07-13 DIAGNOSIS — L97919 Non-pressure chronic ulcer of unspecified part of right lower leg with unspecified severity: Secondary | ICD-10-CM | POA: Diagnosis not present

## 2019-07-13 DIAGNOSIS — E11622 Type 2 diabetes mellitus with other skin ulcer: Secondary | ICD-10-CM

## 2019-07-13 DIAGNOSIS — L97909 Non-pressure chronic ulcer of unspecified part of unspecified lower leg with unspecified severity: Secondary | ICD-10-CM | POA: Diagnosis not present

## 2019-07-13 DIAGNOSIS — E782 Mixed hyperlipidemia: Secondary | ICD-10-CM

## 2019-07-13 DIAGNOSIS — L98499 Non-pressure chronic ulcer of skin of other sites with unspecified severity: Secondary | ICD-10-CM

## 2019-07-13 NOTE — Assessment & Plan Note (Signed)
Her duplex today shows no evidence of DVT or superficial thrombophlebitis.  Her right great saphenous vein is absent.  An accessory vein may be seen but no significant superficial reflux is identified with only common femoral vein reflux seen. The wound is slowly improving but does seem to be getting better.  At this point, we will continue Unna boots on a weekly basis and recheck her in 3 to 4 weeks.  A 3 layer Unna boot was placed on the right leg today.

## 2019-07-13 NOTE — Progress Notes (Signed)
MRN : [id number]  [name] is a 73 y.o. (1945/11/24) female who presents with chief complaint of  Chief Complaint  Patient presents with  . Follow-up    ultrasound  .  History of Present Illness: Patient returns today in follow up of her ulceration on the right lateral lower leg.  This continues to weep and drain but is a bit smaller today in size.  It is smaller than a dime.  It still has a little bit of depth to it.  The ulcer is clean and does appear to be improving.  It remains painful.  Her duplex today shows no evidence of DVT or superficial thrombophlebitis.  Her right great saphenous vein is absent.  An accessory vein may be seen but no significant superficial reflux is identified with only common femoral vein reflux seen.  Current Outpatient Medications  Medication Sig Dispense Refill  . amLODipine (NORVASC) 10 MG tablet Take 10 mg by mouth daily.    [name] Kitchen aspirin EC 81 MG tablet Take 81 mg by mouth daily.    . benazepril (LOTENSIN) 20 MG tablet Take 20 mg by mouth daily.  0  . etodolac (LODINE) 400 MG tablet TAKE 1 TABLET BY MOUTH DAILY    . furosemide (LASIX) 20 MG tablet     . gabapentin (NEURONTIN) 300 MG capsule Take 300 mg by mouth 3 (three) times daily.     [name] Kitchen glimepiride (AMARYL) 4 MG tablet     . hydrochlorothiazide (HYDRODIURIL) 25 MG tablet Take 25 mg by mouth 2 (two) times daily.  1  . HYDROcodone-acetaminophen (NORCO/VICODIN) 5-325 MG tablet     . hydrocortisone 2.5 % cream Apply topically 2 (two) times daily. 30 g 0  . metFORMIN (GLUCOPHAGE) 1000 MG tablet     . metoprolol succinate (TOPROL-XL) 50 MG 24 hr tablet TAKE 1 TABLET ONCE A DAY ORALLY  0  . Multiple Vitamin (MULTI-VITAMIN) tablet Take by mouth.    . olmesartan (BENICAR) 40 MG tablet TAKE ONE TABLET BY MOUTH EVERY DAY    . omeprazole (PRILOSEC) 20 MG capsule Take by mouth.    . rosuvastatin (CRESTOR) 10 MG tablet Take 10 mg by mouth daily.    . celecoxib (CELEBREX) 200 MG capsule Take 200 mg by mouth  daily.    . Cholecalciferol (VITAMIN D-3) 5000 units TABS Take 5,000 Units by mouth daily.    . cyanocobalamin (,VITAMIN B-12,) 1000 MCG/ML injection Inject into the muscle.    [name] Kitchen glipiZIDE (GLUCOTROL XL) 5 MG 24 hr tablet Take 5 mg by mouth 2 (two) times daily.  0  . KOMBIGLYZE XR 2.02-999 MG TB24 TAKE 1 TABLET 2 TIMES A DAY ORALLY  4   No current facility-administered medications for this visit.     Past Medical History:  Diagnosis Date  . Diabetes mellitus without complication (HCC)   . Hyperlipidemia   . Hypertension     Past Surgical History:  Procedure Laterality Date  . endovascular laser N/A     Social History Social History   Tobacco Use  . Smoking status: Former Games developer  . Smokeless tobacco: Never Used  Substance Use Topics  . Alcohol use: No  . Drug use: No    Family History Family History  Problem Relation Age of Onset  . Breast cancer Sister 10  . Breast cancer Maternal Aunt   no bleeding or clotting disorders  No Known Allergies  REVIEW OF SYSTEMS (Negative unless checked)  Constitutional: [] ?Weight loss  [] ?  Fever  [] ?Chills Cardiac: [] ?Chest pain   [] ?Chest pressure   [] ?Palpitations   [] ?Shortness of breath when laying flat   [] ?Shortness of breath at rest   [] ?Shortness of breath with exertion. Vascular:  [x] ?Pain in legs with walking   [x] ?Pain in legs at rest   [] ?Pain in legs when laying flat   [] ?Claudication   [] ?Pain in feet when walking  [] ?Pain in feet at rest  [] ?Pain in feet when laying flat   [] ?History of DVT   [] ?Phlebitis   [x] ?Swelling in legs   [] ?Varicose veins   [x] ?Non-healing ulcers Pulmonary:   [] ?Uses home oxygen   [] ?Productive cough   [] ?Hemoptysis   [] ?Wheeze  [] ?COPD   [] ?Asthma Neurologic:  [] ?Dizziness  [] ?Blackouts   [] ?Seizures   [] ?History of stroke   [] ?History of TIA  [] ?Aphasia   [] ?Temporary blindness   [] ?Dysphagia   [] ?Weakness or numbness in arms   [] ?Weakness or numbness in legs Musculoskeletal:  [] ?Arthritis    [] ?Joint swelling   [] ?Joint pain   [] ?Low back pain Hematologic:  [] ?Easy bruising  [] ?Easy bleeding   [] ?Hypercoagulable state   [] ?Anemic  [] ?Hepatitis Gastrointestinal:  [] ?Blood in stool   [] ?Vomiting blood  [] ?Gastroesophageal reflux/heartburn   [] ?Abdominal pain Genitourinary:  [] ?Chronic kidney disease   [] ?Difficult urination  [] ?Frequent urination  [] ?Burning with urination   [] ?Hematuria Skin:  [] ?Rashes   [x] ?Ulcers   [x] ?Wounds Psychological:  [] ?History of anxiety   [] ? History of major depression.    Physical Examination  BP (!) 156/69 (BP Location: Right Arm)   Pulse 69   Resp 16   Ht 5\' 10"  (1.778 m)   Wt 190 lb (86.2 kg)   BMI 27.26 kg/m  Gen:  WD/WN, NAD Head: [place]/AT, No temporalis wasting. Ear/Nose/Throat: Hearing grossly intact, nares w/o erythema or drainage Eyes: Conjunctiva clear. Sclera non-icteric Neck: Supple.  Trachea midline Pulmonary:  Good air movement, no use of accessory muscles.  Cardiac: RRR, no JVD Vascular:  Vessel Right Left  Radial Palpable Palpable                           Musculoskeletal: M/S 5/5 throughout.  No deformity or atrophy.  Right lower leg ulcer on the lateral size smaller than a dime and appearing clean.  1+ right lower extremity edema. Neurologic: Sensation grossly intact in extremities.  Symmetrical.  Speech is fluent.  Psychiatric: Judgment intact, Mood & affect appropriate for pt's clinical situation. Dermatologic: Right leg wound as above       Labs No results found for this or any previous visit (from the past 2160 hour(s)).  Radiology No results found.  Assessment/Plan Essential hypertension, benign blood pressure control important in reducing the progression of atherosclerotic disease. On appropriate oral medications.   Diabetes ([place]) blood glucose control important in reducing the progression of atherosclerotic disease. Also, involved in wound healing. On appropriate medications.    Hyperlipidemia, mixed lipid control important in reducing the progression of atherosclerotic disease. Continue statin therapy  Venous ulcer of lower leg without varicose veins (HCC) Her duplex today shows no evidence of DVT or superficial thrombophlebitis.  Her right great saphenous vein is absent.  An accessory vein may be seen but no significant superficial reflux is identified with only common femoral vein reflux seen. The wound is slowly improving but does seem to be getting better.  At this point, we will continue Unna boots on a weekly basis and recheck her in 3 to  4 weeks.  A 3 layer Unna boot was placed on the right leg today.    Festus Barren, MD  07/13/2019 11:45 AM    This note was created with Dragon medical transcription system.  Any errors from dictation are purely unintentional

## 2019-07-21 ENCOUNTER — Ambulatory Visit (INDEPENDENT_AMBULATORY_CARE_PROVIDER_SITE_OTHER): Payer: Medicare HMO | Admitting: Nurse Practitioner

## 2019-07-21 ENCOUNTER — Other Ambulatory Visit: Payer: Self-pay

## 2019-07-21 VITALS — BP 113/66 | HR 75 | Resp 10 | Wt 191.0 lb

## 2019-07-21 DIAGNOSIS — I872 Venous insufficiency (chronic) (peripheral): Secondary | ICD-10-CM | POA: Diagnosis not present

## 2019-07-21 DIAGNOSIS — L97919 Non-pressure chronic ulcer of unspecified part of right lower leg with unspecified severity: Secondary | ICD-10-CM | POA: Diagnosis not present

## 2019-07-21 DIAGNOSIS — L97909 Non-pressure chronic ulcer of unspecified part of unspecified lower leg with unspecified severity: Secondary | ICD-10-CM | POA: Diagnosis not present

## 2019-07-21 NOTE — Progress Notes (Signed)
History of Present Illness  There is no documented history at this time  Assessments & Plan   There are no diagnoses linked to this encounter.    Additional instructions  Subjective:  Patient presents with venous ulcer of the Right lower extremity.    Procedure:  3 layer unna wrap was placed Right lower extremity.   Plan:   Follow up in one week.   

## 2019-07-23 ENCOUNTER — Encounter (INDEPENDENT_AMBULATORY_CARE_PROVIDER_SITE_OTHER): Payer: Self-pay | Admitting: Nurse Practitioner

## 2019-07-26 ENCOUNTER — Encounter (INDEPENDENT_AMBULATORY_CARE_PROVIDER_SITE_OTHER): Payer: Self-pay

## 2019-07-26 ENCOUNTER — Other Ambulatory Visit: Payer: Self-pay

## 2019-07-26 ENCOUNTER — Ambulatory Visit (INDEPENDENT_AMBULATORY_CARE_PROVIDER_SITE_OTHER): Payer: Medicare HMO | Admitting: Vascular Surgery

## 2019-07-26 VITALS — BP 174/71 | HR 67 | Resp 16 | Wt 191.0 lb

## 2019-07-26 DIAGNOSIS — L97909 Non-pressure chronic ulcer of unspecified part of unspecified lower leg with unspecified severity: Secondary | ICD-10-CM | POA: Diagnosis not present

## 2019-07-26 DIAGNOSIS — L97919 Non-pressure chronic ulcer of unspecified part of right lower leg with unspecified severity: Secondary | ICD-10-CM

## 2019-07-26 DIAGNOSIS — I872 Venous insufficiency (chronic) (peripheral): Secondary | ICD-10-CM | POA: Diagnosis not present

## 2019-07-26 NOTE — Progress Notes (Signed)
History of Present Illness  There is no documented history at this time  Assessments & Plan   There are no diagnoses linked to this encounter.    Additional instructions  Subjective:  Patient presents with venous ulcer of the Right lower extremity.    Procedure:  3 layer unna wrap was placed Right lower extremity.   Plan:   Follow up in one week.   

## 2019-07-28 ENCOUNTER — Encounter (INDEPENDENT_AMBULATORY_CARE_PROVIDER_SITE_OTHER): Payer: Medicare HMO

## 2019-07-29 ENCOUNTER — Ambulatory Visit (INDEPENDENT_AMBULATORY_CARE_PROVIDER_SITE_OTHER): Payer: Medicare HMO | Admitting: Nurse Practitioner

## 2019-07-29 ENCOUNTER — Other Ambulatory Visit: Payer: Self-pay

## 2019-07-29 ENCOUNTER — Encounter (INDEPENDENT_AMBULATORY_CARE_PROVIDER_SITE_OTHER): Payer: Self-pay | Admitting: Nurse Practitioner

## 2019-07-29 VITALS — BP 179/74 | HR 74 | Resp 12 | Ht 70.0 in | Wt 191.0 lb

## 2019-07-29 DIAGNOSIS — L97919 Non-pressure chronic ulcer of unspecified part of right lower leg with unspecified severity: Secondary | ICD-10-CM

## 2019-07-29 DIAGNOSIS — I872 Venous insufficiency (chronic) (peripheral): Secondary | ICD-10-CM | POA: Diagnosis not present

## 2019-07-29 DIAGNOSIS — L97909 Non-pressure chronic ulcer of unspecified part of unspecified lower leg with unspecified severity: Secondary | ICD-10-CM | POA: Diagnosis not present

## 2019-07-29 NOTE — Progress Notes (Signed)
..  History of Present Illness  There is no documented history at this time  Assessments & Plan   There are no diagnoses linked to this encounter.    Additional instructions  Subjective:  Patient presents with venous ulcer of the Right lower extremity.    Procedure:  3 layer unna wrap was placed Right lower extremity.   Plan:   Follow up in one week.   Unna wrap placed with aquacel over wound per Arna Medici. AS, CMA

## 2019-08-03 ENCOUNTER — Ambulatory Visit (INDEPENDENT_AMBULATORY_CARE_PROVIDER_SITE_OTHER): Payer: Medicare HMO | Admitting: Vascular Surgery

## 2019-08-03 ENCOUNTER — Encounter (INDEPENDENT_AMBULATORY_CARE_PROVIDER_SITE_OTHER): Payer: Self-pay | Admitting: Vascular Surgery

## 2019-08-03 ENCOUNTER — Other Ambulatory Visit: Payer: Self-pay

## 2019-08-03 VITALS — BP 147/70 | HR 69 | Resp 16 | Wt 191.0 lb

## 2019-08-03 DIAGNOSIS — E782 Mixed hyperlipidemia: Secondary | ICD-10-CM | POA: Diagnosis not present

## 2019-08-03 DIAGNOSIS — E11622 Type 2 diabetes mellitus with other skin ulcer: Secondary | ICD-10-CM | POA: Diagnosis not present

## 2019-08-03 DIAGNOSIS — I872 Venous insufficiency (chronic) (peripheral): Secondary | ICD-10-CM | POA: Diagnosis not present

## 2019-08-03 DIAGNOSIS — L98499 Non-pressure chronic ulcer of skin of other sites with unspecified severity: Secondary | ICD-10-CM | POA: Diagnosis not present

## 2019-08-03 DIAGNOSIS — L97909 Non-pressure chronic ulcer of unspecified part of unspecified lower leg with unspecified severity: Secondary | ICD-10-CM

## 2019-08-03 DIAGNOSIS — I10 Essential (primary) hypertension: Secondary | ICD-10-CM

## 2019-08-03 NOTE — Progress Notes (Signed)
MRN : [id number]  [name] is a 73 y.o. (11/20/1945) female who presents with chief complaint of  Chief Complaint  Patient presents with  . Follow-up    unna check  .  History of Present Illness: Patient returns today in follow up of her venous insufficiency and right lateral ankle ulceration.  The ankle ulceration is significantly better.  There is no longer depth to the wound.  It is clean and granulating and less than the size of a dime at this point.  Her leg swelling is much better.  She does have an area of pain overlying a varicosity in the left posterior calf.  Current Outpatient Medications  Medication Sig Dispense Refill  . aspirin EC 81 MG tablet Take 81 mg by mouth daily.    . Cholecalciferol (VITAMIN D-3) 5000 units TABS Take 5,000 Units by mouth daily.    . cyanocobalamin (,VITAMIN B-12,) 1000 MCG/ML injection Inject into the muscle.    . etodolac (LODINE) 400 MG tablet TAKE 1 TABLET BY MOUTH DAILY    . furosemide (LASIX) 20 MG tablet     . gabapentin (NEURONTIN) 300 MG capsule Take 300 mg by mouth 3 (three) times daily.     [name] Kitchen glimepiride (AMARYL) 4 MG tablet     . hydrochlorothiazide (HYDRODIURIL) 25 MG tablet Take 25 mg by mouth 2 (two) times daily.  1  . HYDROcodone-acetaminophen (NORCO/VICODIN) 5-325 MG tablet     . hydrocortisone 2.5 % cream Apply topically 2 (two) times daily. 30 g 0  . metFORMIN (GLUCOPHAGE) 1000 MG tablet     . metoprolol succinate (TOPROL-XL) 50 MG 24 hr tablet TAKE 1 TABLET ONCE A DAY ORALLY  0  . Multiple Vitamin (MULTI-VITAMIN) tablet Take by mouth.    . olmesartan (BENICAR) 40 MG tablet TAKE ONE TABLET BY MOUTH EVERY DAY    . omeprazole (PRILOSEC) 20 MG capsule Take by mouth.    . rosuvastatin (CRESTOR) 10 MG tablet Take 10 mg by mouth daily.    [name] Kitchen amLODipine (NORVASC) 10 MG tablet Take 10 mg by mouth daily.    . benazepril (LOTENSIN) 20 MG tablet Take 20 mg by mouth daily.  0  . celecoxib (CELEBREX) 200 MG capsule Take 200 mg by mouth  daily.    [name] Kitchen glipiZIDE (GLUCOTROL XL) 5 MG 24 hr tablet Take 5 mg by mouth 2 (two) times daily.  0  . KOMBIGLYZE XR 2.02-999 MG TB24 TAKE 1 TABLET 2 TIMES A DAY ORALLY  4   No current facility-administered medications for this visit.     Past Medical History:  Diagnosis Date  . Diabetes mellitus without complication ([place])   . Hyperlipidemia   . Hypertension     Past Surgical History:  Procedure Laterality Date  . endovascular laser N/A     Social History Social History   Tobacco Use  . Smoking status: Former Research scientist (life sciences)  . Smokeless tobacco: Never Used  Substance Use Topics  . Alcohol use: No  . Drug use: No     Family History Family History  Problem Relation Age of Onset  . Breast cancer Sister 58  . Breast cancer Maternal Aunt     No Known Allergies   REVIEW OF SYSTEMS(Negative unless checked)  Constitutional: [] ??Weight loss[] ??Fever[] ??Chills Cardiac:[] ??Chest pain[] ??Chest pressure[] ??Palpitations [] ??Shortness of breath when laying flat [] ??Shortness of breath at rest [] ??Shortness of breath with exertion. Vascular: [x] ??Pain in legs with walking[x] ??Pain in legsat rest[] ??Pain in legs when laying flat [] ??Claudication [] ??Pain in  feet when walking [] ??Pain in feet at rest [] ??Pain in feet when laying flat [] ??History of DVT [] ??Phlebitis [x] ??Swelling in legs [] ??Varicose veins [x] ??Non-healing ulcers Pulmonary: [] ??Uses home oxygen [] ??Productive cough[] ??Hemoptysis [] ??Wheeze [] ??COPD [] ??Asthma Neurologic: [] ??Dizziness [] ??Blackouts [] ??Seizures [] ??History of stroke [] ??History of TIA[] ??Aphasia [] ??Temporary blindness[] ??Dysphagia [] ??Weaknessor numbness in arms [] ??Weakness or numbnessin legs Musculoskeletal: [] ??Arthritis [] ??Joint swelling [] ??Joint pain [] ??Low back pain Hematologic:[] ??Easy bruising[] ??Easy bleeding [] ??Hypercoagulable state [] ??Anemic [] ??Hepatitis  Gastrointestinal:[] ??Blood in stool[] ??Vomiting blood[] ??Gastroesophageal reflux/heartburn[] ??Abdominal pain Genitourinary: [] ??Chronic kidney disease [] ??Difficulturination [] ??Frequenturination [] ??Burning with urination[] ??Hematuria Skin: [] ??Rashes [x] ??Ulcers [x] ??Wounds Psychological: [] ??History of anxiety[] ??History of major depression.  Physical Examination  BP (!) 147/70 (BP Location: Right Arm)   Pulse 69   Resp 16   Wt 191 lb (86.6 kg)   BMI 27.41 kg/m  Gen:  WD/WN, NAD Head: [place]/AT, No temporalis wasting. Ear/Nose/Throat: Hearing grossly intact, nares w/o erythema or drainage Eyes: Conjunctiva clear. Sclera non-icteric Neck: Supple.  Trachea midline Pulmonary:  Good air movement, no use of accessory muscles.  Cardiac: RRR, no JVD Vascular:  Vessel Right Left  Radial Palpable Palpable                          PT  1+ palpable  1+ palpable  DP Palpable Palpable   Gastrointestinal: soft, non-tender/non-distended. No guarding/reflex.  Musculoskeletal: M/S 5/5 throughout.  No deformity or atrophy.  Trace right lower extremity edema, 1+ left lower extremity edema.  Fairly extensive varicosities present on the left with moderate varicosities on the right. Neurologic: Sensation grossly intact in extremities.  Symmetrical.  Speech is fluent.  Psychiatric: Judgment intact, Mood & affect appropriate for pt's clinical situation. Dermatologic: Clean and granulating and now less than the size of a dime with no depth.       Labs No results found for this or any previous visit (from the past 2160 hour(s)).  Radiology Vas [place] Lower Extremity Venous Reflux  Result Date: 07/13/2019  Lower Venous Reflux Study Indications: Pain, Swelling, and ulceration.  Risk Factors: Surgery Patient states she had an ablation 10 years ago unsure of which leg. Performing Technologist: Reece AgarValarie Baldwin RT (R)(VS)  Examination Guidelines: A complete evaluation  includes B-mode imaging, spectral Doppler, color Doppler, and power Doppler as needed of all accessible portions of each vessel. Bilateral testing is considered an integral part of a complete examination. Limited examinations for reoccurring indications may be performed as noted. The reflux portion of the exam is performed with the patient in reverse Trendelenburg.  +---------+---------------+---------+-----------+----------+--------------+ RIGHT    CompressibilityPhasicitySpontaneityPropertiesThrombus Aging +---------+---------------+---------+-----------+----------+--------------+ CFV      Full                                                        +---------+---------------+---------+-----------+----------+--------------+ SFJ      Full                                                        +---------+---------------+---------+-----------+----------+--------------+ FV Prox  Full                                                        +---------+---------------+---------+-----------+----------+--------------+  FV Mid   Full                                                        +---------+---------------+---------+-----------+----------+--------------+ FV DistalFull                                                        +---------+---------------+---------+-----------+----------+--------------+ POP      Full                                                        +---------+---------------+---------+-----------+----------+--------------+ GSV                                                   Not visualized +---------+---------------+---------+-----------+----------+--------------+ SSV      Full                                                        +---------+---------------+---------+-----------+----------+--------------+  +---------+---------------+---------+-----------+----------+--------------+ LEFT      CompressibilityPhasicitySpontaneityPropertiesThrombus Aging +---------+---------------+---------+-----------+----------+--------------+ CFV      Full                                                        +---------+---------------+---------+-----------+----------+--------------+ SFJ      Full                                                        +---------+---------------+---------+-----------+----------+--------------+ FV Prox  Full                                                        +---------+---------------+---------+-----------+----------+--------------+ FV Mid   Full                                                        +---------+---------------+---------+-----------+----------+--------------+ FV DistalFull                                                        +---------+---------------+---------+-----------+----------+--------------+  POP      Full                                                        +---------+---------------+---------+-----------+----------+--------------+ GSV      Full                                                        +---------+---------------+---------+-----------+----------+--------------+ SSV      Full                                                        +---------+---------------+---------+-----------+----------+--------------+ Venous Reflux Times Normal value < 0.5 sec +---+---------+    Left (ms) +---+---------+ [id number]   +---+---------+  Right Reflux Technical Findings: Right GSV not visualized. Possible accessory vein visualized.  Summary: Right: There is no evidence of deep vein thrombosis in the lower extremity.There is no evidence of superficial venous thrombosis. Left: There is no evidence of deep vein thrombosis in the lower extremity.There is no evidence of superficial venous thrombosis.  *See table(s) above for measurements and observations. Electronically signed by Festus Barren MD on  07/13/2019 at 4:50:47 PM.    Final     Assessment/Plan Essential hypertension, benign blood pressure control important in reducing the progression of atherosclerotic disease. On appropriate oral medications.   Diabetes (HCC) blood glucose control important in reducing the progression of atherosclerotic disease. Also, involved in wound healing. On appropriate medications.   Hyperlipidemia, mixed lipid control important in reducing the progression of atherosclerotic disease. Continue statin therapy  Venous ulcer of lower leg without varicose veins (HCC) Were going to try to come out of Unna boots today and use compression garments with Aquacel on the wound.  We will see her back in 3 to 4 weeks to recheck the wound    Festus Barren, MD  08/03/2019 11:19 AM    This note was created with Dragon medical transcription system.  Any errors from dictation are purely unintentional

## 2019-08-03 NOTE — Assessment & Plan Note (Signed)
Were going to try to come out of Unna boots today and use compression garments with Aquacel on the wound.  We will see her back in 3 to 4 weeks to recheck the wound

## 2019-08-11 DIAGNOSIS — I10 Essential (primary) hypertension: Secondary | ICD-10-CM | POA: Diagnosis not present

## 2019-08-11 DIAGNOSIS — E08621 Diabetes mellitus due to underlying condition with foot ulcer: Secondary | ICD-10-CM | POA: Diagnosis not present

## 2019-08-11 DIAGNOSIS — M79671 Pain in right foot: Secondary | ICD-10-CM | POA: Diagnosis not present

## 2019-08-11 DIAGNOSIS — E0842 Diabetes mellitus due to underlying condition with diabetic polyneuropathy: Secondary | ICD-10-CM | POA: Diagnosis not present

## 2019-08-11 DIAGNOSIS — F1120 Opioid dependence, uncomplicated: Secondary | ICD-10-CM | POA: Diagnosis not present

## 2019-08-11 DIAGNOSIS — G894 Chronic pain syndrome: Secondary | ICD-10-CM | POA: Diagnosis not present

## 2019-08-24 ENCOUNTER — Ambulatory Visit (INDEPENDENT_AMBULATORY_CARE_PROVIDER_SITE_OTHER): Payer: Medicare HMO | Admitting: Nurse Practitioner

## 2019-08-24 ENCOUNTER — Encounter (INDEPENDENT_AMBULATORY_CARE_PROVIDER_SITE_OTHER): Payer: Self-pay | Admitting: Nurse Practitioner

## 2019-08-24 ENCOUNTER — Other Ambulatory Visit: Payer: Self-pay

## 2019-08-24 VITALS — BP 157/74 | HR 70 | Resp 17 | Ht 70.0 in | Wt 190.0 lb

## 2019-08-24 DIAGNOSIS — E782 Mixed hyperlipidemia: Secondary | ICD-10-CM | POA: Diagnosis not present

## 2019-08-24 DIAGNOSIS — I10 Essential (primary) hypertension: Secondary | ICD-10-CM | POA: Diagnosis not present

## 2019-08-24 DIAGNOSIS — L97919 Non-pressure chronic ulcer of unspecified part of right lower leg with unspecified severity: Secondary | ICD-10-CM | POA: Diagnosis not present

## 2019-08-24 NOTE — Progress Notes (Signed)
SUBJECTIVE:  Patient ID: [name], female    DOB: 1946-03-15, 73 y.o.   MRN: [id number] Chief Complaint  Patient presents with  . Follow-up    wound check    HPI  [name] is a 73 y.o. female that presents today for wound evaluation of her right lateral ankle ulceration.  The ankle ulceration appears to continue healing well.  There is no evidence of infection.  There is good granulation tissue at the base of the wound.  Minimal edema.  The patient states that she has been doing well with wound care.  She denies any issues currently.  She denies any fever, chills, nausea, vomiting or diarrhea.  Past Medical History:  Diagnosis Date  . Diabetes mellitus without complication (HCC)   . Hyperlipidemia   . Hypertension     Past Surgical History:  Procedure Laterality Date  . endovascular laser N/A     Social History   Socioeconomic History  . Marital status: Married    Spouse name: Not on file  . Number of children: Not on file  . Years of education: Not on file  . Highest education level: Not on file  Occupational History  . Not on file  Social Needs  . Financial resource strain: Not on file  . Food insecurity    Worry: Not on file    Inability: Not on file  . Transportation needs    Medical: Not on file    Non-medical: Not on file  Tobacco Use  . Smoking status: Former Games developer  . Smokeless tobacco: Never Used  Substance and Sexual Activity  . Alcohol use: No  . Drug use: No  . Sexual activity: Not on file  Lifestyle  . Physical activity    Days per week: Not on file    Minutes per session: Not on file  . Stress: Not on file  Relationships  . Social Musician on phone: Not on file    Gets together: Not on file    Attends religious service: Not on file    Active member of club or organization: Not on file    Attends meetings of clubs or organizations: Not on file    Relationship status: Not on file  . Intimate partner violence    Fear of  current or ex partner: Not on file    Emotionally abused: Not on file    Physically abused: Not on file    Forced sexual activity: Not on file  Other Topics Concern  . Not on file  Social History Narrative  . Not on file    Family History  Problem Relation Age of Onset  . Breast cancer Sister 75  . Breast cancer Maternal Aunt     No Known Allergies   Review of Systems   Review of Systems: Negative Unless Checked Constitutional: [] Weight loss  [] Fever  [] Chills Cardiac: [] Chest pain   []  Atrial Fibrillation  [] Palpitations   [] Shortness of breath when laying flat   [] Shortness of breath with exertion. [] Shortness of breath at rest Vascular:  [] Pain in legs with walking   [] Pain in legs with standing [] Pain in legs when laying flat   [] Claudication    [] Pain in feet when laying flat    [] History of DVT   [] Phlebitis   [x] Swelling in legs   [] Varicose veins   [] Non-healing ulcers Pulmonary:   [] Uses home oxygen   [] Productive cough   [] Hemoptysis   []   Wheeze  [] COPD   [] Asthma Neurologic:  [] Dizziness   [] Seizures  [] Blackouts [] History of stroke   [] History of TIA  [] Aphasia   [] Temporary Blindness   [] Weakness or numbness in arm   [] Weakness or numbness in leg Musculoskeletal:   [] Joint swelling   [] Joint pain   [] Low back pain  []  History of Knee Replacement [] Arthritis [] back Surgeries  []  Spinal Stenosis    Hematologic:  [] Easy bruising  [] Easy bleeding   [] Hypercoagulable state   [] Anemic Gastrointestinal:  [] Diarrhea   [] Vomiting  [] Gastroesophageal reflux/heartburn   [] Difficulty swallowing. [] Abdominal pain Genitourinary:  [] Chronic kidney disease   [] Difficult urination  [] Anuric   [] Blood in urine [] Frequent urination  [] Burning with urination   [] Hematuria Skin:  [x] Rashes   [x] Ulcers [] Wounds Psychological:  [] History of anxiety   []  History of major depression  []  Memory Difficulties      OBJECTIVE:   Physical Exam  BP (!) 157/74 (BP Location: Right Arm)   Pulse 70    Resp 17   Ht 5\' 10"  (1.778 m)   Wt 190 lb (86.2 kg)   BMI 27.26 kg/m   Gen: WD/WN, NAD Head: [place]/AT, No temporalis wasting.  Ear/Nose/Throat: Hearing grossly intact, nares w/o erythema or drainage Eyes: PER, EOMI, sclera nonicteric.  Neck: Supple, no masses.  No JVD.  Pulmonary:  Good air movement, no use of accessory muscles.  Cardiac: RRR Vascular:  Smaller than a dime ulceration on over right lateral ankle.  1+ edema bilaterally.  Mild venous stasis changes Vessel Right Left  Radial Palpable Palpable  Dorsalis Pedis Palpable Palpable  Posterior Tibial Palpable Palpable   Gastrointestinal: soft, non-distended. No guarding/no peritoneal signs.  Musculoskeletal: M/S 5/5 throughout.  No deformity or atrophy.  Neurologic: Pain and light touch intact in extremities.  Symmetrical.  Speech is fluent. Motor exam as listed above. Psychiatric: Judgment intact, Mood & affect appropriate for pt's clinical situation. Dermatologic: .  No changes consistent with cellulitis. Lymph : No Cervical lymphadenopathy, no lichenification or skin changes of chronic lymphedema.       ASSESSMENT AND PLAN:  1. Chronic ulcer of right leg, with unspecified severity (HCC) At this time the patient's ulceration is healing well.  I have instructed her to continue with using Aquacel in the wound bed to be changed 3 times a week.  The patient is also instructed to keep it clean dry and intact.  We will have the patient follow-up in 6 weeks to evaluate progress of the wound.  Otherwise she is instructed to call our office if she begins to have any new drainage that is foul-smelling or new redness or worsening pain.  Patient is also instructed to wear her compression socks daily as well as to continue to elevate  2. Hyperlipidemia, mixed Continue statin as ordered and reviewed, no changes at this time   3. Essential hypertension, benign Continue antihypertensive medications as already ordered, these medications  have been reviewed and there are no changes at this time.    Current Outpatient Medications on File Prior to Visit  Medication Sig Dispense Refill  . aspirin EC 81 MG tablet Take 81 mg by mouth daily.    [name] Kitchen etodolac (LODINE) 400 MG tablet TAKE 1 TABLET BY MOUTH DAILY    . furosemide (LASIX) 20 MG tablet     . gabapentin (NEURONTIN) 300 MG capsule Take 300 mg by mouth 3 (three) times daily.     [name] Kitchen glimepiride (AMARYL) 4 MG tablet     .  hydrochlorothiazide (HYDRODIURIL) 25 MG tablet Take 25 mg by mouth 2 (two) times daily.  1  . HYDROcodone-acetaminophen (NORCO/VICODIN) 5-325 MG tablet     . metFORMIN (GLUCOPHAGE) 1000 MG tablet     . metoprolol succinate (TOPROL-XL) 50 MG 24 hr tablet TAKE 1 TABLET ONCE A DAY ORALLY  0  . olmesartan (BENICAR) 40 MG tablet TAKE ONE TABLET BY MOUTH EVERY DAY    . omeprazole (PRILOSEC) 20 MG capsule Take by mouth.    . rosuvastatin (CRESTOR) 10 MG tablet Take 10 mg by mouth daily.    [name] Kitchen triamcinolone cream (KENALOG) 0.1 % APPLY TO AFFECTED AREAS TWICE DAILY    . amLODipine (NORVASC) 10 MG tablet Take 10 mg by mouth daily.    . benazepril (LOTENSIN) 20 MG tablet Take 20 mg by mouth daily.  0  . celecoxib (CELEBREX) 200 MG capsule Take 200 mg by mouth daily.    . Cholecalciferol (VITAMIN D-3) 5000 units TABS Take 5,000 Units by mouth daily.    . cyanocobalamin (,VITAMIN B-12,) 1000 MCG/ML injection Inject into the muscle.    [name] Kitchen glipiZIDE (GLUCOTROL XL) 5 MG 24 hr tablet Take 5 mg by mouth 2 (two) times daily.  0  . hydrocortisone 2.5 % cream Apply topically 2 (two) times daily. (Patient not taking: Reported on 08/24/2019) 30 g 0  . KOMBIGLYZE XR 2.02-999 MG TB24 TAKE 1 TABLET 2 TIMES A DAY ORALLY  4  . Multiple Vitamin (MULTI-VITAMIN) tablet Take by mouth.     No current facility-administered medications on file prior to visit.     There are no Patient Instructions on file for this visit. No follow-ups on file.   Kris Hartmann, NP  This note was  completed with Sales executive.  Any errors are purely unintentional.

## 2019-09-08 DIAGNOSIS — E0842 Diabetes mellitus due to underlying condition with diabetic polyneuropathy: Secondary | ICD-10-CM | POA: Diagnosis not present

## 2019-09-08 DIAGNOSIS — I10 Essential (primary) hypertension: Secondary | ICD-10-CM | POA: Diagnosis not present

## 2019-09-08 DIAGNOSIS — E08621 Diabetes mellitus due to underlying condition with foot ulcer: Secondary | ICD-10-CM | POA: Diagnosis not present

## 2019-09-08 DIAGNOSIS — G894 Chronic pain syndrome: Secondary | ICD-10-CM | POA: Diagnosis not present

## 2019-09-08 DIAGNOSIS — F1120 Opioid dependence, uncomplicated: Secondary | ICD-10-CM | POA: Diagnosis not present

## 2019-09-08 DIAGNOSIS — M79671 Pain in right foot: Secondary | ICD-10-CM | POA: Diagnosis not present

## 2019-09-08 DIAGNOSIS — Z79899 Other long term (current) drug therapy: Secondary | ICD-10-CM | POA: Diagnosis not present

## 2019-10-05 ENCOUNTER — Other Ambulatory Visit: Payer: Self-pay

## 2019-10-05 ENCOUNTER — Encounter (INDEPENDENT_AMBULATORY_CARE_PROVIDER_SITE_OTHER): Payer: Self-pay | Admitting: Vascular Surgery

## 2019-10-05 ENCOUNTER — Ambulatory Visit (INDEPENDENT_AMBULATORY_CARE_PROVIDER_SITE_OTHER): Payer: Medicare HMO | Admitting: Vascular Surgery

## 2019-10-05 VITALS — BP 162/75 | HR 71 | Resp 16 | Wt 192.6 lb

## 2019-10-05 DIAGNOSIS — E782 Mixed hyperlipidemia: Secondary | ICD-10-CM | POA: Diagnosis not present

## 2019-10-05 DIAGNOSIS — I10 Essential (primary) hypertension: Secondary | ICD-10-CM

## 2019-10-05 DIAGNOSIS — L98499 Non-pressure chronic ulcer of skin of other sites with unspecified severity: Secondary | ICD-10-CM

## 2019-10-05 DIAGNOSIS — L97909 Non-pressure chronic ulcer of unspecified part of unspecified lower leg with unspecified severity: Secondary | ICD-10-CM | POA: Diagnosis not present

## 2019-10-05 DIAGNOSIS — E11622 Type 2 diabetes mellitus with other skin ulcer: Secondary | ICD-10-CM | POA: Diagnosis not present

## 2019-10-05 DIAGNOSIS — I872 Venous insufficiency (chronic) (peripheral): Secondary | ICD-10-CM

## 2019-10-05 NOTE — Assessment & Plan Note (Signed)
lipid control important in reducing the progression of atherosclerotic disease. Continue statin therapy  

## 2019-10-05 NOTE — Assessment & Plan Note (Signed)
For now, we will continue the same local wound care.  We will plan to see her back in 6 to 8 weeks in follow-up.

## 2019-10-05 NOTE — Assessment & Plan Note (Signed)
blood glucose control important in reducing the progression of atherosclerotic disease. Also, involved in wound healing. On appropriate medications.  

## 2019-10-05 NOTE — Progress Notes (Signed)
MRN : [id number]  [name] is a 73 y.o. (1946-02-21) female who presents with chief complaint of  Chief Complaint  Patient presents with  . Follow-up    6week follow up  .  History of Present Illness: Patient returns today in follow up of her right lateral ankle ulceration.  This is slowly improving albeit quite slowly.  Her swelling in that leg is much better.  The depth of the wound has improved although there still remains a slight amount of depth.  It is only about a centimeter across or less at this point.  No erythema.  Her leg in the area is exquisitely tender to the touch likely due to neuropathic changes.  Current Outpatient Medications  Medication Sig Dispense Refill  . aspirin EC 81 MG tablet Take 81 mg by mouth daily.    [name] Kitchen etodolac (LODINE) 400 MG tablet TAKE 1 TABLET BY MOUTH DAILY    . furosemide (LASIX) 20 MG tablet daily.     [name] Kitchen gabapentin (NEURONTIN) 300 MG capsule Take 300 mg by mouth 3 (three) times daily.     [name] Kitchen glimepiride (AMARYL) 4 MG tablet     . hydrochlorothiazide (HYDRODIURIL) 25 MG tablet Take 25 mg by mouth 2 (two) times daily.  1  . HYDROcodone-acetaminophen (NORCO/VICODIN) 5-325 MG tablet     . metFORMIN (GLUCOPHAGE) 1000 MG tablet     . metoprolol succinate (TOPROL-XL) 50 MG 24 hr tablet   0  . olmesartan (BENICAR) 40 MG tablet TAKE ONE TABLET BY MOUTH EVERY DAY    . omeprazole (PRILOSEC) 20 MG capsule Take by mouth.    . rosuvastatin (CRESTOR) 10 MG tablet Take 10 mg by mouth daily.    [name] Kitchen triamcinolone cream (KENALOG) 0.1 % APPLY TO AFFECTED AREAS TWICE DAILY    . amLODipine (NORVASC) 10 MG tablet Take 10 mg by mouth daily.    . benazepril (LOTENSIN) 20 MG tablet Take 20 mg by mouth daily.  0  . celecoxib (CELEBREX) 200 MG capsule Take 200 mg by mouth daily.    . Cholecalciferol (VITAMIN D-3) 5000 units TABS Take 5,000 Units by mouth daily.    . cyanocobalamin (,VITAMIN B-12,) 1000 MCG/ML injection Inject into the muscle.    [name] Kitchen glipiZIDE (GLUCOTROL  XL) 5 MG 24 hr tablet Take 5 mg by mouth 2 (two) times daily.  0  . hydrocortisone 2.5 % cream Apply topically 2 (two) times daily. (Patient not taking: Reported on 08/24/2019) 30 g 0  . KOMBIGLYZE XR 2.02-999 MG TB24 TAKE 1 TABLET 2 TIMES A DAY ORALLY  4  . Multiple Vitamin (MULTI-VITAMIN) tablet Take by mouth.     No current facility-administered medications for this visit.    Past Medical History:  Diagnosis Date  . Diabetes mellitus without complication ([place])   . Hyperlipidemia   . Hypertension     Past Surgical History:  Procedure Laterality Date  . endovascular laser N/A      Social History   Tobacco Use  . Smoking status: Former Research scientist (life sciences)  . Smokeless tobacco: Never Used  Substance Use Topics  . Alcohol use: No  . Drug use: No    Family History  Problem Relation Age of Onset  . Breast cancer Sister 58  . Breast cancer Maternal Aunt      No Known Allergies   REVIEW OF SYSTEMS (Negative unless checked) Constitutional: [] ?Weight loss[] ?Fever[] ?Chills Cardiac:[] ?Chest pain[] ? Atrial Fibrillation[] ?Palpitations [] ?Shortness of breath when laying flat [] ?Shortness of breath with exertion. [] ?  Shortness of breath at rest Vascular: [] ?Pain in legs with walking[] ?Pain in legswith standing[] ?Pain in legs when laying flat [] ?Claudication  [] ?Pain in feet when laying flat [] ?History of DVT [] ?Phlebitis [x] ?Swelling in legs [] ?Varicose veins [] ?Non-healing ulcers Pulmonary: [] ?Uses home oxygen [] ?Productive cough[] ?Hemoptysis [] ?Wheeze [] ?COPD [] ?Asthma Neurologic: [] ?Dizziness[] ?Seizures [] ?Blackouts[] ?History of stroke [] ?History of TIA[] ?Aphasia [] ?Temporary Blindness[] ?Weaknessor numbness in arm [] ?Weakness or numbnessin leg Musculoskeletal:[] ?Joint swelling [] ?Joint pain [] ?Low back pain  [] ? History of Knee Replacement [] ?Arthritis [] ?back Surgeries[] ? Spinal Stenosis  Hematologic:[] ?Easy  bruising[] ?Easy bleeding [] ?Hypercoagulable state [] ?Anemic Gastrointestinal:[] ?Diarrhea [] ?Vomiting[] ?Gastroesophageal reflux/heartburn[] ?Difficulty swallowing. [] ?Abdominal pain Genitourinary: [] ?Chronic kidney disease [] ?Difficulturination [] ?Anuric[] ?Blood in urine [] ?Frequenturination [] ?Burning with urination[] ?Hematuria Skin: [x] ?Rashes [x] ?Ulcers [] ?Wounds Psychological: [] ?History of anxiety[] ?History of major depression  [] ? Memory Difficulties  Physical Examination  BP (!) 162/75 (BP Location: Right Arm)   Pulse 71   Resp 16   Wt 192 lb 9.6 oz (87.4 kg)   BMI 27.64 kg/m  Gen:  WD/WN, NAD Head: [place]/AT, No temporalis wasting. Ear/Nose/Throat: Hearing grossly intact, nares w/o erythema or drainage Eyes: Conjunctiva clear. Sclera non-icteric Neck: Supple.  Trachea midline Pulmonary:  Good air movement, no use of accessory muscles.  Cardiac: RRR, no JVD Vascular:  Vessel Right Left  Radial Palpable Palpable                          PT Palpable Palpable  DP Palpable Palpable   Gastrointestinal: soft, non-tender/non-distended. No guarding/reflex.  Musculoskeletal: M/S 5/5 throughout.  No deformity or atrophy.  Right lateral ankle wound as above.  No erythema or drainage.  It is a centimeter or less.  There is very mild right leg edema at this point. Neurologic: Sensation grossly intact in extremities.  Symmetrical.  Speech is fluent.  Psychiatric: Judgment intact, Mood & affect appropriate for pt's clinical situation. Dermatologic: No rashes or ulcers noted.  No cellulitis or open wounds.       Labs No results found for this or any previous visit (from the past 2160 hour(s)).  Radiology No results found.  Assessment/Plan  Hyperlipidemia, mixed lipid control important in reducing the progression of atherosclerotic disease. Continue statin therapy   Essential hypertension, benign blood pressure control important in reducing  the progression of atherosclerotic disease. On appropriate oral medications.   Diabetes (HCC) blood glucose control important in reducing the progression of atherosclerotic disease. Also, involved in wound healing. On appropriate medications.   Venous ulcer of lower leg without varicose veins (HCC) For now, we will continue the same local wound care.  We will plan to see her back in 6 to 8 weeks in follow-up.    , MD  10/05/2019 2:30 PM    This note was created with Dragon medical transcription system.  Any errors from dictation are purely unintentional

## 2019-10-05 NOTE — Assessment & Plan Note (Signed)
blood pressure control important in reducing the progression of atherosclerotic disease. On appropriate oral medications.  

## 2019-10-28 DIAGNOSIS — E1140 Type 2 diabetes mellitus with diabetic neuropathy, unspecified: Secondary | ICD-10-CM | POA: Diagnosis not present

## 2019-10-28 DIAGNOSIS — E538 Deficiency of other specified B group vitamins: Secondary | ICD-10-CM | POA: Diagnosis not present

## 2019-11-03 DIAGNOSIS — I10 Essential (primary) hypertension: Secondary | ICD-10-CM | POA: Diagnosis not present

## 2019-11-03 DIAGNOSIS — F1120 Opioid dependence, uncomplicated: Secondary | ICD-10-CM | POA: Diagnosis not present

## 2019-11-03 DIAGNOSIS — E08621 Diabetes mellitus due to underlying condition with foot ulcer: Secondary | ICD-10-CM | POA: Diagnosis not present

## 2019-11-03 DIAGNOSIS — M79671 Pain in right foot: Secondary | ICD-10-CM | POA: Diagnosis not present

## 2019-11-03 DIAGNOSIS — G894 Chronic pain syndrome: Secondary | ICD-10-CM | POA: Diagnosis not present

## 2019-11-03 DIAGNOSIS — E0842 Diabetes mellitus due to underlying condition with diabetic polyneuropathy: Secondary | ICD-10-CM | POA: Diagnosis not present

## 2019-11-03 DIAGNOSIS — Z79899 Other long term (current) drug therapy: Secondary | ICD-10-CM | POA: Diagnosis not present

## 2019-11-04 DIAGNOSIS — E1140 Type 2 diabetes mellitus with diabetic neuropathy, unspecified: Secondary | ICD-10-CM | POA: Diagnosis not present

## 2019-11-04 DIAGNOSIS — E785 Hyperlipidemia, unspecified: Secondary | ICD-10-CM | POA: Diagnosis not present

## 2019-11-04 DIAGNOSIS — D510 Vitamin B12 deficiency anemia due to intrinsic factor deficiency: Secondary | ICD-10-CM | POA: Diagnosis not present

## 2019-11-04 DIAGNOSIS — Z87891 Personal history of nicotine dependence: Secondary | ICD-10-CM | POA: Diagnosis not present

## 2019-11-04 DIAGNOSIS — L97919 Non-pressure chronic ulcer of unspecified part of right lower leg with unspecified severity: Secondary | ICD-10-CM | POA: Diagnosis not present

## 2019-11-04 DIAGNOSIS — Z0000 Encounter for general adult medical examination without abnormal findings: Secondary | ICD-10-CM | POA: Diagnosis not present

## 2019-11-16 ENCOUNTER — Other Ambulatory Visit: Payer: Self-pay | Admitting: Internal Medicine

## 2019-11-16 DIAGNOSIS — Z1231 Encounter for screening mammogram for malignant neoplasm of breast: Secondary | ICD-10-CM

## 2019-11-23 ENCOUNTER — Ambulatory Visit (INDEPENDENT_AMBULATORY_CARE_PROVIDER_SITE_OTHER): Payer: Medicare HMO | Admitting: Vascular Surgery

## 2019-12-08 DIAGNOSIS — G894 Chronic pain syndrome: Secondary | ICD-10-CM | POA: Diagnosis not present

## 2019-12-08 DIAGNOSIS — I10 Essential (primary) hypertension: Secondary | ICD-10-CM | POA: Diagnosis not present

## 2019-12-08 DIAGNOSIS — M79671 Pain in right foot: Secondary | ICD-10-CM | POA: Diagnosis not present

## 2019-12-08 DIAGNOSIS — Z79899 Other long term (current) drug therapy: Secondary | ICD-10-CM | POA: Diagnosis not present

## 2019-12-08 DIAGNOSIS — E08621 Diabetes mellitus due to underlying condition with foot ulcer: Secondary | ICD-10-CM | POA: Diagnosis not present

## 2019-12-08 DIAGNOSIS — E0842 Diabetes mellitus due to underlying condition with diabetic polyneuropathy: Secondary | ICD-10-CM | POA: Diagnosis not present

## 2019-12-08 DIAGNOSIS — F1120 Opioid dependence, uncomplicated: Secondary | ICD-10-CM | POA: Diagnosis not present

## 2020-01-05 DIAGNOSIS — G894 Chronic pain syndrome: Secondary | ICD-10-CM | POA: Diagnosis not present

## 2020-01-05 DIAGNOSIS — E08621 Diabetes mellitus due to underlying condition with foot ulcer: Secondary | ICD-10-CM | POA: Diagnosis not present

## 2020-01-05 DIAGNOSIS — F1120 Opioid dependence, uncomplicated: Secondary | ICD-10-CM | POA: Diagnosis not present

## 2020-01-05 DIAGNOSIS — I10 Essential (primary) hypertension: Secondary | ICD-10-CM | POA: Diagnosis not present

## 2020-01-05 DIAGNOSIS — E0842 Diabetes mellitus due to underlying condition with diabetic polyneuropathy: Secondary | ICD-10-CM | POA: Diagnosis not present

## 2020-01-05 DIAGNOSIS — M79671 Pain in right foot: Secondary | ICD-10-CM | POA: Diagnosis not present

## 2020-01-05 DIAGNOSIS — Z79899 Other long term (current) drug therapy: Secondary | ICD-10-CM | POA: Diagnosis not present

## 2020-01-10 ENCOUNTER — Ambulatory Visit
Admission: RE | Admit: 2020-01-10 | Discharge: 2020-01-10 | Disposition: A | Discharge status: Home / Self Care | Payer: Medicare HMO | Source: Ambulatory Visit | Attending: Internal Medicine | Admitting: Internal Medicine

## 2020-01-10 DIAGNOSIS — Z1231 Encounter for screening mammogram for malignant neoplasm of breast: Secondary | ICD-10-CM | POA: Insufficient documentation

## 2020-02-03 DIAGNOSIS — E1165 Type 2 diabetes mellitus with hyperglycemia: Secondary | ICD-10-CM | POA: Diagnosis not present

## 2020-02-04 DIAGNOSIS — E119 Type 2 diabetes mellitus without complications: Secondary | ICD-10-CM | POA: Diagnosis not present

## 2020-02-04 DIAGNOSIS — S81801D Unspecified open wound, right lower leg, subsequent encounter: Secondary | ICD-10-CM | POA: Diagnosis not present

## 2020-02-04 DIAGNOSIS — L03115 Cellulitis of right lower limb: Secondary | ICD-10-CM | POA: Diagnosis not present

## 2020-02-04 DIAGNOSIS — Z87891 Personal history of nicotine dependence: Secondary | ICD-10-CM | POA: Diagnosis not present

## 2020-02-04 DIAGNOSIS — D510 Vitamin B12 deficiency anemia due to intrinsic factor deficiency: Secondary | ICD-10-CM | POA: Diagnosis not present

## 2020-02-14 DIAGNOSIS — E0842 Diabetes mellitus due to underlying condition with diabetic polyneuropathy: Secondary | ICD-10-CM | POA: Diagnosis not present

## 2020-02-14 DIAGNOSIS — I10 Essential (primary) hypertension: Secondary | ICD-10-CM | POA: Diagnosis not present

## 2020-02-14 DIAGNOSIS — Z79899 Other long term (current) drug therapy: Secondary | ICD-10-CM | POA: Diagnosis not present

## 2020-02-14 DIAGNOSIS — E08621 Diabetes mellitus due to underlying condition with foot ulcer: Secondary | ICD-10-CM | POA: Diagnosis not present

## 2020-02-14 DIAGNOSIS — M79671 Pain in right foot: Secondary | ICD-10-CM | POA: Diagnosis not present

## 2020-02-14 DIAGNOSIS — G894 Chronic pain syndrome: Secondary | ICD-10-CM | POA: Diagnosis not present

## 2020-02-14 DIAGNOSIS — F1120 Opioid dependence, uncomplicated: Secondary | ICD-10-CM | POA: Diagnosis not present

## 2020-03-13 DIAGNOSIS — E08621 Diabetes mellitus due to underlying condition with foot ulcer: Secondary | ICD-10-CM | POA: Diagnosis not present

## 2020-03-13 DIAGNOSIS — I10 Essential (primary) hypertension: Secondary | ICD-10-CM | POA: Diagnosis not present

## 2020-03-13 DIAGNOSIS — Z79899 Other long term (current) drug therapy: Secondary | ICD-10-CM | POA: Diagnosis not present

## 2020-03-13 DIAGNOSIS — M79671 Pain in right foot: Secondary | ICD-10-CM | POA: Diagnosis not present

## 2020-03-13 DIAGNOSIS — F1120 Opioid dependence, uncomplicated: Secondary | ICD-10-CM | POA: Diagnosis not present

## 2020-03-13 DIAGNOSIS — E0842 Diabetes mellitus due to underlying condition with diabetic polyneuropathy: Secondary | ICD-10-CM | POA: Diagnosis not present

## 2020-03-13 DIAGNOSIS — G894 Chronic pain syndrome: Secondary | ICD-10-CM | POA: Diagnosis not present

## 2020-04-03 DIAGNOSIS — E08621 Diabetes mellitus due to underlying condition with foot ulcer: Secondary | ICD-10-CM | POA: Diagnosis not present

## 2020-04-03 DIAGNOSIS — M79671 Pain in right foot: Secondary | ICD-10-CM | POA: Diagnosis not present

## 2020-04-03 DIAGNOSIS — E0842 Diabetes mellitus due to underlying condition with diabetic polyneuropathy: Secondary | ICD-10-CM | POA: Diagnosis not present

## 2020-04-03 DIAGNOSIS — Z79899 Other long term (current) drug therapy: Secondary | ICD-10-CM | POA: Diagnosis not present

## 2020-04-03 DIAGNOSIS — I10 Essential (primary) hypertension: Secondary | ICD-10-CM | POA: Diagnosis not present

## 2020-04-03 DIAGNOSIS — G894 Chronic pain syndrome: Secondary | ICD-10-CM | POA: Diagnosis not present

## 2020-04-03 DIAGNOSIS — F1120 Opioid dependence, uncomplicated: Secondary | ICD-10-CM | POA: Diagnosis not present

## 2020-04-26 DIAGNOSIS — S91001A Unspecified open wound, right ankle, initial encounter: Secondary | ICD-10-CM | POA: Diagnosis not present

## 2020-04-26 DIAGNOSIS — E1140 Type 2 diabetes mellitus with diabetic neuropathy, unspecified: Secondary | ICD-10-CM | POA: Diagnosis not present

## 2020-04-26 DIAGNOSIS — E1165 Type 2 diabetes mellitus with hyperglycemia: Secondary | ICD-10-CM | POA: Diagnosis not present

## 2020-04-26 DIAGNOSIS — E538 Deficiency of other specified B group vitamins: Secondary | ICD-10-CM | POA: Diagnosis not present

## 2020-05-01 DIAGNOSIS — M79671 Pain in right foot: Secondary | ICD-10-CM | POA: Diagnosis not present

## 2020-05-01 DIAGNOSIS — G894 Chronic pain syndrome: Secondary | ICD-10-CM | POA: Diagnosis not present

## 2020-05-01 DIAGNOSIS — E08621 Diabetes mellitus due to underlying condition with foot ulcer: Secondary | ICD-10-CM | POA: Diagnosis not present

## 2020-05-01 DIAGNOSIS — F1120 Opioid dependence, uncomplicated: Secondary | ICD-10-CM | POA: Diagnosis not present

## 2020-05-01 DIAGNOSIS — Z79899 Other long term (current) drug therapy: Secondary | ICD-10-CM | POA: Diagnosis not present

## 2020-05-01 DIAGNOSIS — E0842 Diabetes mellitus due to underlying condition with diabetic polyneuropathy: Secondary | ICD-10-CM | POA: Diagnosis not present

## 2020-05-01 DIAGNOSIS — I10 Essential (primary) hypertension: Secondary | ICD-10-CM | POA: Diagnosis not present

## 2020-05-03 DIAGNOSIS — E1140 Type 2 diabetes mellitus with diabetic neuropathy, unspecified: Secondary | ICD-10-CM | POA: Diagnosis not present

## 2020-05-03 DIAGNOSIS — E1165 Type 2 diabetes mellitus with hyperglycemia: Secondary | ICD-10-CM | POA: Diagnosis not present

## 2020-05-29 DIAGNOSIS — E0842 Diabetes mellitus due to underlying condition with diabetic polyneuropathy: Secondary | ICD-10-CM | POA: Diagnosis not present

## 2020-05-29 DIAGNOSIS — E08621 Diabetes mellitus due to underlying condition with foot ulcer: Secondary | ICD-10-CM | POA: Diagnosis not present

## 2020-05-29 DIAGNOSIS — G8929 Other chronic pain: Secondary | ICD-10-CM | POA: Diagnosis not present

## 2020-05-29 DIAGNOSIS — G894 Chronic pain syndrome: Secondary | ICD-10-CM | POA: Diagnosis not present

## 2020-05-29 DIAGNOSIS — M79671 Pain in right foot: Secondary | ICD-10-CM | POA: Diagnosis not present

## 2020-05-29 DIAGNOSIS — Z79899 Other long term (current) drug therapy: Secondary | ICD-10-CM | POA: Diagnosis not present

## 2020-05-29 DIAGNOSIS — F1120 Opioid dependence, uncomplicated: Secondary | ICD-10-CM | POA: Diagnosis not present

## 2020-05-29 DIAGNOSIS — I10 Essential (primary) hypertension: Secondary | ICD-10-CM | POA: Diagnosis not present

## 2020-06-26 DIAGNOSIS — F1120 Opioid dependence, uncomplicated: Secondary | ICD-10-CM | POA: Diagnosis not present

## 2020-06-26 DIAGNOSIS — Z79891 Long term (current) use of opiate analgesic: Secondary | ICD-10-CM | POA: Diagnosis not present

## 2020-07-05 DIAGNOSIS — G894 Chronic pain syndrome: Secondary | ICD-10-CM | POA: Diagnosis not present

## 2020-07-05 DIAGNOSIS — I10 Essential (primary) hypertension: Secondary | ICD-10-CM | POA: Diagnosis not present

## 2020-07-05 DIAGNOSIS — M79671 Pain in right foot: Secondary | ICD-10-CM | POA: Diagnosis not present

## 2020-07-05 DIAGNOSIS — E0842 Diabetes mellitus due to underlying condition with diabetic polyneuropathy: Secondary | ICD-10-CM | POA: Diagnosis not present

## 2020-07-05 DIAGNOSIS — E08621 Diabetes mellitus due to underlying condition with foot ulcer: Secondary | ICD-10-CM | POA: Diagnosis not present

## 2020-07-05 DIAGNOSIS — F1120 Opioid dependence, uncomplicated: Secondary | ICD-10-CM | POA: Diagnosis not present

## 2020-08-03 DIAGNOSIS — E1165 Type 2 diabetes mellitus with hyperglycemia: Secondary | ICD-10-CM | POA: Diagnosis not present

## 2020-08-29 DIAGNOSIS — Z23 Encounter for immunization: Secondary | ICD-10-CM | POA: Diagnosis not present

## 2020-08-29 DIAGNOSIS — M5116 Intervertebral disc disorders with radiculopathy, lumbar region: Secondary | ICD-10-CM | POA: Diagnosis not present

## 2020-09-04 DIAGNOSIS — E08621 Diabetes mellitus due to underlying condition with foot ulcer: Secondary | ICD-10-CM | POA: Diagnosis not present

## 2020-09-04 DIAGNOSIS — G894 Chronic pain syndrome: Secondary | ICD-10-CM | POA: Diagnosis not present

## 2020-09-04 DIAGNOSIS — M79671 Pain in right foot: Secondary | ICD-10-CM | POA: Diagnosis not present

## 2020-09-04 DIAGNOSIS — E0842 Diabetes mellitus due to underlying condition with diabetic polyneuropathy: Secondary | ICD-10-CM | POA: Diagnosis not present

## 2020-09-04 DIAGNOSIS — F1120 Opioid dependence, uncomplicated: Secondary | ICD-10-CM | POA: Diagnosis not present

## 2020-09-04 DIAGNOSIS — I10 Essential (primary) hypertension: Secondary | ICD-10-CM | POA: Diagnosis not present

## 2020-09-14 ENCOUNTER — Other Ambulatory Visit: Payer: Self-pay

## 2020-09-14 ENCOUNTER — Encounter: Payer: Self-pay | Admitting: Emergency Medicine

## 2020-09-14 ENCOUNTER — Observation Stay
Admission: EM | Admit: 2020-09-14 | Discharge: 2020-09-15 | Disposition: A | Discharge status: Home / Self Care | Payer: Medicare HMO | Attending: Obstetrics and Gynecology | Admitting: Obstetrics and Gynecology

## 2020-09-14 ENCOUNTER — Emergency Department: Payer: Medicare HMO

## 2020-09-14 DIAGNOSIS — Z87891 Personal history of nicotine dependence: Secondary | ICD-10-CM | POA: Insufficient documentation

## 2020-09-14 DIAGNOSIS — Z7982 Long term (current) use of aspirin: Secondary | ICD-10-CM | POA: Insufficient documentation

## 2020-09-14 DIAGNOSIS — R0789 Other chest pain: Secondary | ICD-10-CM | POA: Diagnosis not present

## 2020-09-14 DIAGNOSIS — I10 Essential (primary) hypertension: Secondary | ICD-10-CM | POA: Insufficient documentation

## 2020-09-14 DIAGNOSIS — E11622 Type 2 diabetes mellitus with other skin ulcer: Secondary | ICD-10-CM

## 2020-09-14 DIAGNOSIS — Z20822 Contact with and (suspected) exposure to covid-19: Secondary | ICD-10-CM | POA: Insufficient documentation

## 2020-09-14 DIAGNOSIS — Z7984 Long term (current) use of oral hypoglycemic drugs: Secondary | ICD-10-CM | POA: Insufficient documentation

## 2020-09-14 DIAGNOSIS — L98499 Non-pressure chronic ulcer of skin of other sites with unspecified severity: Secondary | ICD-10-CM

## 2020-09-14 DIAGNOSIS — E119 Type 2 diabetes mellitus without complications: Secondary | ICD-10-CM

## 2020-09-14 DIAGNOSIS — I447 Left bundle-branch block, unspecified: Secondary | ICD-10-CM

## 2020-09-14 DIAGNOSIS — Z79899 Other long term (current) drug therapy: Secondary | ICD-10-CM | POA: Diagnosis not present

## 2020-09-14 DIAGNOSIS — R079 Chest pain, unspecified: Secondary | ICD-10-CM | POA: Diagnosis not present

## 2020-09-14 DIAGNOSIS — I5189 Other ill-defined heart diseases: Secondary | ICD-10-CM

## 2020-09-14 HISTORY — DX: Chest pain, unspecified: R07.9

## 2020-09-14 HISTORY — DX: Personal history of other medical treatment: Z92.89

## 2020-09-14 HISTORY — DX: Left bundle-branch block, unspecified: I44.7

## 2020-09-14 LAB — CBC
HCT: 44.2 % (ref 36.0–46.0)
Hemoglobin: 14.6 g/dL (ref 12.0–15.0)
MCH: 29.2 pg (ref 26.0–34.0)
MCHC: 33 g/dL (ref 30.0–36.0)
MCV: 88.4 fL (ref 80.0–100.0)
Platelets: 237 10*3/uL (ref 150–400)
RBC: 5 MIL/uL (ref 3.87–5.11)
RDW: 13.4 % (ref 11.5–15.5)
WBC: 7.6 10*3/uL (ref 4.0–10.5)
nRBC: 0 % (ref 0.0–0.2)

## 2020-09-14 LAB — CBG MONITORING, ED
Glucose-Capillary: 87 mg/dL (ref 70–99)
Glucose-Capillary: 142 mg/dL — ABNORMAL HIGH (ref 70–99)
Glucose-Capillary: 151 mg/dL — ABNORMAL HIGH (ref 70–99)

## 2020-09-14 LAB — RESP PANEL BY RT-PCR (FLU A&B, COVID) ARPGX2
Influenza A by PCR: NEGATIVE
Influenza B by PCR: NEGATIVE
SARS Coronavirus 2 by RT PCR: NEGATIVE

## 2020-09-14 LAB — BASIC METABOLIC PANEL
Anion gap: 11 (ref 5–15)
BUN: 36 mg/dL — ABNORMAL HIGH (ref 8–23)
CO2: 24 mmol/L (ref 22–32)
Calcium: 10.1 mg/dL (ref 8.9–10.3)
Chloride: 102 mmol/L (ref 98–111)
Creatinine, Ser: 1.02 mg/dL — ABNORMAL HIGH (ref 0.44–1.00)
GFR, Estimated: 57 mL/min — ABNORMAL LOW (ref >60)
Glucose, Bld: 122 mg/dL — ABNORMAL HIGH (ref 70–99)
Potassium: 4.2 mmol/L (ref 3.5–5.1)
Sodium: 137 mmol/L (ref 135–145)

## 2020-09-14 LAB — TROPONIN I (HIGH SENSITIVITY)
Troponin I (High Sensitivity): 19 ng/L — ABNORMAL HIGH (ref <18)
Troponin I (High Sensitivity): 16 ng/L (ref <18)
Troponin I (High Sensitivity): 17 ng/L (ref <18)

## 2020-09-14 MED ORDER — ADULT MULTIVITAMIN W/MINERALS CH
1.0000 | ORAL_TABLET | Freq: Every day | ORAL | Status: DC
  Administered 2020-09-15: 1 via ORAL
  Filled 2020-09-14: qty 1

## 2020-09-14 MED ORDER — METOPROLOL SUCCINATE ER 50 MG PO TB24
25.0000 mg | ORAL_TABLET | Freq: Every day | ORAL | Status: DC
  Administered 2020-09-15: 25 mg via ORAL
  Filled 2020-09-14: qty 1

## 2020-09-14 MED ORDER — AMLODIPINE BESYLATE 5 MG PO TABS
10.0000 mg | ORAL_TABLET | Freq: Every day | ORAL | Status: DC
  Administered 2020-09-14 – 2020-09-15 (×2): 10 mg via ORAL
  Filled 2020-09-14 (×2): qty 2

## 2020-09-14 MED ORDER — ONDANSETRON HCL 4 MG/2ML IJ SOLN: 4.0000 mg | Freq: Four times a day (QID) | INTRAMUSCULAR | Status: DC | PRN

## 2020-09-14 MED ORDER — INSULIN ASPART 100 UNIT/ML ~~LOC~~ SOLN
0.0000 [IU] | Freq: Three times a day (TID) | SUBCUTANEOUS | Status: DC
  Administered 2020-09-15: 2 [IU] via SUBCUTANEOUS

## 2020-09-14 MED ORDER — FUROSEMIDE 20 MG PO TABS
20.0000 mg | ORAL_TABLET | Freq: Every day | ORAL | Status: DC
  Filled 2020-09-14: qty 1

## 2020-09-14 MED ORDER — GABAPENTIN 300 MG PO CAPS
300.0000 mg | ORAL_CAPSULE | Freq: Three times a day (TID) | ORAL | Status: DC
  Administered 2020-09-14 – 2020-09-15 (×3): 300 mg via ORAL
  Filled 2020-09-14 (×3): qty 1

## 2020-09-14 MED ORDER — BENAZEPRIL HCL 20 MG PO TABS
20.0000 mg | ORAL_TABLET | Freq: Every day | ORAL | Status: DC
  Filled 2020-09-14: qty 1

## 2020-09-14 MED ORDER — ROSUVASTATIN CALCIUM 20 MG PO TABS
10.0000 mg | ORAL_TABLET | Freq: Every day | ORAL | Status: DC
  Administered 2020-09-15: 10 mg via ORAL
  Filled 2020-09-14 (×2): qty 1

## 2020-09-14 MED ORDER — ACETAMINOPHEN 325 MG PO TABS
650.0000 mg | ORAL_TABLET | ORAL | Status: DC | PRN
  Administered 2020-09-14: 650 mg via ORAL
  Filled 2020-09-14: qty 2

## 2020-09-14 MED ORDER — ASPIRIN EC 81 MG PO TBEC
81.0000 mg | DELAYED_RELEASE_TABLET | Freq: Every day | ORAL | Status: DC
  Administered 2020-09-14 – 2020-09-15 (×2): 81 mg via ORAL
  Filled 2020-09-14 (×2): qty 1

## 2020-09-14 MED ORDER — VITAMIN D3 25 MCG (1000 UNIT) PO TABS
5000.0000 [IU] | ORAL_TABLET | Freq: Every day | ORAL | Status: DC
  Administered 2020-09-15: 5000 [IU] via ORAL
  Filled 2020-09-14 (×2): qty 5

## 2020-09-14 MED ORDER — PANTOPRAZOLE SODIUM 40 MG PO TBEC
40.0000 mg | DELAYED_RELEASE_TABLET | Freq: Every day | ORAL | Status: DC
  Administered 2020-09-14 – 2020-09-15 (×2): 40 mg via ORAL
  Filled 2020-09-14 (×2): qty 1

## 2020-09-14 MED ORDER — ENOXAPARIN SODIUM 40 MG/0.4ML ~~LOC~~ SOLN
40.0000 mg | SUBCUTANEOUS | Status: DC
  Administered 2020-09-14: 40 mg via SUBCUTANEOUS
  Filled 2020-09-14: qty 0.4

## 2020-09-14 MED ORDER — OXYCODONE HCL 5 MG PO TABS
5.0000 mg | ORAL_TABLET | Freq: Three times a day (TID) | ORAL | Status: DC | PRN
  Administered 2020-09-14 – 2020-09-15 (×3): 5 mg via ORAL
  Filled 2020-09-14 (×4): qty 1

## 2020-09-14 NOTE — H&P (Signed)
History and Physical    [name] FXT:[id number] DOB: Jul 11, 1946 DOA: 09/14/2020  PCP: Danella Penton, MD   Patient coming from: Home  I have personally briefly reviewed patient's old medical records in [facility]  Chief Complaint: Chest pain  HPI: [name] is a 74 y.o. female with medical history significant for type 2 diabetes mellitus, hypertension and dyslipidemia who presents to the ER via private vehicle for evaluation of chest pain.  Patient describes an episode of substernal pressure that occurred this morning while she was getting her head down and lasted about 30 to 40 minutes.  It was nonradiating but was associated with shortness of breath and diaphoresis.  She denies having any nausea, vomiting or palpitations.  Patient states that she has had episodes of chest pain in the past but not lasting as long as this episode. She is currently pain-free. She denies having any abdominal pain, no changes in her bowel habits, no fever, no chills, no urinary symptoms, no dizziness or lightheadedness. Labs show sodium 137, potassium 4.2, chloride 102, bicarb 24, glucose 122, BUN 36, creatinine 1.02, calcium 10.1, troponin 19 >> 16, white count 7.6, hemoglobin 14.6, hematocrit 44.2, MCV 88.4, RDW 13.4, platelet count 237 Chest x-ray reviewed by me shows no active cardiopulmonary disease Twelve-lead EKG reviewed by me shows sinus bradycardia with a left bundle branch block (I do not have any prior EKGs to compare with)  ED Course: Patient is a 74 year old Caucasian female with a history of diabetes mellitus and hypertension who presents to the emergency room for evaluation of substernal chest pain associated with shortness of breath and diaphoresis which has resolved at this time.  Twelve-lead EKG shows sinus bradycardia with a left bundle branch block (I have no EKG to compare this with).  Troponin is negative.  Patient will be referred to observation status for further evaluation and  risk factor stratification.  Review of Systems: As per HPI otherwise 10 point review of systems negative.    Past Medical History:  Diagnosis Date  . Diabetes mellitus without complication (HCC)   . Hyperlipidemia   . Hypertension     Past Surgical History:  Procedure Laterality Date  . endovascular laser N/A      reports that she has quit smoking. She has never used smokeless tobacco. She reports that she does not drink alcohol and does not use drugs.  No Known Allergies  Family History  Problem Relation Age of Onset  . Breast cancer Sister 47  . Breast cancer Maternal Aunt      Prior to Admission medications   Medication Sig Start Date End Date Taking? Authorizing Provider  diclofenac Sodium (VOLTAREN) 1 % GEL Apply 2 g topically in the morning, at noon, in the evening, and at bedtime. 03/13/20  Yes [provider]  amLODipine (NORVASC) 10 MG tablet Take 10 mg by mouth daily.    [provider]  aspirin EC 81 MG tablet Take 81 mg by mouth daily.    [provider]  celecoxib (CELEBREX) 200 MG capsule Take 200 mg by mouth daily.    [provider]  Cholecalciferol (VITAMIN D-3) 5000 units TABS Take 5,000 Units by mouth daily.    [provider]  cyanocobalamin (,VITAMIN B-12,) 1000 MCG/ML injection Inject into the muscle. 10/13/18   [provider]  etodolac (LODINE) 400 MG tablet Take 400 mg by mouth daily. 06/10/19   [provider]  gabapentin (NEURONTIN) 300 MG capsule  Take 300 mg by mouth 3 (three) times daily.     [provider]  glimepiride (AMARYL) 4 MG tablet Take 4 mg by mouth daily with breakfast. 06/09/19   [provider]  JARDIANCE 10 MG TABS tablet Take 10 mg by mouth daily. 07/11/20   [provider]  metFORMIN (GLUCOPHAGE-XR) 500 MG 24 hr tablet Take 1,000 mg by mouth every evening. 09/06/20   [provider]  metoprolol succinate (TOPROL-XL) 50 MG 24 hr tablet Take 50  mg by mouth daily. 10/01/16   [provider]  Multiple Vitamin (MULTI-VITAMIN) tablet Take 1 tablet by mouth daily.    [provider]  olmesartan (BENICAR) 40 MG tablet Take 40 mg by mouth daily. 06/10/19   [provider]  omeprazole (PRILOSEC) 20 MG capsule Take 20 mg by mouth daily.    [provider]  oxyCODONE (OXY IR/ROXICODONE) 5 MG immediate release tablet Take 5 mg by mouth every 8 (eight) hours as needed. 09/11/20   [provider]  rosuvastatin (CRESTOR) 10 MG tablet Take 10 mg by mouth daily.    [provider]  triamcinolone cream (KENALOG) 0.1 % Apply 1 application topically 2 (two) times daily. 08/11/19   [provider]    Physical Exam: Vitals:   09/14/20 1238 09/14/20 1441  BP: (!) 123/51 (!) 185/74  Pulse: (!) 57 (!) 56  Resp: 16 15  Temp: 98.4 F (36.9 C)   TempSrc: Oral   SpO2: 97% 97%     Vitals:   09/14/20 1238 09/14/20 1441  BP: (!) 123/51 (!) 185/74  Pulse: (!) 57 (!) 56  Resp: 16 15  Temp: 98.4 F (36.9 C)   TempSrc: Oral   SpO2: 97% 97%    Constitutional: NAD, alert and oriented x 3 Eyes: PERRL, lids and conjunctivae normal ENMT: Mucous membranes are moist.  Neck: normal, supple, no masses, no thyromegaly Respiratory: clear to auscultation bilaterally, no wheezing, no crackles. Normal respiratory effort. No accessory muscle use.  Cardiovascular: Bradycardia,no murmurs / rubs / gallops. No extremity edema. 2+ pedal pulses. No carotid bruits.  Abdomen: no tenderness, no masses palpated. No hepatosplenomegaly. Bowel sounds positive.  Central adiposity Musculoskeletal: no clubbing / cyanosis. No joint deformity upper and lower extremities.  Skin: no rashes, lesions, ulcers.  Neurologic: No gross focal neurologic deficit. Psychiatric: Normal mood and affect.   Labs on Admission: I have personally reviewed following labs and imaging studies  CBC: Recent Labs  Lab 09/14/20 1255  WBC  7.6  HGB 14.6  HCT 44.2  MCV 88.4  PLT 237   Basic Metabolic Panel: Recent Labs  Lab 09/14/20 1255  NA 137  K 4.2  CL 102  CO2 24  GLUCOSE 122*  BUN 36*  CREATININE 1.02*  CALCIUM 10.1   GFR: CrCl cannot be calculated (Unknown ideal weight.). Liver Function Tests: No results for input(s): AST, ALT, ALKPHOS, BILITOT, PROT, ALBUMIN in the last 168 hours. No results for input(s): LIPASE, AMYLASE in the last 168 hours. No results for input(s): AMMONIA in the last 168 hours. Coagulation Profile: No results for input(s): INR, PROTIME in the last 168 hours. Cardiac Enzymes: No results for input(s): CKTOTAL, CKMB, CKMBINDEX, TROPONINI in the last 168 hours. BNP (last 3 results) No results for input(s): PROBNP in the last 8760 hours. HbA1C: No results for input(s): HGBA1C in the last 72 hours. CBG: No results for input(s): GLUCAP in the last 168 hours. Lipid Profile: No results for input(s): CHOL, HDL,  LDLCALC, TRIG, CHOLHDL, LDLDIRECT in the last 72 hours. Thyroid Function Tests: No results for input(s): TSH, T4TOTAL, FREET4, T3FREE, THYROIDAB in the last 72 hours. Anemia Panel: No results for input(s): VITAMINB12, FOLATE, FERRITIN, TIBC, IRON, RETICCTPCT in the last 72 hours. Urine analysis:    Component Value Date/Time   COLORURINE YELLOW (A) 05/05/2018 1245   APPEARANCEUR CLEAR (A) 05/05/2018 1245   LABSPEC 1.009 05/05/2018 1245   PHURINE 5.0 05/05/2018 1245   GLUCOSEU NEGATIVE 05/05/2018 1245   HGBUR NEGATIVE 05/05/2018 1245   BILIRUBINUR NEGATIVE 05/05/2018 1245   KETONESUR NEGATIVE 05/05/2018 1245   PROTEINUR NEGATIVE 05/05/2018 1245   NITRITE NEGATIVE 05/05/2018 1245   LEUKOCYTESUR NEGATIVE 05/05/2018 1245    Radiological Exams on Admission: DG Chest 2 View  Result Date: 09/14/2020 CLINICAL DATA:  Mid sternal chest pain EXAM: CHEST - 2 VIEW COMPARISON:  None FINDINGS: The heart size and mediastinal contours are within normal limits. Both lungs are clear. The  visualized skeletal structures are unremarkable. Ordinary mild degenerative changes affect the spine. IMPRESSION: No active cardiopulmonary disease. Electronically Signed   By: Paulina Fusi M.D.   On: 09/14/2020 13:35    EKG: Independently reviewed.  Sinus bradycardia Left bundle branch block  Assessment/Plan Principal Problem:   Chest pain Active Problems:   Hypertension   Diabetes (HCC)     Chest pain In a patient with coronary artery disease equivalent Patient has ruled out for an acute coronary syndrome but presents for evaluation of high risk chest pain Continue aspirin, beta-blockers and statins Patient will need further risk factor stratification We will consult cardiology Obtain 2D echocardiogram to rule out regional wall motion abnormality    Diabetes mellitus Hold oral hypoglycemic agents Glycemic control with sliding scale coverage Accu-Cheks before meals and at bedtime    Hypertension Continue amlodipine, metoprolol and benazepril   DVT prophylaxis: Lovenox Code Status: Full code Family Communication: Greater than 50% of time was spent discussing plan of care with patient at the bedside.  All questions and concerns have been addressed.  She verbalizes understanding and agrees to the plan. Disposition Plan: Back to previous home environment Consults called: Cardiology    Claribel Sachs MD [facility]     09/14/2020, 3:19 PM

## 2020-09-14 NOTE — ED Provider Notes (Signed)
[facility] Emergency Department Provider Note   ____________________________________________   Event Date/Time   First MD Initiated Contact with Patient 09/14/20 1413     (approximate)  I have reviewed the triage vital signs and the nursing notes.   HISTORY  Chief Complaint Chest Pain    HPI [name] is a 74 y.o. female with a stated past medical history of type 2 diabetes, hypertension, and hyperlipidemia who presents for chest pain.  Patient describes an episode of substernal chest pressure that occurred approximately 2 hours prior to arrival and lasted 30-40 minutes before resolving spontaneously.  Patient states that she has had similar pains in the past when she has exerted herself including walking to her mailbox and back.  However, patient states that this has never happened at rest before.  Patient states last cardiac stress test and catheterization were over 10 years ago.  Patient has no acute complaints at this time.  Patient does endorse shortness of breath associated with this chest pain.         Past Medical History:  Diagnosis Date  . Diabetes mellitus without complication (HCC)   . Hyperlipidemia   . Hypertension     Patient Active Problem List   Diagnosis Date Noted  . Chest pain 09/14/2020  . Atrophy of skin and soft tissue 04/07/2019  . B12 deficiency 10/29/2018  . Medicare annual wellness visit, initial 10/29/2018  . Chronic ulcer of right leg, with unspecified severity (HCC) 10/09/2018  . Exertional angina (HCC) 10/09/2018  . Hyperlipidemia, mixed 10/09/2018  . Venous ulcer of lower leg without varicose veins (HCC)   . 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 Cellulitis of right leg 05/05/2018  . Essential hypertension, benign 10/22/2016  . Diabetes (HCC) 10/22/2016  . Chronic venous insufficiency 10/22/2016  . Swelling of limb 10/22/2016  . Pain in limb 10/22/2016    Past Surgical History:   Procedure Laterality Date  . endovascular laser N/A     Prior to Admission medications   Medication Sig Start Date End Date Taking? Authorizing Provider  amLODipine (NORVASC) 10 MG tablet Take 10 mg by mouth daily.    [provider]  aspirin EC 81 MG tablet Take 81 mg by mouth daily.    [provider]  benazepril (LOTENSIN) 20 MG tablet Take 20 mg by mouth daily. 07/29/16   [provider]  celecoxib (CELEBREX) 200 MG capsule Take 200 mg by mouth daily.    [provider]  Cholecalciferol (VITAMIN D-3) 5000 units TABS Take 5,000 Units by mouth daily.    [provider]  cyanocobalamin (,VITAMIN B-12,) 1000 MCG/ML injection Inject into the muscle. 10/13/18   [provider]  etodolac (LODINE) 400 MG tablet TAKE 1 TABLET BY MOUTH DAILY 06/10/19   [provider]  furosemide (LASIX) 20 MG tablet daily.  06/09/19   [provider]  gabapentin (NEURONTIN) 300 MG capsule Take 300 mg by mouth 3 (three) times daily.     [provider]  glimepiride (AMARYL) 4 MG tablet  06/09/19   [provider]  glipiZIDE (GLUCOTROL XL) 5 MG 24 hr tablet Take 5 mg by mouth 2 (two) times daily. 10/15/16   [provider]  hydrochlorothiazide (HYDRODIURIL) 25 MG tablet Take 25 mg by mouth 2 (two) times daily. 10/15/16   [provider]  HYDROcodone-acetaminophen (NORCO/VICODIN) 5-325 MG tablet  05/07/19   [provider]  hydrocortisone 2.5 % cream Apply  topically 2 (two) times daily. Patient not taking: Reported on 08/24/2019 10/31/16   Vivi Barrack, DPM  KOMBIGLYZE XR 2.02-999 MG TB24 TAKE 1 TABLET 2 TIMES A DAY ORALLY 10/16/16   [provider]  metFORMIN (GLUCOPHAGE) 1000 MG tablet  06/09/19   [provider]  metoprolol succinate (TOPROL-XL) 50 MG 24 hr tablet  10/01/16   [provider]  Multiple Vitamin (MULTI-VITAMIN) tablet Take by mouth.    [provider]   olmesartan (BENICAR) 40 MG tablet TAKE ONE TABLET BY MOUTH EVERY DAY 06/10/19   [provider]  omeprazole (PRILOSEC) 20 MG capsule Take by mouth.    [provider]  rosuvastatin (CRESTOR) 10 MG tablet Take 10 mg by mouth daily.    [provider]  triamcinolone cream (KENALOG) 0.1 % APPLY TO AFFECTED AREAS TWICE DAILY 08/11/19   [provider]    Allergies Patient has no known allergies.  Family History  Problem Relation Age of Onset  . Breast cancer Sister 50  . Breast cancer Maternal Aunt     Social History Social History   Tobacco Use  . Smoking status: Former Games developer  . Smokeless tobacco: Never Used  Vaping Use  . Vaping Use: Never used  Substance Use Topics  . Alcohol use: No  . Drug use: No    Review of Systems Constitutional: No fever/chills Eyes: No visual changes. ENT: No sore throat. Cardiovascular: Endorses chest pain. Respiratory: Endorses shortness of breath. Gastrointestinal: No abdominal pain.  No nausea, no vomiting.  No diarrhea. Genitourinary: Negative for dysuria. Musculoskeletal: Negative for acute arthralgias Skin: Negative for rash. Neurological: Negative for headaches, weakness/numbness/paresthesias in any extremity Psychiatric: Negative for suicidal ideation/homicidal ideation   ____________________________________________   PHYSICAL EXAM:  VITAL SIGNS: ED Triage Vitals  Enc Vitals Group     BP 09/14/20 1238 (!) 123/51     Pulse Rate 09/14/20 1238 (!) 57     Resp 09/14/20 1238 16     Temp 09/14/20 1238 98.4 F (36.9 C)     Temp Source 09/14/20 1238 Oral     SpO2 09/14/20 1238 97 %     Weight --      Height --      Head Circumference --      Peak Flow --      Pain Score 09/14/20 1244 0     Pain Loc --      Pain Edu? --      Excl. in GC? --    Constitutional: Alert and oriented. Well appearing and in no acute distress. Eyes: Conjunctivae are normal. PERRL. Head: Atraumatic. Nose: No  congestion/rhinnorhea. Mouth/Throat: Mucous membranes are moist. Neck: No stridor Cardiovascular: Grossly normal heart sounds.  Good peripheral circulation. Respiratory: Normal respiratory effort.  No retractions. Gastrointestinal: Soft and nontender. No distention. Musculoskeletal: No obvious deformities Neurologic:  Normal speech and language. No gross focal neurologic deficits are appreciated. Skin:  Skin is warm and dry. No rash noted. Psychiatric: Mood and affect are normal. Speech and behavior are normal.  ____________________________________________   LABS (all labs ordered are listed, but only abnormal results are displayed)  Labs Reviewed  BASIC METABOLIC PANEL - Abnormal; Notable for the following components:      Result Value   Glucose, Bld 122 (*)    BUN 36 (*)    Creatinine, Ser 1.02 (*)    GFR, Estimated 57 (*)    All other components within normal limits  TROPONIN I (HIGH SENSITIVITY) -  Abnormal; Notable for the following components:   Troponin I (High Sensitivity) 19 (*)    All other components within normal limits  RESP PANEL BY RT-PCR (FLU A&B, COVID) ARPGX2  CBC  HEMOGLOBIN A1C  TROPONIN I (HIGH SENSITIVITY)  TROPONIN I (HIGH SENSITIVITY)   ____________________________________________  EKG  ED ECG REPORT I, Merwyn Katos, the attending physician, personally viewed and interpreted this ECG.  Date: 09/14/2020 EKG Time: 1235 Rate: 58 Rhythm: normal sinus rhythm QRS Axis: normal Intervals: normal ST/T Wave abnormalities: normal Narrative Interpretation: no evidence of acute ischemia  ____________________________________________  RADIOLOGY  ED MD interpretation: 2 view x-ray of the chest shows no evidence of acute abnormalities including no pneumonia, pneumothorax, or widened mediastinum  Official radiology report(s): DG Chest 2 View  Result Date: 09/14/2020 CLINICAL DATA:  Mid sternal chest pain EXAM: CHEST - 2 VIEW COMPARISON:  None FINDINGS:  The heart size and mediastinal contours are within normal limits. Both lungs are clear. The visualized skeletal structures are unremarkable. Ordinary mild degenerative changes affect the spine. IMPRESSION: No active cardiopulmonary disease. Electronically Signed   By: Paulina Fusi M.D.   On: 09/14/2020 13:35    ____________________________________________   PROCEDURES  Procedure(s) performed (including Critical Care):  .1-3 Lead EKG Interpretation Performed by: Merwyn Katos, MD Authorized by: Merwyn Katos, MD     Interpretation: normal     ECG rate:  61   ECG rate assessment: normal     Rhythm: sinus rhythm     Ectopy: none     Conduction: normal       ____________________________________________   INITIAL IMPRESSION / ASSESSMENT AND PLAN / ED COURSE  As part of my medical decision making, I reviewed the following data within the electronic MEDICAL RECORD NUMBER Nursing notes reviewed and incorporated, Labs reviewed, EKG interpreted, Old chart reviewed, Radiograph reviewed and Notes from prior ED visits reviewed and incorporated        Workup: ECG, CXR, CBC, BMP, Troponin Findings: ECG: No overt evidence of STEMI. No evidence of Brugadas sign, delta wave, epsilon wave, significantly prolonged QTc, or malignant arrhythmia HS Troponin: Negative x1 Other Labs unremarkable for emergent problems. CXR: Without PTX, PNA, or widened mediastinum Last Stress Test: 10 years prior to arrival Last Heart Catheterization: 10 years prior to arrival HEART Score: 5  Given History, Exam, and Workup I have low suspicion for ACS, Pneumothorax, Pneumonia, Pulmonary Embolus, Tamponade, Aortic Dissection or other emergent problem as a cause for this presentation.   Reassesment: Prior to discharge patients pain was controlled and they were well appearing.  High Risk Chest Pain Patient at increased risk for Major Adverse Cardiac Event (AMI, PCI, CABG, death) Interventions: ASA  324mg  Defer Heparin drip as patient pain free at this time,   Disposition: Admit for continued cardiac monitoring and trending of troponins as well as further evaluation for potential inpatient stress testing vs cardiac catheterization and coronary angiography.      ____________________________________________   FINAL CLINICAL IMPRESSION(S) / ED DIAGNOSES  Final diagnoses:  Chest pain with high risk for cardiac etiology     ED Discharge Orders    None       Note:  This document was prepared using Dragon voice recognition software and may include unintentional dictation errors.   , MD 09/14/20 [phone]

## 2020-09-14 NOTE — ED Notes (Signed)
Patient denies pain and is resting comfortably.  

## 2020-09-14 NOTE — ED Triage Notes (Signed)
Pt via pov from home with chest pain that began around 11AM, which has resolved. Pt states she has had similar symptoms in the past, but not this bad. She reports that she has had 3 heart caths over many years, with no significant findings. She states it has been worse with exertion (historically) but that today it was just worse. It lasted about 30-40 minutes and has now resolved. Pt alert & oriented, nad noted.

## 2020-09-15 ENCOUNTER — Observation Stay (HOSPITAL_BASED_OUTPATIENT_CLINIC_OR_DEPARTMENT_OTHER)
Admit: 2020-09-15 | Discharge: 2020-09-15 | Disposition: A | Discharge status: Home / Self Care | Payer: Medicare HMO | Attending: Cardiovascular Disease | Admitting: Cardiovascular Disease

## 2020-09-15 ENCOUNTER — Encounter: Payer: Self-pay | Admitting: Internal Medicine

## 2020-09-15 ENCOUNTER — Observation Stay (HOSPITAL_BASED_OUTPATIENT_CLINIC_OR_DEPARTMENT_OTHER): Payer: Medicare HMO

## 2020-09-15 DIAGNOSIS — I447 Left bundle-branch block, unspecified: Secondary | ICD-10-CM

## 2020-09-15 DIAGNOSIS — E782 Mixed hyperlipidemia: Secondary | ICD-10-CM

## 2020-09-15 DIAGNOSIS — I340 Nonrheumatic mitral (valve) insufficiency: Secondary | ICD-10-CM

## 2020-09-15 DIAGNOSIS — I10 Essential (primary) hypertension: Secondary | ICD-10-CM | POA: Diagnosis not present

## 2020-09-15 DIAGNOSIS — R7881 Bacteremia: Secondary | ICD-10-CM

## 2020-09-15 DIAGNOSIS — R079 Chest pain, unspecified: Secondary | ICD-10-CM

## 2020-09-15 DIAGNOSIS — I700 Atherosclerosis of aorta: Secondary | ICD-10-CM | POA: Diagnosis not present

## 2020-09-15 DIAGNOSIS — I371 Nonrheumatic pulmonary valve insufficiency: Secondary | ICD-10-CM

## 2020-09-15 DIAGNOSIS — I200 Unstable angina: Secondary | ICD-10-CM | POA: Diagnosis not present

## 2020-09-15 DIAGNOSIS — I2510 Atherosclerotic heart disease of native coronary artery without angina pectoris: Secondary | ICD-10-CM | POA: Diagnosis not present

## 2020-09-15 DIAGNOSIS — E1159 Type 2 diabetes mellitus with other circulatory complications: Secondary | ICD-10-CM

## 2020-09-15 DIAGNOSIS — I5189 Other ill-defined heart diseases: Secondary | ICD-10-CM

## 2020-09-15 LAB — NM MYOCAR MULTI W/SPECT W/WALL MOTION / EF
Estimated workload: 1 METS
Exercise duration (min): 0 min
Exercise duration (sec): 0 s
LV dias vol: 64 mL (ref 46–106)
LV sys vol: 25 mL
MPHR: 144 {beats}/min
Peak HR: 87 {beats}/min
Percent HR: 60 %
Rest HR: 63 {beats}/min
SDS: 0
SRS: 11
SSS: 5
TID: 0.89

## 2020-09-15 LAB — LIPID PANEL
Cholesterol: 122 mg/dL (ref 0–200)
HDL: 49 mg/dL (ref >40)
LDL Cholesterol: 43 mg/dL (ref 0–99)
Total CHOL/HDL Ratio: 2.5 RATIO
Triglycerides: 151 mg/dL — ABNORMAL HIGH (ref <150)
VLDL: 30 mg/dL (ref 0–40)

## 2020-09-15 LAB — ECHOCARDIOGRAM COMPLETE
AR max vel: 2.65 cm2
AV Area VTI: 2.76 cm2
AV Area mean vel: 2.86 cm2
AV Mean grad: 5 mmHg
AV Peak grad: 9.2 mmHg
Ao pk vel: 1.52 m/s
Area-P 1/2: 5.62 cm2
S' Lateral: 2.36 cm

## 2020-09-15 LAB — HEMOGLOBIN A1C
Hgb A1c MFr Bld: 8.4 % — ABNORMAL HIGH (ref 4.8–5.6)
Mean Plasma Glucose: 194.38 mg/dL

## 2020-09-15 LAB — CBG MONITORING, ED: Glucose-Capillary: 132 mg/dL — ABNORMAL HIGH (ref 70–99)

## 2020-09-15 LAB — TSH: TSH: 1.267 u[IU]/mL (ref 0.350–4.500)

## 2020-09-15 MED ORDER — IRBESARTAN 150 MG PO TABS
300.0000 mg | ORAL_TABLET | Freq: Every day | ORAL | Status: DC
  Filled 2020-09-15: qty 2

## 2020-09-15 MED ORDER — PERFLUTREN LIPID MICROSPHERE
1.0000 mL | INTRAVENOUS | Status: AC | PRN
  Administered 2020-09-15: 2 mL via INTRAVENOUS
  Filled 2020-09-15: qty 10

## 2020-09-15 MED ORDER — HYDROCHLOROTHIAZIDE 25 MG PO TABS
25.0000 mg | ORAL_TABLET | Freq: Every day | ORAL | Status: DC
  Administered 2020-09-15: 25 mg via ORAL
  Filled 2020-09-15: qty 1

## 2020-09-15 MED ORDER — REGADENOSON 0.4 MG/5ML IV SOLN
0.4000 mg | Freq: Once | INTRAVENOUS | Status: AC
  Administered 2020-09-15: 0.4 mg via INTRAVENOUS
  Filled 2020-09-15: qty 5

## 2020-09-15 MED ORDER — TECHNETIUM TC 99M TETROFOSMIN IV KIT
10.0000 | PACK | Freq: Once | INTRAVENOUS | Status: AC | PRN
  Administered 2020-09-15: 9.86 via INTRAVENOUS
  Filled 2020-09-15: qty 10

## 2020-09-15 MED ORDER — NITROGLYCERIN 0.4 MG SL SUBL: 0.4000 mg | SUBLINGUAL_TABLET | SUBLINGUAL | 3 refills | Status: DC | PRN

## 2020-09-15 MED ORDER — TECHNETIUM TC 99M TETROFOSMIN IV KIT
30.6600 | PACK | Freq: Once | INTRAVENOUS | Status: AC | PRN
  Administered 2020-09-15: 30.66 via INTRAVENOUS
  Filled 2020-09-15: qty 31

## 2020-09-15 NOTE — Consult Note (Signed)
Cardiology Consult    Patient ID: [name] MRN: [id number], DOB/AGE: [birth year]   Admit date: 09/14/2020 Date of Consult: 09/15/2020  Primary Physician: Danella Penton, MD Primary Cardiologist: Lorine Bears, MD Requesting Provider: Alfonso Patten, MD  Patient Profile    [name] is a 74 y.o. female with a history of chest pain an reported nl cath in 2008, HTN, HL, DM, LBBB, and remote tobacco abuse, who is being seen today for the evaluation of chest pain at the request of Dr. Ashok Pall.  Past Medical History   Past Medical History:  Diagnosis Date  . Chest pain    a. reports multiple nl stress tests and nl cath in 2008.  . Diabetes mellitus without complication (HCC)   . History of echocardiogram    a. 07/2013 Echo: EF 50-55%. No rwma. Gr1 DD. Abnl septal motion consistent w/ LBBB. Mild MR/TR.  [name] Kitchen Hyperlipidemia   . Hypertension   . LBBB (left bundle branch block)     Past Surgical History:  Procedure Laterality Date  . endovascular laser N/A      Allergies  No Known Allergies  History of Present Illness    74 y/o ? w/ the above PMH including chest pain, HTN, HL, DMII, LBBB, and remote tobacco abuse.  She has had intermittent exertional c/p and tightness associated w/ DOE dating back approx 20 yrs and has had at least three stress tests over that time period. In 2008, in the setting of ongoing Ss, she was eval by Dr. Kirke Corin and underwent diagnostic catheterization, which she says was normal (results not currently available).  Echo in 2014 showed EF of 50-55% w/ mild MR/TR, and abnormal septal motion consistent w/ LBBB.  Over the years, she has slowed her activity, but cont to walk about 1/3 mile per day.  She has continued to have occasional exertional chest tightness/fullness, sometimes assoc w/ DOE, usually when feeling hurried or walking at a faster pace/greater incline, occurring about once/months, lasting ~ 5-10 mins, and resolving w/ rest.  Overall, symptoms are less  frequent now than they were 5-10 years ago, but she notes her activity level has decreased as well.  She was in her [place] on 12/9 when she was fixing her hair and had sudden onset of 8/10 sscp/tightness/fullness radiating to her neck, w/o assoc Ss.  She sat and Ss passed in 10 mins.  Her dtr came to her home to help decorate and upon mentioning what had occurred, her dtr advised her to seek medical care.  She presented to the ED where ECG showed sinus rhythm w/ LBBB.  Initial HsTrop was minimally elevated @ 19, but subsequent were nl @ 16  17.  She has had no recurrence of c/p since the episode that occurred @ home yesterday.    Inpatient Medications    . amLODipine  10 mg Oral Daily  . aspirin EC  81 mg Oral Daily  . benazepril  20 mg Oral Daily  . cholecalciferol  5,000 Units Oral Daily  . enoxaparin (LOVENOX) injection  40 mg Subcutaneous Q24H  . gabapentin  300 mg Oral TID  . hydrochlorothiazide  25 mg Oral Daily  . insulin aspart  0-15 Units Subcutaneous TID WC  . irbesartan  300 mg Oral Daily  . metoprolol succinate  25 mg Oral Daily  . multivitamin with minerals  1 tablet Oral Daily  . pantoprazole  40 mg Oral Daily  . regadenoson  0.4 mg Intravenous Once  .  rosuvastatin  10 mg Oral Daily    Family History    Family History  Problem Relation Age of Onset  . Liver cancer Sister        died @ 34  . Breast cancer Maternal Aunt   . Dementia Mother        died @ 36  . Heart attack Father        died @ 23  . Celiac disease Brother        alive @ 61   She indicated that her mother is deceased. She indicated that her father is deceased. She indicated that her sister is deceased. She indicated that her brother is alive. She indicated that the status of her maternal aunt is unknown.   Social History    Social History   Socioeconomic History  . Marital status: Married    Spouse name: Not on file  . Number of children: Not on file  . Years of education: Not on file  .  Highest education level: Not on file  Occupational History  . Not on file  Tobacco Use  . Smoking status: Former Smoker    Packs/day: 0.50    Years: 40.00    Pack years: 20.00    Quit date: 10/07/2018    Years since quitting: 1.9  . Smokeless tobacco: Never Used  Vaping Use  . Vaping Use: Never used  Substance and Sexual Activity  . Alcohol use: No  . Drug use: No  . Sexual activity: Not on file  Other Topics Concern  . Not on file  Social History Narrative   Lives outside of [place] w/ husband.  Walks 1/3 mile/day.   Social Determinants of Health   Financial Resource Strain: Not on file  Food Insecurity: Not on file  Transportation Needs: Not on file  Physical Activity: Not on file  Stress: Not on file  Social Connections: Not on file  Intimate Partner Violence: Not on file     Review of Systems    General:  No chills, fever, night sweats or weight changes.  Cardiovascular:  +++ occas exertional chest pain/fullness/tightness, +++ dyspnea on exertion, no edema, orthopnea, palpitations, paroxysmal nocturnal dyspnea. Dermatological: No rash, lesions/masses Respiratory: No cough, +++ dyspnea on exertion. Urologic: No hematuria, dysuria Abdominal:   No nausea, vomiting, diarrhea, bright red blood per rectum, melena, or hematemesis Neurologic:  No visual changes, wkns, changes in mental status. All other systems reviewed and are otherwise negative except as noted above.  Physical Exam    Blood pressure (!) 163/79, pulse 61, temperature 98.5 F (36.9 C), temperature source Oral, resp. rate 16, SpO2 96 %.  General: Pleasant, NAD Psych: Normal affect. Neuro: Alert and oriented X 3. Moves all extremities spontaneously. HEENT: Normal  Neck: Supple without bruits or JVD. Lungs:  Resp regular and unlabored, CTA. Heart: RRR no s3, s4, or murmurs. Abdomen: Soft, non-tender, non-distended, BS + x 4.  Extremities: No clubbing, cyanosis or edema. DP/PT2+, Radials 2+ and equal  bilaterally.  Labs    Cardiac Enzymes Recent Labs  Lab 09/14/20 1255 09/14/20 1435 09/14/20 1444  TROPONINIHS 19* 16 17      Lab Results  Component Value Date   WBC 7.6 09/14/2020   HGB 14.6 09/14/2020   HCT 44.2 09/14/2020   MCV 88.4 09/14/2020   PLT 237 09/14/2020    Recent Labs  Lab 09/14/20 1255  NA 137  K 4.2  CL 102  CO2 24  BUN 36*  CREATININE 1.02*  CALCIUM 10.1  GLUCOSE 122*   Lab Results  Component Value Date   CHOL 122 09/14/2020   HDL 49 09/14/2020   LDLCALC 43 09/14/2020   TRIG 151 (H) 09/14/2020    Radiology Studies    DG Chest 2 View  Result Date: 09/14/2020 CLINICAL DATA:  Mid sternal chest pain EXAM: CHEST - 2 VIEW COMPARISON:  None FINDINGS: The heart size and mediastinal contours are within normal limits. Both lungs are clear. The visualized skeletal structures are unremarkable. Ordinary mild degenerative changes affect the spine. IMPRESSION: No active cardiopulmonary disease. Electronically Signed   By: Paulina Fusi M.D.   On: 09/14/2020 13:35    ECG & Cardiac Imaging    Sinus bradycardia, 58, LBBB, no acute ST/T changes - personally reviewed.  Assessment & Plan    1.  Midsternal Chest Pain:  Pt w/ a long h/o (~ 20 yrs) of intermittent exertional c/p and dyspnea w/ 3 prior reportedly nl stress tests and cath in 2008, which was also reportedly nl.  She has cont to have intermittent exertional c/p, occurring about once/month, usually @ higher levels of activity or when feeling rushed.  On 12/9, she had recurrent c/p while fixing her hair which radiated to her neck, lasted about 10 mins, and resolved spontaneously.  In ED, initial HsTrop was 19 but subsequent have been nl (16  17).  She has not any recurrent c/p since arrival.  No events on tele.  We discussed options for cardiac/ischemic evaluation today.  The benefits (risk stratification, diagnosing coronary artery disease, treatment guidance) and risks [chest pain, dyspnea, cardiac  arrhythmias, dizziness, low blood pressure, allergic reaction, heart attack, and life-threatening complications (estimated to be 1 in 10,000)] were discussed in detail with [name] and she agrees to proceed.  Further, we discussed that if stress testing is nl but that she continues to have symptoms, we may consider further outpt imaging such as coronary CTA vs. Cath.  Cont asa,  blocker, statin.  2.  Essential HTN:  BP elevated. Follow after AM meds and adjust if necessary.  3. HL:  LDL 43.  Cont statin rx.  4. DMII:  SSI per IM.  5.  LBBB:  Chronic.  Signed, Nicolasa Ducking, NP 09/15/2020, 11:22 AM  For questions or updates, please contact   Please consult [email or web address] for contact info under Cardiology/STEMI.

## 2020-09-15 NOTE — ED Notes (Signed)
Took over care of pt. Pt denies CP at this time. Pt given warm blanket. Pt in NAD at this time. VSS. Awaiting further orders. Will continue to monitor.

## 2020-09-15 NOTE — Progress Notes (Signed)
Pt and daughter, received d/c instructions, both verbalized understanding of instructions. PIV(s) 20g removed from (R) Arm and (L) Arm; catheter intact. Pt walked off unit in stable condition.

## 2020-09-15 NOTE — Progress Notes (Signed)
*  PRELIMINARY RESULTS* Echocardiogram 2D Echocardiogram has been performed.  [name] 09/15/2020, 12:59 PM

## 2020-09-15 NOTE — Progress Notes (Signed)
PROGRESS NOTE    [name]  YQI:[id number] DOB: 1946-02-06 DOA: 09/14/2020 PCP: Danella Penton, MD  Outpatient Specialists: previously patient of dr. Dario Guardian, cardiology    Brief Narrative:   [name] is a 74 y.o. female with medical history significant for type 2 diabetes mellitus, hypertension and dyslipidemia who presents to the ER via private vehicle for evaluation of chest pain.  Patient describes an episode of substernal pressure that occurred this morning while she was getting her head down and lasted about 30 to 40 minutes.  It was nonradiating but was associated with shortness of breath and diaphoresis.  She denies having any nausea, vomiting or palpitations.  Patient states that she has had episodes of chest pain in the past but not lasting as long as this episode. She is currently pain-free. She denies having any abdominal pain, no changes in her bowel habits, no fever, no chills, no urinary symptoms, no dizziness or lightheadedness.   Assessment & Plan:   Principal Problem:   Chest pain Active Problems:   Hypertension   Diabetes (HCC)   # Chest pain History of exertional chest pain with SOB worked up in the past with stress test x2 and cardiac cath, patient thinks the last of this w/u was about 10 years ago. Yesterday while doing her hair had a 5 minute episode of intense epigastric/substernal chest pressure that resolved spontaneously. Currently chest pain free. No cough or dyspnea or pleuritic pain. No vomiting or dysphagia. Denies drug/alcohol use. LBB seen on ekg, no priors available for comparison. Troponins negative x3. Received aspirin. - cont home metoprolol, aspirin, rosuvastatin - cardiology consulted, tentative plan for stress test today - echocardiogram has also been ordered - a1c 8.4, will check lipids and tsh  # T2 Diabetes mellitus A1c 8.4, glucose mid 100s here - hold home orals, start SSI  # Hypertension] Here bp elevated, hasn't yet had all  home meds - Continue amlodipine, metoprolol, hctz, and irbesartan for home olmesartan  # Chronic pain - cont home gabapentin, oxycodone   DVT prophylaxis: lovenox Code Status: full Family Communication: none @ bedside  Status is: Observation  The patient remains OBS appropriate and will d/c before 2 midnights.  Dispo: The patient is from: Home              Anticipated d/c is to: Home              Anticipated d/c date is: 1 day              Patient currently is not medically stable to d/c.        Consultants:  cardiology  Procedures: None   Antimicrobials:  none    Subjective: No chest pain, no sob, no abd pain, no n/v. No fevers or cough.   Objective: Vitals:   09/15/20 0200 09/15/20 0300 09/15/20 0400 09/15/20 0600  BP: (!) 158/71 (!) 155/59 (!) 149/63 (!) 163/79  Pulse: 63 (!) 58 61 61  Resp: 18 17 18 16   Temp:      TempSrc:      SpO2: 95% 92% 93% 96%   No intake or output data in the 24 hours ending 09/15/20 0805 There were no vitals filed for this visit.  Examination:  Constitutional: NAD, alert and oriented x 3 ENMT: Mucous membranes are moist.  Respiratory: clear to auscultation bilaterally, no wheezing, no crackles. Normal respiratory effort. No accessory muscle use.  Cardiovascular: Bradycardia,no murmurs / rubs / gallops. No extremity  edema. Trace pedal edema Abdomen: no tenderness, no masses palpated. No hepatosplenomegaly. Bowel sounds positive.  Central adiposity Musculoskeletal: no clubbing / cyanosis. No joint deformity upper and lower extremities.  Skin: no rashes, lesions, ulcers.  Neurologic: No gross focal neurologic deficit. Psychiatric: Normal mood and affect.    Data Reviewed: I have personally reviewed following labs and imaging studies  CBC: Recent Labs  Lab 09/14/20 1255  WBC 7.6  HGB 14.6  HCT 44.2  MCV 88.4  PLT 237   Basic Metabolic Panel: Recent Labs  Lab 09/14/20 1255  NA 137  K 4.2  CL 102  CO2 24   GLUCOSE 122*  BUN 36*  CREATININE 1.02*  CALCIUM 10.1   GFR: CrCl cannot be calculated (Unknown ideal weight.). Liver Function Tests: No results for input(s): AST, ALT, ALKPHOS, BILITOT, PROT, ALBUMIN in the last 168 hours. No results for input(s): LIPASE, AMYLASE in the last 168 hours. No results for input(s): AMMONIA in the last 168 hours. Coagulation Profile: No results for input(s): INR, PROTIME in the last 168 hours. Cardiac Enzymes: No results for input(s): CKTOTAL, CKMB, CKMBINDEX, TROPONINI in the last 168 hours. BNP (last 3 results) No results for input(s): PROBNP in the last 8760 hours. HbA1C: Recent Labs    09/14/20 1846  HGBA1C 8.4*   CBG: Recent Labs  Lab 09/14/20 1621 09/14/20 1736 09/14/20 2220  GLUCAP 87 142* 151*   Lipid Profile: No results for input(s): CHOL, HDL, LDLCALC, TRIG, CHOLHDL, LDLDIRECT in the last 72 hours. Thyroid Function Tests: No results for input(s): TSH, T4TOTAL, FREET4, T3FREE, THYROIDAB in the last 72 hours. Anemia Panel: No results for input(s): VITAMINB12, FOLATE, FERRITIN, TIBC, IRON, RETICCTPCT in the last 72 hours. Urine analysis:    Component Value Date/Time   COLORURINE YELLOW (A) 05/05/2018 1245   APPEARANCEUR CLEAR (A) 05/05/2018 1245   LABSPEC 1.009 05/05/2018 1245   PHURINE 5.0 05/05/2018 1245   GLUCOSEU NEGATIVE 05/05/2018 1245   HGBUR NEGATIVE 05/05/2018 1245   BILIRUBINUR NEGATIVE 05/05/2018 1245   KETONESUR NEGATIVE 05/05/2018 1245   PROTEINUR NEGATIVE 05/05/2018 1245   NITRITE NEGATIVE 05/05/2018 1245   LEUKOCYTESUR NEGATIVE 05/05/2018 1245   Sepsis Labs: @LABRCNTIP (procalcitonin:4,lacticidven:4)  ) Recent Results (from the past 240 hour(s))  Resp Panel by RT-PCR (Flu A&B, Covid) Nasopharyngeal Swab     Status: None   Collection Time: 09/14/20  2:35 PM   Specimen: Nasopharyngeal Swab; Nasopharyngeal(NP) swabs in vial transport medium  Result Value Ref Range Status   SARS Coronavirus 2 by RT PCR  NEGATIVE NEGATIVE Final    Comment: (NOTE) SARS-CoV-2 target nucleic acids are NOT DETECTED.  The SARS-CoV-2 RNA is generally detectable in upper respiratory specimens during the acute phase of infection. The lowest concentration of SARS-CoV-2 viral copies this assay can detect is 138 copies/mL. A negative result does not preclude SARS-Cov-2 infection and should not be used as the sole basis for treatment or other patient management decisions. A negative result may occur with  improper specimen collection/handling, submission of specimen other than nasopharyngeal swab, presence of viral mutation(s) within the areas targeted by this assay, and inadequate number of viral copies(<138 copies/mL). A negative result must be combined with clinical observations, patient history, and epidemiological information. The expected result is Negative.  Fact Sheet for Patients:  14/09/21  Fact Sheet for Healthcare Providers:  BloggerCourse.com  This test is no t yet approved or cleared by the [place] FDA and  has been authorized for detection and/or diagnosis of SARS-CoV-2 by  FDA under an Emergency Use Authorization (EUA). This EUA will remain  in effect (meaning this test can be used) for the duration of the COVID-19 declaration under Section 564(b)(1) of the Act, 21 U.S.C.section 360bbb-3(b)(1), unless the authorization is terminated  or revoked sooner.       Influenza A by PCR NEGATIVE NEGATIVE Final   Influenza B by PCR NEGATIVE NEGATIVE Final    Comment: (NOTE) The Xpert Xpress SARS-CoV-2/FLU/RSV plus assay is intended as an aid in the diagnosis of influenza from Nasopharyngeal swab specimens and should not be used as a sole basis for treatment. Nasal washings and aspirates are unacceptable for Xpert Xpress SARS-CoV-2/FLU/RSV testing.  Fact Sheet for Patients: BloggerCourse.com  Fact Sheet for  Healthcare Providers: SeriousBroker.it  This test is not yet approved or cleared by the [place] FDA and has been authorized for detection and/or diagnosis of SARS-CoV-2 by FDA under an Emergency Use Authorization (EUA). This EUA will remain in effect (meaning this test can be used) for the duration of the COVID-19 declaration under Section 564(b)(1) of the Act, 21 U.S.C. section 360bbb-3(b)(1), unless the authorization is terminated or revoked.  Performed at [facility], [address] [place], [place] [address]          Radiology Studies: DG Chest 2 View  Result Date: 09/14/2020 CLINICAL DATA:  Mid sternal chest pain EXAM: CHEST - 2 VIEW COMPARISON:  None FINDINGS: The heart size and mediastinal contours are within normal limits. Both lungs are clear. The visualized skeletal structures are unremarkable. Ordinary mild degenerative changes affect the spine. IMPRESSION: No active cardiopulmonary disease. Electronically Signed   By: Paulina Fusi M.D.   On: 09/14/2020 13:35        Scheduled Meds: . amLODipine  10 mg Oral Daily  . aspirin EC  81 mg Oral Daily  . benazepril  20 mg Oral Daily  . cholecalciferol  5,000 Units Oral Daily  . enoxaparin (LOVENOX) injection  40 mg Subcutaneous Q24H  . furosemide  20 mg Oral Daily  . gabapentin  300 mg Oral TID  . insulin aspart  0-15 Units Subcutaneous TID WC  . metoprolol succinate  25 mg Oral Daily  . multivitamin with minerals  1 tablet Oral Daily  . pantoprazole  40 mg Oral Daily  . rosuvastatin  10 mg Oral Daily   Continuous Infusions:   LOS: 0 days    Time spent: 40 min    Silvano Bilis, MD [facility]   If 7PM-7AM, please contact night-coverage [email or web address] Password [email or web address] 09/15/2020, 8:05 AM

## 2020-09-15 NOTE — Discharge Summary (Signed)
[name] TMH:[id number] DOB: 1946-09-26 DOA: 09/14/2020  PCP: Danella Penton, MD  Admit date: 09/14/2020 Discharge date: 09/15/2020  Time spent: 35 minutes  Recommendations for Outpatient Follow-up:  1. Outpatient pcp f/u 2. Cardiology f/u as needed (referral placed)     Discharge Diagnoses:  Principal Problem:   Chest pain Active Problems:   Hypertension   Diabetes (HCC)   Diastolic dysfunction   Left bundle branch block   Discharge Condition: good  Diet recommendation: heart healthy  There were no vitals filed for this visit.  History of present illness:  Marcia Mcafee Pittsis a 74 y.o.femalewith medical history significant fortype 2 diabetes mellitus, hypertension and dyslipidemia who presents to the ER via private vehicle for evaluation of chest pain. Patient describes an episode of substernal pressure that occurred this morning while she was getting her head down and lasted about 30 to 40 minutes. It was nonradiating but was associated with shortness of breath and diaphoresis. She denies having any nausea, vomiting or palpitations. Patient states that she has had episodes of chest pain in the past but not lasting as long as this episode. She is currently pain-free. She denies having any abdominal pain, no changes in her bowel habits, no fever, no chills, no urinary symptoms, no dizziness or lightheadedness.  Hospital Course:  # Chest pain History of exertional chest pain with SOB worked up in the past with stress test x2 and cardiac cath, patient thinks the last of this w/u was about 10 years ago. Yesterday while doing her hair had a 5 minute episode of intense epigastric/substernal chest pressure that resolved spontaneously. Chest pain free here. LBB on ekg, no prior available for comparison. Received aspirin. Given concern for unstable angina tte and myoview performed, tte w/o sig findings and myoview low risk study. Given resolution of symptoms further evaluation  deferred.   - d/c with sublingual nitro prn and cardiology f/u prn - pcp f/u 1-2 weeks - better dm control (a1c 8.4)  Procedures:  Tte, nuclear stress test   Consultations:  cardiology  Discharge Exam: Vitals:   09/15/20 0400 09/15/20 0600  BP: (!) 149/63 (!) 163/79  Pulse: 61 61  Resp: 18 16  Temp:    SpO2: 93% 96%    Constitutional:NAD, alert and oriented x 3 ENMT:Mucous membranes are moist.  Respiratory:clear to auscultation bilaterally, no wheezing, no crackles. Normal respiratory effort. No accessory muscle use.  Cardiovascular:Bradycardia,no murmurs / rubs / gallops. No extremity edema. Trace pedal edema Abdomen:no tenderness, no masses palpated. No hepatosplenomegaly. Bowel sounds positive. Central adiposity Musculoskeletal:no clubbing / cyanosis. No joint deformity upper and lower extremities.  Skin:no rashes, lesions, ulcers.  Neurologic:No gross focal neurologic deficit. Psychiatric:Normal mood and affect.  Discharge Instructions   Discharge Instructions    Ambulatory referral to Cardiology   Complete by: As directed    Diet - low sodium heart healthy   Complete by: As directed    Increase activity slowly   Complete by: As directed      Allergies as of 09/15/2020   No Known Allergies     Medication List    TAKE these medications   amLODipine 10 MG tablet Commonly known as: NORVASC Take 10 mg by mouth daily.   aspirin EC 81 MG tablet Take 81 mg by mouth daily.   celecoxib 200 MG capsule Commonly known as: CELEBREX Take 200 mg by mouth daily.   cyanocobalamin 1000 MCG/ML injection Commonly known as: (VITAMIN B-12) Inject into the muscle.  diclofenac Sodium 1 % Gel Commonly known as: VOLTAREN Apply 2 g topically in the morning, at noon, in the evening, and at bedtime.   etodolac 400 MG tablet Commonly known as: LODINE Take 400 mg by mouth daily.   gabapentin 300 MG capsule Commonly known as: NEURONTIN Take 300 mg by mouth  3 (three) times daily.   glimepiride 4 MG tablet Commonly known as: AMARYL Take 4 mg by mouth daily with breakfast.   hydrochlorothiazide 25 MG tablet Commonly known as: HYDRODIURIL Take 25 mg by mouth daily.   Jardiance 10 MG Tabs tablet Generic drug: empagliflozin Take 10 mg by mouth daily.   metFORMIN 500 MG 24 hr tablet Commonly known as: GLUCOPHAGE-XR Take 1,000 mg by mouth every evening.   metoprolol succinate 50 MG 24 hr tablet Commonly known as: TOPROL-XL Take 25 mg by mouth 2 (two) times daily.   Multi-Vitamin tablet Take 1 tablet by mouth daily.   nitroGLYCERIN 0.4 MG SL tablet Commonly known as: Nitrostat Place 1 tablet (0.4 mg total) under the tongue every 5 (five) minutes as needed for chest pain (no more than 3 tablets in a 15 minute period).   olmesartan 40 MG tablet Commonly known as: BENICAR Take 40 mg by mouth daily.   omeprazole 20 MG capsule Commonly known as: PRILOSEC Take 20 mg by mouth daily.   oxyCODONE 5 MG immediate release tablet Commonly known as: Oxy IR/ROXICODONE Take 5 mg by mouth every 8 (eight) hours as needed.   rosuvastatin 10 MG tablet Commonly known as: CRESTOR Take 10 mg by mouth daily.   triamcinolone 0.1 % Commonly known as: KENALOG Apply 1 application topically 2 (two) times daily.   Vitamin D-3 125 MCG (5000 UT) Tabs Take 5,000 Units by mouth daily.      No Known Allergies  Follow-up Information    Gollan, Tollie Pizza, MD. Schedule an appointment as soon as possible for a visit.   Specialty: Cardiology Contact information: [address] [place] [address] [phone]                The results of significant diagnostics from this hospitalization (including imaging, microbiology, ancillary and laboratory) are listed below for reference.    Significant Diagnostic Studies: DG Chest 2 View  Result Date: 09/14/2020 CLINICAL DATA:  Mid sternal chest pain EXAM: CHEST - 2 VIEW COMPARISON:   None FINDINGS: The heart size and mediastinal contours are within normal limits. Both lungs are clear. The visualized skeletal structures are unremarkable. Ordinary mild degenerative changes affect the spine. IMPRESSION: No active cardiopulmonary disease. Electronically Signed   By: Paulina Fusi M.D.   On: 09/14/2020 13:35   NM Myocar Multi W/Spect W/Wall Motion / EF  Result Date: 09/15/2020 Pharmacological myocardial perfusion imaging study with no significant  Ischemia Small mild defect in the apical region, likely attenuation artifact Normal wall motion, EF estimated at 62% No EKG changes concerning for ischemia at peak stress or in recovery. CT attenuation correction images with coronary calcification in the LAD, very mild in the aorta Low risk scan Signed, Dossie Arbour, MD, Ph.D [facility]   ECHOCARDIOGRAM COMPLETE  Result Date: 09/15/2020    ECHOCARDIOGRAM REPORT   Patient Name:   [facility] A Worthington Date of Exam: 09/15/2020 Medical Rec #:  [id number]     Height:       70.0 in Accession #:    [id number]    Weight:       192.6 lb Date  of Birth:  01-23-1946      BSA:          2.054 m Patient Age:    74 years      BP:           120/80 mmHg Patient Gender: F             HR:           71 bpm. Exam Location:  [facility] Procedure: 2D Echo, Cardiac Doppler and Color Doppler Indications:    unstable angina, CAD  History:        Patient has no prior history of Echocardiogram examinations.                 Angina; Signs/Symptoms:Chest Pain.  Sonographer:    Aurea Graff Referring Phys: [id number] TIMOTHY J GOLLAN IMPRESSIONS  1. Left ventricular ejection fraction, by estimation, is 55%. The left ventricle has normal function. Anteroseptal wall motion abnormality, likely secondary to conduction abnormailty, bundle branch block. There is mild left ventricular hypertrophy. Left  ventricular diastolic parameters are consistent with Grade I diastolic dysfunction (impaired relaxation).  2. Right ventricular systolic function is normal. The  right ventricular size is normal.  3. The mitral valve is normal in structure. Mild mitral valve regurgitation.  4. Challenging images, definity used FINDINGS  Left Ventricle: Left ventricular ejection fraction, by estimation, is 55 %. The left ventricle has normal function. The left ventricle has no regional wall motion abnormalities. Definity contrast agent was given IV to delineate the left ventricular endocardial borders. The left ventricular internal cavity size was normal in size. There is mild left ventricular hypertrophy. Left ventricular diastolic parameters are consistent with Grade I diastolic dysfunction (impaired relaxation). Right Ventricle: The right ventricular size is normal. No increase in right ventricular wall thickness. Right ventricular systolic function is normal. Left Atrium: Left atrial size was normal in size. Right Atrium: Right atrial size was normal in size. Pericardium: There is no evidence of pericardial effusion. Mitral Valve: The mitral valve is normal in structure. Mild mitral valve regurgitation. No evidence of mitral valve stenosis. MV peak gradient, 3.4 mmHg. The mean mitral valve gradient is 1.0 mmHg. Tricuspid Valve: The tricuspid valve is normal in structure. Tricuspid valve regurgitation is not demonstrated. No evidence of tricuspid stenosis. Aortic Valve: The aortic valve is normal in structure. Aortic valve regurgitation is not visualized. No aortic stenosis is present. Aortic valve mean gradient measures 5.0 mmHg. Aortic valve peak gradient measures 9.2 mmHg. Aortic valve area, by VTI measures 2.76 cm. Pulmonic Valve: The pulmonic valve was normal in structure. Pulmonic valve regurgitation is mild. No evidence of pulmonic stenosis. Aorta: The aortic root is normal in size and structure. Venous: The inferior vena cava is normal in size with greater than 50% respiratory variability, suggesting right atrial pressure of 3 mmHg. IAS/Shunts: No atrial level shunt detected by  color flow Doppler.  LEFT VENTRICLE PLAX 2D LVIDd:         3.83 cm  Diastology LVIDs:         2.36 cm  LV e' medial:    3.81 cm/s LV PW:         1.22 cm  LV E/e' medial:  12.0 LV IVS:        0.92 cm  LV e' lateral:   9.03 cm/s LVOT diam:     2.00 cm  LV E/e' lateral: 5.1 LV SV:         81 LV SV Index:  39 LVOT Area:     3.14 cm  LEFT ATRIUM           Index LA diam:      2.60 cm 1.27 cm/m LA Vol (A4C): 33.9 ml 16.50 ml/m  AORTIC VALVE                    PULMONIC VALVE AV Area (Vmax):    2.65 cm     PV Vmax:       1.21 m/s AV Area (Vmean):   2.86 cm     PV Vmean:      74.600 cm/s AV Area (VTI):     2.76 cm     PV VTI:        0.191 m AV Vmax:           152.00 cm/s  PV Peak grad:  5.9 mmHg AV Vmean:          105.000 cm/s PV Mean grad:  3.0 mmHg AV VTI:            0.293 m AV Peak Grad:      9.2 mmHg AV Mean Grad:      5.0 mmHg LVOT Vmax:         128.00 cm/s LVOT Vmean:        95.600 cm/s LVOT VTI:          0.257 m LVOT/AV VTI ratio: 0.88  AORTA Ao Root diam: 3.10 cm MITRAL VALVE MV Area (PHT): 5.62 cm    SHUNTS MV Peak grad:  3.4 mmHg    Systemic VTI:  0.26 m MV Mean grad:  1.0 mmHg    Systemic Diam: 2.00 cm MV Vmax:       0.92 m/s MV Vmean:      49.0 cm/s MV Decel Time: 135 msec MV E velocity: 45.80 cm/s MV A velocity: 63.40 cm/s MV E/A ratio:  0.72 Julien Nordmann MD Electronically signed by Julien Nordmann MD Signature Date/Time: 09/15/2020/1:10:54 PM    Final     Microbiology: Recent Results (from the past 240 hour(s))  Resp Panel by RT-PCR (Flu A&B, Covid) Nasopharyngeal Swab     Status: None   Collection Time: 09/14/20  2:35 PM   Specimen: Nasopharyngeal Swab; Nasopharyngeal(NP) swabs in vial transport medium  Result Value Ref Range Status   SARS Coronavirus 2 by RT PCR NEGATIVE NEGATIVE Final    Comment: (NOTE) SARS-CoV-2 target nucleic acids are NOT DETECTED.  The SARS-CoV-2 RNA is generally detectable in upper respiratory specimens during the acute phase of infection. The  lowest concentration of SARS-CoV-2 viral copies this assay can detect is 138 copies/mL. A negative result does not preclude SARS-Cov-2 infection and should not be used as the sole basis for treatment or other patient management decisions. A negative result may occur with  improper specimen collection/handling, submission of specimen other than nasopharyngeal swab, presence of viral mutation(s) within the areas targeted by this assay, and inadequate number of viral copies(<138 copies/mL). A negative result must be combined with clinical observations, patient history, and epidemiological information. The expected result is Negative.  Fact Sheet for Patients:  BloggerCourse.com  Fact Sheet for Healthcare Providers:  SeriousBroker.it  This test is no t yet approved or cleared by the [place] FDA and  has been authorized for detection and/or diagnosis of SARS-CoV-2 by FDA under an Emergency Use Authorization (EUA). This EUA will remain  in effect (meaning this test can be used) for the duration of the  COVID-19 declaration under Section 564(b)(1) of the Act, 21 U.S.C.section 360bbb-3(b)(1), unless the authorization is terminated  or revoked sooner.       Influenza A by PCR NEGATIVE NEGATIVE Final   Influenza B by PCR NEGATIVE NEGATIVE Final    Comment: (NOTE) The Xpert Xpress SARS-CoV-2/FLU/RSV plus assay is intended as an aid in the diagnosis of influenza from Nasopharyngeal swab specimens and should not be used as a sole basis for treatment. Nasal washings and aspirates are unacceptable for Xpert Xpress SARS-CoV-2/FLU/RSV testing.  Fact Sheet for Patients: BloggerCourse.com  Fact Sheet for Healthcare Providers: SeriousBroker.it  This test is not yet approved or cleared by the [place] FDA and has been authorized for detection and/or diagnosis of SARS-CoV-2 by FDA under  an Emergency Use Authorization (EUA). This EUA will remain in effect (meaning this test can be used) for the duration of the COVID-19 declaration under Section 564(b)(1) of the Act, 21 U.S.C. section 360bbb-3(b)(1), unless the authorization is terminated or revoked.  Performed at [facility], [address] [place], [place] [address]      Labs: Basic Metabolic Panel: Recent Labs  Lab 09/14/20 1255  NA 137  K 4.2  CL 102  CO2 24  GLUCOSE 122*  BUN 36*  CREATININE 1.02*  CALCIUM 10.1   Liver Function Tests: No results for input(s): AST, ALT, ALKPHOS, BILITOT, PROT, ALBUMIN in the last 168 hours. No results for input(s): LIPASE, AMYLASE in the last 168 hours. No results for input(s): AMMONIA in the last 168 hours. CBC: Recent Labs  Lab 09/14/20 1255  WBC 7.6  HGB 14.6  HCT 44.2  MCV 88.4  PLT 237   Cardiac Enzymes: No results for input(s): CKTOTAL, CKMB, CKMBINDEX, TROPONINI in the last 168 hours. BNP: BNP (last 3 results) No results for input(s): BNP in the last 8760 hours.  ProBNP (last 3 results) No results for input(s): PROBNP in the last 8760 hours.  CBG: Recent Labs  Lab 09/14/20 1621 09/14/20 1736 09/14/20 2220  GLUCAP 87 142* 151*       Signed:  Silvano Bilis MD.  [facility] 09/15/2020, 3:22 PM

## 2020-09-15 NOTE — Discharge Instructions (Signed)

## 2020-09-19 DIAGNOSIS — F1120 Opioid dependence, uncomplicated: Secondary | ICD-10-CM | POA: Diagnosis not present

## 2020-09-19 DIAGNOSIS — Z79891 Long term (current) use of opiate analgesic: Secondary | ICD-10-CM | POA: Diagnosis not present

## 2020-09-22 DIAGNOSIS — I509 Heart failure, unspecified: Secondary | ICD-10-CM | POA: Diagnosis not present

## 2020-09-22 DIAGNOSIS — M545 Low back pain, unspecified: Secondary | ICD-10-CM | POA: Diagnosis not present

## 2020-09-22 DIAGNOSIS — E1140 Type 2 diabetes mellitus with diabetic neuropathy, unspecified: Secondary | ICD-10-CM | POA: Diagnosis not present

## 2020-09-25 ENCOUNTER — Ambulatory Visit (INDEPENDENT_AMBULATORY_CARE_PROVIDER_SITE_OTHER): Payer: Medicare HMO | Admitting: Cardiology

## 2020-09-25 ENCOUNTER — Encounter: Payer: Self-pay | Admitting: Cardiology

## 2020-09-25 ENCOUNTER — Other Ambulatory Visit: Payer: Self-pay

## 2020-09-25 VITALS — BP 128/68 | HR 57 | Ht 70.0 in | Wt 182.4 lb

## 2020-09-25 DIAGNOSIS — E7800 Pure hypercholesterolemia, unspecified: Secondary | ICD-10-CM | POA: Diagnosis not present

## 2020-09-25 DIAGNOSIS — R072 Precordial pain: Secondary | ICD-10-CM

## 2020-09-25 DIAGNOSIS — I10 Essential (primary) hypertension: Secondary | ICD-10-CM

## 2020-09-25 MED ORDER — ISOSORBIDE MONONITRATE ER 30 MG PO TB24: 15.0000 mg | ORAL_TABLET | Freq: Every day | ORAL | 5 refills | Status: DC

## 2020-09-25 NOTE — Progress Notes (Signed)
Cardiology Office Note:    Date:  09/25/2020   ID:  [name], DOB 12-30-1945, MRN [id number]  PCP:  Danella Penton, MD  [facility] Cardiologist:  Lorine Bears, MD  [facility] Electrophysiologist:  None   Referring MD: Kathrynn Running, MD   Chief Complaint  Patient presents with  . Other    Chest pain ED f/u lexi/echo. Meds reviewed verbally with pt.    History of Present Illness:    [name] is a 74 y.o. female with a hx of hypertension, hyperlipidemia, diabetes presenting with follow-up.  Recently seen in the hospital 09/15/2020 due to chest pain.  While admitted, work-up with echocardiogram showed normal ejection fraction, EF 55%, Lexiscan Myoview 12/10 with no evidence for ischemia, low risk study.  Troponin levels were normal.  Coronary calcifications noted in the LAD.  She states feeling better overall since her recent hospital admission.  Still occasionally has mild chest tightness on exertion, resolving with rest.   Past Medical History:  Diagnosis Date  . Chest pain    a. reports multiple nl stress tests and nl cath in 2008.  . Diabetes mellitus without complication (HCC)   . History of echocardiogram    a. 07/2013 Echo: EF 50-55%. No rwma. Gr1 DD. Abnl septal motion consistent w/ LBBB. Mild MR/TR.  [name] Kitchen Hyperlipidemia   . Hypertension   . LBBB (left bundle branch block)     Past Surgical History:  Procedure Laterality Date  . endovascular laser N/A     Current Medications: Current Meds  Medication Sig  . aspirin EC 81 MG tablet Take 81 mg by mouth daily. Swallow whole.  . Cholecalciferol (VITAMIN D-3) 5000 units TABS Take 5,000 Units by mouth daily.  . cyanocobalamin (,VITAMIN B-12,) 1000 MCG/ML injection Inject into the muscle.  . diclofenac Sodium (VOLTAREN) 1 % GEL Apply 2 g topically in the morning, at noon, in the evening, and at bedtime.  [name] Kitchen etodolac (LODINE) 400 MG tablet Take 400 mg by mouth daily.  [name] Kitchen gabapentin (NEURONTIN) 300 MG  capsule Take 300 mg by mouth 3 (three) times daily.   [name] Kitchen glimepiride (AMARYL) 4 MG tablet Take 4 mg by mouth daily with breakfast.  . hydrochlorothiazide (HYDRODIURIL) 25 MG tablet Take 25 mg by mouth daily.  [name] Kitchen JARDIANCE 10 MG TABS tablet Take 10 mg by mouth daily.  . metFORMIN (GLUCOPHAGE-XR) 500 MG 24 hr tablet Take 1,000 mg by mouth every evening.  . metoprolol succinate (TOPROL-XL) 50 MG 24 hr tablet Take 25 mg by mouth 2 (two) times daily.  . Multiple Vitamin (MULTI-VITAMIN) tablet Take 1 tablet by mouth daily.  . nitroGLYCERIN (NITROSTAT) 0.4 MG SL tablet Place 1 tablet (0.4 mg total) under the tongue every 5 (five) minutes as needed for chest pain (no more than 3 tablets in a 15 minute period).  . olmesartan (BENICAR) 40 MG tablet Take 40 mg by mouth daily.  [name] Kitchen omeprazole (PRILOSEC) 20 MG capsule Take 20 mg by mouth daily.  [name] Kitchen oxyCODONE (OXY IR/ROXICODONE) 5 MG immediate release tablet Take 5 mg by mouth every 8 (eight) hours as needed.  . rosuvastatin (CRESTOR) 10 MG tablet Take 10 mg by mouth daily.  [name] Kitchen triamcinolone cream (KENALOG) 0.1 % Apply 1 application topically 2 (two) times daily.     Allergies:   Patient has no known allergies.   Social History   Socioeconomic History  . Marital status: Married    Spouse name: Not on file  .  Number of children: Not on file  . Years of education: Not on file  . Highest education level: Not on file  Occupational History  . Not on file  Tobacco Use  . Smoking status: Former Smoker    Packs/day: 0.50    Years: 40.00    Pack years: 20.00    Quit date: 10/07/2018    Years since quitting: 1.9  . Smokeless tobacco: Never Used  Vaping Use  . Vaping Use: Never used  Substance and Sexual Activity  . Alcohol use: No  . Drug use: No  . Sexual activity: Not on file  Other Topics Concern  . Not on file  Social History Narrative   Lives outside of [place] w/ husband.  Walks 1/3 mile/day.   Social Determinants of Health   Financial Resource  Strain: Not on file  Food Insecurity: Not on file  Transportation Needs: Not on file  Physical Activity: Not on file  Stress: Not on file  Social Connections: Not on file     Family History: The patient's family history includes Breast cancer in her maternal aunt; Celiac disease in her brother; Dementia in her mother; Heart attack in her father; Liver cancer in her sister.  ROS:   Please see the history of present illness.     All other systems reviewed and are negative.  EKGs/Labs/Other Studies Reviewed:    The following studies were reviewed today:   EKG:  EKG is  ordered today.  The ekg ordered today demonstrates sinus bradycardia, left bundle branch block.  Recent Labs: 09/14/2020: BUN 36; Creatinine, Ser 1.02; Hemoglobin 14.6; Platelets 237; Potassium 4.2; Sodium 137; TSH 1.267  Recent Lipid Panel    Component Value Date/Time   CHOL 122 09/14/2020 1738   TRIG 151 (H) 09/14/2020 1738   HDL 49 09/14/2020 1738   CHOLHDL 2.5 09/14/2020 1738   VLDL 30 09/14/2020 1738   LDLCALC 43 09/14/2020 1738     Risk Assessment/Calculations:      Physical Exam:    VS:  BP 128/68 (BP Location: Left Arm, Patient Position: Sitting, Cuff Size: Normal)   Pulse (!) 57   Ht 5\' 10"  (1.778 m)   Wt 182 lb 6 oz (82.7 kg)   SpO2 98%   BMI 26.17 kg/m     Wt Readings from Last 3 Encounters:  09/25/20 182 lb 6 oz (82.7 kg)  10/05/19 192 lb 9.6 oz (87.4 kg)  08/24/19 190 lb (86.2 kg)     GEN:  Well nourished, well developed in no acute distress HEENT: Normal NECK: No JVD; No carotid bruits LYMPHATICS: No lymphadenopathy CARDIAC: RRR, no murmurs, rubs, gallops RESPIRATORY:  Clear to auscultation without rales, wheezing or rhonchi  ABDOMEN: Soft, non-tender, non-distended MUSCULOSKELETAL:  No edema; No deformity  SKIN: Warm and dry NEUROLOGIC:  Alert and oriented x 3 PSYCHIATRIC:  Normal affect   ASSESSMENT:    1. Precordial pain   2. Primary hypertension   3. Pure  hypercholesterolemia    PLAN:    In order of problems listed above:  1. Chest pain, associated with exertion, resolves with rest.  Echo with preserved ejection fraction, EF 55%.  Lexiscan Myoview with no evidence of ischemia, LAD calcification noted on CT attenuated images.  Start Imdur 50 mg daily for antianginal benefit.  If symptoms persist, consider coronary CTA versus left heart cath. 2. Hypertension, BP controlled.  Continue current BP meds.  May have to decrease HCTZ with adding Imdur. 3. Hyperlipidemia, cholesterol controlled,  continue continue Crestor.  Follow-up in 3 months with Dr. Kirke Corin or APP    Medication Adjustments/Labs and Tests Ordered: Current medicines are reviewed at length with the patient today.  Concerns regarding medicines are outlined above.  Orders Placed This Encounter  Procedures  . EKG 12-Lead   Meds ordered this encounter  Medications  . isosorbide mononitrate (IMDUR) 30 MG 24 hr tablet    Sig: Take 0.5 tablets (15 mg total) by mouth daily.    Dispense:  15 tablet    Refill:  5    Patient Instructions  Medication Instructions:   Your physician has recommended you make the following change in your medication:   START taking IMDUR 15 MG: Take one tab by mouth once a day.  *If you need a refill on your cardiac medications before your next appointment, please call your pharmacy*   Lab Work: None Ordered If you have labs (blood work) drawn today and your tests are completely normal, you will receive your results only by: [name] Kitchen MyChart Message (if you have MyChart) OR . A paper copy in the mail If you have any lab test that is abnormal or we need to change your treatment, we will call you to review the results.   Testing/Procedures: None Ordered   Follow-Up: At [facility], you and your health needs are our priority.  As part of our continuing mission to provide you with exceptional heart care, we have created designated Provider Care Teams.   These Care Teams include your primary Cardiologist (physician) and Advanced Practice Providers (APPs -  Physician Assistants and Nurse Practitioners) who all work together to provide you with the care you need, when you need it.  We recommend signing up for the patient portal called "MyChart".  Sign up information is provided on this After Visit Summary.  MyChart is used to connect with patients for Virtual Visits (Telemedicine).  Patients are able to view lab/test results, encounter notes, upcoming appointments, etc.  Non-urgent messages can be sent to your provider as well.   To learn more about what you can do with MyChart, go to [email or web address].    Your next appointment:   3 month(s)  The format for your next appointment:   In Person  Provider:   You may see Lorine Bears, MD or one of the following Advanced Practice Providers on your designated Care Team:    Nicolasa Ducking, NP  Eula Listen, PA-C  Marisue Ivan, PA-C  [place], [place]  Gillian Shields, NP    Other Instructions      Signed, Debbe Odea, MD  09/25/2020 12:49 PM    [facility]

## 2020-09-25 NOTE — Patient Instructions (Signed)
Medication Instructions:   Your physician has recommended you make the following change in your medication:   START taking IMDUR 15 MG: Take one tab by mouth once a day.  *If you need a refill on your cardiac medications before your next appointment, please call your pharmacy*   Lab Work: None Ordered If you have labs (blood work) drawn today and your tests are completely normal, you will receive your results only by: [name] Kitchen MyChart Message (if you have MyChart) OR . A paper copy in the mail If you have any lab test that is abnormal or we need to change your treatment, we will call you to review the results.   Testing/Procedures: None Ordered   Follow-Up: At [facility], you and your health needs are our priority.  As part of our continuing mission to provide you with exceptional heart care, we have created designated Provider Care Teams.  These Care Teams include your primary Cardiologist (physician) and Advanced Practice Providers (APPs -  Physician Assistants and Nurse Practitioners) who all work together to provide you with the care you need, when you need it.  We recommend signing up for the patient portal called "MyChart".  Sign up information is provided on this After Visit Summary.  MyChart is used to connect with patients for Virtual Visits (Telemedicine).  Patients are able to view lab/test results, encounter notes, upcoming appointments, etc.  Non-urgent messages can be sent to your provider as well.   To learn more about what you can do with MyChart, go to [email or web address].    Your next appointment:   3 month(s)  The format for your next appointment:   In Person  Provider:   You may see Lorine Bears, MD or one of the following Advanced Practice Providers on your designated Care Team:    Nicolasa Ducking, NP  Eula Listen, PA-C  Marisue Ivan, PA-C  [place], [place]  Gillian Shields, NP    Other Instructions

## 2020-10-02 DIAGNOSIS — M79671 Pain in right foot: Secondary | ICD-10-CM | POA: Diagnosis not present

## 2020-10-02 DIAGNOSIS — E0842 Diabetes mellitus due to underlying condition with diabetic polyneuropathy: Secondary | ICD-10-CM | POA: Diagnosis not present

## 2020-10-02 DIAGNOSIS — I10 Essential (primary) hypertension: Secondary | ICD-10-CM | POA: Diagnosis not present

## 2020-10-02 DIAGNOSIS — F1120 Opioid dependence, uncomplicated: Secondary | ICD-10-CM | POA: Diagnosis not present

## 2020-10-02 DIAGNOSIS — E08621 Diabetes mellitus due to underlying condition with foot ulcer: Secondary | ICD-10-CM | POA: Diagnosis not present

## 2020-10-02 DIAGNOSIS — G894 Chronic pain syndrome: Secondary | ICD-10-CM | POA: Diagnosis not present

## 2020-10-25 DIAGNOSIS — F1120 Opioid dependence, uncomplicated: Secondary | ICD-10-CM | POA: Diagnosis not present

## 2020-10-25 DIAGNOSIS — Z79891 Long term (current) use of opiate analgesic: Secondary | ICD-10-CM | POA: Diagnosis not present

## 2020-10-31 DIAGNOSIS — F1120 Opioid dependence, uncomplicated: Secondary | ICD-10-CM | POA: Diagnosis not present

## 2020-10-31 DIAGNOSIS — E08621 Diabetes mellitus due to underlying condition with foot ulcer: Secondary | ICD-10-CM | POA: Diagnosis not present

## 2020-10-31 DIAGNOSIS — I10 Essential (primary) hypertension: Secondary | ICD-10-CM | POA: Diagnosis not present

## 2020-10-31 DIAGNOSIS — E0842 Diabetes mellitus due to underlying condition with diabetic polyneuropathy: Secondary | ICD-10-CM | POA: Diagnosis not present

## 2020-10-31 DIAGNOSIS — G894 Chronic pain syndrome: Secondary | ICD-10-CM | POA: Diagnosis not present

## 2020-10-31 DIAGNOSIS — G8929 Other chronic pain: Secondary | ICD-10-CM | POA: Diagnosis not present

## 2020-10-31 DIAGNOSIS — M79671 Pain in right foot: Secondary | ICD-10-CM | POA: Diagnosis not present

## 2020-11-02 DIAGNOSIS — E1140 Type 2 diabetes mellitus with diabetic neuropathy, unspecified: Secondary | ICD-10-CM | POA: Diagnosis not present

## 2020-11-07 DIAGNOSIS — Z0000 Encounter for general adult medical examination without abnormal findings: Secondary | ICD-10-CM | POA: Diagnosis not present

## 2020-11-08 DIAGNOSIS — I509 Heart failure, unspecified: Secondary | ICD-10-CM | POA: Diagnosis not present

## 2020-11-08 DIAGNOSIS — E11621 Type 2 diabetes mellitus with foot ulcer: Secondary | ICD-10-CM | POA: Diagnosis not present

## 2020-11-08 DIAGNOSIS — L97919 Non-pressure chronic ulcer of unspecified part of right lower leg with unspecified severity: Secondary | ICD-10-CM | POA: Diagnosis not present

## 2020-11-08 DIAGNOSIS — Z0000 Encounter for general adult medical examination without abnormal findings: Secondary | ICD-10-CM | POA: Diagnosis not present

## 2020-11-08 DIAGNOSIS — U071 COVID-19: Secondary | ICD-10-CM | POA: Diagnosis not present

## 2020-11-14 DIAGNOSIS — E119 Type 2 diabetes mellitus without complications: Secondary | ICD-10-CM | POA: Diagnosis not present

## 2020-11-14 DIAGNOSIS — I509 Heart failure, unspecified: Secondary | ICD-10-CM | POA: Diagnosis not present

## 2020-11-14 DIAGNOSIS — U071 COVID-19: Secondary | ICD-10-CM | POA: Diagnosis not present

## 2020-11-14 DIAGNOSIS — J208 Acute bronchitis due to other specified organisms: Secondary | ICD-10-CM | POA: Diagnosis not present

## 2020-11-16 DIAGNOSIS — R69 Illness, unspecified: Secondary | ICD-10-CM | POA: Diagnosis not present

## 2020-12-25 DIAGNOSIS — F1120 Opioid dependence, uncomplicated: Secondary | ICD-10-CM | POA: Diagnosis not present

## 2020-12-25 DIAGNOSIS — E08621 Diabetes mellitus due to underlying condition with foot ulcer: Secondary | ICD-10-CM | POA: Diagnosis not present

## 2020-12-25 DIAGNOSIS — E0842 Diabetes mellitus due to underlying condition with diabetic polyneuropathy: Secondary | ICD-10-CM | POA: Diagnosis not present

## 2020-12-25 DIAGNOSIS — G8929 Other chronic pain: Secondary | ICD-10-CM | POA: Diagnosis not present

## 2020-12-25 DIAGNOSIS — I10 Essential (primary) hypertension: Secondary | ICD-10-CM | POA: Diagnosis not present

## 2020-12-25 DIAGNOSIS — M79671 Pain in right foot: Secondary | ICD-10-CM | POA: Diagnosis not present

## 2020-12-25 DIAGNOSIS — G894 Chronic pain syndrome: Secondary | ICD-10-CM | POA: Diagnosis not present

## 2020-12-28 ENCOUNTER — Ambulatory Visit: Payer: Medicare HMO | Admitting: Cardiovascular Disease

## 2020-12-28 ENCOUNTER — Encounter: Payer: Self-pay | Admitting: Cardiovascular Disease

## 2020-12-28 ENCOUNTER — Other Ambulatory Visit: Payer: Self-pay

## 2020-12-28 VITALS — BP 116/70 | HR 69 | Ht 70.0 in | Wt 185.0 lb

## 2020-12-28 DIAGNOSIS — E7800 Pure hypercholesterolemia, unspecified: Secondary | ICD-10-CM | POA: Diagnosis not present

## 2020-12-28 DIAGNOSIS — I447 Left bundle-branch block, unspecified: Secondary | ICD-10-CM | POA: Diagnosis not present

## 2020-12-28 DIAGNOSIS — I209 Angina pectoris, unspecified: Secondary | ICD-10-CM

## 2020-12-28 DIAGNOSIS — I10 Essential (primary) hypertension: Secondary | ICD-10-CM | POA: Diagnosis not present

## 2020-12-28 NOTE — Progress Notes (Signed)
Cardiology Office Note   Date:  12/28/2020   ID:  [name], DOB 01-27-1946, MRN [id number]  PCP:  Danella Penton, MD  Cardiologist:   Lorine Bears, MD   Chief Complaint  Patient presents with  . Follow-up    3 months      History of Present Illness: [name] is a 75 y.o. female who presents for a follow-up visit regarding chest pain.  She has known history of left bundle branch block, essential hypertension, hyperlipidemia and diabetes mellitus.  She had previous cardiac catheterization in 2008 which showed minor irregularities.  Images were personally reviewed by me today. She was hospitalized in December 2021 with atypical chest pain.  Troponin was normal.  Echocardiogram showed normal LV systolic function.  Lexiscan Myoview showed no evidence of ischemia.  Coronary calcifications noted in the LAD distribution. During last visit, she reported continued exertional chest pain.  However, she reports improvement in symptoms with Imdur.  She only experiences mild discomfort if she goes uphill.  She does not get any discomfort with regular everyday activities.  No shortness of breath, orthopnea or PND.   Past Medical History:  Diagnosis Date  . Chest pain    a. reports multiple nl stress tests and nl cath in 2008.  . Diabetes mellitus without complication (HCC)   . History of echocardiogram    a. 07/2013 Echo: EF 50-55%. No rwma. Gr1 DD. Abnl septal motion consistent w/ LBBB. Mild MR/TR.  [name] Kitchen Hyperlipidemia   . Hypertension   . LBBB (left bundle branch block)     Past Surgical History:  Procedure Laterality Date  . endovascular laser N/A      Current Outpatient Medications  Medication Sig Dispense Refill  . aspirin EC 81 MG tablet Take 81 mg by mouth daily. Swallow whole.    . Cholecalciferol (VITAMIN D3) 25 MCG (1000 UT) CAPS Take 1,000 Units by mouth daily.    . cyanocobalamin (,VITAMIN B-12,) 1000 MCG/ML injection Inject into the muscle.    . diclofenac Sodium  (VOLTAREN) 1 % GEL Apply 2 g topically in the morning, at noon, in the evening, and at bedtime.    [name] Kitchen etodolac (LODINE) 400 MG tablet Take 400 mg by mouth daily.    [name] Kitchen gabapentin (NEURONTIN) 300 MG capsule Take 300 mg by mouth 3 (three) times daily.     [name] Kitchen glimepiride (AMARYL) 4 MG tablet Take 2 mg by mouth daily with breakfast.    . hydrochlorothiazide (HYDRODIURIL) 25 MG tablet Take 25 mg by mouth daily.    . isosorbide mononitrate (IMDUR) 30 MG 24 hr tablet Take 0.5 tablets (15 mg total) by mouth daily. 15 tablet 5  . JARDIANCE 10 MG TABS tablet Take 10 mg by mouth daily.    . metFORMIN (GLUCOPHAGE-XR) 500 MG 24 hr tablet Take 1,000 mg by mouth every evening.    . metoprolol succinate (TOPROL-XL) 50 MG 24 hr tablet Take 25 mg by mouth 2 (two) times daily.  0  . nitroGLYCERIN (NITROSTAT) 0.4 MG SL tablet Place 1 tablet (0.4 mg total) under the tongue every 5 (five) minutes as needed for chest pain (no more than 3 tablets in a 15 minute period). 30 tablet 3  . olmesartan (BENICAR) 40 MG tablet Take 40 mg by mouth daily.    [name] Kitchen omeprazole (PRILOSEC) 20 MG capsule Take 20 mg by mouth daily.    [name] Kitchen oxyCODONE (OXY IR/ROXICODONE) 5 MG immediate release tablet Take 5 mg by  mouth every 8 (eight) hours as needed.    . rosuvastatin (CRESTOR) 10 MG tablet Take 10 mg by mouth daily.    [name] Kitchen triamcinolone cream (KENALOG) 0.1 % Apply 1 application topically 2 (two) times daily.     No current facility-administered medications for this visit.    Allergies:   Patient has no known allergies.    Social History:  The patient  reports that she quit smoking about 2 years ago. She has a 20.00 pack-year smoking history. She has never used smokeless tobacco. She reports that she does not drink alcohol and does not use drugs.   Family History:  The patient's family history includes Breast cancer in her maternal aunt; Celiac disease in her brother; Dementia in her mother; Heart attack in her father; Liver cancer in her  sister.    ROS:  Please see the history of present illness.   Otherwise, review of systems are positive for none.   All other systems are reviewed and negative.    PHYSICAL EXAM: VS:  BP 116/70   Pulse 69   Ht 5\' 10"  (1.778 m)   Wt 185 lb (83.9 kg)   BMI 26.54 kg/m  , BMI Body mass index is 26.54 kg/m. GEN: Well nourished, well developed, in no acute distress  HEENT: normal  Neck: no JVD, carotid bruits, or masses Cardiac: RRR; no murmurs, rubs, or gallops,no edema  Respiratory:  clear to auscultation bilaterally, normal work of breathing GI: soft, nontender, nondistended, + BS MS: no deformity or atrophy  Skin: warm and dry, no rash Neuro:  Strength and sensation are intact Psych: euthymic mood, full affect   EKG:  EKG is not ordered today.    Recent Labs: 09/14/2020: BUN 36; Creatinine, Ser 1.02; Hemoglobin 14.6; Platelets 237; Potassium 4.2; Sodium 137; TSH 1.267    Lipid Panel    Component Value Date/Time   CHOL 122 09/14/2020 1738   TRIG 151 (H) 09/14/2020 1738   HDL 49 09/14/2020 1738   CHOLHDL 2.5 09/14/2020 1738   VLDL 30 09/14/2020 1738   LDLCALC 43 09/14/2020 1738      Wt Readings from Last 3 Encounters:  12/28/20 185 lb (83.9 kg)  09/25/20 182 lb 6 oz (82.7 kg)  10/05/19 192 lb 9.6 oz (87.4 kg)      No flowsheet data found.    ASSESSMENT AND PLAN:  1.  Class II angina: The patient symptoms are suggestive of class II angina as she describes exertional chest pain with overexertion and not with every days activities.  She does report improvement in symptoms since she was placed on medications including Imdur.  Cardiac stress test was low risk overall.  Given improvement in symptoms, recommend continuing medical therapy.  If symptoms worsen, can proceed with cardiac catheterization.  2.  Essential hypertension: Blood pressures well controlled  3.  Hyperlipidemia: Continue treatment with rosuvastatin.  Most recent lipid profile showed an LDL of  43.  4.  Left bundle branch block: No symptoms.  5.  Peripheral neuropathy: She has a lot of leg symptoms.  Distal pulses are normal and I do not think she has peripheral arterial disease.    Disposition:   FU with me in 6 months  Signed,  10/07/19, MD  12/28/2020 2:38 PM    [facility]

## 2020-12-28 NOTE — Patient Instructions (Signed)

## 2021-02-13 DIAGNOSIS — Z79891 Long term (current) use of opiate analgesic: Secondary | ICD-10-CM | POA: Diagnosis not present

## 2021-02-13 DIAGNOSIS — F1120 Opioid dependence, uncomplicated: Secondary | ICD-10-CM | POA: Diagnosis not present

## 2021-02-19 DIAGNOSIS — F1120 Opioid dependence, uncomplicated: Secondary | ICD-10-CM | POA: Diagnosis not present

## 2021-02-19 DIAGNOSIS — M79671 Pain in right foot: Secondary | ICD-10-CM | POA: Diagnosis not present

## 2021-02-19 DIAGNOSIS — E0842 Diabetes mellitus due to underlying condition with diabetic polyneuropathy: Secondary | ICD-10-CM | POA: Diagnosis not present

## 2021-02-19 DIAGNOSIS — I10 Essential (primary) hypertension: Secondary | ICD-10-CM | POA: Diagnosis not present

## 2021-02-19 DIAGNOSIS — E08621 Diabetes mellitus due to underlying condition with foot ulcer: Secondary | ICD-10-CM | POA: Diagnosis not present

## 2021-02-19 DIAGNOSIS — G894 Chronic pain syndrome: Secondary | ICD-10-CM | POA: Diagnosis not present

## 2021-02-19 DIAGNOSIS — G8929 Other chronic pain: Secondary | ICD-10-CM | POA: Diagnosis not present

## 2021-03-12 ENCOUNTER — Other Ambulatory Visit: Payer: Self-pay

## 2021-03-12 MED ORDER — ISOSORBIDE MONONITRATE ER 30 MG PO TB24: 15.0000 mg | ORAL_TABLET | Freq: Every day | ORAL | 2 refills | Status: DC

## 2021-04-02 DIAGNOSIS — Z79891 Long term (current) use of opiate analgesic: Secondary | ICD-10-CM | POA: Diagnosis not present

## 2021-04-02 DIAGNOSIS — F1120 Opioid dependence, uncomplicated: Secondary | ICD-10-CM | POA: Diagnosis not present

## 2021-04-16 DIAGNOSIS — M79671 Pain in right foot: Secondary | ICD-10-CM | POA: Diagnosis not present

## 2021-04-16 DIAGNOSIS — E08621 Diabetes mellitus due to underlying condition with foot ulcer: Secondary | ICD-10-CM | POA: Diagnosis not present

## 2021-04-16 DIAGNOSIS — E0842 Diabetes mellitus due to underlying condition with diabetic polyneuropathy: Secondary | ICD-10-CM | POA: Diagnosis not present

## 2021-04-16 DIAGNOSIS — G894 Chronic pain syndrome: Secondary | ICD-10-CM | POA: Diagnosis not present

## 2021-04-16 DIAGNOSIS — I10 Essential (primary) hypertension: Secondary | ICD-10-CM | POA: Diagnosis not present

## 2021-04-16 DIAGNOSIS — F1120 Opioid dependence, uncomplicated: Secondary | ICD-10-CM | POA: Diagnosis not present

## 2021-04-25 DIAGNOSIS — E538 Deficiency of other specified B group vitamins: Secondary | ICD-10-CM | POA: Diagnosis not present

## 2021-04-25 DIAGNOSIS — E1140 Type 2 diabetes mellitus with diabetic neuropathy, unspecified: Secondary | ICD-10-CM | POA: Diagnosis not present

## 2021-04-25 DIAGNOSIS — R300 Dysuria: Secondary | ICD-10-CM | POA: Diagnosis not present

## 2021-05-01 ENCOUNTER — Other Ambulatory Visit: Payer: Self-pay | Admitting: Cardiovascular Disease

## 2021-05-08 ENCOUNTER — Other Ambulatory Visit: Payer: Self-pay | Admitting: Internal Medicine

## 2021-05-08 DIAGNOSIS — M5440 Lumbago with sciatica, unspecified side: Secondary | ICD-10-CM

## 2021-05-08 DIAGNOSIS — Z0000 Encounter for general adult medical examination without abnormal findings: Secondary | ICD-10-CM | POA: Diagnosis not present

## 2021-05-08 DIAGNOSIS — E538 Deficiency of other specified B group vitamins: Secondary | ICD-10-CM | POA: Diagnosis not present

## 2021-05-08 DIAGNOSIS — E1140 Type 2 diabetes mellitus with diabetic neuropathy, unspecified: Secondary | ICD-10-CM | POA: Diagnosis not present

## 2021-05-08 DIAGNOSIS — E119 Type 2 diabetes mellitus without complications: Secondary | ICD-10-CM | POA: Diagnosis not present

## 2021-05-08 DIAGNOSIS — M5416 Radiculopathy, lumbar region: Secondary | ICD-10-CM | POA: Diagnosis not present

## 2021-05-17 ENCOUNTER — Ambulatory Visit
Admission: RE | Admit: 2021-05-17 | Discharge: 2021-05-17 | Disposition: A | Discharge status: Home / Self Care | Payer: Medicare HMO | Source: Ambulatory Visit | Attending: Internal Medicine | Admitting: Internal Medicine

## 2021-05-17 ENCOUNTER — Other Ambulatory Visit: Payer: Self-pay

## 2021-05-17 DIAGNOSIS — M5440 Lumbago with sciatica, unspecified side: Secondary | ICD-10-CM | POA: Diagnosis not present

## 2021-05-17 DIAGNOSIS — M545 Low back pain, unspecified: Secondary | ICD-10-CM | POA: Diagnosis not present

## 2021-06-05 DIAGNOSIS — M48062 Spinal stenosis, lumbar region with neurogenic claudication: Secondary | ICD-10-CM | POA: Diagnosis not present

## 2021-06-05 DIAGNOSIS — M5416 Radiculopathy, lumbar region: Secondary | ICD-10-CM | POA: Diagnosis not present

## 2021-06-25 DIAGNOSIS — I10 Essential (primary) hypertension: Secondary | ICD-10-CM | POA: Diagnosis not present

## 2021-06-25 DIAGNOSIS — E08621 Diabetes mellitus due to underlying condition with foot ulcer: Secondary | ICD-10-CM | POA: Diagnosis not present

## 2021-06-25 DIAGNOSIS — F1120 Opioid dependence, uncomplicated: Secondary | ICD-10-CM | POA: Diagnosis not present

## 2021-06-25 DIAGNOSIS — G894 Chronic pain syndrome: Secondary | ICD-10-CM | POA: Diagnosis not present

## 2021-06-25 DIAGNOSIS — M79671 Pain in right foot: Secondary | ICD-10-CM | POA: Diagnosis not present

## 2021-06-25 DIAGNOSIS — Z79891 Long term (current) use of opiate analgesic: Secondary | ICD-10-CM | POA: Diagnosis not present

## 2021-06-25 DIAGNOSIS — E0842 Diabetes mellitus due to underlying condition with diabetic polyneuropathy: Secondary | ICD-10-CM | POA: Diagnosis not present

## 2021-06-27 DIAGNOSIS — M5136 Other intervertebral disc degeneration, lumbar region: Secondary | ICD-10-CM | POA: Diagnosis not present

## 2021-06-27 DIAGNOSIS — M48062 Spinal stenosis, lumbar region with neurogenic claudication: Secondary | ICD-10-CM | POA: Diagnosis not present

## 2021-06-27 DIAGNOSIS — M5416 Radiculopathy, lumbar region: Secondary | ICD-10-CM | POA: Diagnosis not present

## 2021-06-29 DIAGNOSIS — M5417 Radiculopathy, lumbosacral region: Secondary | ICD-10-CM | POA: Diagnosis not present

## 2021-06-29 DIAGNOSIS — M6281 Muscle weakness (generalized): Secondary | ICD-10-CM | POA: Diagnosis not present

## 2021-06-29 DIAGNOSIS — M545 Low back pain, unspecified: Secondary | ICD-10-CM | POA: Diagnosis not present

## 2021-06-29 DIAGNOSIS — M2569 Stiffness of other specified joint, not elsewhere classified: Secondary | ICD-10-CM | POA: Diagnosis not present

## 2021-07-03 ENCOUNTER — Other Ambulatory Visit: Payer: Self-pay | Admitting: Cardiovascular Disease

## 2021-07-03 DIAGNOSIS — M5417 Radiculopathy, lumbosacral region: Secondary | ICD-10-CM | POA: Diagnosis not present

## 2021-07-03 DIAGNOSIS — M6281 Muscle weakness (generalized): Secondary | ICD-10-CM | POA: Diagnosis not present

## 2021-07-03 DIAGNOSIS — M2569 Stiffness of other specified joint, not elsewhere classified: Secondary | ICD-10-CM | POA: Diagnosis not present

## 2021-07-03 DIAGNOSIS — M545 Low back pain, unspecified: Secondary | ICD-10-CM | POA: Diagnosis not present

## 2021-07-03 NOTE — Telephone Encounter (Signed)
Please schedule office visit for further refills. Thank you! 

## 2021-07-04 DIAGNOSIS — M5416 Radiculopathy, lumbar region: Secondary | ICD-10-CM | POA: Diagnosis not present

## 2021-07-04 DIAGNOSIS — M48062 Spinal stenosis, lumbar region with neurogenic claudication: Secondary | ICD-10-CM | POA: Diagnosis not present

## 2021-07-09 DIAGNOSIS — M2569 Stiffness of other specified joint, not elsewhere classified: Secondary | ICD-10-CM | POA: Diagnosis not present

## 2021-07-09 DIAGNOSIS — M6281 Muscle weakness (generalized): Secondary | ICD-10-CM | POA: Diagnosis not present

## 2021-07-09 DIAGNOSIS — M5417 Radiculopathy, lumbosacral region: Secondary | ICD-10-CM | POA: Diagnosis not present

## 2021-07-09 DIAGNOSIS — M545 Low back pain, unspecified: Secondary | ICD-10-CM | POA: Diagnosis not present

## 2021-07-11 DIAGNOSIS — M2569 Stiffness of other specified joint, not elsewhere classified: Secondary | ICD-10-CM | POA: Diagnosis not present

## 2021-07-11 DIAGNOSIS — M6281 Muscle weakness (generalized): Secondary | ICD-10-CM | POA: Diagnosis not present

## 2021-07-11 DIAGNOSIS — M545 Low back pain, unspecified: Secondary | ICD-10-CM | POA: Diagnosis not present

## 2021-07-11 DIAGNOSIS — M5417 Radiculopathy, lumbosacral region: Secondary | ICD-10-CM | POA: Diagnosis not present

## 2021-07-18 DIAGNOSIS — M6281 Muscle weakness (generalized): Secondary | ICD-10-CM | POA: Diagnosis not present

## 2021-07-18 DIAGNOSIS — M545 Low back pain, unspecified: Secondary | ICD-10-CM | POA: Diagnosis not present

## 2021-07-18 DIAGNOSIS — M2569 Stiffness of other specified joint, not elsewhere classified: Secondary | ICD-10-CM | POA: Diagnosis not present

## 2021-07-18 DIAGNOSIS — M5417 Radiculopathy, lumbosacral region: Secondary | ICD-10-CM | POA: Diagnosis not present

## 2021-07-20 DIAGNOSIS — M5417 Radiculopathy, lumbosacral region: Secondary | ICD-10-CM | POA: Diagnosis not present

## 2021-07-20 DIAGNOSIS — M545 Low back pain, unspecified: Secondary | ICD-10-CM | POA: Diagnosis not present

## 2021-07-20 DIAGNOSIS — M6281 Muscle weakness (generalized): Secondary | ICD-10-CM | POA: Diagnosis not present

## 2021-07-20 DIAGNOSIS — M2569 Stiffness of other specified joint, not elsewhere classified: Secondary | ICD-10-CM | POA: Diagnosis not present

## 2021-07-25 DIAGNOSIS — M545 Low back pain, unspecified: Secondary | ICD-10-CM | POA: Diagnosis not present

## 2021-07-25 DIAGNOSIS — M6281 Muscle weakness (generalized): Secondary | ICD-10-CM | POA: Diagnosis not present

## 2021-07-25 DIAGNOSIS — M5417 Radiculopathy, lumbosacral region: Secondary | ICD-10-CM | POA: Diagnosis not present

## 2021-07-25 DIAGNOSIS — M2569 Stiffness of other specified joint, not elsewhere classified: Secondary | ICD-10-CM | POA: Diagnosis not present

## 2021-07-30 DIAGNOSIS — M2569 Stiffness of other specified joint, not elsewhere classified: Secondary | ICD-10-CM | POA: Diagnosis not present

## 2021-07-30 DIAGNOSIS — M5417 Radiculopathy, lumbosacral region: Secondary | ICD-10-CM | POA: Diagnosis not present

## 2021-07-30 DIAGNOSIS — M6281 Muscle weakness (generalized): Secondary | ICD-10-CM | POA: Diagnosis not present

## 2021-07-30 DIAGNOSIS — M545 Low back pain, unspecified: Secondary | ICD-10-CM | POA: Diagnosis not present

## 2021-08-01 DIAGNOSIS — M48062 Spinal stenosis, lumbar region with neurogenic claudication: Secondary | ICD-10-CM | POA: Diagnosis not present

## 2021-08-01 DIAGNOSIS — M5416 Radiculopathy, lumbar region: Secondary | ICD-10-CM | POA: Diagnosis not present

## 2021-08-01 DIAGNOSIS — M5136 Other intervertebral disc degeneration, lumbar region: Secondary | ICD-10-CM | POA: Diagnosis not present

## 2021-08-06 DIAGNOSIS — M6281 Muscle weakness (generalized): Secondary | ICD-10-CM | POA: Diagnosis not present

## 2021-08-06 DIAGNOSIS — M2569 Stiffness of other specified joint, not elsewhere classified: Secondary | ICD-10-CM | POA: Diagnosis not present

## 2021-08-06 DIAGNOSIS — M545 Low back pain, unspecified: Secondary | ICD-10-CM | POA: Diagnosis not present

## 2021-08-06 DIAGNOSIS — M5417 Radiculopathy, lumbosacral region: Secondary | ICD-10-CM | POA: Diagnosis not present

## 2021-08-10 DIAGNOSIS — M545 Low back pain, unspecified: Secondary | ICD-10-CM | POA: Diagnosis not present

## 2021-08-10 DIAGNOSIS — M2569 Stiffness of other specified joint, not elsewhere classified: Secondary | ICD-10-CM | POA: Diagnosis not present

## 2021-08-10 DIAGNOSIS — M6281 Muscle weakness (generalized): Secondary | ICD-10-CM | POA: Diagnosis not present

## 2021-08-10 DIAGNOSIS — M5417 Radiculopathy, lumbosacral region: Secondary | ICD-10-CM | POA: Diagnosis not present

## 2021-08-13 DIAGNOSIS — M545 Low back pain, unspecified: Secondary | ICD-10-CM | POA: Diagnosis not present

## 2021-08-13 DIAGNOSIS — M6281 Muscle weakness (generalized): Secondary | ICD-10-CM | POA: Diagnosis not present

## 2021-08-13 DIAGNOSIS — M5417 Radiculopathy, lumbosacral region: Secondary | ICD-10-CM | POA: Diagnosis not present

## 2021-08-13 DIAGNOSIS — M2569 Stiffness of other specified joint, not elsewhere classified: Secondary | ICD-10-CM | POA: Diagnosis not present

## 2021-08-15 DIAGNOSIS — M6281 Muscle weakness (generalized): Secondary | ICD-10-CM | POA: Diagnosis not present

## 2021-08-15 DIAGNOSIS — M2569 Stiffness of other specified joint, not elsewhere classified: Secondary | ICD-10-CM | POA: Diagnosis not present

## 2021-08-15 DIAGNOSIS — M5417 Radiculopathy, lumbosacral region: Secondary | ICD-10-CM | POA: Diagnosis not present

## 2021-08-15 DIAGNOSIS — M545 Low back pain, unspecified: Secondary | ICD-10-CM | POA: Diagnosis not present

## 2021-08-22 DIAGNOSIS — M6281 Muscle weakness (generalized): Secondary | ICD-10-CM | POA: Diagnosis not present

## 2021-08-22 DIAGNOSIS — M5417 Radiculopathy, lumbosacral region: Secondary | ICD-10-CM | POA: Diagnosis not present

## 2021-08-22 DIAGNOSIS — M545 Low back pain, unspecified: Secondary | ICD-10-CM | POA: Diagnosis not present

## 2021-08-22 DIAGNOSIS — M2569 Stiffness of other specified joint, not elsewhere classified: Secondary | ICD-10-CM | POA: Diagnosis not present

## 2021-08-24 DIAGNOSIS — M6281 Muscle weakness (generalized): Secondary | ICD-10-CM | POA: Diagnosis not present

## 2021-08-24 DIAGNOSIS — M2569 Stiffness of other specified joint, not elsewhere classified: Secondary | ICD-10-CM | POA: Diagnosis not present

## 2021-08-24 DIAGNOSIS — M5417 Radiculopathy, lumbosacral region: Secondary | ICD-10-CM | POA: Diagnosis not present

## 2021-08-24 DIAGNOSIS — M545 Low back pain, unspecified: Secondary | ICD-10-CM | POA: Diagnosis not present

## 2021-08-28 ENCOUNTER — Other Ambulatory Visit: Payer: Self-pay | Admitting: Cardiovascular Disease

## 2021-09-03 DIAGNOSIS — M2569 Stiffness of other specified joint, not elsewhere classified: Secondary | ICD-10-CM | POA: Diagnosis not present

## 2021-09-03 DIAGNOSIS — M545 Low back pain, unspecified: Secondary | ICD-10-CM | POA: Diagnosis not present

## 2021-09-03 DIAGNOSIS — M6281 Muscle weakness (generalized): Secondary | ICD-10-CM | POA: Diagnosis not present

## 2021-09-03 DIAGNOSIS — M5417 Radiculopathy, lumbosacral region: Secondary | ICD-10-CM | POA: Diagnosis not present

## 2021-09-06 DIAGNOSIS — M545 Low back pain, unspecified: Secondary | ICD-10-CM | POA: Diagnosis not present

## 2021-09-06 DIAGNOSIS — M2569 Stiffness of other specified joint, not elsewhere classified: Secondary | ICD-10-CM | POA: Diagnosis not present

## 2021-09-06 DIAGNOSIS — M5417 Radiculopathy, lumbosacral region: Secondary | ICD-10-CM | POA: Diagnosis not present

## 2021-09-06 DIAGNOSIS — M6281 Muscle weakness (generalized): Secondary | ICD-10-CM | POA: Diagnosis not present

## 2021-09-10 DIAGNOSIS — G8929 Other chronic pain: Secondary | ICD-10-CM | POA: Diagnosis not present

## 2021-09-10 DIAGNOSIS — M79671 Pain in right foot: Secondary | ICD-10-CM | POA: Diagnosis not present

## 2021-09-10 DIAGNOSIS — F1120 Opioid dependence, uncomplicated: Secondary | ICD-10-CM | POA: Diagnosis not present

## 2021-09-10 DIAGNOSIS — E08621 Diabetes mellitus due to underlying condition with foot ulcer: Secondary | ICD-10-CM | POA: Diagnosis not present

## 2021-09-10 DIAGNOSIS — M6281 Muscle weakness (generalized): Secondary | ICD-10-CM | POA: Diagnosis not present

## 2021-09-10 DIAGNOSIS — G894 Chronic pain syndrome: Secondary | ICD-10-CM | POA: Diagnosis not present

## 2021-09-10 DIAGNOSIS — Z79891 Long term (current) use of opiate analgesic: Secondary | ICD-10-CM | POA: Diagnosis not present

## 2021-09-10 DIAGNOSIS — E0842 Diabetes mellitus due to underlying condition with diabetic polyneuropathy: Secondary | ICD-10-CM | POA: Diagnosis not present

## 2021-09-10 DIAGNOSIS — M2569 Stiffness of other specified joint, not elsewhere classified: Secondary | ICD-10-CM | POA: Diagnosis not present

## 2021-09-10 DIAGNOSIS — I10 Essential (primary) hypertension: Secondary | ICD-10-CM | POA: Diagnosis not present

## 2021-09-10 DIAGNOSIS — M545 Low back pain, unspecified: Secondary | ICD-10-CM | POA: Diagnosis not present

## 2021-09-10 DIAGNOSIS — M5417 Radiculopathy, lumbosacral region: Secondary | ICD-10-CM | POA: Diagnosis not present

## 2021-09-11 ENCOUNTER — Other Ambulatory Visit: Payer: Self-pay

## 2021-09-11 ENCOUNTER — Encounter: Payer: Self-pay | Admitting: Cardiovascular Disease

## 2021-09-11 ENCOUNTER — Ambulatory Visit: Payer: Medicare HMO | Admitting: Cardiovascular Disease

## 2021-09-11 VITALS — BP 150/60 | HR 72 | Ht 70.0 in | Wt 182.4 lb

## 2021-09-11 DIAGNOSIS — I447 Left bundle-branch block, unspecified: Secondary | ICD-10-CM | POA: Diagnosis not present

## 2021-09-11 DIAGNOSIS — I25118 Atherosclerotic heart disease of native coronary artery with other forms of angina pectoris: Secondary | ICD-10-CM | POA: Diagnosis not present

## 2021-09-11 DIAGNOSIS — I10 Essential (primary) hypertension: Secondary | ICD-10-CM

## 2021-09-11 DIAGNOSIS — E785 Hyperlipidemia, unspecified: Secondary | ICD-10-CM

## 2021-09-11 MED ORDER — ISOSORBIDE MONONITRATE ER 30 MG PO TB24: 30.0000 mg | ORAL_TABLET | Freq: Every day | ORAL | 2 refills | Status: DC

## 2021-09-11 NOTE — Progress Notes (Signed)
Cardiology Office Note   Date:  09/11/2021   ID:  [name], DOB 12/02/1945, MRN [id number]  PCP:  Danella Penton, MD  Cardiologist:   Lorine Bears, MD   Chief Complaint  Patient presents with   Other    6 month f/u no complaints today. Meds reviewed verbally with pt.      History of Present Illness: [name] is a 75 y.o. female who presents for a follow-up visit regarding chest pain.  She has known history of left bundle branch block, essential hypertension, hyperlipidemia and diabetes mellitus.  She had previous cardiac catheterization in 2008 which showed minor irregularities.   She was hospitalized in December 2021 with atypical chest pain.  Troponin was normal.  Echocardiogram showed normal LV systolic function.  Lexiscan Myoview showed no evidence of ischemia.  Coronary calcifications noted in the LAD distribution. She has symptoms suggestive of class II angina with exertional chest pain and shortness of breath.  She reports that her symptoms overall has been stable with no significant change over the last 6 months. Her blood pressure has been running high over the last few weeks.    Past Medical History:  Diagnosis Date   Chest pain    a. reports multiple nl stress tests and nl cath in 2008.   Diabetes mellitus without complication (HCC)    History of echocardiogram    a. 07/2013 Echo: EF 50-55%. No rwma. Gr1 DD. Abnl septal motion consistent w/ LBBB. Mild MR/TR.   Hyperlipidemia    Hypertension    LBBB (left bundle branch block)     Past Surgical History:  Procedure Laterality Date   endovascular laser N/A      Current Outpatient Medications  Medication Sig Dispense Refill   aspirin EC 81 MG tablet Take 81 mg by mouth daily. Swallow whole.     Cholecalciferol (VITAMIN D3) 25 MCG (1000 UT) CAPS Take 1,000 Units by mouth daily.     cyanocobalamin (,VITAMIN B-12,) 1000 MCG/ML injection Inject into the muscle.     diclofenac Sodium (VOLTAREN) 1 % GEL  Apply 2 g topically in the morning, at noon, in the evening, and at bedtime.     etodolac (LODINE) 400 MG tablet Take 400 mg by mouth daily.     furosemide (LASIX) 20 MG tablet Take by mouth daily.     gabapentin (NEURONTIN) 300 MG capsule Take 300 mg by mouth 3 (three) times daily.      glimepiride (AMARYL) 4 MG tablet Take 2 mg by mouth daily with breakfast.     JARDIANCE 10 MG TABS tablet Take 10 mg by mouth daily.     metFORMIN (GLUCOPHAGE-XR) 500 MG 24 hr tablet Take 1,000 mg by mouth every evening.     metoprolol succinate (TOPROL-XL) 50 MG 24 hr tablet Take 25 mg by mouth 2 (two) times daily.  0   nitroGLYCERIN (NITROSTAT) 0.4 MG SL tablet Place 1 tablet (0.4 mg total) under the tongue every 5 (five) minutes as needed for chest pain (no more than 3 tablets in a 15 minute period). 30 tablet 3   olmesartan (BENICAR) 40 MG tablet Take 40 mg by mouth daily.     omeprazole (PRILOSEC) 20 MG capsule Take 20 mg by mouth daily.     oxyCODONE (OXY IR/ROXICODONE) 5 MG immediate release tablet Take 5 mg by mouth every 8 (eight) hours as needed.     rosuvastatin (CRESTOR) 10 MG tablet Take 10 mg by  mouth daily.     triamcinolone cream (KENALOG) 0.1 % Apply 1 application topically 2 (two) times daily.     isosorbide mononitrate (IMDUR) 30 MG 24 hr tablet Take 1 tablet (30 mg total) by mouth daily. 90 tablet 2   No current facility-administered medications for this visit.    Allergies:   Patient has no known allergies.    Social History:  The patient  reports that she quit smoking about 2 years ago. She has a 20.00 pack-year smoking history. She has never used smokeless tobacco. She reports that she does not drink alcohol and does not use drugs.   Family History:  The patient's family history includes Breast cancer in her maternal aunt; Celiac disease in her brother; Dementia in her mother; Heart attack in her father; Liver cancer in her sister.    ROS:  Please see the history of present illness.    Otherwise, review of systems are positive for none.   All other systems are reviewed and negative.    PHYSICAL EXAM: VS:  BP (!) 150/60 (BP Location: Left Arm, Patient Position: Sitting, Cuff Size: Normal)   Pulse 72   Ht 5\' 10"  (1.778 m)   Wt 182 lb 6 oz (82.7 kg)   SpO2 98%   BMI 26.17 kg/m  , BMI Body mass index is 26.17 kg/m. GEN: Well nourished, well developed, in no acute distress  HEENT: normal  Neck: no JVD, carotid bruits, or masses Cardiac: RRR; no murmurs, rubs, or gallops,no edema  Respiratory:  clear to auscultation bilaterally, normal work of breathing GI: soft, nontender, nondistended, + BS MS: no deformity or atrophy  Skin: warm and dry, no rash Neuro:  Strength and sensation are intact Psych: euthymic mood, full affect   EKG:  EKG is ordered today. EKG showed normal sinus rhythm with left bundle branch block.   Recent Labs: 09/14/2020: BUN 36; Creatinine, Ser 1.02; Hemoglobin 14.6; Platelets 237; Potassium 4.2; Sodium 137; TSH 1.267    Lipid Panel    Component Value Date/Time   CHOL 122 09/14/2020 1738   TRIG 151 (H) 09/14/2020 1738   HDL 49 09/14/2020 1738   CHOLHDL 2.5 09/14/2020 1738   VLDL 30 09/14/2020 1738   LDLCALC 43 09/14/2020 1738      Wt Readings from Last 3 Encounters:  09/11/21 182 lb 6 oz (82.7 kg)  12/28/20 185 lb (83.9 kg)  09/25/20 182 lb 6 oz (82.7 kg)      No flowsheet data found.    ASSESSMENT AND PLAN:  1.  Class II angina: The patient continues to have stable class II angina with no significant change.  She feels that the symptoms are not lifestyle limiting at this point.  Thus, continue medical therapy.  Given that her blood pressure is elevated, I elected to increase Imdur to 30 mg daily.    2.  Essential hypertension: Blood pressures is elevated.  Imdur was increased.  3.  Hyperlipidemia: Continue treatment with rosuvastatin.  Most recent lipid profile showed an LDL of 43.  4.  Left bundle branch block: No  symptoms.  5.  Peripheral neuropathy: She has a lot of leg symptoms.  Distal pulses are normal and I do not think she has peripheral arterial disease.    Disposition:   FU with me in 6 months  Signed,  09/27/20, MD  09/11/2021 5:26 PM    [facility]

## 2021-09-11 NOTE — Patient Instructions (Signed)
Medication Instructions:  Your physician has recommended you make the following change in your medication:   INCREASE Imdur (isosorbide) to 30 mg daily. An Rx has been sent to your pharmacy.  *If you need a refill on your cardiac medications before your next appointment, please call your pharmacy*   Lab Work: None ordered  If you have labs (blood work) drawn today and your tests are completely normal, you will receive your results only by: MyChart Message (if you have MyChart) OR A paper copy in the mail If you have any lab test that is abnormal or we need to change your treatment, we will call you to review the results.   Testing/Procedures: None ordered   Follow-Up: At [facility], you and your health needs are our priority.  As part of our continuing mission to provide you with exceptional heart care, we have created designated Provider Care Teams.  These Care Teams include your primary Cardiologist (physician) and Advanced Practice Providers (APPs -  Physician Assistants and Nurse Practitioners) who all work together to provide you with the care you need, when you need it.  We recommend signing up for the patient portal called "MyChart".  Sign up information is provided on this After Visit Summary.  MyChart is used to connect with patients for Virtual Visits (Telemedicine).  Patients are able to view lab/test results, encounter notes, upcoming appointments, etc.  Non-urgent messages can be sent to your provider as well.   To learn more about what you can do with MyChart, go to [email or web address].    Your next appointment:   6 month(s)  The format for your next appointment:   In Person  Provider:   You may see Lorine Bears, MD or one of the following Advanced Practice Providers on your designated Care Team:   Nicolasa Ducking, NP Eula Listen, PA-C Cadence Fransico Michael, PA-C1}    Other Instructions N/A

## 2021-09-12 DIAGNOSIS — M6281 Muscle weakness (generalized): Secondary | ICD-10-CM | POA: Diagnosis not present

## 2021-09-12 DIAGNOSIS — M545 Low back pain, unspecified: Secondary | ICD-10-CM | POA: Diagnosis not present

## 2021-09-12 DIAGNOSIS — M2569 Stiffness of other specified joint, not elsewhere classified: Secondary | ICD-10-CM | POA: Diagnosis not present

## 2021-09-12 DIAGNOSIS — M5417 Radiculopathy, lumbosacral region: Secondary | ICD-10-CM | POA: Diagnosis not present

## 2021-10-22 DIAGNOSIS — G894 Chronic pain syndrome: Secondary | ICD-10-CM | POA: Diagnosis not present

## 2021-10-24 DIAGNOSIS — M48062 Spinal stenosis, lumbar region with neurogenic claudication: Secondary | ICD-10-CM | POA: Diagnosis not present

## 2021-10-24 DIAGNOSIS — I110 Hypertensive heart disease with heart failure: Secondary | ICD-10-CM | POA: Diagnosis not present

## 2021-10-24 DIAGNOSIS — I509 Heart failure, unspecified: Secondary | ICD-10-CM | POA: Diagnosis not present

## 2021-10-24 DIAGNOSIS — M5416 Radiculopathy, lumbar region: Secondary | ICD-10-CM | POA: Diagnosis not present

## 2021-10-24 DIAGNOSIS — M5136 Other intervertebral disc degeneration, lumbar region: Secondary | ICD-10-CM | POA: Diagnosis not present

## 2021-11-05 DIAGNOSIS — R69 Illness, unspecified: Secondary | ICD-10-CM | POA: Diagnosis not present

## 2021-11-21 DIAGNOSIS — E1140 Type 2 diabetes mellitus with diabetic neuropathy, unspecified: Secondary | ICD-10-CM | POA: Diagnosis not present

## 2021-11-21 DIAGNOSIS — E538 Deficiency of other specified B group vitamins: Secondary | ICD-10-CM | POA: Diagnosis not present

## 2021-11-28 DIAGNOSIS — E11622 Type 2 diabetes mellitus with other skin ulcer: Secondary | ICD-10-CM | POA: Diagnosis not present

## 2021-11-28 DIAGNOSIS — Z0000 Encounter for general adult medical examination without abnormal findings: Secondary | ICD-10-CM | POA: Diagnosis not present

## 2021-11-28 DIAGNOSIS — Z1389 Encounter for screening for other disorder: Secondary | ICD-10-CM | POA: Diagnosis not present

## 2021-11-28 DIAGNOSIS — E1142 Type 2 diabetes mellitus with diabetic polyneuropathy: Secondary | ICD-10-CM | POA: Diagnosis not present

## 2021-11-28 DIAGNOSIS — L97909 Non-pressure chronic ulcer of unspecified part of unspecified lower leg with unspecified severity: Secondary | ICD-10-CM | POA: Diagnosis not present

## 2021-12-27 DIAGNOSIS — F1120 Opioid dependence, uncomplicated: Secondary | ICD-10-CM | POA: Diagnosis not present

## 2021-12-27 DIAGNOSIS — G894 Chronic pain syndrome: Secondary | ICD-10-CM | POA: Diagnosis not present

## 2022-01-07 DIAGNOSIS — M79671 Pain in right foot: Secondary | ICD-10-CM | POA: Diagnosis not present

## 2022-01-07 DIAGNOSIS — G8929 Other chronic pain: Secondary | ICD-10-CM | POA: Diagnosis not present

## 2022-01-07 DIAGNOSIS — G894 Chronic pain syndrome: Secondary | ICD-10-CM | POA: Diagnosis not present

## 2022-01-07 DIAGNOSIS — E0842 Diabetes mellitus due to underlying condition with diabetic polyneuropathy: Secondary | ICD-10-CM | POA: Diagnosis not present

## 2022-01-07 DIAGNOSIS — I10 Essential (primary) hypertension: Secondary | ICD-10-CM | POA: Diagnosis not present

## 2022-01-07 DIAGNOSIS — E08621 Diabetes mellitus due to underlying condition with foot ulcer: Secondary | ICD-10-CM | POA: Diagnosis not present

## 2022-01-07 DIAGNOSIS — Z79891 Long term (current) use of opiate analgesic: Secondary | ICD-10-CM | POA: Diagnosis not present

## 2022-01-07 DIAGNOSIS — F1120 Opioid dependence, uncomplicated: Secondary | ICD-10-CM | POA: Diagnosis not present

## 2022-01-09 DIAGNOSIS — E0842 Diabetes mellitus due to underlying condition with diabetic polyneuropathy: Secondary | ICD-10-CM | POA: Diagnosis not present

## 2022-01-09 DIAGNOSIS — G894 Chronic pain syndrome: Secondary | ICD-10-CM | POA: Diagnosis not present

## 2022-01-09 DIAGNOSIS — M79671 Pain in right foot: Secondary | ICD-10-CM | POA: Diagnosis not present

## 2022-01-09 DIAGNOSIS — G8929 Other chronic pain: Secondary | ICD-10-CM | POA: Diagnosis not present

## 2022-01-09 DIAGNOSIS — I10 Essential (primary) hypertension: Secondary | ICD-10-CM | POA: Diagnosis not present

## 2022-01-09 DIAGNOSIS — F1120 Opioid dependence, uncomplicated: Secondary | ICD-10-CM | POA: Diagnosis not present

## 2022-01-09 DIAGNOSIS — E08621 Diabetes mellitus due to underlying condition with foot ulcer: Secondary | ICD-10-CM | POA: Diagnosis not present

## 2022-01-09 DIAGNOSIS — Z79891 Long term (current) use of opiate analgesic: Secondary | ICD-10-CM | POA: Diagnosis not present

## 2022-01-17 DIAGNOSIS — G894 Chronic pain syndrome: Secondary | ICD-10-CM | POA: Diagnosis not present

## 2022-01-17 DIAGNOSIS — F1110 Opioid abuse, uncomplicated: Secondary | ICD-10-CM | POA: Diagnosis not present

## 2022-02-04 DIAGNOSIS — E0842 Diabetes mellitus due to underlying condition with diabetic polyneuropathy: Secondary | ICD-10-CM | POA: Diagnosis not present

## 2022-02-04 DIAGNOSIS — E08621 Diabetes mellitus due to underlying condition with foot ulcer: Secondary | ICD-10-CM | POA: Diagnosis not present

## 2022-02-04 DIAGNOSIS — G894 Chronic pain syndrome: Secondary | ICD-10-CM | POA: Diagnosis not present

## 2022-02-04 DIAGNOSIS — F1120 Opioid dependence, uncomplicated: Secondary | ICD-10-CM | POA: Diagnosis not present

## 2022-02-04 DIAGNOSIS — M79671 Pain in right foot: Secondary | ICD-10-CM | POA: Diagnosis not present

## 2022-02-04 DIAGNOSIS — I10 Essential (primary) hypertension: Secondary | ICD-10-CM | POA: Diagnosis not present

## 2022-02-04 DIAGNOSIS — Z79891 Long term (current) use of opiate analgesic: Secondary | ICD-10-CM | POA: Diagnosis not present

## 2022-02-04 DIAGNOSIS — G8929 Other chronic pain: Secondary | ICD-10-CM | POA: Diagnosis not present

## 2022-03-06 DIAGNOSIS — Z79891 Long term (current) use of opiate analgesic: Secondary | ICD-10-CM | POA: Diagnosis not present

## 2022-03-06 DIAGNOSIS — G8929 Other chronic pain: Secondary | ICD-10-CM | POA: Diagnosis not present

## 2022-03-06 DIAGNOSIS — E08621 Diabetes mellitus due to underlying condition with foot ulcer: Secondary | ICD-10-CM | POA: Diagnosis not present

## 2022-03-06 DIAGNOSIS — M79671 Pain in right foot: Secondary | ICD-10-CM | POA: Diagnosis not present

## 2022-03-06 DIAGNOSIS — F1120 Opioid dependence, uncomplicated: Secondary | ICD-10-CM | POA: Diagnosis not present

## 2022-03-06 DIAGNOSIS — E0842 Diabetes mellitus due to underlying condition with diabetic polyneuropathy: Secondary | ICD-10-CM | POA: Diagnosis not present

## 2022-03-06 DIAGNOSIS — G894 Chronic pain syndrome: Secondary | ICD-10-CM | POA: Diagnosis not present

## 2022-03-06 DIAGNOSIS — I10 Essential (primary) hypertension: Secondary | ICD-10-CM | POA: Diagnosis not present

## 2022-03-19 ENCOUNTER — Encounter: Payer: Self-pay | Admitting: Cardiovascular Disease

## 2022-03-19 ENCOUNTER — Ambulatory Visit: Payer: Medicare HMO | Admitting: Cardiovascular Disease

## 2022-03-19 ENCOUNTER — Other Ambulatory Visit
Admission: RE | Admit: 2022-03-19 | Discharge: 2022-03-19 | Disposition: A | Discharge status: Home / Self Care | Payer: Medicare HMO | Source: Ambulatory Visit | Attending: Cardiovascular Disease | Admitting: Cardiovascular Disease

## 2022-03-19 VITALS — BP 120/78 | HR 73 | Ht 70.0 in | Wt 185.2 lb

## 2022-03-19 DIAGNOSIS — I209 Angina pectoris, unspecified: Secondary | ICD-10-CM

## 2022-03-19 DIAGNOSIS — I200 Unstable angina: Secondary | ICD-10-CM

## 2022-03-19 DIAGNOSIS — I447 Left bundle-branch block, unspecified: Secondary | ICD-10-CM | POA: Diagnosis not present

## 2022-03-19 DIAGNOSIS — I10 Essential (primary) hypertension: Secondary | ICD-10-CM

## 2022-03-19 DIAGNOSIS — E785 Hyperlipidemia, unspecified: Secondary | ICD-10-CM | POA: Diagnosis not present

## 2022-03-19 LAB — BASIC METABOLIC PANEL
Anion gap: 6 (ref 5–15)
BUN: 19 mg/dL (ref 8–23)
CO2: 26 mmol/L (ref 22–32)
Calcium: 9.6 mg/dL (ref 8.9–10.3)
Chloride: 111 mmol/L (ref 98–111)
Creatinine, Ser: 0.83 mg/dL (ref 0.44–1.00)
GFR, Estimated: >60 mL/min (ref >60)
Glucose, Bld: 125 mg/dL — ABNORMAL HIGH (ref 70–99)
Potassium: 4 mmol/L (ref 3.5–5.1)
Sodium: 143 mmol/L (ref 135–145)

## 2022-03-19 LAB — CBC WITH DIFFERENTIAL/PLATELET
Abs Immature Granulocytes: 0.02 10*3/uL (ref 0.00–0.07)
Basophils Absolute: 0.1 10*3/uL (ref 0.0–0.1)
Basophils Relative: 1 %
Eosinophils Absolute: 0.2 10*3/uL (ref 0.0–0.5)
Eosinophils Relative: 3 %
HCT: 35.8 % — ABNORMAL LOW (ref 36.0–46.0)
Hemoglobin: 12.1 g/dL (ref 12.0–15.0)
Immature Granulocytes: 0 %
Lymphocytes Relative: 36 %
Lymphs Abs: 1.9 10*3/uL (ref 0.7–4.0)
MCH: 29.4 pg (ref 26.0–34.0)
MCHC: 33.8 g/dL (ref 30.0–36.0)
MCV: 87.1 fL (ref 80.0–100.0)
Monocytes Absolute: 0.4 10*3/uL (ref 0.1–1.0)
Monocytes Relative: 8 %
Neutro Abs: 2.7 10*3/uL (ref 1.7–7.7)
Neutrophils Relative %: 52 %
Platelets: 194 10*3/uL (ref 150–400)
RBC: 4.11 MIL/uL (ref 3.87–5.11)
RDW: 12.6 % (ref 11.5–15.5)
WBC: 5.2 10*3/uL (ref 4.0–10.5)
nRBC: 0 % (ref 0.0–0.2)

## 2022-03-19 NOTE — Progress Notes (Unsigned)
Cardiology Office Note   Date:  03/20/2022   ID:  [name], DOB 1946/08/25, MRN [id number]  PCP:  Rusty Aus, MD  Cardiologist:   Kathlyn Sacramento, MD   Chief Complaint  Patient presents with   Other    6 Month f/u c/o fatigue difficulty functioning and chest pain took nitro. Meds reviewed verbally with pt.      History of Present Illness: [name] is a 76 y.o. female who presents for a follow-up visit regarding chronic stable angina.  She has known history of left bundle branch block, essential hypertension, hyperlipidemia and diabetes mellitus.  She had previous cardiac catheterization in 2008 which showed minor irregularities.   She was hospitalized in December 2021 with atypical chest pain.  Troponin was normal.  Echocardiogram showed normal LV systolic function.  Lexiscan Myoview showed no evidence of ischemia.  Coronary calcifications noted in the LAD distribution. The patient has been followed for many years for class II angina which has been stable on medication.  During her most recent office visit, her blood pressure was elevated and thus I increased her Imdur to 30 mg daily. Over the last 2 to 3 months, her symptoms change and she started having significant exertional shortness of breath, chest pain and extreme fatigue.  Over the last 2 weeks, she experienced multiple episodes of chest pain requiring nitroglycerin.  She had a prolonged episode on Sunday.  She had another episode that woke her up from sleep and responded to nitroglycerin.  She is under significant stress due to illness of her husband.   Past Medical History:  Diagnosis Date   Chest pain    a. reports multiple nl stress tests and nl cath in 2008.   Diabetes mellitus without complication ([place])    History of echocardiogram    a. 07/2013 Echo: EF 50-55%. No rwma. Gr1 DD. Abnl septal motion consistent w/ LBBB. Mild MR/TR.   Hyperlipidemia    Hypertension    LBBB (left bundle branch block)      Past Surgical History:  Procedure Laterality Date   endovascular laser N/A      Current Outpatient Medications  Medication Sig Dispense Refill   aspirin EC 81 MG tablet Take 81 mg by mouth daily. Swallow whole.     Cholecalciferol (VITAMIN D3) 25 MCG (1000 UT) CAPS Take 1,000 Units by mouth daily.     cyanocobalamin (,VITAMIN B-12,) 1000 MCG/ML injection Inject into the muscle.     diclofenac Sodium (VOLTAREN) 1 % GEL Apply 2 g topically in the morning, at noon, in the evening, and at bedtime.     etodolac (LODINE) 400 MG tablet Take 400 mg by mouth daily.     furosemide (LASIX) 20 MG tablet Take by mouth daily.     gabapentin (NEURONTIN) 300 MG capsule Take 300 mg by mouth 3 (three) times daily.      glimepiride (AMARYL) 4 MG tablet Take 2 mg by mouth daily with breakfast.     isosorbide mononitrate (IMDUR) 30 MG 24 hr tablet Take 1 tablet (30 mg total) by mouth daily. 90 tablet 2   metFORMIN (GLUCOPHAGE-XR) 500 MG 24 hr tablet Take 1,000 mg by mouth every evening.     metoprolol succinate (TOPROL-XL) 50 MG 24 hr tablet Take 25 mg by mouth 2 (two) times daily.  0   nitroGLYCERIN (NITROSTAT) 0.4 MG SL tablet Place 1 tablet (0.4 mg total) under the tongue every 5 (five) minutes as needed  for chest pain (no more than 3 tablets in a 15 minute period). 30 tablet 3   olmesartan (BENICAR) 40 MG tablet Take 40 mg by mouth daily.     omeprazole (PRILOSEC) 20 MG capsule Take 20 mg by mouth daily.     oxyCODONE (OXY IR/ROXICODONE) 5 MG immediate release tablet Take 5 mg by mouth every 8 (eight) hours as needed.     rosuvastatin (CRESTOR) 10 MG tablet Take 10 mg by mouth daily.     triamcinolone cream (KENALOG) 0.1 % Apply 1 application topically 2 (two) times daily.     OZEMPIC, 0.25 OR 0.5 MG/DOSE, 2 MG/3ML SOPN as directed.     No current facility-administered medications for this visit.    Allergies:   Patient has no known allergies.    Social History:  The patient  reports that she  quit smoking about 3 years ago. She has a 20.00 pack-year smoking history. She has never used smokeless tobacco. She reports that she does not drink alcohol and does not use drugs.   Family History:  The patient's family history includes Breast cancer in her maternal aunt; Celiac disease in her brother; Dementia in her mother; Heart attack in her father; Liver cancer in her sister.    ROS:  Please see the history of present illness.   Otherwise, review of systems are positive for none.   All other systems are reviewed and negative.    PHYSICAL EXAM: VS:  BP 120/78 (BP Location: Left Arm, Patient Position: Sitting, Cuff Size: Normal)   Pulse 73   Ht 5\' 10"  (1.778 m)   Wt 185 lb 4 oz (84 kg)   SpO2 97%   BMI 26.58 kg/m  , BMI Body mass index is 26.58 kg/m. GEN: Well nourished, well developed, in no acute distress  HEENT: normal  Neck: no JVD, carotid bruits, or masses Cardiac: RRR; no murmurs, rubs, or gallops,no edema  Respiratory:  clear to auscultation bilaterally, normal work of breathing GI: soft, nontender, nondistended, + BS MS: no deformity or atrophy  Skin: warm and dry, no rash Neuro:  Strength and sensation are intact Psych: euthymic mood, full affect Radial pulse is normal.  EKG:  EKG is ordered today. EKG showed normal sinus rhythm with left bundle branch block.   Recent Labs: 03/19/2022: BUN 19; Creatinine, Ser 0.83; Hemoglobin 12.1; Platelets 194; Potassium 4.0; Sodium 143    Lipid Panel    Component Value Date/Time   CHOL 122 09/14/2020 1738   TRIG 151 (H) 09/14/2020 1738   HDL 49 09/14/2020 1738   CHOLHDL 2.5 09/14/2020 1738   VLDL 30 09/14/2020 1738   LDLCALC 43 09/14/2020 1738      Wt Readings from Last 3 Encounters:  03/19/22 185 lb 4 oz (84 kg)  09/11/21 182 lb 6 oz (82.7 kg)  12/28/20 185 lb (83.9 kg)          No data to display            ASSESSMENT AND PLAN:  1.  Unstable angina: The patient had stable class II angina for many  years but recently started having episodes at rest requiring nitroglycerin.  She had one prolonged episode on Sunday and another episode that woke her up from sleep.  This is very concerning.  I recommend urgent evaluation with left heart catheterization possible PCI.  I discussed the procedure in details as well as risk and benefits.  Continue same cardiac medications for now.  2.  Essential hypertension: Blood pressure is well controlled on current medications.  3.  Hyperlipidemia: Continue treatment with rosuvastatin.  Most recent lipid profile showed an LDL of 43.  4.  Left bundle branch block: No significant change from before.  5.  Peripheral neuropathy: She has a lot of leg symptoms.  Distal pulses are normal and I do not think she has peripheral arterial disease.    Disposition:   Proceed with urgent left heart catheterization.  Signed,  Kathlyn Sacramento, MD  03/20/2022 1:38 PM    [place]

## 2022-03-19 NOTE — Patient Instructions (Signed)
Medication Instructions:  Your physician recommends that you continue on your current medications as directed. Please refer to the Current Medication list given to you today.  *If you need a refill on your cardiac medications before your next appointment, please call your pharmacy*   Lab Work: Bmp and Cbc today.  Please have your labs drawn today at the [facility]. Stop at the Registration desk to check in.  If you have labs (blood work) drawn today and your tests are completely normal, you will receive your results only by: MyChart Message (if you have MyChart) OR A paper copy in the mail If you have any lab test that is abnormal or we need to change your treatment, we will call you to review the results.   Testing/Procedures: Your physician has requested that you have a cardiac catheterization. Cardiac catheterization is used to diagnose and/or treat various heart conditions. Doctors may recommend this procedure for a number of different reasons. The most common reason is to evaluate chest pain. Chest pain can be a symptom of coronary artery disease (CAD), and cardiac catheterization can show whether plaque is narrowing or blocking your heart's arteries. This procedure is also used to evaluate the valves, as well as measure the blood flow and oxygen levels in different parts of your heart. For further information please visit [email or web address] Please follow instruction sheet, as given.    Follow-Up: At [facility], you and your health needs are our priority.  As part of our continuing mission to provide you with exceptional heart care, we have created designated Provider Care Teams.  These Care Teams include your primary Cardiologist (physician) and Advanced Practice Providers (APPs -  Physician Assistants and Nurse Practitioners) who all work together to provide you with the care you need, when you need it.  We recommend signing up for the patient portal called "MyChart".  Sign up  information is provided on this After Visit Summary.  MyChart is used to connect with patients for Virtual Visits (Telemedicine).  Patients are able to view lab/test results, encounter notes, upcoming appointments, etc.  Non-urgent messages can be sent to your provider as well.   To learn more about what you can do with MyChart, go to [email or web address].    Your next appointment:   4 week(s)  The format for your next appointment:   In Person  Provider:   You may see Marcia Bears, MD or one of the following Advanced Practice Providers on your designated Care Team:   Nicolasa Ducking, NP Eula Listen, PA-C Cadence Fransico Michael, [place]    Other Instructions  [facility] CARDIOVASCULAR DIVISION [facility] [address] [place] [address] Dept: [phone] Loc: [phone]  [name]  03/19/2022  You are scheduled for a Cardiac Catheterization on Friday, June 16 with Dr. Lorine [name].  1. Please arrive at the [facility]. [address] , [place], [place] [address] at 8:30 AM (This time is one hour before your procedure to ensure your preparation). Free valet parking service is available.   Special note: Every effort is made to have your procedure done on time. Please understand that emergencies sometimes delay scheduled procedures.  2. Diet: Do not eat solid foods after midnight.  You may have clear liquids until 5 AM upon the day of the procedure.  3. Labs: You will need to have blood drawn on Tuesday, June 13 at [facility] Entrance, Go to [employer]  on your right to register.  Address: [address] [place], [place] [address]  Open: 8am - 5pm  Phone: [phone]. You do not need to be fasting.  4. Medication instructions in preparation for your procedure:   Contrast Allergy: No   Do not take Diabetes Med Glucophage (Metformin) on the day of the procedure and HOLD 48 HOURS AFTER THE  PROCEDURE.  On the morning of your procedure, take Aspirin and any morning medicines NOT listed above.  You may use sips of water.  5. Plan to go home the same day, you will only stay overnight if medically necessary. 6. You MUST have a responsible adult to drive you home. 7. An adult MUST be with you the first 24 hours after you arrive home. 8. Bring a current list of your medications, and the last time and date medication taken. 9. Bring ID and current insurance cards. 10.Please wear clothes that are easy to get on and off and wear slip-on shoes.  Thank you for allowing [place] to care for you!   -- [facility]   Important Information About Sugar

## 2022-03-19 NOTE — H&P (View-Only) (Signed)
Cardiology Office Note   Date:  03/20/2022   ID:  [name], DOB 1946/08/25, MRN [id number]  PCP:  Marcia Aus, MD  Cardiologist:   Marcia Sacramento, MD   Chief Complaint  Patient presents with   Other    6 Month f/u c/o fatigue difficulty functioning and chest pain took nitro. Meds reviewed verbally with pt.      History of Present Illness: [name] is a 76 y.o. female who presents for a follow-up visit regarding chronic stable angina.  She has known history of left bundle branch block, essential hypertension, hyperlipidemia and diabetes mellitus.  She had previous cardiac catheterization in 2008 which showed minor irregularities.   She was hospitalized in December 2021 with atypical chest pain.  Troponin was normal.  Echocardiogram showed normal LV systolic function.  Lexiscan Myoview showed no evidence of ischemia.  Coronary calcifications noted in the LAD distribution. The patient has been followed for many years for class II angina which has been stable on medication.  During her most recent office visit, her blood pressure was elevated and thus I increased her Imdur to 30 mg daily. Over the last 2 to 3 months, her symptoms change and she started having significant exertional shortness of breath, chest pain and extreme fatigue.  Over the last 2 weeks, she experienced multiple episodes of chest pain requiring nitroglycerin.  She had a prolonged episode on Sunday.  She had another episode that woke her up from sleep and responded to nitroglycerin.  She is under significant stress due to illness of her husband.   Past Medical History:  Diagnosis Date   Chest pain    a. reports multiple nl stress tests and nl cath in 2008.   Diabetes mellitus without complication ([place])    History of echocardiogram    a. 07/2013 Echo: EF 50-55%. No rwma. Gr1 DD. Abnl septal motion consistent w/ LBBB. Mild MR/TR.   Hyperlipidemia    Hypertension    LBBB (left bundle branch block)      Past Surgical History:  Procedure Laterality Date   endovascular laser N/A      Current Outpatient Medications  Medication Sig Dispense Refill   aspirin EC 81 MG tablet Take 81 mg by mouth daily. Swallow whole.     Cholecalciferol (VITAMIN D3) 25 MCG (1000 UT) CAPS Take 1,000 Units by mouth daily.     cyanocobalamin (,VITAMIN B-12,) 1000 MCG/ML injection Inject into the muscle.     diclofenac Sodium (VOLTAREN) 1 % GEL Apply 2 g topically in the morning, at noon, in the evening, and at bedtime.     etodolac (LODINE) 400 MG tablet Take 400 mg by mouth daily.     furosemide (LASIX) 20 MG tablet Take by mouth daily.     gabapentin (NEURONTIN) 300 MG capsule Take 300 mg by mouth 3 (three) times daily.      glimepiride (AMARYL) 4 MG tablet Take 2 mg by mouth daily with breakfast.     isosorbide mononitrate (IMDUR) 30 MG 24 hr tablet Take 1 tablet (30 mg total) by mouth daily. 90 tablet 2   metFORMIN (GLUCOPHAGE-XR) 500 MG 24 hr tablet Take 1,000 mg by mouth every evening.     metoprolol succinate (TOPROL-XL) 50 MG 24 hr tablet Take 25 mg by mouth 2 (two) times daily.  0   nitroGLYCERIN (NITROSTAT) 0.4 MG SL tablet Place 1 tablet (0.4 mg total) under the tongue every 5 (five) minutes as needed  for chest pain (no more than 3 tablets in a 15 minute period). 30 tablet 3   olmesartan (BENICAR) 40 MG tablet Take 40 mg by mouth daily.     omeprazole (PRILOSEC) 20 MG capsule Take 20 mg by mouth daily.     oxyCODONE (OXY IR/ROXICODONE) 5 MG immediate release tablet Take 5 mg by mouth every 8 (eight) hours as needed.     rosuvastatin (CRESTOR) 10 MG tablet Take 10 mg by mouth daily.     triamcinolone cream (KENALOG) 0.1 % Apply 1 application topically 2 (two) times daily.     OZEMPIC, 0.25 OR 0.5 MG/DOSE, 2 MG/3ML SOPN as directed.     No current facility-administered medications for this visit.    Allergies:   Patient has no known allergies.    Social History:  The patient  reports that she  quit smoking about 3 years ago. She has a 20.00 pack-year smoking history. She has never used smokeless tobacco. She reports that she does not drink alcohol and does not use drugs.   Family History:  The patient's family history includes Breast cancer in her maternal aunt; Celiac disease in her brother; Dementia in her mother; Heart attack in her father; Liver cancer in her sister.    ROS:  Please see the history of present illness.   Otherwise, review of systems are positive for none.   All other systems are reviewed and negative.    PHYSICAL EXAM: VS:  BP 120/78 (BP Location: Left Arm, Patient Position: Sitting, Cuff Size: Normal)   Pulse 73   Ht 5\' 10"  (1.778 m)   Wt 185 lb 4 oz (84 kg)   SpO2 97%   BMI 26.58 kg/m  , BMI Body mass index is 26.58 kg/m. GEN: Well nourished, well developed, in no acute distress  HEENT: normal  Neck: no JVD, carotid bruits, or masses Cardiac: RRR; no murmurs, rubs, or gallops,no edema  Respiratory:  clear to auscultation bilaterally, normal work of breathing GI: soft, nontender, nondistended, + BS MS: no deformity or atrophy  Skin: warm and dry, no rash Neuro:  Strength and sensation are intact Psych: euthymic mood, full affect Radial pulse is normal.  EKG:  EKG is ordered today. EKG showed normal sinus rhythm with left bundle branch block.   Recent Labs: 03/19/2022: BUN 19; Creatinine, Ser 0.83; Hemoglobin 12.1; Platelets 194; Potassium 4.0; Sodium 143    Lipid Panel    Component Value Date/Time   CHOL 122 09/14/2020 1738   TRIG 151 (H) 09/14/2020 1738   HDL 49 09/14/2020 1738   CHOLHDL 2.5 09/14/2020 1738   VLDL 30 09/14/2020 1738   LDLCALC 43 09/14/2020 1738      Wt Readings from Last 3 Encounters:  03/19/22 185 lb 4 oz (84 kg)  09/11/21 182 lb 6 oz (82.7 kg)  12/28/20 185 lb (83.9 kg)          No data to display            ASSESSMENT AND PLAN:  1.  Unstable angina: The patient had stable class II angina for many  years but recently started having episodes at rest requiring nitroglycerin.  She had one prolonged episode on Sunday and another episode that woke her up from sleep.  This is very concerning.  I recommend urgent evaluation with left heart catheterization possible PCI.  I discussed the procedure in details as well as risk and benefits.  Continue same cardiac medications for now.  2.  Essential hypertension: Blood pressure is well controlled on current medications.  3.  Hyperlipidemia: Continue treatment with rosuvastatin.  Most recent lipid profile showed an LDL of 43.  4.  Left bundle branch block: No significant change from before.  5.  Peripheral neuropathy: She has a lot of leg symptoms.  Distal pulses are normal and I do not think she has peripheral arterial disease.    Disposition:   Proceed with urgent left heart catheterization.  Signed,  Marcia Sacramento, MD  03/20/2022 1:38 PM    [place]

## 2022-03-22 ENCOUNTER — Other Ambulatory Visit: Payer: Self-pay

## 2022-03-22 ENCOUNTER — Encounter
Admission: RE | Disposition: A | Discharge status: Home / Self Care | Payer: Self-pay | Source: Home / Self Care | Attending: Cardiovascular Disease

## 2022-03-22 ENCOUNTER — Ambulatory Visit
Admission: RE | Admit: 2022-03-22 | Discharge: 2022-03-22 | Disposition: A | Discharge status: Home / Self Care | Payer: Medicare HMO | Attending: Cardiovascular Disease | Admitting: Cardiovascular Disease

## 2022-03-22 ENCOUNTER — Encounter: Payer: Self-pay | Admitting: Cardiovascular Disease

## 2022-03-22 DIAGNOSIS — I25110 Atherosclerotic heart disease of native coronary artery with unstable angina pectoris: Secondary | ICD-10-CM

## 2022-03-22 DIAGNOSIS — E119 Type 2 diabetes mellitus without complications: Secondary | ICD-10-CM | POA: Insufficient documentation

## 2022-03-22 DIAGNOSIS — Z7985 Long-term (current) use of injectable non-insulin antidiabetic drugs: Secondary | ICD-10-CM | POA: Diagnosis not present

## 2022-03-22 DIAGNOSIS — E785 Hyperlipidemia, unspecified: Secondary | ICD-10-CM | POA: Insufficient documentation

## 2022-03-22 DIAGNOSIS — Z7984 Long term (current) use of oral hypoglycemic drugs: Secondary | ICD-10-CM | POA: Insufficient documentation

## 2022-03-22 DIAGNOSIS — Z87891 Personal history of nicotine dependence: Secondary | ICD-10-CM | POA: Insufficient documentation

## 2022-03-22 DIAGNOSIS — I447 Left bundle-branch block, unspecified: Secondary | ICD-10-CM | POA: Diagnosis not present

## 2022-03-22 DIAGNOSIS — I10 Essential (primary) hypertension: Secondary | ICD-10-CM | POA: Diagnosis not present

## 2022-03-22 DIAGNOSIS — I200 Unstable angina: Secondary | ICD-10-CM

## 2022-03-22 HISTORY — PX: LEFT HEART CATH AND CORONARY ANGIOGRAPHY: CATH118249

## 2022-03-22 LAB — GLUCOSE, CAPILLARY
Glucose-Capillary: 154 mg/dL — ABNORMAL HIGH (ref 70–99)
Glucose-Capillary: 137 mg/dL — ABNORMAL HIGH (ref 70–99)

## 2022-03-22 SURGERY — LEFT HEART CATH AND CORONARY ANGIOGRAPHY
Anesthesia: Moderate Sedation

## 2022-03-22 MED ORDER — ONDANSETRON HCL 4 MG/2ML IJ SOLN: 4.0000 mg | Freq: Four times a day (QID) | INTRAMUSCULAR | Status: DC | PRN

## 2022-03-22 MED ORDER — FENTANYL CITRATE (PF) 100 MCG/2ML IJ SOLN
INTRAMUSCULAR | Status: DC | PRN
  Administered 2022-03-22: 25 ug via INTRAVENOUS

## 2022-03-22 MED ORDER — HEPARIN (PORCINE) IN NACL 1000-0.9 UT/500ML-% IV SOLN
INTRAVENOUS | Status: DC | PRN
  Administered 2022-03-22: 1000 mL

## 2022-03-22 MED ORDER — VERAPAMIL HCL 2.5 MG/ML IV SOLN
INTRAVENOUS | Status: AC
  Filled 2022-03-22: qty 2

## 2022-03-22 MED ORDER — SODIUM CHLORIDE 0.9 % IV SOLN
250.0000 mL | INTRAVENOUS | Status: DC | PRN
Start: 2022-03-22 — End: 2022-03-22

## 2022-03-22 MED ORDER — IOHEXOL 300 MG/ML  SOLN
INTRAMUSCULAR | Status: DC | PRN
  Administered 2022-03-22: 78 mL

## 2022-03-22 MED ORDER — SODIUM CHLORIDE 0.9 % IV SOLN: INTRAVENOUS | Status: DC

## 2022-03-22 MED ORDER — SODIUM CHLORIDE 0.9 % IV SOLN: 250.0000 mL | INTRAVENOUS | Status: DC | PRN

## 2022-03-22 MED ORDER — LABETALOL HCL 5 MG/ML IV SOLN
INTRAVENOUS | Status: AC
  Filled 2022-03-22: qty 4

## 2022-03-22 MED ORDER — HEPARIN SODIUM (PORCINE) 1000 UNIT/ML IJ SOLN
INTRAMUSCULAR | Status: AC
  Filled 2022-03-22: qty 10

## 2022-03-22 MED ORDER — SODIUM CHLORIDE 0.9% FLUSH: 3.0000 mL | Freq: Two times a day (BID) | INTRAVENOUS | Status: DC

## 2022-03-22 MED ORDER — LIDOCAINE HCL 1 % IJ SOLN
INTRAMUSCULAR | Status: AC
  Filled 2022-03-22: qty 20

## 2022-03-22 MED ORDER — LIDOCAINE HCL (PF) 1 % IJ SOLN
INTRAMUSCULAR | Status: DC | PRN
  Administered 2022-03-22: 2 mL

## 2022-03-22 MED ORDER — VERAPAMIL HCL 2.5 MG/ML IV SOLN
INTRAVENOUS | Status: DC | PRN
  Administered 2022-03-22: 2.5 mg via INTRA_ARTERIAL

## 2022-03-22 MED ORDER — HYDRALAZINE HCL 20 MG/ML IJ SOLN
INTRAMUSCULAR | Status: AC
  Filled 2022-03-22: qty 1

## 2022-03-22 MED ORDER — LABETALOL HCL 5 MG/ML IV SOLN
INTRAVENOUS | Status: DC | PRN
  Administered 2022-03-22: 10 mg via INTRAVENOUS

## 2022-03-22 MED ORDER — METOPROLOL TARTRATE 5 MG/5ML IV SOLN: INTRAVENOUS | Status: DC | PRN

## 2022-03-22 MED ORDER — HEPARIN SODIUM (PORCINE) 1000 UNIT/ML IJ SOLN
INTRAMUSCULAR | Status: DC | PRN
  Administered 2022-03-22: 4000 [IU] via INTRAVENOUS

## 2022-03-22 MED ORDER — ACETAMINOPHEN 325 MG PO TABS: 650.0000 mg | ORAL_TABLET | ORAL | Status: DC | PRN

## 2022-03-22 MED ORDER — HEPARIN (PORCINE) IN NACL 1000-0.9 UT/500ML-% IV SOLN
INTRAVENOUS | Status: AC
  Filled 2022-03-22: qty 1000

## 2022-03-22 MED ORDER — MIDAZOLAM HCL 2 MG/2ML IJ SOLN
INTRAMUSCULAR | Status: AC
  Filled 2022-03-22: qty 2

## 2022-03-22 MED ORDER — SODIUM CHLORIDE 0.9% FLUSH: 3.0000 mL | INTRAVENOUS | Status: DC | PRN

## 2022-03-22 MED ORDER — ASPIRIN 81 MG PO CHEW: 81.0000 mg | CHEWABLE_TABLET | ORAL | Status: DC

## 2022-03-22 MED ORDER — MIDAZOLAM HCL 2 MG/2ML IJ SOLN
INTRAMUSCULAR | Status: DC | PRN
  Administered 2022-03-22: 1 mg via INTRAVENOUS

## 2022-03-22 MED ORDER — HYDRALAZINE HCL 20 MG/ML IJ SOLN
INTRAMUSCULAR | Status: DC | PRN
  Administered 2022-03-22: 10 mg via INTRAVENOUS

## 2022-03-22 MED ORDER — FENTANYL CITRATE (PF) 100 MCG/2ML IJ SOLN
INTRAMUSCULAR | Status: AC
  Filled 2022-03-22: qty 2

## 2022-03-22 SURGICAL SUPPLY — 13 items
BAND ZEPHYR COMPRESS 30 LONG (HEMOSTASIS) ×1 IMPLANT
CATH 5F 110X4 TIG (CATHETERS) ×1 IMPLANT
CATH 5FR JR4 DIAGNOSTIC (CATHETERS) ×1 IMPLANT
CATH INFINITI 5 FR JL3.5 (CATHETERS) ×1 IMPLANT
DRAPE BRACHIAL (DRAPES) ×1 IMPLANT
GLIDESHEATH SLEND SS 6F .021 (SHEATH) ×1 IMPLANT
GUIDEWIRE INQWIRE 1.5J.035X260 (WIRE) IMPLANT
INQWIRE 1.5J .035X260CM (WIRE) ×2
PACK CARDIAC CATH (CUSTOM PROCEDURE TRAY) ×2 IMPLANT
PROTECTION STATION PRESSURIZED (MISCELLANEOUS) ×2
SET ATX SIMPLICITY (MISCELLANEOUS) ×1 IMPLANT
STATION PROTECTION PRESSURIZED (MISCELLANEOUS) IMPLANT
WIRE HITORQ VERSACORE ST 145CM (WIRE) ×1 IMPLANT

## 2022-03-22 NOTE — Interval H&P Note (Signed)
History and Physical Interval Note:  03/22/2022 9:37 AM  [name]  has presented today for surgery, with the diagnosis of LT Heart Cath   Unstable Angina.  The various methods of treatment have been discussed with the patient and family. After consideration of risks, benefits and other options for treatment, the patient has consented to  Procedure(s): LEFT HEART CATH AND CORONARY ANGIOGRAPHY (N/A) as a surgical intervention.  The patient's history has been reviewed, patient examined, no change in status, stable for surgery.  I have reviewed the patient's chart and labs.  Questions were answered to the patient's satisfaction.     Lorine Bears

## 2022-03-25 ENCOUNTER — Encounter: Payer: Self-pay | Admitting: Cardiovascular Disease

## 2022-04-16 ENCOUNTER — Ambulatory Visit: Payer: Medicare HMO | Admitting: Physician Assistant

## 2022-04-29 ENCOUNTER — Ambulatory Visit: Payer: Medicare HMO | Admitting: Physician Assistant

## 2022-04-29 ENCOUNTER — Encounter: Payer: Self-pay | Admitting: Physician Assistant

## 2022-04-29 VITALS — BP 136/64 | HR 72 | Ht 70.0 in | Wt 183.4 lb

## 2022-04-29 DIAGNOSIS — E785 Hyperlipidemia, unspecified: Secondary | ICD-10-CM | POA: Diagnosis not present

## 2022-04-29 DIAGNOSIS — I447 Left bundle-branch block, unspecified: Secondary | ICD-10-CM

## 2022-04-29 DIAGNOSIS — I2510 Atherosclerotic heart disease of native coronary artery without angina pectoris: Secondary | ICD-10-CM | POA: Diagnosis not present

## 2022-04-29 DIAGNOSIS — I10 Essential (primary) hypertension: Secondary | ICD-10-CM

## 2022-04-29 DIAGNOSIS — R0609 Other forms of dyspnea: Secondary | ICD-10-CM

## 2022-04-29 MED ORDER — ISOSORBIDE MONONITRATE ER 60 MG PO TB24: 60.0000 mg | ORAL_TABLET | Freq: Every day | ORAL | 3 refills | Status: DC

## 2022-04-29 NOTE — Progress Notes (Signed)
Cardiology Office Note    Date:  04/29/2022   ID:  [name], DOB 26-Sep-1946, MRN [id number]  PCP:  Danella Penton, MD  Cardiologist:  Lorine Bears, MD  Electrophysiologist:  None   Chief Complaint: Follow-up  History of Present Illness:   [name] is a 76 y.o. female with history of nonobstructive CAD by LHC in 03/2022 with chronic angina, LBBB, DM2, HTN, and HLD who presents for follow-up of LHC.  Prior LHC in 2008 showed minor irregularities.  She was admitted to the hospital in 09/2020 with atypical chest pain.  Troponin was normal.  Lexiscan MPI in 09/2020 showed no significant ischemia with an EF of 62%.  CT attenuated corrected images showed coronary artery calcification in the LAD and very mild aortic atherosclerosis.  Overall, this was a low risk scan.  Echo in 09/2020 demonstrated an EF of 55%, wall motion abnormality felt to be secondary bundle branch block, grade 1 diastolic dysfunction, mild LVH, normal RV systolic function and ventricular cavity size, and mild mitral regurgitation.  She was last seen in 03/2022 and noted a 2 to 69-month history of change in her longstanding angina with noted exertional dyspnea, chest pain, and extreme fatigue.  Chest pain was nitro responsive and would improve with rest.  Given concerning symptoms, she underwent LHC on 03/22/2022 showed mild nonobstructive CAD with 20% ostial to proximal LCx stenosis, 20% proximal RCA stenosis, and 30% mid RCA stenosis.  LVEF 55 to 65%.  Normal LVEDP.  Medical therapy was recommended.  She comes in doing well from a cardiac perspective and is without symptoms of angina or decompensation.  She notes some improvement in her chest pain symptoms since undergoing LHC.  No dizziness, presyncope, or syncope.  No significant lower extremity swelling or orthopnea.  She is tolerating cardiac medications without issues.  She did note some bruising along her right forearm following cardiac cath, this has resolved.  She  does not have any active cardiac issues or concerns at this time.   Labs independently reviewed: 03/2022 - potassium 4.0, BUN 19, serum creatinine 0.83, Hgb 12.1, PLT 194 11/2021 - albumin 4.4, AST/ALT normal, A1c 9.2, TC 142, TG 277, HDL 43, LDL 43, TSH normal  Past Medical History:  Diagnosis Date   Chest pain    a. reports multiple nl stress tests and nl cath in 2008.   Diabetes mellitus without complication (HCC)    History of echocardiogram    a. 07/2013 Echo: EF 50-55%. No rwma. Gr1 DD. Abnl septal motion consistent w/ LBBB. Mild MR/TR.   Hyperlipidemia    Hypertension    LBBB (left bundle branch block)     Past Surgical History:  Procedure Laterality Date   endovascular laser N/A    LEFT HEART CATH AND CORONARY ANGIOGRAPHY N/A 03/22/2022   Procedure: LEFT HEART CATH AND CORONARY ANGIOGRAPHY;  Surgeon: Iran Ouch, MD;  Location: [facility] INVASIVE CV LAB;  Service: Cardiovascular;  Laterality: N/A;    Current Medications: Current Meds  Medication Sig   aspirin EC 81 MG tablet Take 81 mg by mouth daily. Swallow whole.   Cholecalciferol (VITAMIN D3) 25 MCG (1000 UT) CAPS Take 1,000 Units by mouth daily.   cyanocobalamin (,VITAMIN B-12,) 1000 MCG/ML injection Inject into the muscle.   diclofenac Sodium (VOLTAREN) 1 % GEL Apply 2 g topically in the morning, at noon, in the evening, and at bedtime.   etodolac (LODINE) 400 MG tablet Take 400 mg by mouth daily.  furosemide (LASIX) 20 MG tablet Take by mouth daily.   gabapentin (NEURONTIN) 300 MG capsule Take 300 mg by mouth 3 (three) times daily.    glimepiride (AMARYL) 4 MG tablet Take 2 mg by mouth daily with breakfast.   isosorbide mononitrate (IMDUR) 60 MG 24 hr tablet Take 1 tablet (60 mg total) by mouth daily.   metFORMIN (GLUCOPHAGE-XR) 500 MG 24 hr tablet Take 1,000 mg by mouth every evening.   metoprolol succinate (TOPROL-XL) 50 MG 24 hr tablet Take 25 mg by mouth 2 (two) times daily.   nitroGLYCERIN (NITROSTAT) 0.4 MG  SL tablet Place 1 tablet (0.4 mg total) under the tongue every 5 (five) minutes as needed for chest pain (no more than 3 tablets in a 15 minute period).   olmesartan (BENICAR) 40 MG tablet Take 40 mg by mouth daily.   omeprazole (PRILOSEC) 20 MG capsule Take 20 mg by mouth daily.   oxyCODONE (OXY IR/ROXICODONE) 5 MG immediate release tablet Take 5 mg by mouth every 8 (eight) hours as needed.   OZEMPIC, 0.25 OR 0.5 MG/DOSE, 2 MG/3ML SOPN as directed.   rosuvastatin (CRESTOR) 10 MG tablet Take 10 mg by mouth daily.   triamcinolone cream (KENALOG) 0.1 % Apply 1 application topically 2 (two) times daily.   [DISCONTINUED] isosorbide mononitrate (IMDUR) 30 MG 24 hr tablet Take 1 tablet (30 mg total) by mouth daily.    Allergies:   Patient has no known allergies.   Social History   Socioeconomic History   Marital status: Married    Spouse name: Not on file   Number of children: Not on file   Years of education: Not on file   Highest education level: Not on file  Occupational History   Not on file  Tobacco Use   Smoking status: Former    Packs/day: 0.50    Years: 40.00    Total pack years: 20.00    Types: Cigarettes    Quit date: 10/07/2018    Years since quitting: 3.5   Smokeless tobacco: Never  Vaping Use   Vaping Use: Never used  Substance and Sexual Activity   Alcohol use: No   Drug use: No   Sexual activity: Not on file  Other Topics Concern   Not on file  Social History Narrative   Lives outside of [place] w/ husband.  Walks 1/3 mile/day.   Social Determinants of Health   Financial Resource Strain: Not on file  Food Insecurity: Not on file  Transportation Needs: Not on file  Physical Activity: Not on file  Stress: Not on file  Social Connections: Not on file     Family History:  The patient's family history includes Breast cancer in her maternal aunt; Celiac disease in her brother; Dementia in her mother; Heart attack in her father; Liver cancer in her sister.  ROS:    12-point review of systems is negative unless otherwise noted in the HPI.   EKGs/Labs/Other Studies Reviewed:    Studies reviewed were summarized above. The additional studies were reviewed today:  LHC 03/22/2022:   Prox RCA lesion is 20% stenosed.   Mid RCA lesion is 30% stenosed.   Ost Cx to Prox Cx lesion is 20% stenosed.   The left ventricular systolic function is normal.   LV end diastolic pressure is normal.   The left ventricular ejection fraction is 55-65% by visual estimate.   1.  Mild nonobstructive coronary artery disease. 2.  Normal LV systolic function and normal left  ventricular end-diastolic pressure.   Recommendations: No culprit is identified for unstable angina.  Continue medical therapy. __________  Carlton Adam MPI 09/15/2020: Pharmacological myocardial perfusion imaging study with no significant  Ischemia Small mild defect in the apical region, likely attenuation artifact  Normal wall motion, EF estimated at 62% No EKG changes concerning for ischemia at peak stress or in recovery. CT attenuation correction images with coronary calcification in the LAD, very mild in the aorta Low risk scan __________  2D echo 09/15/2020: 1. Left ventricular ejection fraction, by estimation, is 55%. The left  ventricle has normal function. Anteroseptal wall motion abnormality,  likely secondary to conduction abnormailty, bundle branch block. There is  mild left ventricular hypertrophy. Left   ventricular diastolic parameters are consistent with Grade I diastolic  dysfunction (impaired relaxation).   2. Right ventricular systolic function is normal. The right ventricular  size is normal.   3. The mitral valve is normal in structure. Mild mitral valve  regurgitation.   4. Challenging images, definity used __________  2D echo 07/27/2013: - Left ventricle: The cavity size was normal. There was mild    concentric hypertrophy. Systolic function was normal. The    estimated  ejection fraction was in the range of 50% to    55%. Wall motion was normal; there were no regional wall    motion abnormalities. Doppler parameters are consistent    with abnormal left ventricular relaxation (grade 1    diastolic dysfunction).  - Ventricular septum: Septal motion showed abnormal    function. These changes are consistent with a left bundle    branch block.  - Mitral valve: Mild regurgitation.  - Tricuspid valve: Mild regurgitation.  - Pulmonary arteries: Systolic pressure was within the    normal range.    EKG:  EKG is ordered today.  The EKG ordered today demonstrates NSR, 72 bpm, LBBB  Recent Labs: 03/19/2022: BUN 19; Creatinine, Ser 0.83; Hemoglobin 12.1; Platelets 194; Potassium 4.0; Sodium 143  Recent Lipid Panel    Component Value Date/Time   CHOL 122 09/14/2020 1738   TRIG 151 (H) 09/14/2020 1738   HDL 49 09/14/2020 1738   CHOLHDL 2.5 09/14/2020 1738   VLDL 30 09/14/2020 1738   LDLCALC 43 09/14/2020 1738    PHYSICAL EXAM:    VS:  BP 136/64 (BP Location: Left Arm, Patient Position: Sitting, Cuff Size: Normal)   Pulse 72   Ht 5\' 10"  (1.778 m)   Wt 183 lb 6 oz (83.2 kg)   SpO2 97%   BMI 26.31 kg/m   BMI: Body mass index is 26.31 kg/m.  Physical Exam Vitals reviewed.  Constitutional:      Appearance: She is well-developed.  HENT:     Head: Normocephalic and atraumatic.  Eyes:     General:        Right eye: No discharge.        Left eye: No discharge.  Cardiovascular:     Rate and Rhythm: Normal rate and regular rhythm.     Pulses:          Posterior tibial pulses are 2+ on the right side and 2+ on the left side.     Heart sounds: Normal heart sounds, S1 normal and S2 normal. Heart sounds not distant. No midsystolic click and no opening snap. No murmur heard.    No friction rub.     Comments: Right radial arteriotomy site has healed well without swelling, warmth, erythema, bleeding, current bruising, or tenderness to  palpation.  Radial pulse  is 2+ proximal and distal to the arteriotomy site.  3 months Pulmonary:     Effort: Pulmonary effort is normal. No respiratory distress.     Breath sounds: Normal breath sounds. No decreased breath sounds, wheezing or rales.  Chest:     Chest wall: No tenderness.  Abdominal:     General: There is no distension.  Musculoskeletal:     Cervical back: Normal range of motion.     Right lower leg: No edema.     Left lower leg: No edema.  Skin:    General: Skin is warm and dry.     Nails: There is no clubbing.  Neurological:     Mental Status: She is alert and oriented to person, place, and time.  Psychiatric:        Speech: Speech normal.        Behavior: Behavior normal.        Thought Content: Thought content normal.        Judgment: Judgment normal.     Wt Readings from Last 3 Encounters:  04/29/22 183 lb 6 oz (83.2 kg)  03/22/22 185 lb (83.9 kg)  03/19/22 185 lb 4 oz (84 kg)     ASSESSMENT & PLAN:   Nonobstructive CAD with stable angina/exertional dyspnea: Symptoms of angina have improved following LHC.  Titrate Imdur to 60 mg daily.  Obtain echo.  Otherwise, continue aggressive risk factor modification including aspirin, metoprolol, rosuvastatin, as needed SL NTG.  No arteriotomy site complications at this time.  HTN: Blood pressure is reasonably controlled on current medications.  No changes.  HLD: LDL 43.  She remains on rosuvastatin 10 mg.  LBBB: Stable.  No presyncope or syncope.  Peripheral neuropathy: Distal pedal pulses normal.  Unlikely PAD.  Follow-up with PCP.   Disposition: F/u with Dr. Fletcher Anon or an APP in 3 months.   Medication Adjustments/Labs and Tests Ordered: Current medicines are reviewed at length with the patient today.  Concerns regarding medicines are outlined above. Medication changes, Labs and Tests ordered today are summarized above and listed in the Patient Instructions accessible in Encounters.   Signed, Christell Faith, PA-C 04/29/2022 12:14 PM      [facility] [address], [place] [address] [phone]

## 2022-04-29 NOTE — Patient Instructions (Signed)
Medication Instructions:  Your physician has recommended you make the following change in your medication:   INCREASE Isosorbide mononitrate to 60 mg once daily   *If you need a refill on your cardiac medications before your next appointment, please call your pharmacy*   Lab Work: None  If you have labs (blood work) drawn today and your tests are completely normal, you will receive your results only by: MyChart Message (if you have MyChart) OR A paper copy in the mail If you have any lab test that is abnormal or we need to change your treatment, we will call you to review the results.   Testing/Procedures: Your physician has requested that you have an echocardiogram. Echocardiography is a painless test that uses sound waves to create images of your heart. It provides your doctor with information about the size and shape of your heart and how well your heart's chambers and valves are working. This procedure takes approximately one hour. There are no restrictions for this procedure.    Follow-Up: At [facility], you and your health needs are our priority.  As part of our continuing mission to provide you with exceptional heart care, we have created designated Provider Care Teams.  These Care Teams include your primary Cardiologist (physician) and Advanced Practice Providers (APPs -  Physician Assistants and Nurse Practitioners) who all work together to provide you with the care you need, when you need it.  Your next appointment:   3 month(s)  The format for your next appointment:   In Person  Provider:   Lorine Bears, MD or Eula Listen, PA-C      Important Information About Sugar

## 2022-05-01 ENCOUNTER — Ambulatory Visit (INDEPENDENT_AMBULATORY_CARE_PROVIDER_SITE_OTHER): Payer: Medicare HMO

## 2022-05-01 DIAGNOSIS — E08621 Diabetes mellitus due to underlying condition with foot ulcer: Secondary | ICD-10-CM | POA: Diagnosis not present

## 2022-05-01 DIAGNOSIS — G8929 Other chronic pain: Secondary | ICD-10-CM | POA: Diagnosis not present

## 2022-05-01 DIAGNOSIS — R0609 Other forms of dyspnea: Secondary | ICD-10-CM | POA: Diagnosis not present

## 2022-05-01 DIAGNOSIS — Z79891 Long term (current) use of opiate analgesic: Secondary | ICD-10-CM | POA: Diagnosis not present

## 2022-05-01 DIAGNOSIS — G894 Chronic pain syndrome: Secondary | ICD-10-CM | POA: Diagnosis not present

## 2022-05-01 DIAGNOSIS — F1120 Opioid dependence, uncomplicated: Secondary | ICD-10-CM | POA: Diagnosis not present

## 2022-05-01 DIAGNOSIS — I10 Essential (primary) hypertension: Secondary | ICD-10-CM | POA: Diagnosis not present

## 2022-05-01 DIAGNOSIS — M79671 Pain in right foot: Secondary | ICD-10-CM | POA: Diagnosis not present

## 2022-05-01 DIAGNOSIS — E0842 Diabetes mellitus due to underlying condition with diabetic polyneuropathy: Secondary | ICD-10-CM | POA: Diagnosis not present

## 2022-05-01 LAB — ECHOCARDIOGRAM COMPLETE
AR max vel: 2.54 cm2
AV Area VTI: 2.57 cm2
AV Area mean vel: 2.25 cm2
AV Mean grad: 7 mmHg
AV Peak grad: 12.1 mmHg
Ao pk vel: 1.74 m/s
Area-P 1/2: 2.52 cm2
S' Lateral: 2.2 cm

## 2022-05-02 ENCOUNTER — Telehealth: Payer: Self-pay | Admitting: *Deleted

## 2022-05-02 NOTE — Telephone Encounter (Signed)
-----   Message from Sondra Barges, PA-C sent at 05/02/2022  7:35 AM EDT ----- Echo showed normal pump function, moderate thickening of the heart, slight stiffening of the heart, normal pump function of the right side of the heart, trivially leaky mitral valve, and normal pressure within the upper right chamber of the heart.  Findings are overall largely stable with recommendation to continue current medical therapy and optimal blood pressure control.

## 2022-05-02 NOTE — Telephone Encounter (Signed)
Line was busy. No voicemail.

## 2022-05-09 NOTE — Telephone Encounter (Signed)
Follow Up:    Patient is returning call, concerning results.

## 2022-05-09 NOTE — Telephone Encounter (Signed)
Left voicemail message to call back for review of results.  

## 2022-05-09 NOTE — Telephone Encounter (Signed)
Reviewed results with patient and she verbalized understanding with no further questions at this time.  

## 2022-05-10 DIAGNOSIS — Z79899 Other long term (current) drug therapy: Secondary | ICD-10-CM | POA: Diagnosis not present

## 2022-05-21 DIAGNOSIS — E1140 Type 2 diabetes mellitus with diabetic neuropathy, unspecified: Secondary | ICD-10-CM | POA: Diagnosis not present

## 2022-05-27 ENCOUNTER — Ambulatory Visit: Payer: Medicare HMO | Admitting: Physician Assistant

## 2022-05-28 DIAGNOSIS — E119 Type 2 diabetes mellitus without complications: Secondary | ICD-10-CM | POA: Diagnosis not present

## 2022-05-28 DIAGNOSIS — Z0000 Encounter for general adult medical examination without abnormal findings: Secondary | ICD-10-CM | POA: Diagnosis not present

## 2022-07-01 DIAGNOSIS — G894 Chronic pain syndrome: Secondary | ICD-10-CM | POA: Diagnosis not present

## 2022-07-01 DIAGNOSIS — Z79891 Long term (current) use of opiate analgesic: Secondary | ICD-10-CM | POA: Diagnosis not present

## 2022-07-01 DIAGNOSIS — F1120 Opioid dependence, uncomplicated: Secondary | ICD-10-CM | POA: Diagnosis not present

## 2022-07-01 DIAGNOSIS — E08621 Diabetes mellitus due to underlying condition with foot ulcer: Secondary | ICD-10-CM | POA: Diagnosis not present

## 2022-07-01 DIAGNOSIS — E0842 Diabetes mellitus due to underlying condition with diabetic polyneuropathy: Secondary | ICD-10-CM | POA: Diagnosis not present

## 2022-07-01 DIAGNOSIS — M79671 Pain in right foot: Secondary | ICD-10-CM | POA: Diagnosis not present

## 2022-07-01 DIAGNOSIS — I10 Essential (primary) hypertension: Secondary | ICD-10-CM | POA: Diagnosis not present

## 2022-07-01 DIAGNOSIS — G8929 Other chronic pain: Secondary | ICD-10-CM | POA: Diagnosis not present

## 2022-07-30 NOTE — Progress Notes (Unsigned)
Cardiology Office Note    Date:  07/31/2022   ID:  [name], DOB Sep 28, 1946, MRN [id number]  PCP:  Rusty Aus, MD  Cardiologist:  Kathlyn Sacramento, MD  Electrophysiologist:  None   Chief Complaint: Follow-up  History of Present Illness:   [name] is a 76 y.o. female with history of nonobstructive CAD by [place] in 03/2022 with chronic angina, LBBB, DM2, HTN, and HLD who presents for follow-up of echo.   Prior LHC in 2008 showed minor irregularities.  She was admitted to the hospital in 09/2020 with atypical chest pain.  Troponin was normal.  Lexiscan MPI in 09/2020 showed no significant ischemia with an EF of 62%.  CT attenuated corrected images showed coronary artery calcification in the LAD and very mild aortic atherosclerosis.  Overall, this was a low risk scan.  Echo in 09/2020 demonstrated an EF of 55%, wall motion abnormality felt to be secondary bundle branch block, grade 1 diastolic dysfunction, mild LVH, normal RV systolic function and ventricular cavity size, and mild mitral regurgitation.  She was seen in 03/2022 and noted a 2 to 44-month history of change in her longstanding angina with noted exertional dyspnea, chest pain, and extreme fatigue.  Chest pain was nitro responsive and would improve with rest.  Given concerning symptoms, she underwent LHC on 03/22/2022 showed mild nonobstructive CAD with 20% ostial to proximal LCx stenosis, 20% proximal RCA stenosis, and 30% mid RCA stenosis.  LVEF 55 to 65%.  Normal LVEDP.  Medical therapy was recommended.  She was last seen in the office in 04/2022, and was without symptoms of angina or decompensation.  She noted improvement in her chest pain symptoms following LHC.  Echo on 05/01/2022 demonstrated an EF of 65 to 70%, moderate LVH, grade 1 diastolic dysfunction, turbulent flow in the LVOT suggestive of possible LVOT obstruction, though significant gradient was not demonstrated by pulsed wave Doppler, normal RV systolic function and  ventricular cavity size, trivial mitral regurgitation, and an estimated right atrial pressure of 3 mmHg.  She comes in today and continues to do well from a cardiac perspective.  She was without symptoms of angina or decompensation.  She has not had any further chest discomfort or dyspnea.  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  Her main limiting factor at this time is back pain and peripheral neuropathy.  No dizziness, presyncope, or syncope.  No falls.   Labs independently reviewed: 05/2022 - Hgb 12.5, PLT 215, potassium 4.3, BUN 23, serum creatinine 0.9, albumin 4.4, AST/ALT normal, A1c 8.0, TC 135, TG 319, HDL 35, LDL 36, TSH normal  Past Medical History:  Diagnosis Date   Chest pain    a. reports multiple nl stress tests and nl cath in 2008.   Diabetes mellitus without complication ([place])    History of echocardiogram    a. 07/2013 Echo: EF 50-55%. No rwma. Gr1 DD. Abnl septal motion consistent w/ LBBB. Mild MR/TR.   Hyperlipidemia    Hypertension    LBBB (left bundle branch block)     Past Surgical History:  Procedure Laterality Date   endovascular laser N/A    LEFT HEART CATH AND CORONARY ANGIOGRAPHY N/A 03/22/2022   Procedure: LEFT HEART CATH AND CORONARY ANGIOGRAPHY;  Surgeon: Wellington Hampshire, MD;  Location: [facility];  Service: Cardiovascular;  Laterality: N/A;    Current Medications: Current Meds  Medication Sig   aspirin EC 81 MG tablet Take 81  mg by mouth daily. Swallow whole.   Cholecalciferol (VITAMIN D3) 25 MCG (1000 UT) CAPS Take 1,000 Units by mouth daily.   cyanocobalamin (,VITAMIN B-12,) 1000 MCG/ML injection Inject into the muscle.   diclofenac Sodium (VOLTAREN) 1 % GEL Apply 2 g topically in the morning, at noon, in the evening, and at bedtime.   etodolac (LODINE) 400 MG tablet Take 400 mg by mouth daily.   furosemide (LASIX) 20 MG tablet Take by mouth daily.   gabapentin (NEURONTIN) 300 MG capsule Take 300 mg by mouth 3  (three) times daily.    glimepiride (AMARYL) 4 MG tablet Take 2 mg by mouth daily with breakfast.   isosorbide mononitrate (IMDUR) 60 MG 24 hr tablet Take 1 tablet (60 mg total) by mouth daily.   metFORMIN (GLUCOPHAGE-XR) 500 MG 24 hr tablet Take 1,000 mg by mouth every evening.   metoprolol succinate (TOPROL-XL) 50 MG 24 hr tablet Take 25 mg by mouth 2 (two) times daily.   olmesartan (BENICAR) 40 MG tablet Take 40 mg by mouth daily.   omeprazole (PRILOSEC) 20 MG capsule Take 20 mg by mouth daily.   oxyCODONE (OXY IR/ROXICODONE) 5 MG immediate release tablet Take 5 mg by mouth every 8 (eight) hours as needed.   OZEMPIC, 0.25 OR 0.5 MG/DOSE, 2 MG/3ML SOPN as directed.   rosuvastatin (CRESTOR) 10 MG tablet Take 10 mg by mouth daily.   sertraline (ZOLOFT) 50 MG tablet Take 50 mg by mouth daily.   triamcinolone cream (KENALOG) 0.1 % Apply 1 application topically 2 (two) times daily.   [DISCONTINUED] nitroGLYCERIN (NITROSTAT) 0.4 MG SL tablet Place 1 tablet (0.4 mg total) under the tongue every 5 (five) minutes as needed for chest pain (no more than 3 tablets in a 15 minute period).    Allergies:   Patient has no known allergies.   Social History   Socioeconomic History   Marital status: Married    Spouse name: Not on file   Number of children: Not on file   Years of education: Not on file   Highest education level: Not on file  Occupational History   Not on file  Tobacco Use   Smoking status: Former    Packs/day: 0.50    Years: 40.00    Total pack years: 20.00    Types: Cigarettes    Quit date: 10/07/2018    Years since quitting: 3.8   Smokeless tobacco: Never  Vaping Use   Vaping Use: Never used  Substance and Sexual Activity   Alcohol use: No   Drug use: No   Sexual activity: Not on file  Other Topics Concern   Not on file  Social History Narrative   Lives outside of [place] w/ husband.  Walks 1/3 mile/day.   Social Determinants of Health   Financial Resource Strain: Not on  file  Food Insecurity: Not on file  Transportation Needs: Not on file  Physical Activity: Not on file  Stress: Not on file  Social Connections: Not on file     Family History:  The patient's family history includes Breast cancer in her maternal aunt; Celiac disease in her brother; Dementia in her mother; Heart attack in her father; Liver cancer in her sister.  ROS:   12-point review of systems was negative unless otherwise noted in the HPI.   EKGs/Labs/Other Studies Reviewed:    Studies reviewed were summarized above. The additional studies were reviewed today:  2D echo 05/01/2022: 1. Left ventricular ejection fraction, by estimation,  is 65 to 70%. The  left ventricle has normal function. Left ventricular endocardial border  not optimally defined to evaluate regional wall motion. There is moderate  left ventricular hypertrophy. Left  ventricular diastolic parameters are consistent with Grade I diastolic  dysfunction (impaired relaxation). There is turbulent flow in the left  ventricular outflow tract suggestive of possible LVOT obstruction though  significant gradient is not  demonstrated by pulsed wave Doppler.   2. Right ventricular systolic function is normal. The right ventricular  size is normal. Tricuspid regurgitation signal is inadequate for assessing  PA pressure.   3. The mitral valve is normal in structure. Trivial mitral valve  regurgitation.   4. The aortic valve is tricuspid. Aortic valve regurgitation is not  visualized. No aortic stenosis is present.   5. The inferior vena cava is normal in size with greater than 50%  respiratory variability, suggesting right atrial pressure of 3 mmHg.   Comparison(s): EF 55%, mild LVH. __________  [facility] 03/22/2022:   Prox RCA lesion is 20% stenosed.   Mid RCA lesion is 30% stenosed.   Ost Cx to Prox Cx lesion is 20% stenosed.   The left ventricular systolic function is normal.   LV end diastolic pressure is normal.   The  left ventricular ejection fraction is 55-65% by visual estimate.   1.  Mild nonobstructive coronary artery disease. 2.  Normal LV systolic function and normal left ventricular end-diastolic pressure.   Recommendations: No culprit is identified for unstable angina.  Continue medical therapy. __________   Eugenie Birks MPI 09/15/2020: Pharmacological myocardial perfusion imaging study with no significant  Ischemia Small mild defect in the apical region, likely attenuation artifact  Normal wall motion, EF estimated at 62% No EKG changes concerning for ischemia at peak stress or in recovery. CT attenuation correction images with coronary calcification in the LAD, very mild in the aorta Low risk scan __________   2D echo 09/15/2020: 1. Left ventricular ejection fraction, by estimation, is 55%. The left  ventricle has normal function. Anteroseptal wall motion abnormality,  likely secondary to conduction abnormailty, bundle branch block. There is  mild left ventricular hypertrophy. Left   ventricular diastolic parameters are consistent with Grade I diastolic  dysfunction (impaired relaxation).   2. Right ventricular systolic function is normal. The right ventricular  size is normal.   3. The mitral valve is normal in structure. Mild mitral valve  regurgitation.   4. Challenging images, definity used __________   2D echo 07/27/2013: - Left ventricle: The cavity size was normal. There was mild    concentric hypertrophy. Systolic function was normal. The    estimated ejection fraction was in the range of 50% to    55%. Wall motion was normal; there were no regional wall    motion abnormalities. Doppler parameters are consistent    with abnormal left ventricular relaxation (grade 1    diastolic dysfunction).  - Ventricular septum: Septal motion showed abnormal    function. These changes are consistent with a left bundle    branch block.  - Mitral valve: Mild regurgitation.  - Tricuspid  valve: Mild regurgitation.  - Pulmonary arteries: Systolic pressure was within the    normal range.   EKG:  EKG is not ordered today.   Recent Labs: 03/19/2022: BUN 19; Creatinine, Ser 0.83; Hemoglobin 12.1; Platelets 194; Potassium 4.0; Sodium 143  Recent Lipid Panel    Component Value Date/Time   CHOL 122 09/14/2020 1738   TRIG 151 (H)  09/14/2020 1738   HDL 49 09/14/2020 1738   CHOLHDL 2.5 09/14/2020 1738   VLDL 30 09/14/2020 1738   LDLCALC 43 09/14/2020 1738    PHYSICAL EXAM:    VS:  BP 120/60 (BP Location: Left Arm, Patient Position: Sitting, Cuff Size: Normal)   Pulse 69   Ht 5\' 10"  (1.778 m)   Wt 178 lb 9 oz (81 kg)   SpO2 97%   BMI 25.62 kg/m   BMI: Body mass index is 25.62 kg/m.  Physical Exam Vitals reviewed.  Constitutional:      Appearance: She is well-developed.  HENT:     Head: Normocephalic and atraumatic.  Eyes:     General:        Right eye: No discharge.        Left eye: No discharge.  Neck:     Vascular: No JVD.  Cardiovascular:     Rate and Rhythm: Normal rate and regular rhythm.     Pulses:          Posterior tibial pulses are 2+ on the right side and 2+ on the left side.     Heart sounds: Normal heart sounds, S1 normal and S2 normal. Heart sounds not distant. No midsystolic click and no opening snap. No murmur heard.    No friction rub.  Pulmonary:     Effort: Pulmonary effort is normal. No respiratory distress.     Breath sounds: Normal breath sounds. No decreased breath sounds, wheezing or rales.  Chest:     Chest wall: No tenderness.  Abdominal:     General: There is no distension.  Musculoskeletal:     Cervical back: Normal range of motion.     Right lower leg: No edema.     Left lower leg: No edema.  Skin:    General: Skin is warm and dry.     Nails: There is no clubbing.  Neurological:     Mental Status: She is alert and oriented to person, place, and time.  Psychiatric:        Speech: Speech normal.        Behavior:  Behavior normal.        Thought Content: Thought content normal.        Judgment: Judgment normal.     Wt Readings from Last 3 Encounters:  07/31/22 178 lb 9 oz (81 kg)  04/29/22 183 lb 6 oz (83.2 kg)  03/22/22 185 lb (83.9 kg)     ASSESSMENT & PLAN:   Nonobstructive CAD with stable angina/exertional dyspnea: She is doing well and is without symptoms of angina or decompensation.  Recent LHC showed nonobstructive disease.  Continue aggressive risk factor modification and current medical therapy including aspirin, metoprolol, rosuvastatin, and as needed SL NTG.  No indication for further ischemic testing at this time.  HTN: Blood pressure is well controlled in the office today.  Continue current medical therapy.  HLD/hypertriglyceridemia: LDL 36 with a triglyceride of 319 in 05/2022.  She is uncertain if she was fasting for her most recent lipid panel.  She does eat a lot of crackers.  Lifestyle modification was discussed.  She remains on rosuvastatin.  LBBB: Stable on last EKG.  No presyncope or syncope.  Peripheral neuropathy/chronic back pain: These issues appear to be her main limiting factors.  Distal pedal pulses normal.  Follow-up with PCP.   Disposition: F/u with Dr. 06/2022 or an APP in 6 months.   Medication Adjustments/Labs and Tests Ordered: Current medicines  are reviewed at length with the patient today.  Concerns regarding medicines are outlined above. Medication changes, Labs and Tests ordered today are summarized above and listed in the Patient Instructions accessible in Encounters.   Signed, Eula Listen, PA-C 07/31/2022 2:32 PM     [facility] [place] [address] [place], [place] [address] [phone]

## 2022-07-31 ENCOUNTER — Encounter: Payer: Self-pay | Admitting: Physician Assistant

## 2022-07-31 ENCOUNTER — Ambulatory Visit: Payer: Medicare HMO | Attending: Physician Assistant | Admitting: Physician Assistant

## 2022-07-31 VITALS — BP 120/60 | HR 69 | Ht 70.0 in | Wt 178.6 lb

## 2022-07-31 DIAGNOSIS — I2510 Atherosclerotic heart disease of native coronary artery without angina pectoris: Secondary | ICD-10-CM

## 2022-07-31 DIAGNOSIS — E785 Hyperlipidemia, unspecified: Secondary | ICD-10-CM

## 2022-07-31 DIAGNOSIS — I447 Left bundle-branch block, unspecified: Secondary | ICD-10-CM | POA: Diagnosis not present

## 2022-07-31 DIAGNOSIS — I10 Essential (primary) hypertension: Secondary | ICD-10-CM

## 2022-07-31 MED ORDER — NITROGLYCERIN 0.4 MG SL SUBL: 0.4000 mg | SUBLINGUAL_TABLET | SUBLINGUAL | 0 refills | Status: AC | PRN

## 2022-07-31 NOTE — Patient Instructions (Signed)
Medication Instructions:  No changes at this time.   *If you need a refill on your cardiac medications before your next appointment, please call your pharmacy*   Lab Work: None  If you have labs (blood work) drawn today and your tests are completely normal, you will receive your results only by: MyChart Message (if you have MyChart) OR A paper copy in the mail If you have any lab test that is abnormal or we need to change your treatment, we will call you to review the results.   Testing/Procedures: None   Follow-Up: At [facility], you and your health needs are our priority.  As part of our continuing mission to provide you with exceptional heart care, we have created designated Provider Care Teams.  These Care Teams include your primary Cardiologist (physician) and Advanced Practice Providers (APPs -  Physician Assistants and Nurse Practitioners) who all work together to provide you with the care you need, when you need it.   Your next appointment:   6 month(s)  The format for your next appointment:   In Person  Provider:   Brian Agbor-Etang, MD or Ryan Dunn, PA-C        Important Information About Sugar       

## 2022-08-05 ENCOUNTER — Encounter (INDEPENDENT_AMBULATORY_CARE_PROVIDER_SITE_OTHER): Payer: Self-pay

## 2022-08-14 ENCOUNTER — Telehealth: Payer: Self-pay | Admitting: *Deleted

## 2022-08-14 NOTE — Patient Outreach (Signed)
  Care Coordination   08/14/2022 Name: [name] MRN: [id number] DOB: 05/30/1946   Care Coordination Outreach Attempts:  An unsuccessful telephone outreach was attempted today to offer the patient information about available care coordination services as a benefit of their health plan.   Follow Up Plan:  Additional outreach attempts will be made to offer the patient care coordination information and services.   Encounter Outcome:  No Answer  Care Coordination Interventions Activated:  No   Care Coordination Interventions:  No, not indicated    Kemper Durie, RN, MSN, [facility] Care Management Care Management Coordinator [phone]

## 2022-08-19 ENCOUNTER — Telehealth: Payer: Self-pay | Admitting: *Deleted

## 2022-08-19 NOTE — Patient Outreach (Signed)
  Care Coordination   08/19/2022 Name: [name] MRN: [id number] DOB: 1946/04/24   Care Coordination Outreach Attempts:  A second unsuccessful outreach was attempted today to offer the patient with information about available care coordination services as a benefit of their health plan.     Follow Up Plan:  Additional outreach attempts will be made to offer the patient care coordination information and services.   Encounter Outcome:  No Answer  Care Coordination Interventions Activated:  No   Care Coordination Interventions:  No, not indicated    Kemper Durie, RN, MSN, [facility] Care Management Care Management Coordinator [phone]

## 2022-08-23 ENCOUNTER — Telehealth: Payer: Self-pay | Admitting: *Deleted

## 2022-08-23 NOTE — Patient Outreach (Signed)
  Care Coordination   Initial Visit Note   08/23/2022 Name: DESERI LOSS MRN: [id number] DOB: Oct 31, 1945  Stevenson Clinch is a 76 y.o. year old female who sees Danella Penton, MD for primary care. I spoke with  Stevenson Clinch by phone today.  What matters to the patients health and wellness today?  Patient does not have time to talk today, request call back next week, prefers afternoon.     SDOH assessments and interventions completed:  No     Care Coordination Interventions Activated:  No  Care Coordination Interventions:  No, not indicated   Follow up plan:  Will plan 4th attempt to engage on 11/21    Encounter Outcome:  Pt. Request to Call Back   Kemper Durie, RN, MSN, [facility] Care Management Care Management Coordinator [phone]

## 2022-08-26 DIAGNOSIS — I10 Essential (primary) hypertension: Secondary | ICD-10-CM | POA: Diagnosis not present

## 2022-08-26 DIAGNOSIS — G894 Chronic pain syndrome: Secondary | ICD-10-CM | POA: Diagnosis not present

## 2022-08-26 DIAGNOSIS — F1120 Opioid dependence, uncomplicated: Secondary | ICD-10-CM | POA: Diagnosis not present

## 2022-08-26 DIAGNOSIS — Z79899 Other long term (current) drug therapy: Secondary | ICD-10-CM | POA: Diagnosis not present

## 2022-08-26 DIAGNOSIS — M79671 Pain in right foot: Secondary | ICD-10-CM | POA: Diagnosis not present

## 2022-08-26 DIAGNOSIS — E08621 Diabetes mellitus due to underlying condition with foot ulcer: Secondary | ICD-10-CM | POA: Diagnosis not present

## 2022-08-26 DIAGNOSIS — E0842 Diabetes mellitus due to underlying condition with diabetic polyneuropathy: Secondary | ICD-10-CM | POA: Diagnosis not present

## 2022-08-26 DIAGNOSIS — Z79891 Long term (current) use of opiate analgesic: Secondary | ICD-10-CM | POA: Diagnosis not present

## 2022-08-26 DIAGNOSIS — G8929 Other chronic pain: Secondary | ICD-10-CM | POA: Diagnosis not present

## 2022-08-27 ENCOUNTER — Telehealth: Payer: Self-pay | Admitting: *Deleted

## 2022-08-27 NOTE — Patient Outreach (Signed)
  Care Coordination   08/27/2022 Name: [name] MRN: [id number] DOB: September 06, 1946   Care Coordination Outreach Attempts:  A third unsuccessful outreach was attempted today to offer the patient with information about available care coordination services as a benefit of their health plan.   Follow Up Plan:  No further outreach attempts will be made at this time. We have been unable to contact the patient to offer or enroll patient in care coordination services  Encounter Outcome:  No Answer  Care Coordination Interventions Activated:  No   Care Coordination Interventions:  No, not indicated    Kemper Durie, RN, MSN, [facility] Care Management Care Management Coordinator [phone]

## 2022-10-28 DIAGNOSIS — E0842 Diabetes mellitus due to underlying condition with diabetic polyneuropathy: Secondary | ICD-10-CM | POA: Diagnosis not present

## 2022-10-28 DIAGNOSIS — M79671 Pain in right foot: Secondary | ICD-10-CM | POA: Diagnosis not present

## 2022-10-28 DIAGNOSIS — F1120 Opioid dependence, uncomplicated: Secondary | ICD-10-CM | POA: Diagnosis not present

## 2022-10-28 DIAGNOSIS — I10 Essential (primary) hypertension: Secondary | ICD-10-CM | POA: Diagnosis not present

## 2022-10-28 DIAGNOSIS — Z79891 Long term (current) use of opiate analgesic: Secondary | ICD-10-CM | POA: Diagnosis not present

## 2022-10-28 DIAGNOSIS — G894 Chronic pain syndrome: Secondary | ICD-10-CM | POA: Diagnosis not present

## 2022-10-28 DIAGNOSIS — G8929 Other chronic pain: Secondary | ICD-10-CM | POA: Diagnosis not present

## 2022-10-28 DIAGNOSIS — E08621 Diabetes mellitus due to underlying condition with foot ulcer: Secondary | ICD-10-CM | POA: Diagnosis not present

## 2022-11-21 IMAGING — MR MR LUMBAR SPINE W/O CM
4 of 5 series · 33 of 48 positions shown · non-contrast
Comparison: None.

CLINICAL DATA: Bilateral low back pain with sciatica, sciatica
laterality unspecified, unspecified chronicity.

EXAM:
MRI LUMBAR SPINE WITHOUT CONTRAST
TECHNIQUE: Multiplanar, multisequence MR imaging of the lumbar spine was
performed. No intravenous contrast was administered.

[Series 5: T2 · sagittal · 4.0mm · 0.81mm/px · 8 of 19 slices shown (1 of 2)]
[im 1/19]
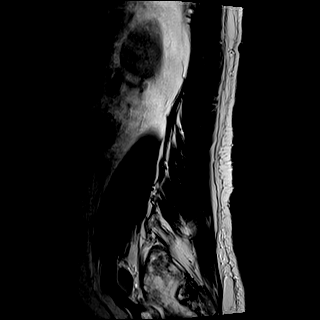
[im 3/19]
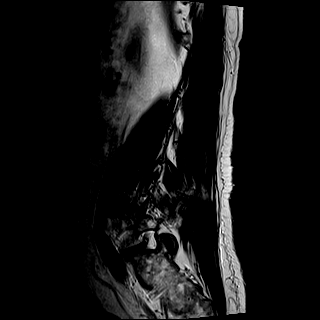
[im 6/19]
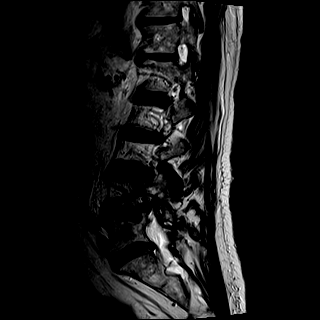
[im 8/19]
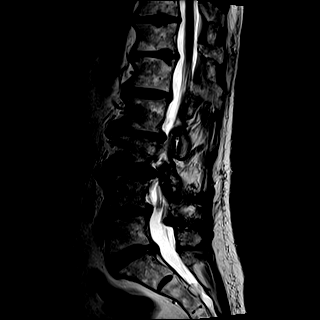
[im 11/19]
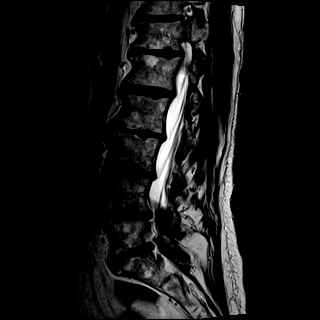
[im 13/19]
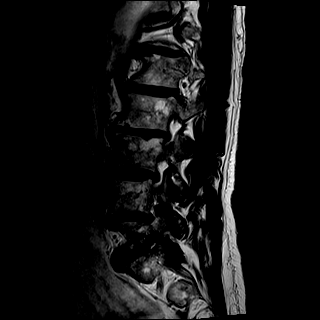
[im 16/19]
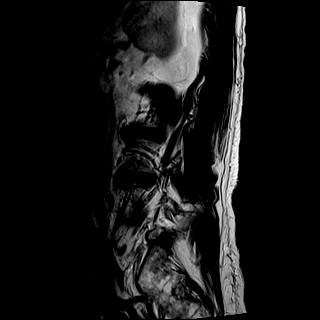
[im 19/19]
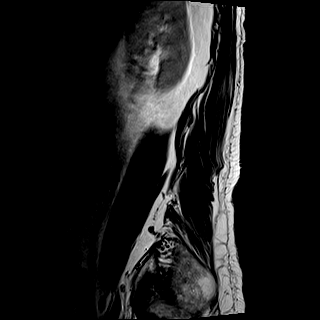

[Series 6: T1 · sagittal · 4.0mm · 0.81mm/px · 7 of 19 slices shown (1 of 2)]
[im 1/19]
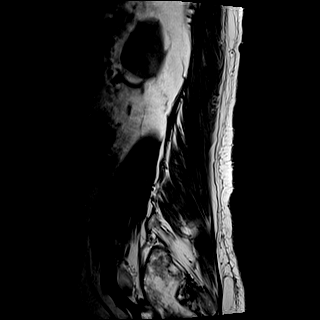
[im 4/19]
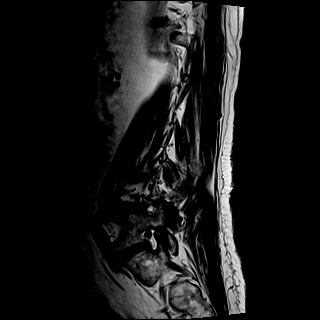
[im 7/19]
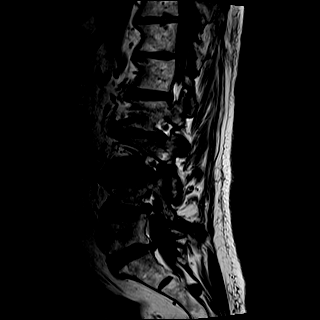
[im 10/19]
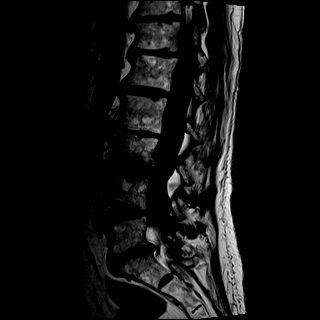
[im 13/19]
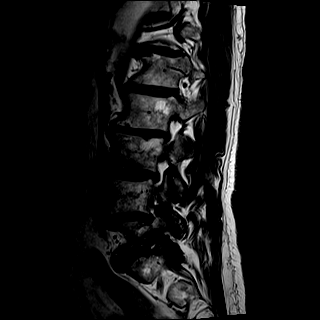
[im 16/19]
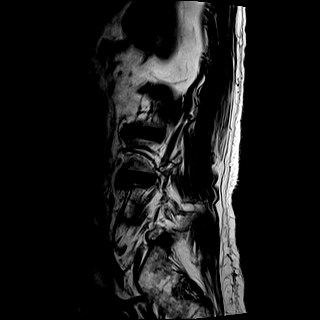
[im 19/19]
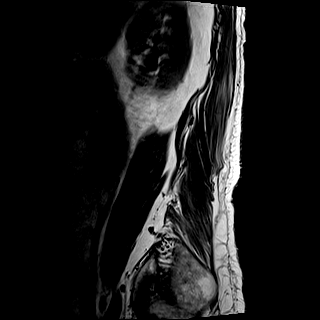

[Series 8: T2 · axial · 4.0mm · 0.78mm/px · z∈[-69,+141]mm · 9 of 36 slices shown (2 of 2)]
[im 1/36]
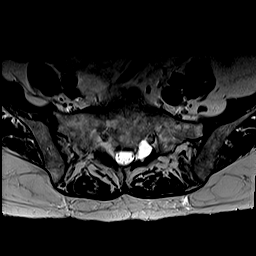
[im 6/36]
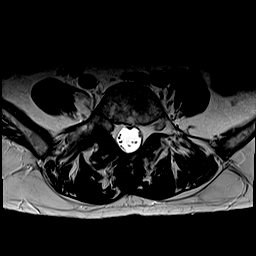
[im 12/36]
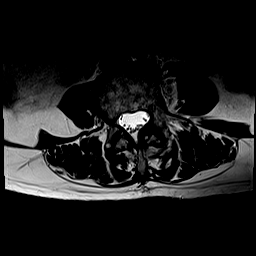
[im 15/36]
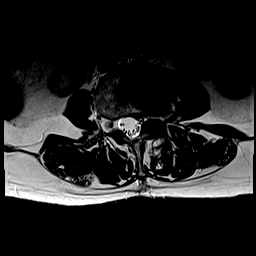
[im 18/36]
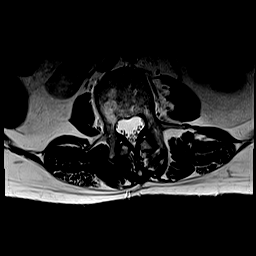
[im 21/36]
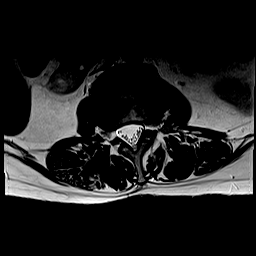
[im 24/36]
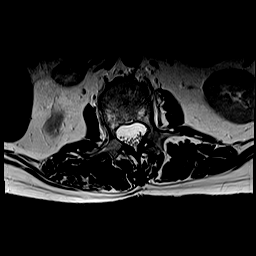
[im 30/36]
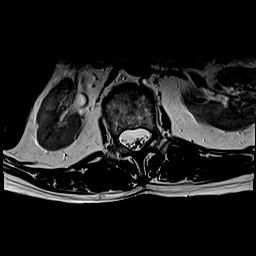
[im 36/36]
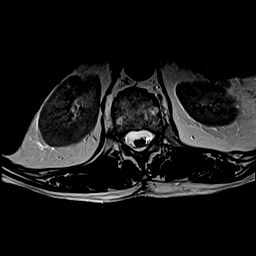

[Series 9: T1 · axial · 4.0mm · 0.39mm/px · z∈[-69,+141]mm · 9 of 36 slices shown (2 of 2)]
[im 1/36]
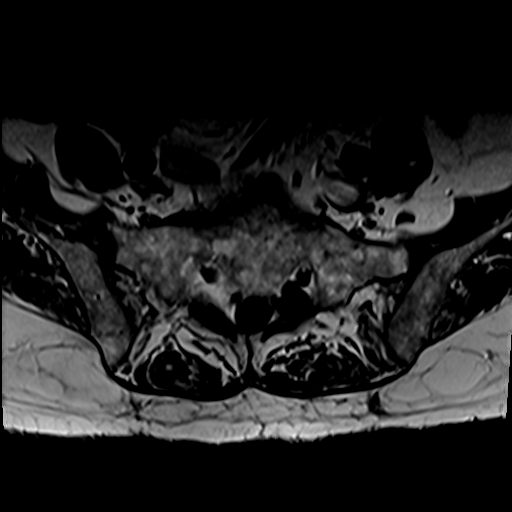
[im 6/36]
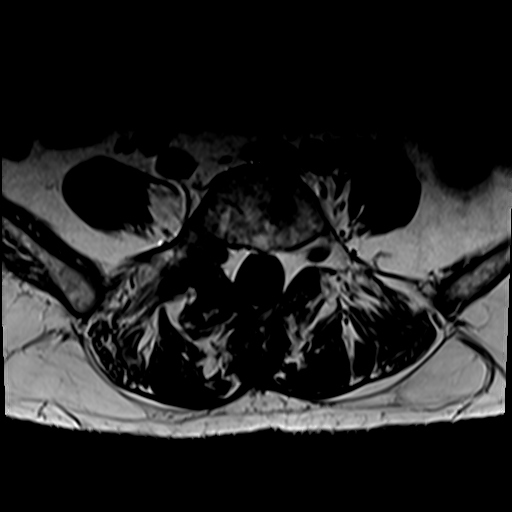
[im 12/36]
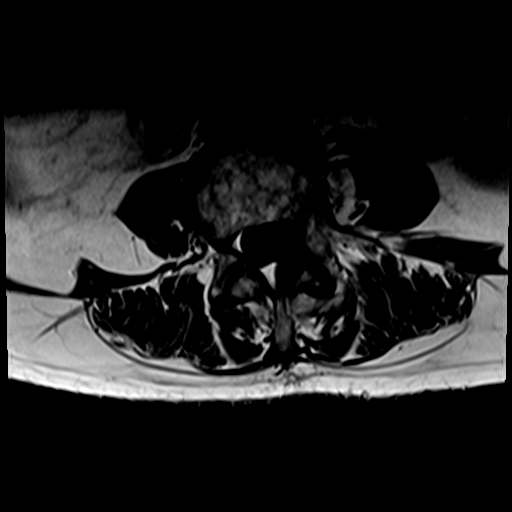
[im 15/36]
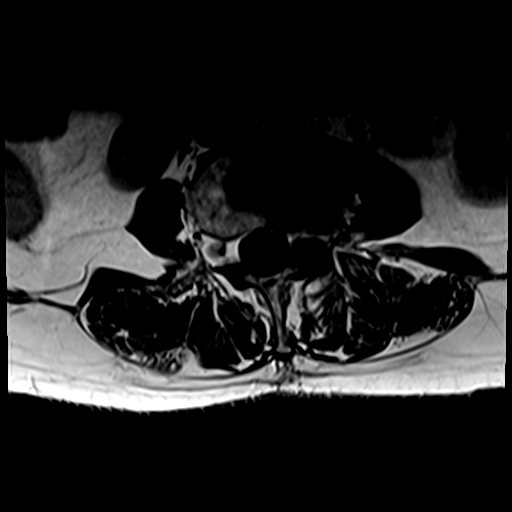
[im 18/36]
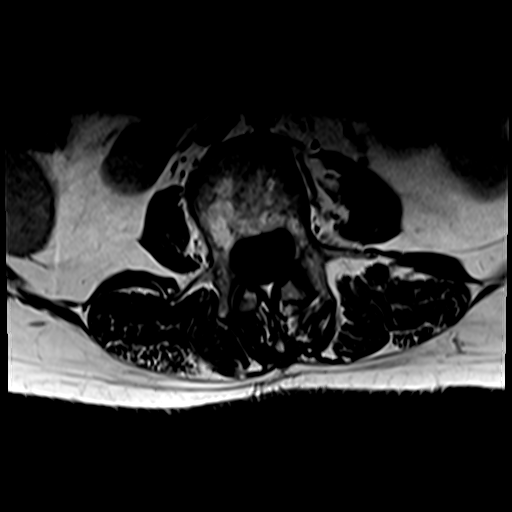
[im 21/36]
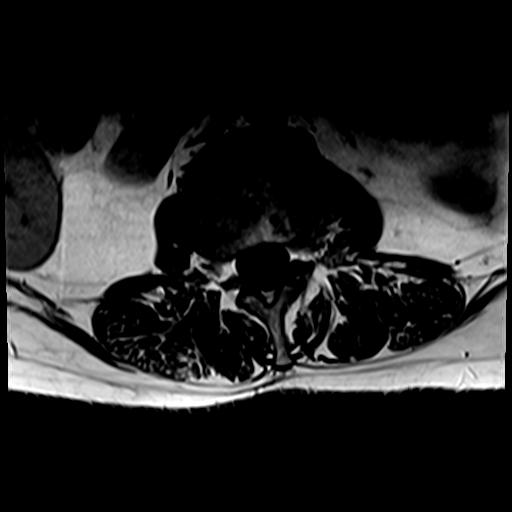
[im 24/36]
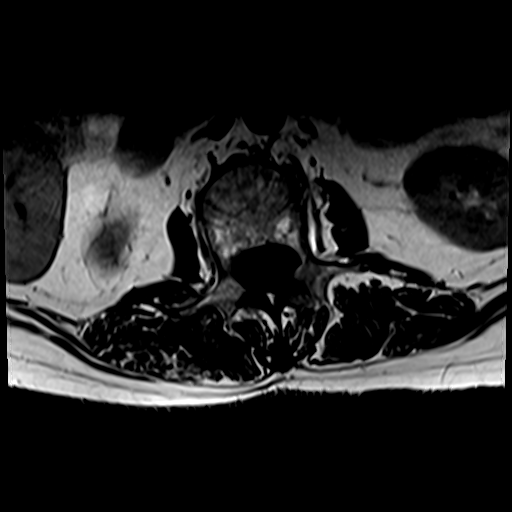
[im 30/36]
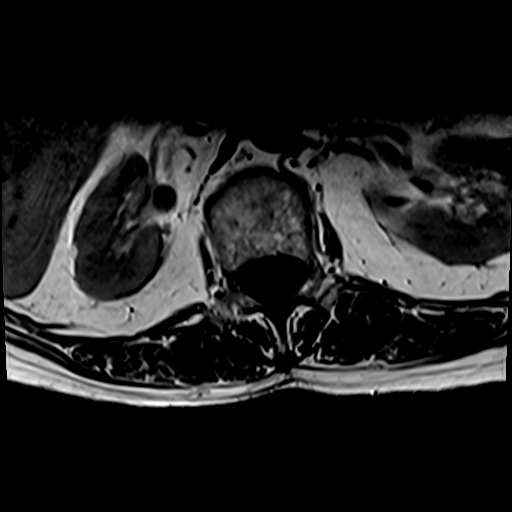
[im 36/36]
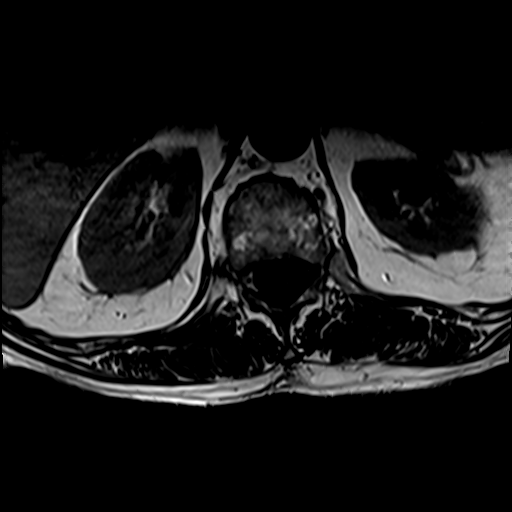

[33 of 48 positions shown; findings below may reference images not displayed]

FINDINGS: Segmentation:  Standard.

Alignment:  Dextroconvex scoliosis of the lumbar spine.

Vertebrae: No fracture, evidence of discitis, or bone lesion.
Endplate degenerative changes throughout the lumbar spine, more
pronounced at L3-4.

Conus medullaris and cauda equina: Conus extends to the L1 level.
Conus and cauda equina appear normal.

Paraspinal and other soft tissues: Negative.

Disc levels:

T12-L1: Shallow disc bulge. No spinal canal or neural foraminal
stenosis.

L1-2: Shallow disc bulge and mild facet degenerative changes without
significant spinal canal or neural foraminal stenosis.

L2-3: Loss of disc height, disc bulge mild facet degenerative
changes resulting in mild bilateral neural foraminal narrowing, left
greater than right. No significant spinal canal stenosis.

L3-4: Loss of disc height, left asymmetric disc bulge with
associated osteophytic component and moderate facet degenerative
changes resulting in narrowing of the left subarticular zone and
moderate left neural foraminal narrowing.

L4-5: Left asymmetric disc bulge with associated osteophytic
component and hypertrophic facet degenerative changes resulting in
narrowing of the bilateral subarticular zones, mild right and
moderate to severe left neural foraminal narrowing.

L5-S1: Right asymmetric disc bulge with superimposed small central
disc protrusion and moderate facet degenerative changes resulting in
severe right neural foraminal narrowing. No significant spinal canal
stenosis.
IMPRESSION: 1. Dextroscoliosis and lumbar spondylosis with multilevel neural
foraminal narrowing, severe on the right at L5-S1 and moderate on
the left at L3-4 and L4-5.
2. Narrowing of the left subarticular zone at L3-4 and bilaterally
at L4-5.
3. No high-grade spinal canal stenosis.

## 2022-12-04 DIAGNOSIS — E1140 Type 2 diabetes mellitus with diabetic neuropathy, unspecified: Secondary | ICD-10-CM | POA: Diagnosis not present

## 2022-12-04 DIAGNOSIS — E538 Deficiency of other specified B group vitamins: Secondary | ICD-10-CM | POA: Diagnosis not present

## 2022-12-11 DIAGNOSIS — Z1331 Encounter for screening for depression: Secondary | ICD-10-CM | POA: Diagnosis not present

## 2022-12-11 DIAGNOSIS — I509 Heart failure, unspecified: Secondary | ICD-10-CM | POA: Diagnosis not present

## 2022-12-11 DIAGNOSIS — L97919 Non-pressure chronic ulcer of unspecified part of right lower leg with unspecified severity: Secondary | ICD-10-CM | POA: Diagnosis not present

## 2022-12-11 DIAGNOSIS — E1140 Type 2 diabetes mellitus with diabetic neuropathy, unspecified: Secondary | ICD-10-CM | POA: Diagnosis not present

## 2022-12-11 DIAGNOSIS — Z0000 Encounter for general adult medical examination without abnormal findings: Secondary | ICD-10-CM | POA: Diagnosis not present

## 2022-12-25 DIAGNOSIS — G894 Chronic pain syndrome: Secondary | ICD-10-CM | POA: Diagnosis not present

## 2022-12-25 DIAGNOSIS — M79671 Pain in right foot: Secondary | ICD-10-CM | POA: Diagnosis not present

## 2022-12-25 DIAGNOSIS — Z79891 Long term (current) use of opiate analgesic: Secondary | ICD-10-CM | POA: Diagnosis not present

## 2022-12-25 DIAGNOSIS — E08621 Diabetes mellitus due to underlying condition with foot ulcer: Secondary | ICD-10-CM | POA: Diagnosis not present

## 2022-12-25 DIAGNOSIS — E0842 Diabetes mellitus due to underlying condition with diabetic polyneuropathy: Secondary | ICD-10-CM | POA: Diagnosis not present

## 2022-12-25 DIAGNOSIS — I10 Essential (primary) hypertension: Secondary | ICD-10-CM | POA: Diagnosis not present

## 2022-12-25 DIAGNOSIS — F1120 Opioid dependence, uncomplicated: Secondary | ICD-10-CM | POA: Diagnosis not present

## 2023-01-09 ENCOUNTER — Other Ambulatory Visit: Payer: Self-pay

## 2023-01-09 ENCOUNTER — Ambulatory Visit
Admission: RE | Admit: 2023-01-09 | Discharge: 2023-01-09 | Disposition: A | Discharge status: Home / Self Care | Payer: Medicare HMO | Source: Ambulatory Visit | Attending: Physician Assistant | Admitting: Physician Assistant

## 2023-01-09 DIAGNOSIS — M7989 Other specified soft tissue disorders: Secondary | ICD-10-CM | POA: Insufficient documentation

## 2023-02-12 DIAGNOSIS — E0842 Diabetes mellitus due to underlying condition with diabetic polyneuropathy: Secondary | ICD-10-CM | POA: Diagnosis not present

## 2023-02-12 DIAGNOSIS — M79671 Pain in right foot: Secondary | ICD-10-CM | POA: Diagnosis not present

## 2023-02-12 DIAGNOSIS — I10 Essential (primary) hypertension: Secondary | ICD-10-CM | POA: Diagnosis not present

## 2023-02-12 DIAGNOSIS — E08621 Diabetes mellitus due to underlying condition with foot ulcer: Secondary | ICD-10-CM | POA: Diagnosis not present

## 2023-02-12 DIAGNOSIS — Z79891 Long term (current) use of opiate analgesic: Secondary | ICD-10-CM | POA: Diagnosis not present

## 2023-02-12 DIAGNOSIS — F1120 Opioid dependence, uncomplicated: Secondary | ICD-10-CM | POA: Diagnosis not present

## 2023-02-12 DIAGNOSIS — G8929 Other chronic pain: Secondary | ICD-10-CM | POA: Diagnosis not present

## 2023-03-19 ENCOUNTER — Emergency Department
Admission: EM | Admit: 2023-03-19 | Discharge: 2023-03-19 | Disposition: A | Discharge status: Home / Self Care | Payer: Medicare HMO | Attending: Emergency Medicine | Admitting: Emergency Medicine

## 2023-03-19 ENCOUNTER — Emergency Department: Payer: Medicare HMO

## 2023-03-19 ENCOUNTER — Other Ambulatory Visit: Payer: Self-pay

## 2023-03-19 DIAGNOSIS — I10 Essential (primary) hypertension: Secondary | ICD-10-CM | POA: Insufficient documentation

## 2023-03-19 DIAGNOSIS — R0789 Other chest pain: Secondary | ICD-10-CM | POA: Diagnosis not present

## 2023-03-19 DIAGNOSIS — E119 Type 2 diabetes mellitus without complications: Secondary | ICD-10-CM | POA: Insufficient documentation

## 2023-03-19 DIAGNOSIS — I2510 Atherosclerotic heart disease of native coronary artery without angina pectoris: Secondary | ICD-10-CM | POA: Diagnosis not present

## 2023-03-19 DIAGNOSIS — R42 Dizziness and giddiness: Secondary | ICD-10-CM

## 2023-03-19 DIAGNOSIS — I6523 Occlusion and stenosis of bilateral carotid arteries: Secondary | ICD-10-CM

## 2023-03-19 DIAGNOSIS — I6529 Occlusion and stenosis of unspecified carotid artery: Secondary | ICD-10-CM | POA: Diagnosis not present

## 2023-03-19 LAB — CBC
HCT: 39.4 % (ref 36.0–46.0)
Hemoglobin: 13.1 g/dL (ref 12.0–15.0)
MCH: 29.6 pg (ref 26.0–34.0)
MCHC: 33.2 g/dL (ref 30.0–36.0)
MCV: 89.1 fL (ref 80.0–100.0)
Platelets: 190 10*3/uL (ref 150–400)
RBC: 4.42 MIL/uL (ref 3.87–5.11)
RDW: 13 % (ref 11.5–15.5)
WBC: 5.3 10*3/uL (ref 4.0–10.5)
nRBC: 0 % (ref 0.0–0.2)

## 2023-03-19 LAB — BASIC METABOLIC PANEL
Anion gap: 13 (ref 5–15)
BUN: 22 mg/dL (ref 8–23)
CO2: 22 mmol/L (ref 22–32)
Calcium: 9.4 mg/dL (ref 8.9–10.3)
Chloride: 104 mmol/L (ref 98–111)
Creatinine, Ser: 0.93 mg/dL (ref 0.44–1.00)
GFR, Estimated: >60 mL/min (ref >60)
Glucose, Bld: 158 mg/dL — ABNORMAL HIGH (ref 70–99)
Potassium: 4.1 mmol/L (ref 3.5–5.1)
Sodium: 139 mmol/L (ref 135–145)

## 2023-03-19 LAB — URINALYSIS, ROUTINE W REFLEX MICROSCOPIC
Bilirubin Urine: NEGATIVE
Glucose, UA: NEGATIVE mg/dL
Hgb urine dipstick: NEGATIVE
Ketones, ur: NEGATIVE mg/dL
Leukocytes,Ua: NEGATIVE
Nitrite: NEGATIVE
Protein, ur: NEGATIVE mg/dL
Specific Gravity, Urine: 1.006 (ref 1.005–1.030)
pH: 5 (ref 5.0–8.0)

## 2023-03-19 LAB — TROPONIN I (HIGH SENSITIVITY)
Troponin I (High Sensitivity): 13 ng/L (ref <18)
Troponin I (High Sensitivity): 15 ng/L (ref <18)

## 2023-03-19 MED ORDER — IOHEXOL 350 MG/ML SOLN
75.0000 mL | Freq: Once | INTRAVENOUS | Status: AC | PRN
  Administered 2023-03-19: 75 mL via INTRAVENOUS

## 2023-03-19 NOTE — ED Notes (Signed)
Dr Ray at bedside. 

## 2023-03-19 NOTE — ED Notes (Signed)
Patient denies any dizziness at rest or while walking to and from hallway bathroom

## 2023-03-19 NOTE — ED Provider Notes (Signed)
[facility] Provider Note    Event Date/Time   First MD Initiated Contact with Patient 03/19/23 1403     (approximate)   History   Dizziness   HPI  [name] is a 77 y.o. female with history of HTN, HLD, T2DM, left bundle branch block presenting to the emergency department for evaluation of dizziness.  Shortly prior to presentation, patient was driving her husband home when she had onset of dizziness described as a spinning sensation.  She reports that she had worsening spinning with development of lightheadedness.  She did pull over.  Did not pass out.  Episode lasted for about 1 minute.  Husband was around her and denies any dysarthria.  No focal weakness.  Around this time, she noticed that she did have some chest pain described as mild tightness in the center of her chest.  Reports that this has been ongoing for her and she takes nitroglycerin as needed.  Had a heart cath in June of last year that demonstrated mild CAD.  Did take 1 nitroglycerin today with resolution of her chest pain.  Currently reports dizziness is significantly improved, but does not feel completely back to normal.        Physical Exam   Triage Vital Signs: ED Triage Vitals  Enc Vitals Group     BP 03/19/23 1226 (!) 177/87     Pulse Rate 03/19/23 1226 68     Resp 03/19/23 1226 18     Temp 03/19/23 1226 98.2 F (36.8 C)     Temp Source 03/19/23 1226 Oral     SpO2 03/19/23 1226 95 %     Weight 03/19/23 1227 178 lb 9.2 oz (81 kg)     Height --      Head Circumference --      Peak Flow --      Pain Score 03/19/23 1227 0     Pain Loc --      Pain Edu? --      Excl. in GC? --     Most recent vital signs: Vitals:   03/19/23 1700 03/19/23 1736  BP:  (!) 187/77  Pulse: 66 67  Resp: 15 16  Temp:    SpO2: 93% 96%     General: Awake, interactive  CV:  Regular rate, good peripheral perfusion.  Resp:  Lungs clear, unlabored respirations.  Abd:  Soft, nondistended.   Neuro:  Keenly aware, correctly answering basic questions, following commands, normal extraocular movements, no visual field cut, normal facial asymmetry, 5 out of 5 strength in the bilateral upper and lower extremities without limb ataxia.  Normal sensation.  No aphasia, dysarthria, inattention.  ED Results / Procedures / Treatments   Labs (all labs ordered are listed, but only abnormal results are displayed) Labs Reviewed  BASIC METABOLIC PANEL - Abnormal; Notable for the following components:      Result Value   Glucose, Bld 158 (*)    All other components within normal limits  URINALYSIS, ROUTINE W REFLEX MICROSCOPIC - Abnormal; Notable for the following components:   Color, Urine STRAW (*)    APPearance CLEAR (*)    All other components within normal limits  CBC  CBG MONITORING, ED  TROPONIN I (HIGH SENSITIVITY)  TROPONIN I (HIGH SENSITIVITY)     EKG EKG independently reviewed interpreted by myself (ER attending) demonstrates:  EKG demonstrates normal sinus rhythm at a rate of 67, PR 180, QRS 144, QTc 481, left bundle branch  morphology noted, not new, no acute ST changes  RADIOLOGY Imaging independently reviewed and interpreted by myself demonstrates:  CT head without acute bleed  PROCEDURES:  Critical Care performed: No  Procedures   MEDICATIONS ORDERED IN ED: Medications  iohexol (OMNIPAQUE) 350 MG/ML injection 75 mL (75 mLs Intravenous Contrast Given 03/19/23 1525)     IMPRESSION / MDM / ASSESSMENT AND PLAN / ED COURSE  I reviewed the triage vital signs and the nursing notes.  Differential diagnosis includes, but is not limited to, arrhythmia, vertigo, anemia, electrolyte abnormality, ACS, TIA  Patient's presentation is most consistent with acute presentation with potential threat to life or bodily function.  77 year old female presenting with a 1 minute episode of dizziness described as spinning sensation with associated lightheadedness without syncope.   She has been placed on cardiac telemetry given concerns for possible arrhythmia.  Lab work overall reassuring.  EKG demonstrating no left bundle branch block.  Will obtain CTA head and neck to further evaluate.  Currently no focal deficits, no indication for code stroke activation.  CTA did return without acute abnormality, but did demonstrate bilateral ICA stenosis as well as bilateral PICA stenosis.  Clinical history overall not very suggestive of TIA and ABCD 2 score of 3, but with this finding, did review the case with Dr. Otelia Limes.  He felt that patient's clinical history was most likely reflective of peripheral vertigo.  He did not recommend starting full dose aspirin.  He did recommend outpatient follow-up for peripheral vertigo.  Patient updated on the results of her workup.  She is feeling improved.  Has been able to ambulate several times without recurrent symptoms.  Do not think there is an indication for meclizine currently.  She does report that she will follow-up with her primary care doctor for further evaluation.  Strict return precautions were provided.  She was discharged stable condition.       FINAL CLINICAL IMPRESSION(S) / ED DIAGNOSES   Final diagnoses:  Dizziness  Bilateral carotid artery stenosis     Rx / DC Orders   ED Discharge Orders     None        Note:  This document was prepared using Dragon voice recognition software and may include unintentional dictation errors.   Trinna Post, MD 03/19/23 [phone]

## 2023-03-19 NOTE — Discharge Instructions (Addendum)
You were seen in the emergency department today for evaluation of your episode of dizziness. Your lab work, EKG, and CT scan of your head fortunately did not show an emergency cause for this.  As we discussed, you do have some disease in the arteries in your neck.  I have included our radiology interpretation of your scan below.  I suspect your dizziness may be related to something called vertigo.  Please follow-up with your primary care doctor for further evaluation.  Please also let your cardiologist know that you were seen in the emergency department for an episode of dizziness with chest pain.  Return to the ER for any new or worsening symptoms.  IMPRESSION:  1. No acute intracranial process.  2. 65% stenosis in the proximal right ICA. Some ulcerated  noncalcified plaque is noted at the right carotid bifurcation.  3. 50% stenosis in the proximal left ICA and in the mid to distal  left common carotid artery.  4. No intracranial large vessel occlusion. 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  5. Aortic atherosclerosis.

## 2023-03-19 NOTE — ED Triage Notes (Addendum)
Pt presents to the ED due to an episode of dizziness while driving. Pt states she was "extremely dizziness" had a brief moment of CP but it resolved at the moment. Pt A&Ox4

## 2023-03-25 ENCOUNTER — Telehealth: Payer: Self-pay

## 2023-03-25 NOTE — Telephone Encounter (Signed)
Transition Care Management Unsuccessful Follow-up Telephone Call  Date of discharge and from where:  03/19/2023 [facility]  Attempts:  1st Attempt  Reason for unsuccessful TCM follow-up call:  Left voice message  Whitnie Deleon [facility]  [facility] Population Health Community Resource Care Guide   [email or web address]  ?? [phone]   Website: [email or web address]  [email or web address]

## 2023-03-26 ENCOUNTER — Telehealth: Payer: Self-pay

## 2023-03-26 NOTE — Telephone Encounter (Signed)
Transition Care Management Unsuccessful Follow-up Telephone Call  Date of discharge and from where:  03/19/2023 [facility]  Attempts:  2nd Attempt  Reason for unsuccessful TCM follow-up call:  Left voice message  Kaiyon Hynes [facility]  [facility] Population Health Community Resource Care Guide   [email or web address]  ?? [phone]   Website: [email or web address]

## 2023-04-01 ENCOUNTER — Other Ambulatory Visit: Payer: Self-pay | Admitting: Physician Assistant

## 2023-05-07 DIAGNOSIS — F1120 Opioid dependence, uncomplicated: Secondary | ICD-10-CM | POA: Diagnosis not present

## 2023-05-07 DIAGNOSIS — M79671 Pain in right foot: Secondary | ICD-10-CM | POA: Diagnosis not present

## 2023-05-07 DIAGNOSIS — I10 Essential (primary) hypertension: Secondary | ICD-10-CM | POA: Diagnosis not present

## 2023-05-07 DIAGNOSIS — G894 Chronic pain syndrome: Secondary | ICD-10-CM | POA: Diagnosis not present

## 2023-05-07 DIAGNOSIS — Z79891 Long term (current) use of opiate analgesic: Secondary | ICD-10-CM | POA: Diagnosis not present

## 2023-05-07 DIAGNOSIS — E0842 Diabetes mellitus due to underlying condition with diabetic polyneuropathy: Secondary | ICD-10-CM | POA: Diagnosis not present

## 2023-05-07 DIAGNOSIS — E08621 Diabetes mellitus due to underlying condition with foot ulcer: Secondary | ICD-10-CM | POA: Diagnosis not present

## 2023-06-13 DIAGNOSIS — L570 Actinic keratosis: Secondary | ICD-10-CM | POA: Diagnosis not present

## 2023-06-13 DIAGNOSIS — Z0000 Encounter for general adult medical examination without abnormal findings: Secondary | ICD-10-CM | POA: Diagnosis not present

## 2023-06-13 DIAGNOSIS — E785 Hyperlipidemia, unspecified: Secondary | ICD-10-CM | POA: Diagnosis not present

## 2023-06-13 DIAGNOSIS — E1140 Type 2 diabetes mellitus with diabetic neuropathy, unspecified: Secondary | ICD-10-CM | POA: Diagnosis not present

## 2023-06-13 DIAGNOSIS — Z79899 Other long term (current) drug therapy: Secondary | ICD-10-CM | POA: Diagnosis not present

## 2023-06-13 DIAGNOSIS — Z5986 Financial insecurity: Secondary | ICD-10-CM | POA: Diagnosis not present

## 2023-06-18 DIAGNOSIS — F1120 Opioid dependence, uncomplicated: Secondary | ICD-10-CM | POA: Diagnosis not present

## 2023-06-18 DIAGNOSIS — I10 Essential (primary) hypertension: Secondary | ICD-10-CM | POA: Diagnosis not present

## 2023-06-18 DIAGNOSIS — E0842 Diabetes mellitus due to underlying condition with diabetic polyneuropathy: Secondary | ICD-10-CM | POA: Diagnosis not present

## 2023-06-18 DIAGNOSIS — E08621 Diabetes mellitus due to underlying condition with foot ulcer: Secondary | ICD-10-CM | POA: Diagnosis not present

## 2023-06-18 DIAGNOSIS — M79671 Pain in right foot: Secondary | ICD-10-CM | POA: Diagnosis not present

## 2023-06-18 DIAGNOSIS — G894 Chronic pain syndrome: Secondary | ICD-10-CM | POA: Diagnosis not present

## 2023-06-18 DIAGNOSIS — Z79891 Long term (current) use of opiate analgesic: Secondary | ICD-10-CM | POA: Diagnosis not present

## 2023-07-04 ENCOUNTER — Other Ambulatory Visit: Payer: Self-pay | Admitting: Cardiovascular Disease

## 2023-07-04 NOTE — Telephone Encounter (Signed)
Please contact pt for future appointment. Pt overdue for 6 month f/u.  

## 2023-08-04 DIAGNOSIS — E0842 Diabetes mellitus due to underlying condition with diabetic polyneuropathy: Secondary | ICD-10-CM | POA: Diagnosis not present

## 2023-08-04 DIAGNOSIS — M79671 Pain in right foot: Secondary | ICD-10-CM | POA: Diagnosis not present

## 2023-08-04 DIAGNOSIS — I10 Essential (primary) hypertension: Secondary | ICD-10-CM | POA: Diagnosis not present

## 2023-08-04 DIAGNOSIS — G894 Chronic pain syndrome: Secondary | ICD-10-CM | POA: Diagnosis not present

## 2023-08-04 DIAGNOSIS — F1120 Opioid dependence, uncomplicated: Secondary | ICD-10-CM | POA: Diagnosis not present

## 2023-08-04 DIAGNOSIS — E08621 Diabetes mellitus due to underlying condition with foot ulcer: Secondary | ICD-10-CM | POA: Diagnosis not present

## 2023-08-04 DIAGNOSIS — Z79891 Long term (current) use of opiate analgesic: Secondary | ICD-10-CM | POA: Diagnosis not present

## 2023-09-10 NOTE — Progress Notes (Unsigned)
Cardiology Office Note    Date:  09/11/2023   ID:  [name], DOB Oct 29, 1945, MRN [id number]  PCP:  Danella Penton, MD  Cardiologist:  Lorine Bears, MD  Electrophysiologist:  None   Chief Complaint: Follow up  History of Present Illness:   [name] is a 77 y.o. female with history of nonobstructive CAD by LHC in 03/2022 with chronic angina, LBBB, aortic atherosclerosis, DM2, carotid artery disease, cerebrovascular disease, HTN, and HLD who presents for follow-up of CAD.   Prior LHC in 2008 showed minor irregularities.  She was admitted to the hospital in 09/2020 with atypical chest pain.  Troponin was normal.  Lexiscan MPI in 09/2020 showed no significant ischemia with an EF of 62%.  CT attenuated corrected images showed coronary artery calcification in the LAD and very mild aortic atherosclerosis.  Overall, this was a low risk scan.  Echo in 09/2020 demonstrated an EF of 55%, wall motion abnormality felt to be secondary bundle branch block, grade 1 diastolic dysfunction, mild LVH, normal RV systolic function and ventricular cavity size, and mild mitral regurgitation.  She was seen in 03/2022 and noted a 2 to 91-month history of change in her longstanding angina with noted exertional dyspnea, chest pain, and extreme fatigue.  Chest pain was nitro responsive and would improve with rest.  Given concerning symptoms, she underwent LHC on 03/22/2022 that showed mild nonobstructive CAD with 20% ostial to proximal LCx stenosis, 20% proximal RCA stenosis, and 30% mid RCA stenosis.  LVEF 55 to 65%.  Normal LVEDP.  Medical therapy was recommended.  Echo on 05/01/2022 demonstrated an EF of 65 to 70%, moderate LVH, grade 1 diastolic dysfunction, turbulent flow in the LVOT suggestive of possible LVOT obstruction, though significant gradient was not demonstrated by pulsed wave Doppler, normal RV systolic function and ventricular cavity size, trivial mitral regurgitation, and an estimated right atrial  pressure of 3 mmHg.  She was last seen in the office in 07/2022 and remained without symptoms of angina or cardiac decompensation.  She reported resolution of chest discomfort following cardiac cath.  No changes were indicated at that time.  She was seen in the ED in 03/2023 with dizziness felt to be related to vertigo.  Troponin was checked at that time and normal.  EKG showed sinus rhythm with known left bundle branch block.  CTA head/neck showed no acute intracranial process with 65% stenosis in the proximal right ICA and 50% stenosis in the proximal left ICA as well as moderate stenosis in the proximal posterior inferior cerebellar arteries bilaterally.  She comes in doing well from a cardiac perspective and is currently without symptoms of angina or cardiac decompensation.  She does note some intermittent chest tightness that appears to be more noticeable if she is overexerting herself or stressed.  She last had an episode of this chest discomfort during the passing of her husband in the summer months.  Episodes would typically last 10 to 15 minutes.  Over the past 14 months that she has taken SL NTG x 2 with these episodes.  She has been without episodes of chest discomfort for the past 4 months.  No associated dyspnea, palpitations, presyncope, or syncope.  Chest discomfort feels similar to what she has previously experienced.  No falls or symptoms concerning for bleeding.   Labs independently reviewed: 06/2023 - Hgb 12.6, PLT 201, potassium 4.3, BUN 24, serum creatinine 1.0, albumin 4.5, AST/ALT normal, TC 146, TG 261, HDL 39, LDL 54,  TSH normal, A1c 7.7  Past Medical History:  Diagnosis Date   Chest pain    a. reports multiple nl stress tests and nl cath in 2008.   Diabetes mellitus without complication (HCC)    History of echocardiogram    a. 07/2013 Echo: EF 50-55%. No rwma. Gr1 DD. Abnl septal motion consistent w/ LBBB. Mild MR/TR.   Hyperlipidemia    Hypertension    LBBB (left bundle  branch block)     Past Surgical History:  Procedure Laterality Date   endovascular laser N/A    LEFT HEART CATH AND CORONARY ANGIOGRAPHY N/A 03/22/2022   Procedure: LEFT HEART CATH AND CORONARY ANGIOGRAPHY;  Surgeon: Iran Ouch, MD;  Location: [facility] INVASIVE CV LAB;  Service: Cardiovascular;  Laterality: N/A;    Current Medications: Current Meds  Medication Sig   aspirin EC 81 MG tablet Take 81 mg by mouth daily. Swallow whole.   Cholecalciferol (VITAMIN D3) 25 MCG (1000 UT) CAPS Take 1,000 Units by mouth daily.   diclofenac Sodium (VOLTAREN) 1 % GEL Apply 2 g topically in the morning, at noon, in the evening, and at bedtime.   estradiol (ESTRACE) 0.1 MG/GM vaginal cream Place vaginally.   etodolac (LODINE) 400 MG tablet Take 400 mg by mouth daily.   furosemide (LASIX) 20 MG tablet Take by mouth daily.   gabapentin (NEURONTIN) 300 MG capsule Take 300 mg by mouth 3 (three) times daily.    glimepiride (AMARYL) 4 MG tablet Take 4 mg by mouth daily with breakfast.   isosorbide mononitrate (IMDUR) 60 MG 24 hr tablet TAKE 1 TABLET BY MOUTH DAILY   metFORMIN (GLUCOPHAGE-XR) 500 MG 24 hr tablet Take 1,000 mg by mouth every evening.   metoprolol succinate (TOPROL-XL) 50 MG 24 hr tablet Take 25 mg by mouth 2 (two) times daily.   nitroGLYCERIN (NITROSTAT) 0.4 MG SL tablet Place 1 tablet (0.4 mg total) under the tongue every 5 (five) minutes as needed for chest pain (no more than 3 tablets in a 15 minute period).   olmesartan (BENICAR) 40 MG tablet Take 40 mg by mouth daily.   omeprazole (PRILOSEC) 20 MG capsule Take 20 mg by mouth daily.   oxyCODONE (OXY IR/ROXICODONE) 5 MG immediate release tablet Take 5 mg by mouth every 8 (eight) hours as needed.   OZEMPIC, 0.25 OR 0.5 MG/DOSE, 2 MG/3ML SOPN as directed.   rosuvastatin (CRESTOR) 10 MG tablet Take 10 mg by mouth daily.   sertraline (ZOLOFT) 50 MG tablet Take 25 mg by mouth daily.   triamcinolone cream (KENALOG) 0.1 % Apply 1 application  topically 2 (two) times daily.    Allergies:   Patient has no known allergies.   Social History   Socioeconomic History   Marital status: Married    Spouse name: Not on file   Number of children: Not on file   Years of education: Not on file   Highest education level: Not on file  Occupational History   Not on file  Tobacco Use   Smoking status: Former    Current packs/day: 0.00    Average packs/day: 0.5 packs/day for 40.0 years (20.0 ttl pk-yrs)    Types: Cigarettes    Start date: 10/07/1978    Quit date: 10/07/2018    Years since quitting: 4.9   Smokeless tobacco: Never  Vaping Use   Vaping status: Never Used  Substance and Sexual Activity   Alcohol use: No   Drug use: No   Sexual activity: Not on  file  Other Topics Concern   Not on file  Social History Narrative   Lives outside of [place] w/ husband.  Walks 1/3 mile/day.   Social Determinants of Health   Financial Resource Strain: Medium Risk (06/30/2023)   Received from [facility]   Overall Financial Resource Strain (CARDIA)    Difficulty of Paying Living Expenses: Somewhat hard  Food Insecurity: No Food Insecurity (06/30/2023)   Received from [facility]   Hunger Vital Sign    Worried About Running Out of Food in the Last Year: Never true    Ran Out of Food in the Last Year: Never true  Transportation Needs: No Transportation Needs (06/30/2023)   [facility] from [facility]    In the past 12 months, has lack of transportation kept [name] from medical appointments or from getting medications?: No    Lack of Transportation (Non-Medical): No  Physical Activity: Inactive (06/30/2023)   Received from [facility]   Exercise Vital Sign    Days of Exercise per Week: 0 days    Minutes of Exercise per Session: 0 min  Stress: No Stress Concern Present (06/30/2023)   Received from [facility]  Occupational Health - Occupational Stress Questionnaire    Feeling of Stress : Only a little  Social Connections: Moderately Isolated (06/30/2023)   Received from [facility]   Social Connection and Isolation Panel [NHANES]    Frequency of Communication with Friends and Family: More than three times a week    Frequency of Social Gatherings with Friends and Family: More than three times a week    Attends Religious Services: More than 4 times per year    Active Member of [employer] or Organizations: No    Attends Banker Meetings: Never    Marital Status: Widowed     Family History:  The patient's family history includes Breast cancer in her maternal aunt; Celiac disease in her brother; Dementia in her mother; Heart attack in her father; Liver cancer in her sister.  ROS:   12-point review of systems is negative unless otherwise noted in the HPI.   EKGs/Labs/Other Studies Reviewed:    Studies reviewed were summarized above. The additional studies were reviewed today:  2D echo 05/01/2022: 1. Left ventricular ejection fraction, by estimation, is 65 to 70%. The  left ventricle has normal function. Left ventricular endocardial border  not optimally defined to evaluate regional wall motion. There is moderate  left ventricular hypertrophy. Left  ventricular diastolic parameters are consistent with Grade I diastolic  dysfunction (impaired relaxation). There is turbulent flow in the left  ventricular outflow tract suggestive of possible LVOT obstruction though  significant gradient is not  demonstrated by pulsed wave Doppler.   2. Right ventricular systolic function is normal. The right ventricular  size is normal. Tricuspid regurgitation signal is inadequate for assessing  PA pressure.   3. The mitral valve is normal in structure. Trivial mitral valve  regurgitation.   4. The aortic valve is tricuspid. Aortic valve regurgitation is not  visualized. No aortic  stenosis is present.   5. The inferior vena cava is normal in size with greater than 50%  respiratory variability, suggesting right atrial pressure of 3 mmHg.   Comparison(s): EF 55%, mild LVH. __________   [facility] 03/22/2022:   Prox RCA lesion is 20% stenosed.   Mid RCA lesion is  30% stenosed.   Ost Cx to Prox Cx lesion is 20% stenosed.   The left ventricular systolic function is normal.   LV end diastolic pressure is normal.   The left ventricular ejection fraction is 55-65% by visual estimate.   1.  Mild nonobstructive coronary artery disease. 2.  Normal LV systolic function and normal left ventricular end-diastolic pressure.   Recommendations: No culprit is identified for unstable angina.  Continue medical therapy. __________   Eugenie Birks MPI 09/15/2020: Pharmacological myocardial perfusion imaging study with no significant  Ischemia Small mild defect in the apical region, likely attenuation artifact  Normal wall motion, EF estimated at 62% No EKG changes concerning for ischemia at peak stress or in recovery. CT attenuation correction images with coronary calcification in the LAD, very mild in the aorta Low risk scan __________   2D echo 09/15/2020: 1. Left ventricular ejection fraction, by estimation, is 55%. The left  ventricle has normal function. Anteroseptal wall motion abnormality,  likely secondary to conduction abnormailty, bundle branch block. There is  mild left ventricular hypertrophy. Left   ventricular diastolic parameters are consistent with Grade I diastolic  dysfunction (impaired relaxation).   2. Right ventricular systolic function is normal. The right ventricular  size is normal.   3. The mitral valve is normal in structure. Mild mitral valve  regurgitation.   4. Challenging images, definity used __________   2D echo 07/27/2013: - Left ventricle: The cavity size was normal. There was mild    concentric hypertrophy. Systolic function was normal. The     estimated ejection fraction was in the range of 50% to    55%. Wall motion was normal; there were no regional wall    motion abnormalities. Doppler parameters are consistent    with abnormal left ventricular relaxation (grade 1    diastolic dysfunction).  - Ventricular septum: Septal motion showed abnormal    function. These changes are consistent with a left bundle    branch block.  - Mitral valve: Mild regurgitation.  - Tricuspid valve: Mild regurgitation.  - Pulmonary arteries: Systolic pressure was within the    normal range.   EKG:  EKG is ordered today.  The EKG ordered today demonstrates NSR, 80 bpm, LBBB  Recent Labs: 03/19/2023: BUN 22; Creatinine, Ser 0.93; Hemoglobin 13.1; Platelets 190; Potassium 4.1; Sodium 139  Recent Lipid Panel    Component Value Date/Time   CHOL 122 09/14/2020 1738   TRIG 151 (H) 09/14/2020 1738   HDL 49 09/14/2020 1738   CHOLHDL 2.5 09/14/2020 1738   VLDL 30 09/14/2020 1738   LDLCALC 43 09/14/2020 1738    PHYSICAL EXAM:    VS:  BP (!) 112/57 (BP Location: Left Arm, Patient Position: Sitting, Cuff Size: Normal)   Pulse 68   Ht 5\' 10"  (1.778 m)   Wt 183 lb 9.6 oz (83.3 kg)   SpO2 94%   BMI 26.34 kg/m   BMI: Body mass index is 26.34 kg/m.  Physical Exam Vitals reviewed.  Constitutional:      Appearance: She is well-developed.  HENT:     Head: Normocephalic and atraumatic.  Eyes:     General:        Right eye: No discharge.        Left eye: No discharge.  Neck:     Vascular: No JVD.  Cardiovascular:     Rate and Rhythm: Normal rate and regular rhythm.     Heart sounds: Normal heart sounds, S1 normal and S2  normal. Heart sounds not distant. No midsystolic click and no opening snap. No murmur heard.    No friction rub.  Pulmonary:     Effort: Pulmonary effort is normal. No respiratory distress.     Breath sounds: Normal breath sounds. No decreased breath sounds, wheezing, rhonchi or rales.  Chest:     Chest wall: No tenderness.   Abdominal:     General: There is no distension.  Musculoskeletal:     Cervical back: Normal range of motion.     Right lower leg: No edema.     Left lower leg: No edema.  Skin:    General: Skin is warm and dry.     Nails: There is no clubbing.  Neurological:     Mental Status: She is alert and oriented to person, place, and time.  Psychiatric:        Speech: Speech normal.        Behavior: Behavior normal.        Thought Content: Thought content normal.        Judgment: Judgment normal.     Wt Readings from Last 3 Encounters:  09/11/23 183 lb 9.6 oz (83.3 kg)  03/19/23 178 lb 9.2 oz (81 kg)  07/31/22 178 lb 9 oz (81 kg)     ASSESSMENT & PLAN:   Nonobstructive CAD with chest pain with moderate risk for cardiac etiology: Currently without symptoms of angina or cardiac decompensation.  LHC in 03/2022 showed nonobstructive disease as outlined above.  Over the past 14 months that she has noted episodes of chest discomfort that will typically last 10 to 15 minutes and are servicing with overexertion or increased stress.  She has taken SL NGT x 2.  Last episode of chest discomfort was in the setting of her husband passing 4 months ago.  Schedule myocardial PET/CT.  Continue aggressive risk factor modification and current medical therapy including aspirin 81 mg, Imdur 60 mg, Toprol-XL 25 mg twice daily, and rosuvastatin 10 mg.  HTN: Blood pressure is well-controlled in the office today.  She remains on Imdur 60 mg, Toprol-XL 25 mg twice daily, and olmesartan 40 mg.  Aortic atherosclerosis/HLD/hypertriglyceridemia: LDL 54 with triglyceride of 261 in 06/2023.  It is unclear if this was a fasting sample or not.  She remains on rosuvastatin 10 mg.  Ongoing management per PCP.  LBBB: No symptoms of near syncope or syncope.  Stable.  Prior echo with preserved LV systolic function.  Carotid artery disease: 65% stenosis in the proximal right ICA with 50% stenosis in the proximal left ICA noted on  CTA head neck in 03/2023.  At that time, there was also moderate stenosis in the proximal posterior inferior cerebral arteries bilaterally.  Will need follow-up imaging in the summer 2025.  Aspirin and statin as outlined above.   Informed Consent   Shared Decision Making/Informed Consent{  The risks [chest pain, shortness of breath, cardiac arrhythmias, dizziness, blood pressure fluctuations, myocardial infarction, stroke/transient ischemic attack, nausea, vomiting, allergic reaction, radiation exposure, metallic taste sensation and life-threatening complications (estimated to be 1 in 10,000)], benefits (risk stratification, diagnosing coronary artery disease, treatment guidance) and alternatives of a cardiac PET stress test were discussed in detail with Ms. Klingsporn and she agrees to proceed.        Disposition: F/u with Dr. Kirke Corin or an APP in 2 months.   Medication Adjustments/Labs and Tests Ordered: Current medicines are reviewed at length with the patient today.  Concerns regarding medicines are outlined  above. Medication changes, Labs and Tests ordered today are summarized above and listed in the Patient Instructions accessible in Encounters.   Signed, Eula Listen, PA-C 09/11/2023 3:14 PM     [facility] [place] [address] [place], [place] [address] [phone]

## 2023-09-11 ENCOUNTER — Encounter: Payer: Self-pay | Admitting: Physician Assistant

## 2023-09-11 ENCOUNTER — Ambulatory Visit: Payer: Medicare HMO | Attending: Physician Assistant | Admitting: Physician Assistant

## 2023-09-11 VITALS — BP 112/57 | HR 68 | Ht 70.0 in | Wt 183.6 lb

## 2023-09-11 DIAGNOSIS — I447 Left bundle-branch block, unspecified: Secondary | ICD-10-CM | POA: Diagnosis not present

## 2023-09-11 DIAGNOSIS — E785 Hyperlipidemia, unspecified: Secondary | ICD-10-CM

## 2023-09-11 DIAGNOSIS — I679 Cerebrovascular disease, unspecified: Secondary | ICD-10-CM

## 2023-09-11 DIAGNOSIS — I10 Essential (primary) hypertension: Secondary | ICD-10-CM

## 2023-09-11 DIAGNOSIS — R079 Chest pain, unspecified: Secondary | ICD-10-CM | POA: Diagnosis not present

## 2023-09-11 DIAGNOSIS — I2510 Atherosclerotic heart disease of native coronary artery without angina pectoris: Secondary | ICD-10-CM | POA: Diagnosis not present

## 2023-09-11 NOTE — Patient Instructions (Addendum)
Medication Instructions:  No changes *If you need a refill on your cardiac medications before your next appointment, please call your pharmacy*   Lab Work: None ordered If you have labs (blood work) drawn today and your tests are completely normal, you will receive your results only by: MyChart Message (if you have MyChart) OR A paper copy in the mail If you have any lab test that is abnormal or we need to change your treatment, we will call you to review the results.   Testing/Procedures: CARDIAC PET- Your physician has requested that you have a Cardiac Pet Stress Test.   This testing is completed at [facility] ([address] [place], [place] [address]) or [facility] ([address] [place], [place]). Please arrive 30 minutes prior to your scheduled time.  The schedulers will call you to get this scheduled. Please follow further testing instructions below.    Follow-Up: At [facility], you and your health needs are our priority.  As part of our continuing mission to provide you with exceptional heart care, we have created designated Provider Care Teams.  These Care Teams include your primary Cardiologist (physician) and Advanced Practice Providers (APPs -  Physician Assistants and Nurse Practitioners) who all work together to provide you with the care you need, when you need it.  We recommend signing up for the patient portal called "MyChart".  Sign up information is provided on this After Visit Summary.  MyChart is used to connect with patients for Virtual Visits (Telemedicine).  Patients are able to view lab/test results, encounter notes, upcoming appointments, etc.  Non-urgent messages can be sent to your provider as well.   To learn more about what you can do with MyChart, go to [email or web address].    Your next appointment:   2 month(s)  Provider:   You may see Lorine Bears, MD or one of the following Advanced  Practice Providers on your designated Care Team:   Eula Listen, PA-C    Please report to Radiology at the [facility] Main Entrance 30 minutes early for your test.  [address] [place], [place] [address]                         OR   Please report to Radiology at [facility] Main Entrance, medical mall, 30 mins prior to your test.  [address]  [place], [place]  [phone]  How to Prepare for Your Cardiac PET/CT Stress Test:   Medication instructions: Do not take erectile dysfunction medications for 72 hours prior to test (sildenafil, tadalafil) Do not take nitrates (isosorbide mononitrate, Ranexa) the day before or day of test Do not take tamsulosin the day before or morning of test Hold theophylline containing medications for 12 hours. Hold Dipyridamole 48 hours prior to the test.     Diabetic Preparation: If able to eat breakfast prior to 3 hour fasting, you may take all medications, including your insulin. Do not worry if you miss your breakfast dose of insulin - start at your next meal. If you do not eat prior to 3 hour fast-Hold all diabetes (oral and insulin) medications. Patients who wear a continuous glucose monitor MUST remove the device prior to scanning.  You may take your remaining medications with water.  2. Nothing to eat or drink, except water, 3 hours prior to arrival time.   NO caffeine/decaffeinated products, or chocolate 12 hours  prior to arrival. (Please note decaffeinated beverages (teas/coffees) still contain caffeine).  If you have caffeine within 12 hours prior, the test will need to be rescheduled.   3. NO perfume, cologne or lotion on chest or abdomen area. FEMALES - Please avoid wearing dresses to this appointment.  4. Total time is 1 to 2 hours; you may want to bring reading material for the waiting time.  IF YOU THINK YOU MAY BE PREGNANT, OR ARE NURSING PLEASE INFORM THE TECHNOLOGIST.  In preparation  for your appointment, medication and supplies will be purchased.  Appointment availability is limited, so if you need to cancel or reschedule, please call the Radiology Department at [phone] [facility]) OR [phone] [facility]) 24 hours in advance to avoid a cancellation fee of $100.00  What to Expect When you Arrive:  Once you arrive and check in for your appointment, you will be taken to a preparation room within the Radiology Department.  A technologist or Nurse will obtain your medical history, verify that you are correctly prepped for the exam, and explain the procedure.  Afterwards, an IV will be started in your arm and electrodes will be placed on your skin for EKG monitoring during the stress portion of the exam. Then you will be escorted to the PET/CT scanner.  There, staff will get you positioned on the scanner and obtain a blood pressure and EKG.  During the exam, you will continue to be connected to the EKG and blood pressure machines.  A small, safe amount of a radioactive tracer will be injected in your IV to obtain a series of pictures of your heart along with an injection of a stress agent.    After your Exam:  It is recommended that you eat a meal and drink a caffeinated beverage to counter act any effects of the stress agent.  Drink plenty of fluids for the remainder of the day and urinate frequently for the first couple of hours after the exam.  Your doctor will inform you of your test results within 7-10 business days.  For more information and frequently asked questions, please visit our website: [email or web address]  For questions about your test or how to prepare for your test, please call: Cardiac Imaging Nurse Navigators Office: [phone]

## 2023-09-15 DIAGNOSIS — I10 Essential (primary) hypertension: Secondary | ICD-10-CM | POA: Diagnosis not present

## 2023-09-15 DIAGNOSIS — M79671 Pain in right foot: Secondary | ICD-10-CM | POA: Diagnosis not present

## 2023-09-15 DIAGNOSIS — F1120 Opioid dependence, uncomplicated: Secondary | ICD-10-CM | POA: Diagnosis not present

## 2023-09-15 DIAGNOSIS — E08621 Diabetes mellitus due to underlying condition with foot ulcer: Secondary | ICD-10-CM | POA: Diagnosis not present

## 2023-09-15 DIAGNOSIS — G894 Chronic pain syndrome: Secondary | ICD-10-CM | POA: Diagnosis not present

## 2023-09-15 DIAGNOSIS — Z79891 Long term (current) use of opiate analgesic: Secondary | ICD-10-CM | POA: Diagnosis not present

## 2023-09-15 DIAGNOSIS — E0842 Diabetes mellitus due to underlying condition with diabetic polyneuropathy: Secondary | ICD-10-CM | POA: Diagnosis not present

## 2023-09-17 DIAGNOSIS — Z79891 Long term (current) use of opiate analgesic: Secondary | ICD-10-CM | POA: Diagnosis not present

## 2023-09-17 DIAGNOSIS — F1120 Opioid dependence, uncomplicated: Secondary | ICD-10-CM | POA: Diagnosis not present

## 2023-10-07 DIAGNOSIS — H25813 Combined forms of age-related cataract, bilateral: Secondary | ICD-10-CM | POA: Diagnosis not present

## 2023-10-07 DIAGNOSIS — H02831 Dermatochalasis of right upper eyelid: Secondary | ICD-10-CM | POA: Diagnosis not present

## 2023-10-07 DIAGNOSIS — H02834 Dermatochalasis of left upper eyelid: Secondary | ICD-10-CM | POA: Diagnosis not present

## 2023-10-07 DIAGNOSIS — H524 Presbyopia: Secondary | ICD-10-CM | POA: Diagnosis not present

## 2023-10-07 DIAGNOSIS — I10 Essential (primary) hypertension: Secondary | ICD-10-CM | POA: Diagnosis not present

## 2023-10-07 DIAGNOSIS — H35033 Hypertensive retinopathy, bilateral: Secondary | ICD-10-CM | POA: Diagnosis not present

## 2023-10-07 DIAGNOSIS — E119 Type 2 diabetes mellitus without complications: Secondary | ICD-10-CM | POA: Diagnosis not present

## 2023-10-23 ENCOUNTER — Other Ambulatory Visit: Payer: Self-pay | Admitting: Cardiovascular Disease

## 2023-11-04 ENCOUNTER — Telehealth: Payer: Self-pay | Admitting: *Deleted

## 2023-11-04 DIAGNOSIS — H25813 Combined forms of age-related cataract, bilateral: Secondary | ICD-10-CM | POA: Diagnosis not present

## 2023-11-04 DIAGNOSIS — Z01818 Encounter for other preprocedural examination: Secondary | ICD-10-CM | POA: Diagnosis not present

## 2023-11-04 DIAGNOSIS — H25811 Combined forms of age-related cataract, right eye: Secondary | ICD-10-CM | POA: Diagnosis not present

## 2023-11-04 NOTE — Telephone Encounter (Signed)
   Pre-operative Risk Assessment    Patient Name: [name]  DOB: 1946/04/06 MRN: [id number]   Date of last office visit: 09/11/23 Eula Listen, [facility] Date of next office visit: 11/18/23 [facility]   Request for Surgical Clearance    Procedure:   CATARACT EXTRACTION BY PE, IOL-RIGHT THEN LEFT EYE  Date of Surgery:  Clearance TBD                                Surgeon:  DR. Francee Piccolo DelMONTE Surgeon's Group or Practice Name:  [facility]  Phone number:  [phone] Fax number:  [phone]   Type of Clearance Requested:   - Medical : PER CLEARANCE FORM NO BLOOD THINNERS NEED TO BE HELD   Type of Anesthesia:   IV SEDATION   Additional requests/questions:    Elpidio Anis   11/04/2023, 2:33 PM

## 2023-11-05 ENCOUNTER — Other Ambulatory Visit: Payer: Self-pay | Admitting: Physician Assistant

## 2023-11-05 NOTE — Telephone Encounter (Signed)
Low risk procedure. But pt seen by Eula Listen, PA-C in 09/2023 w sx's of chest pain and Myocardial PET pending 11/11/23. Will wait until stress test is done. Tereso Newcomer, PA-C    11/05/2023 10:35 AM

## 2023-11-07 ENCOUNTER — Encounter (HOSPITAL_COMMUNITY): Payer: Self-pay

## 2023-11-10 ENCOUNTER — Telehealth (HOSPITAL_COMMUNITY): Payer: Self-pay | Admitting: *Deleted

## 2023-11-10 NOTE — Telephone Encounter (Signed)
Reaching out to patient to offer assistance regarding upcoming cardiac imaging study; pt verbalizes understanding of appt date/time, parking situation and where to check in, pre-test NPO status, and verified current allergies; name and call back number provided for further questions should they arise  Larey Brick RN Navigator Cardiac Imaging [facility] [phone] office [phone] cell  Patient aware to avoid caffeine and Imdur prior to her cardiac PET scan.

## 2023-11-11 ENCOUNTER — Encounter (HOSPITAL_COMMUNITY)
Admission: RE | Admit: 2023-11-11 | Discharge: 2023-11-11 | Disposition: A | Discharge status: Home / Self Care | Payer: Medicare HMO | Source: Ambulatory Visit | Attending: Physician Assistant | Admitting: Physician Assistant

## 2023-11-11 DIAGNOSIS — R079 Chest pain, unspecified: Secondary | ICD-10-CM | POA: Diagnosis not present

## 2023-11-11 MED ORDER — RUBIDIUM RB82 GENERATOR (RUBYFILL)
21.4300 | PACK | Freq: Once | INTRAVENOUS | Status: AC
  Administered 2023-11-11: 21.43 via INTRAVENOUS

## 2023-11-11 MED ORDER — REGADENOSON 0.4 MG/5ML IV SOLN
0.4000 mg | Freq: Once | INTRAVENOUS | Status: AC
  Administered 2023-11-11: 0.4 mg via INTRAVENOUS

## 2023-11-11 MED ORDER — REGADENOSON 0.4 MG/5ML IV SOLN
INTRAVENOUS | Status: AC
  Filled 2023-11-11: qty 5

## 2023-11-11 NOTE — Progress Notes (Signed)
Tolerated lexiscan well 

## 2023-11-12 LAB — NM PET CT CARDIAC PERFUSION MULTI W/ABSOLUTE BLOODFLOW
LV dias vol: 55 mL (ref 46–106)
LV sys vol: 65 mL
MBFR: 2
Rest MBF: 0.91 ml/g/min
Rest Nuclear Isotope Dose: 21.4 mCi
Stress MBF: 1.82 ml/g/min
Stress Nuclear Isotope Dose: 21.4 mCi

## 2023-11-17 DIAGNOSIS — F1120 Opioid dependence, uncomplicated: Secondary | ICD-10-CM | POA: Diagnosis not present

## 2023-11-17 DIAGNOSIS — G894 Chronic pain syndrome: Secondary | ICD-10-CM | POA: Diagnosis not present

## 2023-11-17 DIAGNOSIS — E08621 Diabetes mellitus due to underlying condition with foot ulcer: Secondary | ICD-10-CM | POA: Diagnosis not present

## 2023-11-17 DIAGNOSIS — I10 Essential (primary) hypertension: Secondary | ICD-10-CM | POA: Diagnosis not present

## 2023-11-17 DIAGNOSIS — M79671 Pain in right foot: Secondary | ICD-10-CM | POA: Diagnosis not present

## 2023-11-17 DIAGNOSIS — E0842 Diabetes mellitus due to underlying condition with diabetic polyneuropathy: Secondary | ICD-10-CM | POA: Diagnosis not present

## 2023-11-17 DIAGNOSIS — Z79891 Long term (current) use of opiate analgesic: Secondary | ICD-10-CM | POA: Diagnosis not present

## 2023-11-17 NOTE — Telephone Encounter (Signed)
   Patient Name: DONISHA FRACASSI  DOB: [birth year] MRN: [id number]  Primary Cardiologist: Antionette Kirks, MD  Chart reviewed as part of pre-operative protocol coverage. Cataract extractions are recognized in guidelines as low risk surgeries that do not typically require specific preoperative testing or holding of blood thinner therapy. Therefore, given past medical history and time since last visit, based on ACC/AHA guidelines, SAREENA BENEDICK would be at acceptable risk for the planned procedure without further cardiovascular testing.   I will route this recommendation to the requesting party via Epic fax function and remove from pre-op pool.  Please call with questions.  Gerldine Koch, NP-C  11/17/2023, 8:12 AM [address] Office [phone] Fax [phone]

## 2023-11-18 ENCOUNTER — Encounter: Payer: Self-pay | Admitting: Physician Assistant

## 2023-11-18 ENCOUNTER — Ambulatory Visit: Payer: Medicare HMO | Attending: Physician Assistant | Admitting: Physician Assistant

## 2023-11-18 VITALS — BP 140/74 | HR 62 | Ht 70.0 in | Wt 190.0 lb

## 2023-11-18 DIAGNOSIS — I6523 Occlusion and stenosis of bilateral carotid arteries: Secondary | ICD-10-CM | POA: Diagnosis not present

## 2023-11-18 DIAGNOSIS — I2510 Atherosclerotic heart disease of native coronary artery without angina pectoris: Secondary | ICD-10-CM

## 2023-11-18 DIAGNOSIS — I10 Essential (primary) hypertension: Secondary | ICD-10-CM

## 2023-11-18 DIAGNOSIS — I447 Left bundle-branch block, unspecified: Secondary | ICD-10-CM | POA: Diagnosis not present

## 2023-11-18 DIAGNOSIS — E785 Hyperlipidemia, unspecified: Secondary | ICD-10-CM

## 2023-11-18 DIAGNOSIS — I700 Atherosclerosis of aorta: Secondary | ICD-10-CM | POA: Diagnosis not present

## 2023-11-18 MED ORDER — CARVEDILOL 25 MG PO TABS: 25.0000 mg | ORAL_TABLET | Freq: Two times a day (BID) | ORAL | 3 refills | Status: AC

## 2023-11-18 NOTE — Patient Instructions (Signed)
Medication Instructions:  Your physician recommends the following medication changes.  STOP TAKING: Metoprolol   START TAKING: Coreg 25 mg twice daily  *If you need a refill on your cardiac medications before your next appointment, please call your pharmacy*   Lab Work: None ordered at this time  If you have labs (blood work) drawn today and your tests are completely normal, you will receive your results only by: MyChart Message (if you have MyChart) OR A paper copy in the mail If you have any lab test that is abnormal or we need to change your treatment, we will call you to review the results.   Follow-Up: At [facility], you and your health needs are our priority.  As part of our continuing mission to provide you with exceptional heart care, we have created designated Provider Care Teams.  These Care Teams include your primary Cardiologist (physician) and Advanced Practice Providers (APPs -  Physician Assistants and Nurse Practitioners) who all work together to provide you with the care you need, when you need it.   Your next appointment:   3 month(s)  Provider:   You may see Lorine Bears, MD or one of the following Advanced Practice Providers on your designated Care Team:   Nicolasa Ducking, NP Eula Listen, PA-C Cadence Fransico Michael, PA-C Charlsie Quest, NP Carlos Levering, NP

## 2023-11-18 NOTE — Progress Notes (Signed)
Cardiology Office Note    Date:  11/18/2023   ID:  FREDDI FORSTER, DOB [birth year], MRN [id number]  PCP:  Danella Penton, MD  Cardiologist:  Lorine Bears, MD  Electrophysiologist:  None   Chief Complaint: Follow-up  History of Present Illness:   [name] is a 78 y.o. 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   Prior LHC in 2008 showed minor irregularities.  She was admitted to the hospital in 09/2020 with atypical chest pain.  Troponin was normal.  Lexiscan MPI in 09/2020 showed no significant ischemia with an EF of 62%.  CT attenuated corrected images showed coronary artery calcification in the LAD and very mild aortic atherosclerosis.  Overall, this was a low risk scan.  Echo in 09/2020 demonstrated an EF of 55%, wall motion abnormality felt to be secondary bundle branch block, grade 1 diastolic dysfunction, mild LVH, normal RV systolic function and ventricular cavity size, and mild mitral regurgitation.  She was seen in 03/2022 and noted a 2 to 26-month history of change in her longstanding angina with noted exertional dyspnea, chest pain, and extreme fatigue.  Chest pain was nitro responsive and would improve with rest.  Given concerning symptoms, she underwent LHC on 03/22/2022 that showed mild nonobstructive CAD with 20% ostial to proximal LCx stenosis, 20% proximal RCA stenosis, and 30% mid RCA stenosis.  LVEF 55 to 65%.  Normal LVEDP.  Medical therapy was recommended.  Echo on 05/01/2022 demonstrated an EF of 65 to 70%, moderate LVH, grade 1 diastolic dysfunction, turbulent flow in the LVOT suggestive of possible LVOT obstruction, though significant gradient was not demonstrated by pulsed wave Doppler, normal RV systolic function and ventricular cavity size, trivial mitral regurgitation, and an estimated right  atrial pressure of 3 mmHg.  She was seen in the office in 07/2022 and reported resolution of chest discomfort following cardiac cath.  She was seen in the ED in 03/2023 with dizziness felt to be related to vertigo.  Troponin was checked at that time and normal.  EKG showed sinus rhythm with known left bundle branch block.  CTA head/neck showed no acute intracranial process with 65% stenosis in the proximal right ICA and 50% stenosis in the proximal left ICA as well as moderate stenosis in the proximal posterior inferior cerebellar arteries bilaterally.  She was last seen in the office in 09/2023 and noted intermittent chest tightness, though had been symptom-free for 4 months leading up to her appointment.  Myocardial PET/CT on 11/11/2023 showed no evidence of ischemia or infarction and was overall low risk.  Coronary artery calcification was noted in the LAD and RCA distributions.  She comes in doing well from a cardiac perspective and has been without symptoms of angina or cardiac decompensation.  No further chest pain.  No dyspnea, palpitations, dizziness, presyncope, or syncope.  She does note her blood pressure has been running high lately with a rare reading in the 170s up to 190s systolic.  She is unaware of any recent changes.  Adherent to cardiac medications.  No falls or symptoms concerning for bleeding.   Labs independently reviewed: 06/2023 - Hgb 12.6, PLT 201, potassium 4.3, BUN 24, serum creatinine 1.0, albumin 4.5, AST/ALT normal, TC 146, TG 261, HDL 39, LDL 54, TSH normal, A1c 7.7   Past Medical History:  Diagnosis Date  Chest pain    a. reports multiple nl stress tests and nl cath in 2008.   Diabetes mellitus without complication (HCC)    History of echocardiogram    a. 07/2013 Echo: EF 50-55%. No rwma. Gr1 DD. Abnl septal motion consistent w/ LBBB. Mild MR/TR.   Hyperlipidemia    Hypertension    LBBB (left bundle branch block)     Past Surgical History:  Procedure Laterality  Date   endovascular laser N/A    LEFT HEART CATH AND CORONARY ANGIOGRAPHY N/A 03/22/2022   Procedure: LEFT HEART CATH AND CORONARY ANGIOGRAPHY;  Surgeon: Iran Ouch, MD;  Location: [facility] INVASIVE CV LAB;  Service: Cardiovascular;  Laterality: N/A;    Current Medications: Current Meds  Medication Sig   aspirin EC 81 MG tablet Take 81 mg by mouth daily. Swallow whole.   carvedilol (COREG) 25 MG tablet Take 1 tablet (25 mg total) by mouth 2 (two) times daily.   Cholecalciferol (VITAMIN D3) 25 MCG (1000 UT) CAPS Take 1,000 Units by mouth daily.   cyanocobalamin (,VITAMIN B-12,) 1000 MCG/ML injection Inject into the muscle.   diclofenac Sodium (VOLTAREN) 1 % GEL Apply 2 g topically in the morning, at noon, in the evening, and at bedtime.   estradiol (ESTRACE) 0.1 MG/GM vaginal cream Place vaginally.   etodolac (LODINE) 400 MG tablet Take 400 mg by mouth daily.   furosemide (LASIX) 20 MG tablet Take by mouth daily.   gabapentin (NEURONTIN) 300 MG capsule Take 300 mg by mouth 3 (three) times daily.    glimepiride (AMARYL) 4 MG tablet Take 4 mg by mouth daily with breakfast.   isosorbide mononitrate (IMDUR) 60 MG 24 hr tablet Take 1 tablet (60 mg total) by mouth daily.   metFORMIN (GLUCOPHAGE-XR) 500 MG 24 hr tablet Take 1,000 mg by mouth every evening.   olmesartan (BENICAR) 40 MG tablet Take 40 mg by mouth daily.   omeprazole (PRILOSEC) 20 MG capsule Take 20 mg by mouth daily.   oxyCODONE (OXY IR/ROXICODONE) 5 MG immediate release tablet Take 5 mg by mouth every 8 (eight) hours as needed.   OZEMPIC, 0.25 OR 0.5 MG/DOSE, 2 MG/3ML SOPN as directed.   rosuvastatin (CRESTOR) 10 MG tablet Take 10 mg by mouth daily.   sertraline (ZOLOFT) 50 MG tablet Take 25 mg by mouth daily.   triamcinolone cream (KENALOG) 0.1 % Apply 1 application topically 2 (two) times daily.   [DISCONTINUED] metoprolol succinate (TOPROL-XL) 50 MG 24 hr tablet Take 25 mg by mouth 2 (two) times daily.    Allergies:    Patient has no known allergies.   Social History   Socioeconomic History   Marital status: Married    Spouse name: Not on file   Number of children: Not on file   Years of education: Not on file   Highest education level: Not on file  Occupational History   Not on file  Tobacco Use   Smoking status: Former    Current packs/day: 0.00    Average packs/day: 0.5 packs/day for 40.0 years (20.0 ttl pk-yrs)    Types: Cigarettes    Start date: 10/07/1978    Quit date: 10/07/2018    Years since quitting: 5.1   Smokeless tobacco: Never  Vaping Use   Vaping status: Never Used  Substance and Sexual Activity   Alcohol use: No   Drug use: No   Sexual activity: Not on file  Other Topics Concern   Not on file  Social History Narrative  Lives outside of [place] w/ husband.  Walks 1/3 mile/day.   Social Drivers of Health   Financial Resource Strain: Medium Risk (06/30/2023)   Received from [facility]   Overall Financial Resource Strain (CARDIA)    Difficulty of Paying Living Expenses: Somewhat hard  Food Insecurity: No Food Insecurity (06/30/2023)   Received from [facility]   Hunger Vital Sign    Worried About Running Out of Food in the Last Year: Never true    Ran Out of Food in the Last Year: Never true  Transportation Needs: No Transportation Needs (06/30/2023)   Received from [facility] - Transportation    In the past 12 months, has lack of transportation kept you from medical appointments or from getting medications?: No    Lack of Transportation (Non-Medical): No  Physical Activity: Inactive (06/30/2023)   Received from [facility]   Exercise Vital Sign    Days of Exercise per Week: 0 days    Minutes of Exercise per Session: 0 min  Stress: No Stress Concern Present (06/30/2023)   Received from [facility] - Occupational Stress Questionnaire     Feeling of Stress : Only a little  Social Connections: Moderately Isolated (06/30/2023)   Received from [facility]   Social Connection and Isolation Panel [NHANES]    Frequency of Communication with Friends and Family: More than three times a week    Frequency of Social Gatherings with Friends and Family: More than three times a week    Attends Religious Services: More than 4 times per year    Active Member of [employer] or Organizations: No    Attends Banker Meetings: Never    Marital Status: Widowed     Family History:  The patient's family history includes Breast cancer in her maternal aunt; Celiac disease in her brother; Dementia in her mother; Heart attack in her father; Liver cancer in her sister.  ROS:   12-point review of systems is negative unless otherwise noted in the HPI.   EKGs/Labs/Other Studies Reviewed:    Studies reviewed were summarized above. The additional studies were reviewed today:  Myocardial PET/CT 11/11/2023:   The study is normal. The study is low risk.   LV perfusion is normal. There is no evidence of ischemia. There is no evidence of infarction.   Rest left ventricular function is normal. Stress left ventricular function is normal. End diastolic cavity size is normal. End systolic cavity size is normal.   Myocardial blood flow was computed to be 0.57ml/g/min at rest and 1.47ml/g/min at stress. Global myocardial blood flow reserve was 2.00 and was normal.   Coronary calcium was present on the attenuation correction CT images. Moderate coronary calcifications were present. Coronary calcifications were present in the left anterior descending artery and right coronary artery distribution(s). __________  2D echo 05/01/2022: 1. Left ventricular ejection fraction, by estimation, is 65 to 70%. The  left ventricle has normal function. Left ventricular endocardial border  not optimally defined to evaluate regional wall motion. There is  moderate  left ventricular hypertrophy. Left  ventricular diastolic parameters are consistent with Grade I diastolic  dysfunction (impaired relaxation). There is turbulent flow in the left  ventricular outflow tract suggestive of possible LVOT obstruction though  significant gradient is not  demonstrated by pulsed wave Doppler.   2. Right ventricular systolic function is normal. The  right ventricular  size is normal. Tricuspid regurgitation signal is inadequate for assessing  PA pressure.   3. The mitral valve is normal in structure. Trivial mitral valve  regurgitation.   4. The aortic valve is tricuspid. Aortic valve regurgitation is not  visualized. No aortic stenosis is present.   5. The inferior vena cava is normal in size with greater than 50%  respiratory variability, suggesting right atrial pressure of 3 mmHg.   Comparison(s): EF 55%, mild LVH. __________   [facility] 03/22/2022:   Prox RCA lesion is 20% stenosed.   Mid RCA lesion is 30% stenosed.   Ost Cx to Prox Cx lesion is 20% stenosed.   The left ventricular systolic function is normal.   LV end diastolic pressure is normal.   The left ventricular ejection fraction is 55-65% by visual estimate.   1.  Mild nonobstructive coronary artery disease. 2.  Normal LV systolic function and normal left ventricular end-diastolic pressure.   Recommendations: No culprit is identified for unstable angina.  Continue medical therapy. __________   Eugenie Birks MPI 09/15/2020: Pharmacological myocardial perfusion imaging study with no significant  Ischemia Small mild defect in the apical region, likely attenuation artifact  Normal wall motion, EF estimated at 62% No EKG changes concerning for ischemia at peak stress or in recovery. CT attenuation correction images with coronary calcification in the LAD, very mild in the aorta Low risk scan __________   2D echo 09/15/2020: 1. Left ventricular ejection fraction, by estimation, is 55%. The  left  ventricle has normal function. Anteroseptal wall motion abnormality,  likely secondary to conduction abnormailty, bundle branch block. There is  mild left ventricular hypertrophy. Left   ventricular diastolic parameters are consistent with Grade I diastolic  dysfunction (impaired relaxation).   2. Right ventricular systolic function is normal. The right ventricular  size is normal.   3. The mitral valve is normal in structure. Mild mitral valve  regurgitation.   4. Challenging images, definity used __________   2D echo 07/27/2013: - Left ventricle: The cavity size was normal. There was mild    concentric hypertrophy. Systolic function was normal. The    estimated ejection fraction was in the range of 50% to    55%. Wall motion was normal; there were no regional wall    motion abnormalities. Doppler parameters are consistent    with abnormal left ventricular relaxation (grade 1    diastolic dysfunction).  - Ventricular septum: Septal motion showed abnormal    function. These changes are consistent with a left bundle    branch block.  - Mitral valve: Mild regurgitation.  - Tricuspid valve: Mild regurgitation.  - Pulmonary arteries: Systolic pressure was within the    normal range.   EKG:  EKG is ordered today.  The EKG ordered today demonstrates NSR, 62 bpm, LBBB  Recent Labs: 03/19/2023: BUN 22; Creatinine, Ser 0.93; Hemoglobin 13.1; Platelets 190; Potassium 4.1; Sodium 139  Recent Lipid Panel    Component Value Date/Time   CHOL 122 09/14/2020 1738   TRIG 151 (H) 09/14/2020 1738   HDL 49 09/14/2020 1738   CHOLHDL 2.5 09/14/2020 1738   VLDL 30 09/14/2020 1738   LDLCALC 43 09/14/2020 1738    PHYSICAL EXAM:    VS:  BP (!) 140/74 (BP Location: Left Arm, Patient Position: Sitting, Cuff Size: Normal)   Pulse 62   Ht 5\' 10"  (1.778 m)   Wt 190 lb (86.2 kg)   SpO2 95%   BMI 27.26 kg/m  BMI: Body mass index is 27.26 kg/m.  Physical Exam Constitutional:       Appearance: She is well-developed.  HENT:     Head: Normocephalic and atraumatic.  Eyes:     General:        Right eye: No discharge.        Left eye: No discharge.  Cardiovascular:     Rate and Rhythm: Normal rate and regular rhythm.     Heart sounds: Normal heart sounds, S1 normal and S2 normal. Heart sounds not distant. No midsystolic click and no opening snap. No murmur heard.    No friction rub.  Pulmonary:     Effort: Pulmonary effort is normal. No respiratory distress.     Breath sounds: Normal breath sounds. No decreased breath sounds, wheezing, rhonchi or rales.  Chest:     Chest wall: No tenderness.  Musculoskeletal:     Cervical back: Normal range of motion.  Skin:    General: Skin is warm and dry.     Nails: There is no clubbing.  Neurological:     Mental Status: She is alert and oriented to person, place, and time.  Psychiatric:        Speech: Speech normal.        Behavior: Behavior normal.        Thought Content: Thought content normal.        Judgment: Judgment normal.     Wt Readings from Last 3 Encounters:  11/18/23 190 lb (86.2 kg)  09/11/23 183 lb 9.6 oz (83.3 kg)  03/19/23 178 lb 9.2 oz (81 kg)     ASSESSMENT & PLAN:   Nonobstructive CAD: No further symptoms of chest pain.  LHC in 03/2022 showed nonobstructive disease as outlined above.  Myocardial PET/CT in 11/2023 showed no evidence of ischemia or infarction and was low risk.  Continue aggressive risk factor modification and primary prevention including aspirin 81 mg, Imdur 60 mg, rosuvastatin, and carvedilol in place of metoprolol as outlined below.  No indication for further ischemic testing at this time.  HTN: Blood pressure is mildly elevated in the office.  Discontinue Toprol-XL.  Start carvedilol 25 mg twice daily with continuation of Benicar 40 mg and Imdur 60 mg.  Aortic atherosclerosis/HLD/hypertriglyceridemia: LDL 54 triglyceride of 261 in 06/2023.  It is unclear if this was a fasting sample or  not.  Remains on rosuvastatin 10 mg.  Followed by PCP.  LBBB: Stable.  No symptoms of syncope.  Prior echo with preserved LV systolic function.  Normal LV systolic function on stress testing earlier this month.  Carotid artery disease: 65% stenosis in the proximal right ICA with 50% stenosis in the proximal left ICA noted on CTA head neck in 03/2023. At that time, there was also moderate stenosis in the proximal posterior inferior cerebral arteries bilaterally.  Will need follow-up imaging in the summer 2025.  Remains on aspirin and statin as outlined above.     Disposition: F/u with Dr. Kirke Corin or an APP in 3 months.   Medication Adjustments/Labs and Tests Ordered: Current medicines are reviewed at length with the patient today.  Concerns regarding medicines are outlined above. Medication changes, Labs and Tests ordered today are summarized above and listed in the Patient Instructions accessible in Encounters.   Signed, Eula Listen, PA-C 11/18/2023 4:33 PM     [facility] [place] [address] [place], [place] [address] [phone]

## 2023-11-26 DIAGNOSIS — E1136 Type 2 diabetes mellitus with diabetic cataract: Secondary | ICD-10-CM | POA: Diagnosis not present

## 2023-11-26 DIAGNOSIS — H2512 Age-related nuclear cataract, left eye: Secondary | ICD-10-CM | POA: Diagnosis not present

## 2023-11-26 DIAGNOSIS — H25811 Combined forms of age-related cataract, right eye: Secondary | ICD-10-CM | POA: Diagnosis not present

## 2023-11-26 DIAGNOSIS — H2511 Age-related nuclear cataract, right eye: Secondary | ICD-10-CM | POA: Diagnosis not present

## 2023-11-26 DIAGNOSIS — I10 Essential (primary) hypertension: Secondary | ICD-10-CM | POA: Diagnosis not present

## 2023-12-04 ENCOUNTER — Other Ambulatory Visit: Payer: Self-pay | Admitting: Physician Assistant

## 2023-12-16 DIAGNOSIS — E538 Deficiency of other specified B group vitamins: Secondary | ICD-10-CM | POA: Diagnosis not present

## 2023-12-16 DIAGNOSIS — E1140 Type 2 diabetes mellitus with diabetic neuropathy, unspecified: Secondary | ICD-10-CM | POA: Diagnosis not present

## 2023-12-17 DIAGNOSIS — H25812 Combined forms of age-related cataract, left eye: Secondary | ICD-10-CM | POA: Diagnosis not present

## 2023-12-17 DIAGNOSIS — I10 Essential (primary) hypertension: Secondary | ICD-10-CM | POA: Diagnosis not present

## 2023-12-17 DIAGNOSIS — H2512 Age-related nuclear cataract, left eye: Secondary | ICD-10-CM | POA: Diagnosis not present

## 2023-12-17 DIAGNOSIS — E1136 Type 2 diabetes mellitus with diabetic cataract: Secondary | ICD-10-CM | POA: Diagnosis not present

## 2023-12-23 DIAGNOSIS — I509 Heart failure, unspecified: Secondary | ICD-10-CM | POA: Diagnosis not present

## 2023-12-23 DIAGNOSIS — E1140 Type 2 diabetes mellitus with diabetic neuropathy, unspecified: Secondary | ICD-10-CM | POA: Diagnosis not present

## 2023-12-23 DIAGNOSIS — E538 Deficiency of other specified B group vitamins: Secondary | ICD-10-CM | POA: Diagnosis not present

## 2023-12-23 DIAGNOSIS — Z0000 Encounter for general adult medical examination without abnormal findings: Secondary | ICD-10-CM | POA: Diagnosis not present

## 2023-12-23 DIAGNOSIS — Z1331 Encounter for screening for depression: Secondary | ICD-10-CM | POA: Diagnosis not present

## 2023-12-23 DIAGNOSIS — F32A Depression, unspecified: Secondary | ICD-10-CM | POA: Diagnosis not present

## 2023-12-23 DIAGNOSIS — N8110 Cystocele, unspecified: Secondary | ICD-10-CM | POA: Diagnosis not present

## 2024-01-07 DIAGNOSIS — R32 Unspecified urinary incontinence: Secondary | ICD-10-CM | POA: Diagnosis not present

## 2024-01-07 DIAGNOSIS — N810 Urethrocele: Secondary | ICD-10-CM | POA: Diagnosis not present

## 2024-01-07 DIAGNOSIS — N816 Rectocele: Secondary | ICD-10-CM | POA: Diagnosis not present

## 2024-01-08 ENCOUNTER — Other Ambulatory Visit: Payer: Self-pay | Admitting: Obstetrics and Gynecology

## 2024-01-08 DIAGNOSIS — R32 Unspecified urinary incontinence: Secondary | ICD-10-CM

## 2024-01-08 DIAGNOSIS — N810 Urethrocele: Secondary | ICD-10-CM

## 2024-01-14 ENCOUNTER — Encounter: Payer: Self-pay | Admitting: Obstetrics and Gynecology

## 2024-01-21 DIAGNOSIS — E08621 Diabetes mellitus due to underlying condition with foot ulcer: Secondary | ICD-10-CM | POA: Diagnosis not present

## 2024-01-21 DIAGNOSIS — Z79891 Long term (current) use of opiate analgesic: Secondary | ICD-10-CM | POA: Diagnosis not present

## 2024-01-21 DIAGNOSIS — G894 Chronic pain syndrome: Secondary | ICD-10-CM | POA: Diagnosis not present

## 2024-01-21 DIAGNOSIS — E0842 Diabetes mellitus due to underlying condition with diabetic polyneuropathy: Secondary | ICD-10-CM | POA: Diagnosis not present

## 2024-01-21 DIAGNOSIS — I10 Essential (primary) hypertension: Secondary | ICD-10-CM | POA: Diagnosis not present

## 2024-01-21 DIAGNOSIS — M79671 Pain in right foot: Secondary | ICD-10-CM | POA: Diagnosis not present

## 2024-01-21 DIAGNOSIS — F1120 Opioid dependence, uncomplicated: Secondary | ICD-10-CM | POA: Diagnosis not present

## 2024-01-22 DIAGNOSIS — Z79891 Long term (current) use of opiate analgesic: Secondary | ICD-10-CM | POA: Diagnosis not present

## 2024-01-22 DIAGNOSIS — F1120 Opioid dependence, uncomplicated: Secondary | ICD-10-CM | POA: Diagnosis not present

## 2024-01-29 DIAGNOSIS — Z0100 Encounter for examination of eyes and vision without abnormal findings: Secondary | ICD-10-CM | POA: Diagnosis not present

## 2024-01-30 DIAGNOSIS — M7989 Other specified soft tissue disorders: Secondary | ICD-10-CM | POA: Diagnosis not present

## 2024-01-30 DIAGNOSIS — E1140 Type 2 diabetes mellitus with diabetic neuropathy, unspecified: Secondary | ICD-10-CM | POA: Diagnosis not present

## 2024-02-13 DIAGNOSIS — R600 Localized edema: Secondary | ICD-10-CM | POA: Diagnosis not present

## 2024-02-16 ENCOUNTER — Ambulatory Visit: Payer: Medicare HMO | Attending: Physician Assistant | Admitting: Physician Assistant

## 2024-02-16 ENCOUNTER — Encounter: Payer: Self-pay | Admitting: Physician Assistant

## 2024-02-16 ENCOUNTER — Other Ambulatory Visit: Payer: Self-pay | Admitting: Emergency Medicine

## 2024-02-16 DIAGNOSIS — I2510 Atherosclerotic heart disease of native coronary artery without angina pectoris: Secondary | ICD-10-CM | POA: Diagnosis not present

## 2024-02-16 DIAGNOSIS — R0602 Shortness of breath: Secondary | ICD-10-CM | POA: Diagnosis not present

## 2024-02-16 DIAGNOSIS — E781 Pure hyperglyceridemia: Secondary | ICD-10-CM | POA: Diagnosis not present

## 2024-02-16 DIAGNOSIS — I6523 Occlusion and stenosis of bilateral carotid arteries: Secondary | ICD-10-CM

## 2024-02-16 DIAGNOSIS — Z79899 Other long term (current) drug therapy: Secondary | ICD-10-CM

## 2024-02-16 DIAGNOSIS — E785 Hyperlipidemia, unspecified: Secondary | ICD-10-CM | POA: Diagnosis not present

## 2024-02-16 DIAGNOSIS — I447 Left bundle-branch block, unspecified: Secondary | ICD-10-CM

## 2024-02-16 DIAGNOSIS — I700 Atherosclerosis of aorta: Secondary | ICD-10-CM | POA: Diagnosis not present

## 2024-02-16 DIAGNOSIS — I10 Essential (primary) hypertension: Secondary | ICD-10-CM

## 2024-02-16 DIAGNOSIS — M7989 Other specified soft tissue disorders: Secondary | ICD-10-CM

## 2024-02-16 NOTE — Patient Instructions (Signed)
 Medication Instructions:  Your Physician recommend you continue on your current medication as directed.    *If you need a refill on your cardiac medications before your next appointment, please call your pharmacy*  Lab Work: Your provider would like for you to have following labs drawn today CBC, BMet, BNP.   If you have labs (blood work) drawn today and your tests are completely normal, you will receive your results only by: MyChart Message (if you have MyChart) OR A paper copy in the mail If you have any lab test that is abnormal or we need to change your treatment, we will call you to review the results.  Testing/Procedures: Your physician has requested that you have an abdominal aorta duplex of the Right Lower Extremity. During this test, an ultrasound is used to evaluate the aorta. Allow 30 minutes for this exam. Do not eat after midnight the day before and avoid carbonated beverages. This will take place at [address] [facility] [phone], [place] [address]   Your physician has requested that you have an echocardiogram. Echocardiography is a painless test that uses sound waves to create images of your heart. It provides your doctor with information about the size and shape of your heart and how well your heart's chambers and valves are working.   You may receive an ultrasound enhancing agent through an IV if needed to better visualize your heart during the echo. This procedure takes approximately one hour.  There are no restrictions for this procedure.  This will take place at [address] [facility]) [address], [place] [address]  Please note: We ask at that you not bring children with you during ultrasound (echo/ vascular) testing. Due to room size and safety concerns, children are not allowed in the ultrasound rooms during exams. Our front office staff cannot provide observation of children in our lobby area while testing is being conducted. An adult  accompanying a patient to their appointment will only be allowed in the ultrasound room at the discretion of the ultrasound technician under special circumstances. We apologize for any inconvenience.   Follow-Up: At [facility], you and your health needs are our priority.  As part of our continuing mission to provide you with exceptional heart care, our providers are all part of one team.  This team includes your primary Cardiologist (physician) and Advanced Practice Providers or APPs (Physician Assistants and Nurse Practitioners) who all work together to provide you with the care you need, when you need it.  Your next appointment:   2 month(s)  Provider:   You may see Antionette Kirks, MD or one of the following Advanced Practice Providers on your designated Care Team:   Laneta Pintos, NP Gildardo Labrador, PA-C Varney Gentleman, PA-C Cadence Evart, PA-C Ronald Cockayne, NP Morey Ar, NP    We recommend signing up for the patient portal called "MyChart".  Sign up information is provided on this After Visit Summary.  MyChart is used to connect with patients for Virtual Visits (Telemedicine).  Patients are able to view lab/test results, encounter notes, upcoming appointments, etc.  Non-urgent messages can be sent to your provider as well.   To learn more about what you can do with MyChart, go to [email or web address].

## 2024-02-16 NOTE — Progress Notes (Signed)
 Cardiology Office Note    Date:  02/16/2024   ID:  [name], DOB [birth year], MRN [id number]  PCP:  Sari Cunning, MD  Cardiologist:  Antionette Kirks, MD  Electrophysiologist:  None   Chief Complaint: Follow up  History of Present Illness:   [name] is a 78 y.o. female with history of nonobstructive CAD by LHC in 03/2022 with chronic angina, LBBB, aortic atherosclerosis, DM2, carotid artery disease, cerebrovascular disease, HTN, HLD, and hypertriglyceridemia who presents for follow-up of HTN.   Prior LHC in 2008 showed minor irregularities.  She was admitted to the hospital in 09/2020 with atypical chest pain.  Troponin was normal.  Lexiscan  MPI in 09/2020 showed no significant ischemia with an EF of 62%.  CT attenuated corrected images showed coronary artery calcification in the LAD and very mild aortic atherosclerosis.  Overall, this was a low risk scan.  Echo in 09/2020 demonstrated an EF of 55%, wall motion abnormality felt to be secondary bundle branch block, grade 1 diastolic dysfunction, mild LVH, normal RV systolic function and ventricular cavity size, and mild mitral regurgitation.  She was seen in 03/2022 and noted a 2 to 34-month history of change in her longstanding angina with noted exertional dyspnea, chest pain, and extreme fatigue.  Chest pain was nitro responsive and would improve with rest.  Given concerning symptoms, she underwent LHC on 03/22/2022 that showed mild nonobstructive CAD with 20% ostial to proximal LCx stenosis, 20% proximal RCA stenosis, and 30% mid RCA stenosis.  LVEF 55 to 65%.  Normal LVEDP.  Medical therapy was recommended.  Echo on 05/01/2022 demonstrated an EF of 65 to 70%, moderate LVH, grade 1 diastolic dysfunction, turbulent flow in the LVOT suggestive of possible LVOT obstruction, though significant gradient was not demonstrated by pulsed wave Doppler, normal RV systolic function and ventricular cavity size, trivial mitral regurgitation, and an  estimated right atrial pressure of 3 mmHg.  She was seen in the office in 07/2022 and reported resolution of chest discomfort following cardiac cath.  She was seen in the ED in 03/2023 with dizziness felt to be related to vertigo.  Troponin was checked at that time and normal.  EKG showed sinus rhythm with known left bundle branch block.  CTA head/neck showed no acute intracranial process with 65% stenosis in the proximal right ICA and 50% stenosis in the proximal left ICA as well as moderate stenosis in the proximal posterior inferior cerebellar arteries bilaterally.  She underwent myocardial PET/CT in 11/2023 in the setting of intermittent chest tightness that showed no evidence of ischemia or infarction and was overall low risk.  Coronary artery calcification was noted in the LAD and RCA distributions.  She was last seen in the office in 11/2023 and was without further chest pain.  Blood pressure been running on the higher side with rare readings into the 170s to 190s mmHg systolic leading to Toprol  to be discontinued and carvedilol  25 mg twice daily initiated with continuation of Benicar 40 mg and Imdur  60 mg.  She was seen by PCP's office in 01/2024 and 02/2024 with lower extremity swelling and treated for left lower extremity cellulitis with Keflex and advised to take furosemide .  She comes in and is without symptoms of angina or cardiac decompensation.  She notes an increase in right lower extremity swelling with associated erythema and warmth as well as some mild left lower extremity swelling that began approximately 5 to 6 weeks ago.  In this setting she  self increased furosemide  to 20 mg twice daily and has maintained this since.  After starting Keflex 3 days ago the erythema on the right lower extremity is improving, though does persist.  No recent prolonged sedentary time frames, vascular trauma, or hypercoagulable state.  Wells score 0.  In this setting she does note some abdominal distention, though  reports a history of varying IBS-C and IBS-D with lengthy history of abdominal distention and diminished appetite at times.  Her weight is down approximately 3 pounds when compared to her last clinic visit.  No progressive orthopnea.  Blood pressure well-controlled at home.   Labs independently reviewed: 12/2023 - Hgb 11.8, PLT 176, potassium 4.4, BUN 25, serum creatinine 0.9, albumin 4.2, AST/ALT normal, A1c 9.4, TC 149, TG 395, HDL 35, LDL 35, TSH normal  Past Medical History:  Diagnosis Date   Chest pain    a. reports multiple nl stress tests and nl cath in 2008.   Diabetes mellitus without complication (HCC)    History of echocardiogram    a. 07/2013 Echo: EF 50-55%. No rwma. Gr1 DD. Abnl septal motion consistent w/ LBBB. Mild MR/TR.   Hyperlipidemia    Hypertension    LBBB (left bundle branch block)     Past Surgical History:  Procedure Laterality Date   endovascular laser N/A    LEFT HEART CATH AND CORONARY ANGIOGRAPHY N/A 03/22/2022   Procedure: LEFT HEART CATH AND CORONARY ANGIOGRAPHY;  Surgeon: Wenona Hamilton, MD;  Location: [facility] INVASIVE CV LAB;  Service: Cardiovascular;  Laterality: N/A;    Current Medications: Current Meds  Medication Sig   aspirin  EC 81 MG tablet Take 81 mg by mouth daily. Swallow whole.   carvedilol  (COREG ) 25 MG tablet Take 1 tablet (25 mg total) by mouth 2 (two) times daily.   Cholecalciferol  (VITAMIN D3) 25 MCG (1000 UT) CAPS Take 1,000 Units by mouth daily.   cyanocobalamin  (,VITAMIN B-12,) 1000 MCG/ML injection Inject into the muscle.   diclofenac Sodium (VOLTAREN) 1 % GEL Apply 2 g topically in the morning, at noon, in the evening, and at bedtime.   estradiol (ESTRACE) 0.1 MG/GM vaginal cream Place vaginally.   etodolac (LODINE) 400 MG tablet Take 400 mg by mouth daily.   furosemide  (LASIX ) 20 MG tablet Take 20 mg by mouth 2 (two) times daily.   gabapentin  (NEURONTIN ) 300 MG capsule Take 300 mg by mouth 3 (three) times daily.    glimepiride  (AMARYL) 4 MG tablet Take 4 mg by mouth daily with breakfast.   isosorbide  mononitrate (IMDUR ) 60 MG 24 hr tablet TAKE 1 TABLET BY MOUTH DAILY   metFORMIN (GLUCOPHAGE-XR) 500 MG 24 hr tablet Take 1,000 mg by mouth every evening.   olmesartan (BENICAR) 40 MG tablet Take 40 mg by mouth daily.   omeprazole (PRILOSEC) 20 MG capsule Take 20 mg by mouth daily.   oxyCODONE  (OXY IR/ROXICODONE ) 5 MG immediate release tablet Take 5 mg by mouth every 8 (eight) hours as needed.   OZEMPIC, 0.25 OR 0.5 MG/DOSE, 2 MG/3ML SOPN as directed.   rosuvastatin  (CRESTOR ) 10 MG tablet Take 10 mg by mouth daily.   sertraline (ZOLOFT) 50 MG tablet Take 25 mg by mouth daily.   triamcinolone  cream (KENALOG ) 0.1 % Apply 1 application topically 2 (two) times daily.    Allergies:   Patient has no known allergies.   Social History   Socioeconomic History   Marital status: Married    Spouse name: Not on file   Number of  children: Not on file   Years of education: Not on file   Highest education level: Not on file  Occupational History   Not on file  Tobacco Use   Smoking status: Former    Current packs/day: 0.00    Average packs/day: 0.5 packs/day for 40.0 years (20.0 ttl pk-yrs)    Types: Cigarettes    Start date: 10/07/1978    Quit date: 10/07/2018    Years since quitting: 5.3   Smokeless tobacco: Never  Vaping Use   Vaping status: Never Used  Substance and Sexual Activity   Alcohol use: No   Drug use: No   Sexual activity: Not on file  Other Topics Concern   Not on file  Social History Narrative   Lives outside of [place] w/ husband.  Walks 1/3 mile/day.   Social Drivers of Corporate investment banker Strain: Low Risk  (01/07/2024)   Received from [facility]   Overall Financial Resource Strain (CARDIA)    Difficulty of Paying Living Expenses: Not hard at all  Food Insecurity: No Food Insecurity (01/07/2024)   Received from [facility]   Hunger Vital Sign    Worried  About Running Out of Food in the Last Year: Never true    Ran Out of Food in the Last Year: Never true  Transportation Needs: No Transportation Needs (01/07/2024)   Received from [facility] - Transportation    In the past 12 months, has lack of transportation kept you from medical appointments or from getting medications?: No    Lack of Transportation (Non-Medical): No  Physical Activity: Inactive (06/30/2023)   Received from [facility]   Exercise Vital Sign    Days of Exercise per Week: 0 days    Minutes of Exercise per Session: 0 min  Stress: No Stress Concern Present (06/30/2023)   Received from [facility] - Occupational Stress Questionnaire    Feeling of Stress : Only a little  Social Connections: Moderately Isolated (06/30/2023)   Received from [facility]   Social Connection and Isolation Panel [NHANES]    Frequency of Communication with Friends and Family: More than three times a week    Frequency of Social Gatherings with Friends and Family: More than three times a week    Attends Religious Services: More than 4 times per year    Active Member of [employer] or Organizations: No    Attends Banker Meetings: Never    Marital Status: Widowed     Family History:  The patient's family history includes Breast cancer in her maternal aunt; Celiac disease in her brother; Dementia in her mother; Heart attack in her father; Liver cancer in her sister.  ROS:   12-point review of systems is negative unless otherwise noted in the HPI.   EKGs/Labs/Other Studies Reviewed:    Studies reviewed were summarized above. The additional studies were reviewed today:  Myocardial PET/CT 11/11/2023:   The study is normal. The study is low risk.   LV perfusion is normal. There is no evidence of ischemia. There is no evidence of infarction.   Rest left ventricular  function is normal. Stress left ventricular function is normal. End diastolic cavity size is normal. End systolic cavity size is normal.   Myocardial blood flow was computed to be 0.45ml/g/min at rest and 1.32ml/g/min at stress. Global myocardial blood flow reserve  was 2.00 and was normal.   Coronary calcium  was present on the attenuation correction CT images. Moderate coronary calcifications were present. Coronary calcifications were present in the left anterior descending artery and right coronary artery distribution(s). __________   2D echo 05/01/2022: 1. Left ventricular ejection fraction, by estimation, is 65 to 70%. The  left ventricle has normal function. Left ventricular endocardial border  not optimally defined to evaluate regional wall motion. There is moderate  left ventricular hypertrophy. Left  ventricular diastolic parameters are consistent with Grade I diastolic  dysfunction (impaired relaxation). There is turbulent flow in the left  ventricular outflow tract suggestive of possible LVOT obstruction though  significant gradient is not  demonstrated by pulsed wave Doppler.   2. Right ventricular systolic function is normal. The right ventricular  size is normal. Tricuspid regurgitation signal is inadequate for assessing  PA pressure.   3. The mitral valve is normal in structure. Trivial mitral valve  regurgitation.   4. The aortic valve is tricuspid. Aortic valve regurgitation is not  visualized. No aortic stenosis is present.   5. The inferior vena cava is normal in size with greater than 50%  respiratory variability, suggesting right atrial pressure of 3 mmHg.   Comparison(s): EF 55%, mild LVH. __________   [facility] 03/22/2022:   Prox RCA lesion is 20% stenosed.   Mid RCA lesion is 30% stenosed.   Ost Cx to Prox Cx lesion is 20% stenosed.   The left ventricular systolic function is normal.   LV end diastolic pressure is normal.   The left ventricular ejection fraction is  55-65% by visual estimate.   1.  Mild nonobstructive coronary artery disease. 2.  Normal LV systolic function and normal left ventricular end-diastolic pressure.   Recommendations: No culprit is identified for unstable angina.  Continue medical therapy. __________   Lexiscan  MPI 09/15/2020: Pharmacological myocardial perfusion imaging study with no significant  Ischemia Small mild defect in the apical region, likely attenuation artifact  Normal wall motion, EF estimated at 62% No EKG changes concerning for ischemia at peak stress or in recovery. CT attenuation correction images with coronary calcification in the LAD, very mild in the aorta Low risk scan __________   2D echo 09/15/2020: 1. Left ventricular ejection fraction, by estimation, is 55%. The left  ventricle has normal function. Anteroseptal wall motion abnormality,  likely secondary to conduction abnormailty, bundle branch block. There is  mild left ventricular hypertrophy. Left   ventricular diastolic parameters are consistent with Grade I diastolic  dysfunction (impaired relaxation).   2. Right ventricular systolic function is normal. The right ventricular  size is normal.   3. The mitral valve is normal in structure. Mild mitral valve  regurgitation.   4. Challenging images, definity  used __________   2D echo 07/27/2013: - Left ventricle: The cavity size was normal. There was mild    concentric hypertrophy. Systolic function was normal. The    estimated ejection fraction was in the range of 50% to    55%. Wall motion was normal; there were no regional wall    motion abnormalities. Doppler parameters are consistent    with abnormal left ventricular relaxation (grade 1    diastolic dysfunction).  - Ventricular septum: Septal motion showed abnormal    function. These changes are consistent with a left bundle    branch block.  - Mitral valve: Mild regurgitation.  - Tricuspid valve: Mild regurgitation.  - Pulmonary  arteries: Systolic pressure was within the  normal range.   EKG:  EKG is not ordered today.    Recent Labs: 03/19/2023: BUN 22; Creatinine, Ser 0.93; Hemoglobin 13.1; Platelets 190; Potassium 4.1; Sodium 139  Recent Lipid Panel    Component Value Date/Time   CHOL 122 09/14/2020 1738   TRIG 151 (H) 09/14/2020 1738   HDL 49 09/14/2020 1738   CHOLHDL 2.5 09/14/2020 1738   VLDL 30 09/14/2020 1738   LDLCALC 43 09/14/2020 1738    PHYSICAL EXAM:    VS:  BP (!) 104/50 (BP Location: Left Arm, Patient Position: Sitting, Cuff Size: Normal)   Pulse 72   Ht 5\' 10"  (1.778 m)   Wt 187 lb 6.4 oz (85 kg)   SpO2 95%   BMI 26.89 kg/m   BMI: Body mass index is 26.89 kg/m.  Physical Exam Constitutional:      Appearance: She is well-developed.  HENT:     Head: Normocephalic and atraumatic.  Eyes:     General:        Right eye: No discharge.        Left eye: No discharge.  Cardiovascular:     Rate and Rhythm: Normal rate and regular rhythm.     Heart sounds: Normal heart sounds, S1 normal and S2 normal. Heart sounds not distant. No midsystolic click and no opening snap. No murmur heard.    No friction rub.  Pulmonary:     Effort: Pulmonary effort is normal. No respiratory distress.     Breath sounds: Normal breath sounds. No decreased breath sounds, wheezing, rhonchi or rales.  Chest:     Chest wall: No tenderness.  Musculoskeletal:     Cervical back: Normal range of motion.     Right lower leg: Edema present.     Left lower leg: Edema present.     Comments: Right greater than left lower extremity swelling with mild pitting edema to the midshin and mild erythema of the right lower extremity just superior to the ankle.  Trivial pretibial pitting edema of the left lower extremity.  Skin:    General: Skin is warm and dry.     Nails: There is no clubbing.  Neurological:     Mental Status: She is alert and oriented to person, place, and time.  Psychiatric:        Speech: Speech  normal.        Behavior: Behavior normal.        Thought Content: Thought content normal.        Judgment: Judgment normal.     Wt Readings from Last 3 Encounters:  02/16/24 187 lb 6.4 oz (85 kg)  11/18/23 190 lb (86.2 kg)  09/11/23 183 lb 9.6 oz (83.3 kg)     ASSESSMENT & PLAN:   Right lower extremity swelling and erythema: Erythema improving with addition of Keflex which will be managed by PCP.  Schedule lower extremity venous duplex to rule out DVT.  She has been maintained on furosemide  20 mg twice daily for approximately the past 5 weeks.  Check BNP, CBC, and BMP.  Obtain echo to evaluate for new cardiomyopathy.  If venous duplex is unrevealing, echo is without significant structural abnormality, and lab evaluation is unrevealing would recommend ongoing management per PCP for cellulitis as well as further evaluation for possible compressive process if unilateral swelling persists.  Nonobstructive CAD: No symptoms suggestive of angina.  LHC in 03/2022 showed nonobstructive disease as outlined above.  Myocardial PET/CT in 11/2023 showed no evidence of ischemia  or infarction and was low risk.  Continue aggressive risk factor modification and primary prevention including aspirin  81 mg, carvedilol  25 mg twice daily, Imdur  60 mg, and rosuvastatin  10 mg.  No indication for further ischemic testing at this time.  HTN: Blood pressure is well-controlled in the office, and is slightly low.  This is possibly in the setting of some dehydration following titration of furosemide .  Obtain labs as outlined above.  For now, continue carvedilol  25 mg twice daily, Imdur  60 mg, and olmesartan 40 mg.  Aortic atherosclerosis/HLD/hypertriglyceridemia: LDL 35, triglyceride 395 in 12/2023.  Remains on rosuvastatin  10 mg.  Would benefit from addition of fibrate or Vascepa in follow-up given elevated triglycerides.  LBBB: No symptoms suggestive of syncope.  Prior echo with preserved LV systolic function and normal LV  systolic function with stress testing in 11/2023.  Carotid artery disease: 65% stenosis in the proximal right ICA with 50% stenosis in the proximal left ICA noted on CTA head neck in 03/2023. At that time, there was also moderate stenosis in the proximal posterior inferior cerebral arteries bilaterally.  Will need follow-up imaging in the summer 2025.  Remains on aspirin  and statin as outlined above.  DM2: A1c 9.4 in 12/2023.    Disposition: F/u with Dr. Alvenia Aus or an APP in 2 months.   Medication Adjustments/Labs and Tests Ordered: Current medicines are reviewed at length with the patient today.  Concerns regarding medicines are outlined above. Medication changes, Labs and Tests ordered today are summarized above and listed in the Patient Instructions accessible in Encounters.   Signed, Varney Gentleman, PA-C 02/16/2024 4:50 PM     [facility] [place] [address] [place], [place] [address] [phone]

## 2024-02-17 ENCOUNTER — Ambulatory Visit: Payer: Self-pay | Admitting: Physician Assistant

## 2024-02-17 ENCOUNTER — Ambulatory Visit
Admission: RE | Admit: 2024-02-17 | Discharge: 2024-02-17 | Disposition: A | Discharge status: Home / Self Care | Source: Ambulatory Visit | Attending: Physician Assistant | Admitting: Physician Assistant

## 2024-02-17 ENCOUNTER — Other Ambulatory Visit: Payer: Self-pay | Admitting: Emergency Medicine

## 2024-02-17 DIAGNOSIS — M7989 Other specified soft tissue disorders: Secondary | ICD-10-CM | POA: Insufficient documentation

## 2024-02-17 DIAGNOSIS — Z79899 Other long term (current) drug therapy: Secondary | ICD-10-CM

## 2024-02-17 LAB — CBC
Hematocrit: 38.7 % (ref 34.0–46.6)
Hemoglobin: 12.6 g/dL (ref 11.1–15.9)
MCH: 30.1 pg (ref 26.6–33.0)
MCHC: 32.6 g/dL (ref 31.5–35.7)
MCV: 92 fL (ref 79–97)
Platelets: 208 10*3/uL (ref 150–450)
RBC: 4.19 x10E6/uL (ref 3.77–5.28)
RDW: 12.8 % (ref 11.7–15.4)
WBC: 5.7 10*3/uL (ref 3.4–10.8)

## 2024-02-17 LAB — BASIC METABOLIC PANEL WITH GFR
BUN/Creatinine Ratio: 24 (ref 12–28)
BUN: 31 mg/dL — ABNORMAL HIGH (ref 8–27)
CO2: 23 mmol/L (ref 20–29)
Calcium: 9.9 mg/dL (ref 8.7–10.3)
Chloride: 101 mmol/L (ref 96–106)
Creatinine, Ser: 1.28 mg/dL — ABNORMAL HIGH (ref 0.57–1.00)
Glucose: 134 mg/dL — ABNORMAL HIGH (ref 70–99)
Potassium: 4.6 mmol/L (ref 3.5–5.2)
Sodium: 140 mmol/L (ref 134–144)
eGFR: 42 mL/min/{1.73_m2} — ABNORMAL LOW (ref >59)

## 2024-02-17 LAB — BRAIN NATRIURETIC PEPTIDE: BNP: 13.7 pg/mL (ref 0.0–100.0)

## 2024-02-17 MED ORDER — FUROSEMIDE 20 MG PO TABS: 20.0000 mg | ORAL_TABLET | Freq: Every day | ORAL | 3 refills | Status: DC

## 2024-02-26 ENCOUNTER — Ambulatory Visit: Attending: Physician Assistant

## 2024-02-26 DIAGNOSIS — R0602 Shortness of breath: Secondary | ICD-10-CM | POA: Diagnosis not present

## 2024-02-26 LAB — ECHOCARDIOGRAM COMPLETE
AR max vel: 1.82 cm2
AV Area VTI: 1.84 cm2
AV Area mean vel: 1.83 cm2
AV Mean grad: 6 mmHg
AV Peak grad: 10.5 mmHg
Ao pk vel: 1.62 m/s
Area-P 1/2: 3.27 cm2
Calc EF: 62.7 %
MV VTI: 2.86 cm2
S' Lateral: 2.7 cm
Single Plane A2C EF: 65 %
Single Plane A4C EF: 61.4 %

## 2024-03-09 DIAGNOSIS — R600 Localized edema: Secondary | ICD-10-CM | POA: Diagnosis not present

## 2024-03-09 DIAGNOSIS — I429 Cardiomyopathy, unspecified: Secondary | ICD-10-CM | POA: Diagnosis not present

## 2024-03-22 ENCOUNTER — Ambulatory Visit (INDEPENDENT_AMBULATORY_CARE_PROVIDER_SITE_OTHER): Admitting: Urology

## 2024-03-22 VITALS — BP 111/57 | HR 84

## 2024-03-22 DIAGNOSIS — R32 Unspecified urinary incontinence: Secondary | ICD-10-CM | POA: Diagnosis not present

## 2024-03-22 LAB — MICROSCOPIC EXAMINATION: Epithelial Cells (non renal): >10 /HPF — AB (ref 0–10)

## 2024-03-22 LAB — URINALYSIS, COMPLETE
Bilirubin, UA: NEGATIVE
Glucose, UA: NEGATIVE
Leukocytes,UA: NEGATIVE
Nitrite, UA: NEGATIVE
RBC, UA: NEGATIVE
Specific Gravity, UA: 1.03 (ref 1.005–1.030)
Urobilinogen, Ur: 0.2 mg/dL (ref 0.2–1.0)
pH, UA: 5.5 (ref 5.0–7.5)

## 2024-03-22 NOTE — Addendum Note (Signed)
 Addended byKatina Parlor on: 03/22/2024 02:10 PM   Modules accepted: Orders

## 2024-03-22 NOTE — Patient Instructions (Signed)

## 2024-03-22 NOTE — Progress Notes (Signed)
 03/22/2024 1:23 PM   [name] 01-Oct-1946 [id number]  Referring provider: Sari Cunning, MD [phone] [facility] [address] [facility] [place],  [place] [address]  No chief complaint on file.   HPI: I was consulted to assess the patient as urinary incontinence.  She leaks with bending lifting coughing sneezing.  She has urge incontinence.  She says the main symptom is when she leans and leaks.  She has varying moderately severe bedwetting.  She wears 4 pads a day moderately wet  Her flow is often slow.  She will feel empty.  She can stand up and gush.  She can hesitated at times.  Sometimes she strains to start urination  She has not had a hysterectomy.  She is on Ozempic.  No history of kidney stones bladder surgery or recurrent bladder infections.  Prone to alternating diarrhea and constipation.  No treatment   PMH: Past Medical History:  Diagnosis Date   Chest pain    a. reports multiple nl stress tests and nl cath in 2008.   Diabetes mellitus without complication (HCC)    History of echocardiogram    a. 07/2013 Echo: EF 50-55%. No rwma. Gr1 DD. Abnl septal motion consistent w/ LBBB. Mild MR/TR.   Hyperlipidemia    Hypertension    LBBB (left bundle branch block)     Surgical History: Past Surgical History:  Procedure Laterality Date   endovascular laser N/A    LEFT HEART CATH AND CORONARY ANGIOGRAPHY N/A 03/22/2022   Procedure: LEFT HEART CATH AND CORONARY ANGIOGRAPHY;  Surgeon: Wenona Hamilton, MD;  Location: [facility] INVASIVE CV LAB;  Service: Cardiovascular;  Laterality: N/A;    Home Medications:  Allergies as of 03/22/2024   No Known Allergies      Medication List        Accurate as of March 22, 2024  1:23 PM. If you have any questions, ask your nurse or doctor.          aspirin  EC 81 MG tablet Take 81 mg by mouth daily. Swallow whole.   carvedilol  25 MG tablet Commonly known as: COREG  Take 1 tablet (25 mg total) by mouth 2 (two)  times daily.   cyanocobalamin  1000 MCG/ML injection Commonly known as: VITAMIN B12 Inject into the muscle.   diclofenac Sodium 1 % Gel Commonly known as: VOLTAREN Apply 2 g topically in the morning, at noon, in the evening, and at bedtime.   estradiol 0.1 MG/GM vaginal cream Commonly known as: ESTRACE Place vaginally.   etodolac 400 MG tablet Commonly known as: LODINE Take 400 mg by mouth daily.   furosemide  20 MG tablet Commonly known as: LASIX  Take 1 tablet (20 mg total) by mouth daily.   gabapentin  300 MG capsule Commonly known as: NEURONTIN  Take 300 mg by mouth 3 (three) times daily.   glimepiride 4 MG tablet Commonly known as: AMARYL Take 4 mg by mouth daily with breakfast.   isosorbide  mononitrate 60 MG 24 hr tablet Commonly known as: IMDUR  TAKE 1 TABLET BY MOUTH DAILY   metFORMIN 500 MG 24 hr tablet Commonly known as: GLUCOPHAGE-XR Take 1,000 mg by mouth every evening.   nitroGLYCERIN  0.4 MG SL tablet Commonly known as: Nitrostat  Place 1 tablet (0.4 mg total) under the tongue every 5 (five) minutes as needed for chest pain (no more than 3 tablets in a 15 minute period).   olmesartan 40 MG tablet Commonly known as: BENICAR Take 40 mg by mouth daily.  omeprazole 20 MG capsule Commonly known as: PRILOSEC Take 20 mg by mouth daily.   oxyCODONE  5 MG immediate release tablet Commonly known as: Oxy IR/ROXICODONE  Take 5 mg by mouth every 8 (eight) hours as needed.   Ozempic (0.25 or 0.5 MG/DOSE) 2 MG/3ML Sopn Generic drug: Semaglutide(0.25 or 0.5MG /DOS) as directed.   rosuvastatin  10 MG tablet Commonly known as: CRESTOR  Take 10 mg by mouth daily.   sertraline 50 MG tablet Commonly known as: ZOLOFT Take 25 mg by mouth daily.   triamcinolone  cream 0.1 % Commonly known as: KENALOG  Apply 1 application topically 2 (two) times daily.   Vitamin D3 25 MCG (1000 UT) Caps Take 1,000 Units by mouth daily.        Allergies: No Known Allergies  Family  History: Family History  Problem Relation Age of Onset   Liver cancer Sister        died @ 67   Breast cancer Maternal Aunt    Dementia Mother        died @ 37   Heart attack Father        died @ 3   Celiac disease Brother        alive @ 66    Social History:  reports that she quit smoking about 5 years ago. Her smoking use included cigarettes. She started smoking about 45 years ago. She has a 20 pack-year smoking history. She has never used smokeless tobacco. She reports that she does not drink alcohol and does not use drugs.  ROS:                                        Physical Exam: There were no vitals taken for this visit.  Constitutional:  Alert and oriented, No acute distress. HEENT: [place] AT, moist mucus membranes.  Trachea midline, no masses. Cardiovascular: No clubbing, cyanosis, or edema. Respiratory: Normal respiratory effort, no increased work of breathing. GI: Abdomen is soft, nontender, nondistended, no abdominal masses GU: On pelvic examination she had mild hypermobility the bladder neck and negative cough test with modest cough.  She had a high grade 2 cystocele with mild central defect.  Uterus was reasonably well supported distended approximately 2 cm.  She had a moderately sized shiny grade 2 rectocele. Skin: No rashes, bruises or suspicious lesions. Lymph: No cervical or inguinal adenopathy. Neurologic: Grossly intact, no focal deficits, moving all 4 extremities. Psychiatric: Normal mood and affect.  Laboratory Data: Lab Results  Component Value Date   WBC 5.7 02/16/2024   HGB 12.6 02/16/2024   HCT 38.7 02/16/2024   MCV 92 02/16/2024   PLT 208 02/16/2024    Lab Results  Component Value Date   CREATININE 1.28 (H) 02/16/2024    No results found for: PSA  No results found for: TESTOSTERONE  Lab Results  Component Value Date   HGBA1C 8.4 (H) 09/14/2020    Urinalysis    Component Value Date/Time   COLORURINE STRAW  (A) 03/19/2023 1509   APPEARANCEUR CLEAR (A) 03/19/2023 1509   LABSPEC 1.006 03/19/2023 1509   PHURINE 5.0 03/19/2023 1509   GLUCOSEU NEGATIVE 03/19/2023 1509   HGBUR NEGATIVE 03/19/2023 1509   BILIRUBINUR NEGATIVE 03/19/2023 1509   KETONESUR NEGATIVE 03/19/2023 1509   PROTEINUR NEGATIVE 03/19/2023 1509   NITRITE NEGATIVE 03/19/2023 1509   LEUKOCYTESUR NEGATIVE 03/19/2023 1509    Pertinent Imaging: Urine reviewed and sent for  culture.  Chart reviewed  Assessment & Plan: Patient has mixed incontinence.  She also has bedwetting.  She has flow symptoms.  She will return for urodynamics and cystoscopy and proceed accordingly.  Picture was drawn.  Sometimes I think she does feel the rectocele.  Call if culture positive  Patient has strong smelling urine and this was discussed.  Her daughter is a Engineer, civil (consulting) and will drive her to [facility] for the test  1. Urinary incontinence, unspecified type (Primary)  - Urinalysis, Complete   No follow-ups on file.  Devorah Fonder, MD  [facility] [address], [address] [place], [place] [address] [phone]

## 2024-03-23 DIAGNOSIS — L03115 Cellulitis of right lower limb: Secondary | ICD-10-CM | POA: Diagnosis not present

## 2024-03-23 DIAGNOSIS — L03116 Cellulitis of left lower limb: Secondary | ICD-10-CM | POA: Diagnosis not present

## 2024-03-23 DIAGNOSIS — I872 Venous insufficiency (chronic) (peripheral): Secondary | ICD-10-CM | POA: Diagnosis not present

## 2024-03-24 DIAGNOSIS — E08621 Diabetes mellitus due to underlying condition with foot ulcer: Secondary | ICD-10-CM | POA: Diagnosis not present

## 2024-03-24 DIAGNOSIS — G894 Chronic pain syndrome: Secondary | ICD-10-CM | POA: Diagnosis not present

## 2024-03-24 DIAGNOSIS — L0390 Cellulitis, unspecified: Secondary | ICD-10-CM | POA: Diagnosis not present

## 2024-03-24 DIAGNOSIS — I10 Essential (primary) hypertension: Secondary | ICD-10-CM | POA: Diagnosis not present

## 2024-03-24 DIAGNOSIS — F1120 Opioid dependence, uncomplicated: Secondary | ICD-10-CM | POA: Diagnosis not present

## 2024-03-24 DIAGNOSIS — Z79891 Long term (current) use of opiate analgesic: Secondary | ICD-10-CM | POA: Diagnosis not present

## 2024-03-24 DIAGNOSIS — E0842 Diabetes mellitus due to underlying condition with diabetic polyneuropathy: Secondary | ICD-10-CM | POA: Diagnosis not present

## 2024-03-24 DIAGNOSIS — M79671 Pain in right foot: Secondary | ICD-10-CM | POA: Diagnosis not present

## 2024-04-19 ENCOUNTER — Encounter: Payer: Self-pay | Admitting: Physician Assistant

## 2024-04-19 ENCOUNTER — Ambulatory Visit: Attending: Physician Assistant | Admitting: Physician Assistant

## 2024-04-19 VITALS — BP 120/54 | HR 80 | Ht 70.0 in | Wt 188.2 lb

## 2024-04-19 DIAGNOSIS — E785 Hyperlipidemia, unspecified: Secondary | ICD-10-CM

## 2024-04-19 DIAGNOSIS — I10 Essential (primary) hypertension: Secondary | ICD-10-CM | POA: Diagnosis not present

## 2024-04-19 DIAGNOSIS — I2510 Atherosclerotic heart disease of native coronary artery without angina pectoris: Secondary | ICD-10-CM | POA: Diagnosis not present

## 2024-04-19 DIAGNOSIS — I700 Atherosclerosis of aorta: Secondary | ICD-10-CM

## 2024-04-19 DIAGNOSIS — I872 Venous insufficiency (chronic) (peripheral): Secondary | ICD-10-CM

## 2024-04-19 DIAGNOSIS — I447 Left bundle-branch block, unspecified: Secondary | ICD-10-CM

## 2024-04-19 DIAGNOSIS — I6523 Occlusion and stenosis of bilateral carotid arteries: Secondary | ICD-10-CM | POA: Diagnosis not present

## 2024-04-19 NOTE — Progress Notes (Signed)
 Cardiology Office Note    Date:  04/19/2024   ID:  ALEXXUS SOBH, DOB 1946/08/06, MRN [id number]  PCP:  Cleotilde Oneil FALCON, MD  Cardiologist:  Deatrice Cage, MD  Electrophysiologist:  None   Chief Complaint: Follow-up  History of Present Illness:   [name] is a 78 y.o. 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   Prior LHC in 2008 showed minor irregularities.  She was admitted to the hospital in 09/2020 with atypical chest pain.  Troponin was normal.  Lexiscan  MPI in 09/2020 showed no significant ischemia with an EF of 62%.  CT attenuated corrected images showed coronary artery calcification in the LAD and very mild aortic atherosclerosis.  Overall, this was a low risk scan.  Echo in 09/2020 demonstrated an EF of 55%, wall motion abnormality felt to be secondary bundle branch block, grade 1 diastolic dysfunction, mild LVH, normal RV systolic function and ventricular cavity size, and mild mitral regurgitation.  She was seen in 03/2022 and noted a 2 to 86-month history of change in her longstanding angina with noted exertional dyspnea, chest pain, and fatigue.  Chest pain was nitro responsive and would improve with rest.  Given concerning symptoms, she underwent LHC on 03/22/2022 that showed mild nonobstructive CAD with 20% ostial to proximal LCx stenosis, 20% proximal RCA stenosis, and 30% mid RCA stenosis.  LVEF 55 to 65%.  Normal LVEDP.  Medical therapy was recommended.  Echo on 05/01/2022 demonstrated an EF of 65 to 70%, moderate LVH, grade 1 diastolic dysfunction, turbulent flow in the LVOT suggestive of possible LVOT obstruction, though significant gradient was not demonstrated by pulsed wave Doppler, normal RV systolic function and ventricular cavity size, trivial mitral regurgitation, and an estimated right  atrial pressure of 3 mmHg.  She was seen in the office in 07/2022 and reported resolution of chest discomfort following cardiac cath.  She was seen in the ED in 03/2023 with dizziness felt to be related to vertigo.  Troponin was normal.  EKG showed sinus rhythm with known left bundle branch block.  CTA head/neck showed no acute intracranial process with 65% stenosis in the proximal right ICA and 50% stenosis in the proximal left ICA as well as moderate stenosis in the proximal posterior inferior cerebellar arteries bilaterally.  She underwent myocardial PET/CT in 11/2023 in the setting of intermittent chest tightness that showed no evidence of ischemia or infarction and was overall low risk.  Coronary artery calcification was noted in the LAD and RCA distributions.  She was seen in the office in 11/2023 and was without further chest pain.  Blood pressure been running on the higher side with rare readings into the 170s to 190s mmHg systolic leading to Toprol  to be discontinued and carvedilol  25 mg twice daily initiated with continuation of Benicar 40 mg and Imdur  60 mg.  She was seen by PCP's office in 01/2024 and 02/2024 with lower extremity swelling and treated for left lower extremity cellulitis with Keflex and advised to take furosemide .  She was last seen in the office in 02/2024 noting an increase in lower extremity swelling, right greater than left and had self increased furosemide  to 20 mg twice daily.  Erythema was improving following initiation of Keflex.  BNP normal at 13.7.  Lower extremity ultrasound negative for DVT.  Echo in 02/2024 showed an EF  of 55 to 60%, no regional wall motion abnormalities, mild LVH, grade 1 diastolic dysfunction, normal RV systolic function and ventricular cavity size, mild mitral regurgitation, and an estimated right atrial pressure of 3 mmHg.  She comes in and is doing well from a cardiac perspective, without symptoms of angina or cardiac decompensation.  Right lower extremity  swelling is improving following treatment for cellulitis, though swelling and mild erythema do persist.  She has compression socks at home as above, though has not been wearing them.  She does try to elevate her legs when sitting.  Currently taking torsemide 10 mg daily with good urine output.  No progressive orthopnea or weight gain.  No dizziness, presyncope, or syncope.  No falls or symptoms concerning for bleeding.  Also reports an increase in fatigue and was told by her husband that she snores.  No prior sleep study.  Declines sleep study.   Labs independently reviewed: 03/2024 - potassium 4.4, BUN 24, serum creatinine 1.1 02/2024 - Hgb 12.6, PLT 208 12/2023 - albumin 4.2, AST/ALT normal, A1c 9.4, TC 149, TG 395, HDL 35, LDL 35, TSH normal   Past Medical History:  Diagnosis Date   Chest pain    a. reports multiple nl stress tests and nl cath in 2008.   Diabetes mellitus without complication (HCC)    History of echocardiogram    a. 07/2013 Echo: EF 50-55%. No rwma. Gr1 DD. Abnl septal motion consistent w/ LBBB. Mild MR/TR.   Hyperlipidemia    Hypertension    LBBB (left bundle branch block)     Past Surgical History:  Procedure Laterality Date   endovascular laser N/A    LEFT HEART CATH AND CORONARY ANGIOGRAPHY N/A 03/22/2022   Procedure: LEFT HEART CATH AND CORONARY ANGIOGRAPHY;  Surgeon: Darron Deatrice LABOR, MD;  Location: [facility] INVASIVE CV LAB;  Service: Cardiovascular;  Laterality: N/A;    Current Medications: Current Meds  Medication Sig   aspirin  EC 81 MG tablet Take 81 mg by mouth daily. Swallow whole.   carvedilol  (COREG ) 25 MG tablet Take 1 tablet (25 mg total) by mouth 2 (two) times daily.   Cholecalciferol  (VITAMIN D3) 25 MCG (1000 UT) CAPS Take 1,000 Units by mouth daily.   cyanocobalamin  (,VITAMIN B-12,) 1000 MCG/ML injection Inject into the muscle.   diclofenac Sodium (VOLTAREN) 1 % GEL Apply 2 g topically in the morning, at noon, in the evening, and at bedtime.    etodolac (LODINE) 400 MG tablet Take 400 mg by mouth daily.   gabapentin  (NEURONTIN ) 300 MG capsule Take 300 mg by mouth 3 (three) times daily.    glimepiride (AMARYL) 4 MG tablet Take 4 mg by mouth daily with breakfast.   isosorbide  mononitrate (IMDUR ) 60 MG 24 hr tablet TAKE 1 TABLET BY MOUTH DAILY   metFORMIN (GLUCOPHAGE-XR) 500 MG 24 hr tablet Take 1,000 mg by mouth every evening.   nitroGLYCERIN  (NITROSTAT ) 0.4 MG SL tablet Place 1 tablet (0.4 mg total) under the tongue every 5 (five) minutes as needed for chest pain (no more than 3 tablets in a 15 minute period).   olmesartan (BENICAR) 40 MG tablet Take 40 mg by mouth daily.   omeprazole (PRILOSEC) 20 MG capsule Take 20 mg by mouth daily.   oxyCODONE  (OXY IR/ROXICODONE ) 5 MG immediate release tablet Take 5 mg by mouth every 8 (eight) hours as needed.   OZEMPIC, 0.25 OR 0.5 MG/DOSE, 2 MG/3ML SOPN as directed.   rosuvastatin  (CRESTOR ) 10 MG tablet Take 10 mg by  mouth daily.   sertraline (ZOLOFT) 50 MG tablet Take 25 mg by mouth daily.   torsemide (DEMADEX) 20 MG tablet Take 20 mg by mouth.   triamcinolone  cream (KENALOG ) 0.1 % Apply 1 application topically 2 (two) times daily.    Allergies:   Patient has no known allergies.   Social History   Socioeconomic History   Marital status: Married    Spouse name: Not on file   Number of children: Not on file   Years of education: Not on file   Highest education level: Not on file  Occupational History   Not on file  Tobacco Use   Smoking status: Former    Current packs/day: 0.00    Average packs/day: 0.5 packs/day for 40.0 years (20.0 ttl pk-yrs)    Types: Cigarettes    Start date: 10/07/1978    Quit date: 10/07/2018    Years since quitting: 5.5   Smokeless tobacco: Never  Vaping Use   Vaping status: Never Used  Substance and Sexual Activity   Alcohol use: No   Drug use: No   Sexual activity: Not on file  Other Topics Concern   Not on file  Social History Narrative   Lives  outside of [place] w/ husband.  Walks 1/3 mile/day.   Social Drivers of Corporate investment banker Strain: Low Risk  (01/07/2024)   Received from [facility]   Overall Financial Resource Strain (CARDIA)    Difficulty of Paying Living Expenses: Not hard at all  Food Insecurity: No Food Insecurity (01/07/2024)   Received from [facility]   Hunger Vital Sign    Within the past 12 months, you worried that your food would run out before you got the money to buy more.: Never true    Within the past 12 months, the food you bought just didn't last and you didn't have money to get more.: Never true  Transportation Needs: No Transportation Needs (01/07/2024)   Received from [facility] - Transportation    In the past 12 months, has lack of transportation kept you from medical appointments or from getting medications?: No    Lack of Transportation (Non-Medical): No  Physical Activity: Inactive (06/30/2023)   Received from [facility]   Exercise Vital Sign    On average, how many days per week do you engage in moderate to strenuous exercise (like a brisk walk)?: 0 days    On average, how many minutes do you engage in exercise at this level?: 0 min  Stress: No Stress Concern Present (06/30/2023)   Received from [facility] - Occupational Stress Questionnaire    Feeling of Stress : Only a little  Social Connections: Moderately Isolated (06/30/2023)   Received from [facility]   Social Connection and Isolation Panel    In a typical week, how many times do you talk on the phone with family, friends, or neighbors?: More than three times a week    How often do you get together with friends or relatives?: More than three times a week    How often do you attend church or religious services?: More than 4 times per year    Do you belong to any clubs or  organizations such as church groups, unions, fraternal or athletic groups, or school groups?: No    How often do you attend meetings of the clubs  or organizations you belong to?: Never    Are you married, widowed, divorced, separated, never married, or living with a partner?: Widowed     Family History:  The patient's family history includes Breast cancer in her maternal aunt; Celiac disease in her brother; Dementia in her mother; Heart attack in her father; Liver cancer in her sister.  ROS:   12-point review of systems is negative unless otherwise noted in the HPI.   EKGs/Labs/Other Studies Reviewed:    Studies reviewed were summarized above. The additional studies were reviewed today:  2D echo 02/26/2024: 1. Left ventricular ejection fraction, by estimation, is 55 to 60%. The  left ventricle has normal function. The left ventricle has no regional  wall motion abnormalities. There is mild left ventricular hypertrophy.  Left ventricular diastolic parameters  are consistent with Grade I diastolic dysfunction (impaired relaxation).   2. Right ventricular systolic function is normal. The right ventricular  size is normal.   3. The mitral valve is normal in structure. Mild mitral valve  regurgitation.   4. The aortic valve is tricuspid. Aortic valve regurgitation is not  visualized.   5. The inferior vena cava is normal in size with greater than 50%  respiratory variability, suggesting right atrial pressure of 3 mmHg.  __________  Myocardial PET/CT 11/11/2023:   The study is normal. The study is low risk.   LV perfusion is normal. There is no evidence of ischemia. There is no evidence of infarction.   Rest left ventricular function is normal. Stress left ventricular function is normal. End diastolic cavity size is normal. End systolic cavity size is normal.   Myocardial blood flow was computed to be 0.45ml/g/min at rest and 1.13ml/g/min at stress. Global myocardial blood flow reserve was  2.00 and was normal.   Coronary calcium  was present on the attenuation correction CT images. Moderate coronary calcifications were present. Coronary calcifications were present in the left anterior descending artery and right coronary artery distribution(s). __________   2D echo 05/01/2022: 1. Left ventricular ejection fraction, by estimation, is 65 to 70%. The  left ventricle has normal function. Left ventricular endocardial border  not optimally defined to evaluate regional wall motion. There is moderate  left ventricular hypertrophy. Left  ventricular diastolic parameters are consistent with Grade I diastolic  dysfunction (impaired relaxation). There is turbulent flow in the left  ventricular outflow tract suggestive of possible LVOT obstruction though  significant gradient is not  demonstrated by pulsed wave Doppler.   2. Right ventricular systolic function is normal. The right ventricular  size is normal. Tricuspid regurgitation signal is inadequate for assessing  PA pressure.   3. The mitral valve is normal in structure. Trivial mitral valve  regurgitation.   4. The aortic valve is tricuspid. Aortic valve regurgitation is not  visualized. No aortic stenosis is present.   5. The inferior vena cava is normal in size with greater than 50%  respiratory variability, suggesting right atrial pressure of 3 mmHg.   Comparison(s): EF 55%, mild LVH. __________   [facility] 03/22/2022:   Prox RCA lesion is 20% stenosed.   Mid RCA lesion is 30% stenosed.   Ost Cx to Prox Cx lesion is 20% stenosed.   The left ventricular systolic function is normal.   LV end diastolic pressure is normal.   The left ventricular ejection fraction is 55-65% by visual estimate.   1.  Mild nonobstructive coronary artery disease. 2.  Normal LV systolic function and normal left ventricular end-diastolic  pressure.   Recommendations: No culprit is identified for unstable angina.  Continue medical therapy. __________    Lexiscan  MPI 09/15/2020: Pharmacological myocardial perfusion imaging study with no significant  Ischemia Small mild defect in the apical region, likely attenuation artifact  Normal wall motion, EF estimated at 62% No EKG changes concerning for ischemia at peak stress or in recovery. CT attenuation correction images with coronary calcification in the LAD, very mild in the aorta Low risk scan __________   2D echo 09/15/2020: 1. Left ventricular ejection fraction, by estimation, is 55%. The left  ventricle has normal function. Anteroseptal wall motion abnormality,  likely secondary to conduction abnormailty, bundle branch block. There is  mild left ventricular hypertrophy. Left   ventricular diastolic parameters are consistent with Grade I diastolic  dysfunction (impaired relaxation).   2. Right ventricular systolic function is normal. The right ventricular  size is normal.   3. The mitral valve is normal in structure. Mild mitral valve  regurgitation.   4. Challenging images, definity  used __________   2D echo 07/27/2013: - Left ventricle: The cavity size was normal. There was mild    concentric hypertrophy. Systolic function was normal. The    estimated ejection fraction was in the range of 50% to    55%. Wall motion was normal; there were no regional wall    motion abnormalities. Doppler parameters are consistent    with abnormal left ventricular relaxation (grade 1    diastolic dysfunction).  - Ventricular septum: Septal motion showed abnormal    function. These changes are consistent with a left bundle    branch block.  - Mitral valve: Mild regurgitation.  - Tricuspid valve: Mild regurgitation.  - Pulmonary arteries: Systolic pressure was within the    normal range.   EKG:  EKG is not ordered today.    Recent Labs: 02/16/2024: BNP 13.7; BUN 31; Creatinine, Ser 1.28; Hemoglobin 12.6; Platelets 208; Potassium 4.6; Sodium 140  Recent Lipid Panel    Component Value  Date/Time   CHOL 122 09/14/2020 1738   TRIG 151 (H) 09/14/2020 1738   HDL 49 09/14/2020 1738   CHOLHDL 2.5 09/14/2020 1738   VLDL 30 09/14/2020 1738   LDLCALC 43 09/14/2020 1738    PHYSICAL EXAM:    VS:  BP (!) 120/54 (BP Location: Left Arm, Patient Position: Sitting, Cuff Size: Normal)   Pulse 80   Ht 5' 10 (1.778 m)   Wt 188 lb 3.2 oz (85.4 kg)   SpO2 98%   BMI 27.00 kg/m   BMI: Body mass index is 27 kg/m.  Physical Exam Constitutional:      Appearance: She is well-developed.  HENT:     Head: Normocephalic and atraumatic.  Eyes:     General:        Right eye: No discharge.        Left eye: No discharge.  Cardiovascular:     Rate and Rhythm: Normal rate and regular rhythm.     Heart sounds: Normal heart sounds, S1 normal and S2 normal. Heart sounds not distant. No midsystolic click and no opening snap. No murmur heard.    No friction rub.  Pulmonary:     Effort: Pulmonary effort is normal. No respiratory distress.     Breath sounds: Normal breath sounds. No decreased breath sounds, wheezing, rhonchi or rales.  Chest:     Chest wall: No tenderness.  Musculoskeletal:     Cervical back: Normal range of motion.  Right lower leg: Edema present.     Left lower leg: Edema present.     Comments: Mild bilateral pretibial edema with the right being greater than the left with mild erythema along the anterior aspect of the right lower extremity.  Skin:    General: Skin is warm and dry.     Nails: There is no clubbing.  Neurological:     Mental Status: She is alert and oriented to person, place, and time.  Psychiatric:        Speech: Speech normal.        Behavior: Behavior normal.        Thought Content: Thought content normal.        Judgment: Judgment normal.     Wt Readings from Last 3 Encounters:  04/19/24 188 lb 3.2 oz (85.4 kg)  02/16/24 187 lb 6.4 oz (85 kg)  11/18/23 190 lb (86.2 kg)     ASSESSMENT & PLAN:   Nonobstructive CAD: No symptoms suggestive  of angina or cardiac decompensation.  LHC in 03/2022 showed nonobstructive disease as outlined above. Myocardial PET/CT in 11/2023 showed no evidence of ischemia or infarction and was low risk.  Continue aggressive risk factor modification and primary prevention including aspirin , carvedilol , Imdur , and rosuvastatin .  HTN: Blood pressure is well controlled in the office today.  She remains on carvedilol  25 mg twice daily, Imdur  60 mg, and olmesartan 40 mg.  Recent renal function and potassium stable.  Aortic atherosclerosis/HLD/hypertriglyceridemia: LDL 35 in 12/2023.  She remains on rosuvastatin  10 mg.  LBBB: No symptoms suggestive of syncope. Prior echo with preserved LV systolic function and normal LV systolic function with stress testing in 11/2023   Carotid artery disease: 65% stenosis in the proximal right ICA with 50% stenosis in the proximal left ICA noted on CTA head neck in 03/2023. At that time, there was also moderate stenosis in the proximal posterior inferior cerebral arteries bilaterally.  Would recommend follow-up imaging through PCP.  She remains on aspirin  and rosuvastatin .  Venous stasis dermatitis: Improving.  Remains on torsemide 10 mg daily with stable labs.  Recommend compression socks and leg elevation.  Ongoing management per PCP.    Disposition: F/u with Dr. Darron or an APP in 6 months.   Medication Adjustments/Labs and Tests Ordered: Current medicines are reviewed at length with the patient today.  Concerns regarding medicines are outlined above. Medication changes, Labs and Tests ordered today are summarized above and listed in the Patient Instructions accessible in Encounters.   Signed, Bernardino Bring, PA-C 04/19/2024 4:23 PM     [facility] [place] [address] [place], [place] [address] [phone]

## 2024-04-19 NOTE — Patient Instructions (Signed)
 Medication Instructions:  Your physician recommends that you continue on your current medications as directed. Please refer to the Current Medication list given to you today.   *If you need a refill on your cardiac medications before your next appointment, please call your pharmacy*  Lab Work: None ordered at this time  If you have labs (blood work) drawn today and your tests are completely normal, you will receive your results only by: MyChart Message (if you have MyChart) OR A paper copy in the mail If you have any lab test that is abnormal or we need to change your treatment, we will call you to review the results.  Testing/Procedures: None ordered at this time   Follow-Up: At [facility], you and your health needs are our priority.  As part of our continuing mission to provide you with exceptional heart care, our providers are all part of one team.  This team includes your primary Cardiologist (physician) and Advanced Practice Providers or APPs (Physician Assistants and Nurse Practitioners) who all work together to provide you with the care you need, when you need it.  Your next appointment:   6 month(s)  Provider:   You may see Deatrice Cage, MD or Bernardino Bring, PA-C

## 2024-05-06 DIAGNOSIS — N3946 Mixed incontinence: Secondary | ICD-10-CM | POA: Diagnosis not present

## 2024-05-31 DIAGNOSIS — E08621 Diabetes mellitus due to underlying condition with foot ulcer: Secondary | ICD-10-CM | POA: Diagnosis not present

## 2024-05-31 DIAGNOSIS — F1120 Opioid dependence, uncomplicated: Secondary | ICD-10-CM | POA: Diagnosis not present

## 2024-05-31 DIAGNOSIS — L0390 Cellulitis, unspecified: Secondary | ICD-10-CM | POA: Diagnosis not present

## 2024-05-31 DIAGNOSIS — E0842 Diabetes mellitus due to underlying condition with diabetic polyneuropathy: Secondary | ICD-10-CM | POA: Diagnosis not present

## 2024-05-31 DIAGNOSIS — M79671 Pain in right foot: Secondary | ICD-10-CM | POA: Diagnosis not present

## 2024-05-31 DIAGNOSIS — G894 Chronic pain syndrome: Secondary | ICD-10-CM | POA: Diagnosis not present

## 2024-05-31 DIAGNOSIS — Z79891 Long term (current) use of opiate analgesic: Secondary | ICD-10-CM | POA: Diagnosis not present

## 2024-05-31 DIAGNOSIS — I10 Essential (primary) hypertension: Secondary | ICD-10-CM | POA: Diagnosis not present

## 2024-06-15 DIAGNOSIS — E1140 Type 2 diabetes mellitus with diabetic neuropathy, unspecified: Secondary | ICD-10-CM | POA: Diagnosis not present

## 2024-06-15 DIAGNOSIS — E538 Deficiency of other specified B group vitamins: Secondary | ICD-10-CM | POA: Diagnosis not present

## 2024-06-21 ENCOUNTER — Ambulatory Visit: Admitting: Urology

## 2024-06-21 VITALS — BP 99/56 | HR 76 | Ht 70.0 in | Wt 183.6 lb

## 2024-06-21 DIAGNOSIS — R32 Unspecified urinary incontinence: Secondary | ICD-10-CM | POA: Diagnosis not present

## 2024-06-21 DIAGNOSIS — N3946 Mixed incontinence: Secondary | ICD-10-CM

## 2024-06-21 DIAGNOSIS — R3989 Other symptoms and signs involving the genitourinary system: Secondary | ICD-10-CM

## 2024-06-21 DIAGNOSIS — N3080 Other cystitis without hematuria: Secondary | ICD-10-CM | POA: Diagnosis not present

## 2024-06-21 LAB — URINALYSIS, COMPLETE
Bilirubin, UA: NEGATIVE
Glucose, UA: NEGATIVE
Nitrite, UA: NEGATIVE
RBC, UA: NEGATIVE
Specific Gravity, UA: 1.03 (ref 1.005–1.030)
Urobilinogen, Ur: 0.2 mg/dL (ref 0.2–1.0)
pH, UA: 5.5 (ref 5.0–7.5)

## 2024-06-21 LAB — MICROSCOPIC EXAMINATION: Epithelial Cells (non renal): >10 /HPF — AB (ref 0–10)

## 2024-06-21 MED ORDER — GEMTESA 75 MG PO TABS: 75.0000 mg | ORAL_TABLET | Freq: Every day | ORAL | 11 refills | Status: AC

## 2024-06-21 MED ORDER — GEMTESA 75 MG PO TABS: 75.0000 mg | ORAL_TABLET | Freq: Every day | ORAL | Status: AC

## 2024-06-21 MED ORDER — NITROFURANTOIN MONOHYD MACRO 100 MG PO CAPS: 100.0000 mg | ORAL_CAPSULE | Freq: Two times a day (BID) | ORAL | 0 refills | Status: AC

## 2024-06-21 NOTE — Progress Notes (Signed)
 06/21/2024 2:13 PM   [name] May 22, 1946 [id number]  Referring provider: Cleotilde Oneil FALCON, MD [phone] [facility] [address] [facility] [place],  [place] [address]  No chief complaint on file.   HPI: I was consulted to assess the patient as urinary incontinence.  She leaks with bending lifting coughing sneezing.  She has urge incontinence.  She says the main symptom is when she leans and leaks.  She has varying moderately severe bedwetting.  She wears 4 pads a day moderately wet   Her flow is often slow.  She will feel empty.  She can stand up and gush.  She can hesitated at times.  Sometimes she strains to start urination   She has not had a hysterectomy.  She is on Ozempic.    On pelvic examination she had mild hypermobility the bladder neck and negative cough test with modest cough. She had a high grade 2 cystocele with mild central defect. Uterus was reasonably well supported distended approximately 2 cm. She had a moderately sized distal shiny grade 2 rectocele.  She has suburethral swelling with redundant tissue but in my opinion does not have a diverticulum  Patient has mixed incontinence.  She also has bedwetting.  She has flow symptoms.  She will return for urodynamics and cystoscopy and proceed accordingly.  Picture was drawn.  Sometimes I think she does feel the rectocele.  Call if culture positive   Patient has strong smelling urine and this was discussed.  Her daughter is a Engineer, civil (consulting) and will drive her to [facility] for the test  Today Frequency stable.  Incontinence stable.  No recent culture During urodynamics patient did not void and was catheterized for a few milliliters.  Maximum bladder capacity is 573 mL.  Bladder was stable.  Cuff leak point pressure 300 mL was 32 cm water with moderate to severe leakage.  Her Valsalva leak point pressure is 14 cm of water with moderate to severe leakage.  Cuff leak point pressure 600 mL was 25 cm of water with moderate to  severe leakage.  Valsalva leak point pressure was 24 cm of water with moderate to severe leakage at the same volume.  During voiding the patient voided 500 mL.  Maximal flow was 10 mL/s.  Was interrupted prolonged pattern.  Maximum voiding pressure was approximately 60 once there is water.  She may have been straining a little bit.  Residual was 73 mL.  EMG activity within normal limits.  Bladder neck distended approximately 1 cm or less.  The details of the urodynamics are signed and dictated  Patient said that her urine is quite yellow but does not necessarily get a lot of bladder infections.  Physical exam unchanged.  Leaked mildly after cystoscopy with mild rotational descent of the urethra Cystoscopy: Patient underwent flexible cystoscopy.  Urine was very cloudy.  There were some white flecks.  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  No carcinoma. Urinalysis positive and sent for culture  Patient said she does not leak as much when she coughs or sneeze but can leaks walking and sitting in a chair.  She can leak without awareness as well.   PMH: Past Medical History:  Diagnosis Date   Chest pain    a. reports multiple nl stress tests and nl cath in 2008.   Diabetes mellitus without complication (HCC)    History of echocardiogram    a. 07/2013 Echo: EF 50-55%. No rwma. Gr1 DD. Abnl septal motion  consistent w/ LBBB. Mild MR/TR.   Hyperlipidemia    Hypertension    LBBB (left bundle branch block)     Surgical History: Past Surgical History:  Procedure Laterality Date   endovascular laser N/A    LEFT HEART CATH AND CORONARY ANGIOGRAPHY N/A 03/22/2022   Procedure: LEFT HEART CATH AND CORONARY ANGIOGRAPHY;  Surgeon: Darron Deatrice LABOR, MD;  Location: [facility] INVASIVE CV LAB;  Service: Cardiovascular;  Laterality: N/A;    Home Medications:  Allergies as of 06/21/2024   No Known Allergies      Medication List        Accurate as of June 21, 2024  2:13 PM. If you have any  questions, ask your nurse or doctor.          aspirin  EC 81 MG tablet Take 81 mg by mouth daily. Swallow whole.   carvedilol  25 MG tablet Commonly known as: COREG  Take 1 tablet (25 mg total) by mouth 2 (two) times daily.   cyanocobalamin  1000 MCG/ML injection Commonly known as: VITAMIN B12 Inject into the muscle.   diclofenac Sodium 1 % Gel Commonly known as: VOLTAREN Apply 2 g topically in the morning, at noon, in the evening, and at bedtime.   etodolac 400 MG tablet Commonly known as: LODINE Take 400 mg by mouth daily.   gabapentin  300 MG capsule Commonly known as: NEURONTIN  Take 300 mg by mouth 3 (three) times daily.   glimepiride 4 MG tablet Commonly known as: AMARYL Take 4 mg by mouth daily with breakfast.   isosorbide  mononitrate 60 MG 24 hr tablet Commonly known as: IMDUR  TAKE 1 TABLET BY MOUTH DAILY   metFORMIN 500 MG 24 hr tablet Commonly known as: GLUCOPHAGE-XR Take 1,000 mg by mouth every evening.   nitroGLYCERIN  0.4 MG SL tablet Commonly known as: Nitrostat  Place 1 tablet (0.4 mg total) under the tongue every 5 (five) minutes as needed for chest pain (no more than 3 tablets in a 15 minute period).   olmesartan 40 MG tablet Commonly known as: BENICAR Take 40 mg by mouth daily.   omeprazole 20 MG capsule Commonly known as: PRILOSEC Take 20 mg by mouth daily.   oxyCODONE  5 MG immediate release tablet Commonly known as: Oxy IR/ROXICODONE  Take 5 mg by mouth every 8 (eight) hours as needed.   Ozempic (0.25 or 0.5 MG/DOSE) 2 MG/3ML Sopn Generic drug: Semaglutide(0.25 or 0.5MG /DOS) as directed.   rosuvastatin  10 MG tablet Commonly known as: CRESTOR  Take 10 mg by mouth daily.   sertraline 50 MG tablet Commonly known as: ZOLOFT Take 25 mg by mouth daily.   torsemide 20 MG tablet Commonly known as: DEMADEX Take 20 mg by mouth.   triamcinolone  cream 0.1 % Commonly known as: KENALOG  Apply 1 application topically 2 (two) times daily.    Vitamin D3 25 MCG (1000 UT) Caps Take 1,000 Units by mouth daily.        Allergies: No Known Allergies  Family History: Family History  Problem Relation Age of Onset   Liver cancer Sister        died @ 25   Breast cancer Maternal Aunt    Dementia Mother        died @ 32   Heart attack Father        died @ 40   Celiac disease Brother        alive @ 23    Social History:  reports that she quit smoking about 5 years ago. Her smoking use  included cigarettes. She started smoking about 45 years ago. She has a 20 pack-year smoking history. She has never used smokeless tobacco. She reports that she does not drink alcohol and does not use drugs.  ROS:                                        Physical Exam: There were no vitals taken for this visit.  Constitutional:  Alert and oriented, No acute distress. HEENT: [place] AT, moist mucus membranes.  Trachea midline, no masses.   Laboratory Data: Lab Results  Component Value Date   WBC 5.7 02/16/2024   HGB 12.6 02/16/2024   HCT 38.7 02/16/2024   MCV 92 02/16/2024   PLT 208 02/16/2024    Lab Results  Component Value Date   CREATININE 1.28 (H) 02/16/2024    No results found for: PSA  No results found for: TESTOSTERONE  Lab Results  Component Value Date   HGBA1C 8.4 (H) 09/14/2020    Urinalysis    Component Value Date/Time   COLORURINE STRAW (A) 03/19/2023 1509   APPEARANCEUR Slightly cloudy 03/22/2024 1328   LABSPEC 1.006 03/19/2023 1509   PHURINE 5.0 03/19/2023 1509   GLUCOSEU Negative 03/22/2024 1328   HGBUR NEGATIVE 03/19/2023 1509   BILIRUBINUR Negative 03/22/2024 1328   KETONESUR NEGATIVE 03/19/2023 1509   PROTEINUR Trace 03/22/2024 1328   PROTEINUR NEGATIVE 03/19/2023 1509   NITRITE Negative 03/22/2024 1328   NITRITE NEGATIVE 03/19/2023 1509   LEUKOCYTESUR Negative 03/22/2024 1328   LEUKOCYTESUR NEGATIVE 03/19/2023 1509    Pertinent Imaging: Urine reviewed and sent for  culture  Assessment & Plan: Patient appears to have significant stress incontinence.  I am suspect she is getting UTIs.  I called and Macrodantin  100 mg twice a day for 7 days.  I put her on Gemtesa  samples and prescription and likely will try to overactive bladder medications before discussing a sling versus bulking agent.  If she wishes to have a sling and/or her rectocele repaired I would refer her.  She does have bedwetting as well which likely could persist or worsen after stress incontinence surgery.  Patient agreed with the plan to try to medicines  1. Urinary incontinence, unspecified type (Primary)  - Urinalysis, Complete   No follow-ups on file.  Glendia DELENA Elizabeth, MD  [facility] [address], [address] [place], [place] [address] [phone]

## 2024-06-22 DIAGNOSIS — Z79899 Other long term (current) drug therapy: Secondary | ICD-10-CM | POA: Diagnosis not present

## 2024-06-22 DIAGNOSIS — E1140 Type 2 diabetes mellitus with diabetic neuropathy, unspecified: Secondary | ICD-10-CM | POA: Diagnosis not present

## 2024-06-25 LAB — CULTURE, URINE COMPREHENSIVE

## 2024-08-02 DIAGNOSIS — G894 Chronic pain syndrome: Secondary | ICD-10-CM | POA: Diagnosis not present

## 2024-08-02 DIAGNOSIS — F1120 Opioid dependence, uncomplicated: Secondary | ICD-10-CM | POA: Diagnosis not present

## 2024-08-02 DIAGNOSIS — E0842 Diabetes mellitus due to underlying condition with diabetic polyneuropathy: Secondary | ICD-10-CM | POA: Diagnosis not present

## 2024-08-02 DIAGNOSIS — I10 Essential (primary) hypertension: Secondary | ICD-10-CM | POA: Diagnosis not present

## 2024-08-02 DIAGNOSIS — Z79891 Long term (current) use of opiate analgesic: Secondary | ICD-10-CM | POA: Diagnosis not present

## 2024-08-02 DIAGNOSIS — E08621 Diabetes mellitus due to underlying condition with foot ulcer: Secondary | ICD-10-CM | POA: Diagnosis not present

## 2024-08-02 DIAGNOSIS — M79671 Pain in right foot: Secondary | ICD-10-CM | POA: Diagnosis not present

## 2024-08-02 DIAGNOSIS — L0390 Cellulitis, unspecified: Secondary | ICD-10-CM | POA: Diagnosis not present

## 2024-11-24 ENCOUNTER — Ambulatory Visit: Admitting: Physician Assistant
